# Patient Record
Sex: Male | Born: 1935 | Race: White | Hispanic: No | Marital: Married | Smoking: Former smoker
Health system: Southern US, Community
[De-identification: ages and names within clinical notes are randomized; demographics above are authoritative.]

## PROBLEM LIST (undated history)

## (undated) DIAGNOSIS — G43909 Migraine, unspecified, not intractable, without status migrainosus: Secondary | ICD-10-CM

## (undated) DIAGNOSIS — I350 Nonrheumatic aortic (valve) stenosis: Secondary | ICD-10-CM

## (undated) DIAGNOSIS — I4719 Other supraventricular tachycardia: Secondary | ICD-10-CM

## (undated) DIAGNOSIS — K219 Gastro-esophageal reflux disease without esophagitis: Secondary | ICD-10-CM

## (undated) DIAGNOSIS — I472 Ventricular tachycardia: Secondary | ICD-10-CM

## (undated) DIAGNOSIS — F32A Depression, unspecified: Secondary | ICD-10-CM

## (undated) DIAGNOSIS — I10 Essential (primary) hypertension: Secondary | ICD-10-CM

## (undated) DIAGNOSIS — I701 Atherosclerosis of renal artery: Secondary | ICD-10-CM

## (undated) DIAGNOSIS — N529 Male erectile dysfunction, unspecified: Secondary | ICD-10-CM

## (undated) DIAGNOSIS — M79606 Pain in leg, unspecified: Secondary | ICD-10-CM

## (undated) DIAGNOSIS — E1140 Type 2 diabetes mellitus with diabetic neuropathy, unspecified: Secondary | ICD-10-CM

## (undated) DIAGNOSIS — F329 Major depressive disorder, single episode, unspecified: Secondary | ICD-10-CM

## (undated) DIAGNOSIS — F419 Anxiety disorder, unspecified: Secondary | ICD-10-CM

## (undated) DIAGNOSIS — I4510 Unspecified right bundle-branch block: Secondary | ICD-10-CM

## (undated) DIAGNOSIS — Z8719 Personal history of other diseases of the digestive system: Secondary | ICD-10-CM

## (undated) DIAGNOSIS — I4729 Other ventricular tachycardia: Secondary | ICD-10-CM

## (undated) DIAGNOSIS — J449 Chronic obstructive pulmonary disease, unspecified: Secondary | ICD-10-CM

## (undated) DIAGNOSIS — F319 Bipolar disorder, unspecified: Secondary | ICD-10-CM

## (undated) DIAGNOSIS — I471 Supraventricular tachycardia: Secondary | ICD-10-CM

## (undated) DIAGNOSIS — E039 Hypothyroidism, unspecified: Secondary | ICD-10-CM

## (undated) DIAGNOSIS — I5032 Chronic diastolic (congestive) heart failure: Secondary | ICD-10-CM

## (undated) DIAGNOSIS — E785 Hyperlipidemia, unspecified: Secondary | ICD-10-CM

## (undated) DIAGNOSIS — I739 Peripheral vascular disease, unspecified: Secondary | ICD-10-CM

## (undated) DIAGNOSIS — M1990 Unspecified osteoarthritis, unspecified site: Secondary | ICD-10-CM

## (undated) DIAGNOSIS — N183 Chronic kidney disease, stage 3 unspecified: Secondary | ICD-10-CM

## (undated) DIAGNOSIS — I872 Venous insufficiency (chronic) (peripheral): Secondary | ICD-10-CM

## (undated) HISTORY — PX: BLEPHAROPLASTY: SUR158

## (undated) HISTORY — DX: Hyperlipidemia, unspecified: E78.5

## (undated) HISTORY — DX: Male erectile dysfunction, unspecified: N52.9

## (undated) HISTORY — PX: CATARACT EXTRACTION, BILATERAL: SHX1313

## (undated) HISTORY — PX: RENAL ARTERY STENT: SHX2321

## (undated) HISTORY — DX: Essential (primary) hypertension: I10

## (undated) HISTORY — DX: Peripheral vascular disease, unspecified: I73.9

## (undated) HISTORY — DX: Major depressive disorder, single episode, unspecified: F32.9

## (undated) HISTORY — DX: Chronic obstructive pulmonary disease, unspecified: J44.9

## (undated) HISTORY — DX: Depression, unspecified: F32.A

## (undated) HISTORY — DX: Gastro-esophageal reflux disease without esophagitis: K21.9

## (undated) HISTORY — DX: Pain in leg, unspecified: M79.606

## (undated) HISTORY — PX: TONSILLECTOMY: SUR1361

---

## 1987-12-20 HISTORY — PX: KIDNEY STONE SURGERY: SHX686

## 1998-05-28 ENCOUNTER — Ambulatory Visit: Admission: RE | Admit: 1998-05-28 | Discharge: 1998-05-28 | Payer: Self-pay | Admitting: Internal Medicine

## 2002-03-21 ENCOUNTER — Encounter: Admission: RE | Admit: 2002-03-21 | Discharge: 2002-03-21 | Payer: Self-pay | Admitting: Internal Medicine

## 2002-03-21 ENCOUNTER — Encounter: Payer: Self-pay | Admitting: Internal Medicine

## 2003-04-01 ENCOUNTER — Encounter: Admission: RE | Admit: 2003-04-01 | Discharge: 2003-04-01 | Payer: Self-pay | Admitting: Internal Medicine

## 2003-04-01 ENCOUNTER — Encounter: Payer: Self-pay | Admitting: Internal Medicine

## 2005-05-26 ENCOUNTER — Encounter: Admission: RE | Admit: 2005-05-26 | Discharge: 2005-05-26 | Payer: Self-pay | Admitting: Internal Medicine

## 2005-06-14 ENCOUNTER — Encounter: Admission: RE | Admit: 2005-06-14 | Discharge: 2005-06-14 | Payer: Self-pay | Admitting: Internal Medicine

## 2005-06-23 ENCOUNTER — Ambulatory Visit (HOSPITAL_COMMUNITY): Admission: RE | Admit: 2005-06-23 | Discharge: 2005-06-24 | Payer: Self-pay | Admitting: Internal Medicine

## 2005-10-14 ENCOUNTER — Ambulatory Visit: Payer: Self-pay | Admitting: Internal Medicine

## 2005-11-02 ENCOUNTER — Ambulatory Visit: Payer: Self-pay | Admitting: Internal Medicine

## 2005-11-02 ENCOUNTER — Encounter (INDEPENDENT_AMBULATORY_CARE_PROVIDER_SITE_OTHER): Payer: Self-pay | Admitting: *Deleted

## 2005-11-02 DIAGNOSIS — D126 Benign neoplasm of colon, unspecified: Secondary | ICD-10-CM

## 2005-11-02 DIAGNOSIS — K219 Gastro-esophageal reflux disease without esophagitis: Secondary | ICD-10-CM | POA: Insufficient documentation

## 2005-11-02 DIAGNOSIS — K5730 Diverticulosis of large intestine without perforation or abscess without bleeding: Secondary | ICD-10-CM | POA: Insufficient documentation

## 2005-11-02 DIAGNOSIS — K6389 Other specified diseases of intestine: Secondary | ICD-10-CM | POA: Insufficient documentation

## 2005-11-22 ENCOUNTER — Ambulatory Visit: Payer: Self-pay | Admitting: Internal Medicine

## 2005-11-22 ENCOUNTER — Ambulatory Visit (HOSPITAL_COMMUNITY): Admission: RE | Admit: 2005-11-22 | Discharge: 2005-11-22 | Payer: Self-pay | Admitting: Internal Medicine

## 2006-04-04 ENCOUNTER — Encounter: Admission: RE | Admit: 2006-04-04 | Discharge: 2006-04-04 | Payer: Self-pay | Admitting: Interventional Radiology

## 2006-08-10 ENCOUNTER — Encounter: Admission: RE | Admit: 2006-08-10 | Discharge: 2006-08-10 | Payer: Self-pay | Admitting: Interventional Radiology

## 2007-02-28 ENCOUNTER — Encounter: Admission: RE | Admit: 2007-02-28 | Discharge: 2007-02-28 | Payer: Self-pay | Admitting: Interventional Radiology

## 2007-07-11 DIAGNOSIS — I7090 Unspecified atherosclerosis: Secondary | ICD-10-CM | POA: Insufficient documentation

## 2007-07-11 DIAGNOSIS — N269 Renal sclerosis, unspecified: Secondary | ICD-10-CM

## 2007-07-11 DIAGNOSIS — K4020 Bilateral inguinal hernia, without obstruction or gangrene, not specified as recurrent: Secondary | ICD-10-CM | POA: Insufficient documentation

## 2007-07-11 DIAGNOSIS — K8020 Calculus of gallbladder without cholecystitis without obstruction: Secondary | ICD-10-CM | POA: Insufficient documentation

## 2007-07-11 DIAGNOSIS — K429 Umbilical hernia without obstruction or gangrene: Secondary | ICD-10-CM | POA: Insufficient documentation

## 2007-07-24 ENCOUNTER — Ambulatory Visit: Payer: Self-pay | Admitting: Internal Medicine

## 2007-08-03 ENCOUNTER — Ambulatory Visit (HOSPITAL_COMMUNITY): Admission: RE | Admit: 2007-08-03 | Discharge: 2007-08-03 | Payer: Self-pay | Admitting: Internal Medicine

## 2007-08-15 ENCOUNTER — Ambulatory Visit: Payer: Self-pay | Admitting: Internal Medicine

## 2007-10-03 ENCOUNTER — Ambulatory Visit: Payer: Self-pay | Admitting: Internal Medicine

## 2008-02-21 DIAGNOSIS — K2289 Other specified disease of esophagus: Secondary | ICD-10-CM | POA: Insufficient documentation

## 2008-02-21 DIAGNOSIS — G4730 Sleep apnea, unspecified: Secondary | ICD-10-CM | POA: Insufficient documentation

## 2008-02-21 DIAGNOSIS — J449 Chronic obstructive pulmonary disease, unspecified: Secondary | ICD-10-CM

## 2008-02-21 DIAGNOSIS — I00 Rheumatic fever without heart involvement: Secondary | ICD-10-CM | POA: Insufficient documentation

## 2008-02-21 DIAGNOSIS — K228 Other specified diseases of esophagus: Secondary | ICD-10-CM

## 2008-02-21 DIAGNOSIS — F341 Dysthymic disorder: Secondary | ICD-10-CM

## 2008-02-21 DIAGNOSIS — I110 Hypertensive heart disease with heart failure: Secondary | ICD-10-CM

## 2008-02-21 DIAGNOSIS — K224 Dyskinesia of esophagus: Secondary | ICD-10-CM

## 2008-02-21 DIAGNOSIS — E785 Hyperlipidemia, unspecified: Secondary | ICD-10-CM

## 2008-02-21 DIAGNOSIS — E039 Hypothyroidism, unspecified: Secondary | ICD-10-CM

## 2008-03-11 ENCOUNTER — Encounter: Admission: RE | Admit: 2008-03-11 | Discharge: 2008-03-11 | Payer: Self-pay | Admitting: Interventional Radiology

## 2009-03-31 ENCOUNTER — Ambulatory Visit: Payer: Self-pay | Admitting: *Deleted

## 2010-07-09 ENCOUNTER — Encounter: Admission: RE | Admit: 2010-07-09 | Discharge: 2010-07-09 | Payer: Self-pay | Admitting: Specialist

## 2010-07-09 ENCOUNTER — Ambulatory Visit (HOSPITAL_COMMUNITY): Admission: RE | Admit: 2010-07-09 | Discharge: 2010-07-09 | Payer: Self-pay | Admitting: Specialist

## 2010-09-23 ENCOUNTER — Encounter: Payer: Self-pay | Admitting: Internal Medicine

## 2010-10-04 ENCOUNTER — Ambulatory Visit (HOSPITAL_BASED_OUTPATIENT_CLINIC_OR_DEPARTMENT_OTHER): Admission: RE | Admit: 2010-10-04 | Discharge: 2010-10-04 | Payer: Self-pay | Admitting: Specialist

## 2011-01-08 ENCOUNTER — Encounter: Payer: Self-pay | Admitting: Orthopedic Surgery

## 2011-01-20 NOTE — Letter (Signed)
Summary: Colonoscopy Letter  [facility]  [address] [place], [place] [address]   Phone: [phone]  Fax: [phone]      September 23, 2010 MRN: [id number]   [name] [address] [place], [place]  [address]   Dear [name],   According to your medical record, it is time for you to schedule a Colonoscopy. The American Cancer Society recommends this procedure as a method to detect early colon cancer. Patients with a family history of colon cancer, or a personal history of colon polyps or inflammatory bowel disease are at increased risk.  This letter has been generated based on the recommendations made at the time of your procedure. If you feel that in your particular situation this may no longer apply, please contact our office.  Please call our office at [phone] to schedule this appointment or to update your records at your earliest convenience.  Thank you for cooperating with [place] to provide you with the very best care possible.   Sincerely,  Wilhemina Bonito. Marina Goodell, M.D.  [facility] [phone]

## 2011-02-22 ENCOUNTER — Encounter (INDEPENDENT_AMBULATORY_CARE_PROVIDER_SITE_OTHER): Payer: Medicare Other | Admitting: Vascular Surgery

## 2011-02-22 DIAGNOSIS — I70219 Atherosclerosis of native arteries of extremities with intermittent claudication, unspecified extremity: Secondary | ICD-10-CM

## 2011-02-23 NOTE — Consult Note (Signed)
NEW PATIENT CONSULTATION  [name], [name] DOB:  16-Jul-1929                                       02/22/2011 NGEXB#:[id number]  The patient presents today for evaluation of lower extremity arterial insufficiency.  He is an 76 year old gentleman with a long history of progressive lower extremity arterial insufficiency.  He reports that this is mainly in his calves.  He does have significant respiratory symptoms with shortness of breath with exertion.  He reports that his breathing difficulty becomes manifest about the same time as his lower extremity walking problems.  He, unfortunately, continues to smoke one pack of cigarettes per day.  He does not have any tissue loss in his lower extremities.  He does have history of prior renal artery stenting.  PAST MEDICAL HISTORY:  Significant for hypertension, COPD, hypothyroidism.  FAMILY HISTORY:  Negative for premature atherosclerotic disease.  SOCIAL HISTORY:  He does smoke a pack cigarettes a day but he does not drink alcohol.  He is married with one child.  He is retired.  REVIEW OF SYSTEMS:  CONSTITUTIONAL:  No weight loss.  He weighs 212 pounds.  He is 6 feet tall. VASCULAR:  Positive for pain in his legs with walking and lying flat. CARDIAC: Shortness of breath with exertion. GI:  Reflux, constipation. NEUROLOGIC:  Dizziness. PULMONARY:  Productive cough and wheezing. ENT:  Diminished hearing. PSYCHIATRIC:  Depression. SKIN:  Notable for rashes on his face.  PHYSICAL EXAMINATION:  A well-developed, well-nourished white male appearing stated age, in no acute distress.  Blood pressure is 132/78, pulse 82, respirations 12.  HEENT is normal.  Chest:  Clear bilaterally without wheezes.  He does have 2+ radial pulses.  He does have palpable femoral pulses and absent popliteal and distal pulses.  Abdomen: Moderately obese.  No masses are noted.  Musculoskeletal:  No major deformity or cyanosis.  Neurologic:   No focal weakness or paresthesias. Skin:  Without ulcers or rashes.  I did review his noninvasive vascular laboratory studies with him, which show moderate stenoses at 0.6 bilaterally.  I discussed this at length with the patient and his wife present.  I explained that this is not limb-threatening.  I do not feel that his resting pain and feet burning sensation is related to arterial insufficiency.  He does have a __________ disease and appears to be he is limited by his breathing as he is his claudication; therefore, we would recommend no active treatment.  I plan to see him again on an as-needed basis.  He will notify us should he develop any new difficulties.    Larina Earthly, M.D. Electronically Signed  TFE/MEDQ  D:  02/22/2011  T:  02/23/2011  Job:  [id number]  cc:   Gwen Pounds, MD

## 2011-03-02 LAB — DIFFERENTIAL
Basophils Absolute: 0 10*3/uL (ref 0.0–0.1)
Basophils Relative: 0 % (ref 0–1)
Eosinophils Absolute: 0.4 10*3/uL (ref 0.0–0.7)
Eosinophils Relative: 4 % (ref 0–5)
Lymphocytes Relative: 21 % (ref 12–46)
Lymphs Abs: 1.9 10*3/uL (ref 0.7–4.0)
Monocytes Absolute: 0.6 10*3/uL (ref 0.1–1.0)
Monocytes Relative: 7 % (ref 3–12)
Neutro Abs: 6.4 10*3/uL (ref 1.7–7.7)
Neutrophils Relative %: 68 % (ref 43–77)

## 2011-03-02 LAB — BASIC METABOLIC PANEL
BUN: 16 mg/dL (ref 6–23)
CO2: 26 mEq/L (ref 19–32)
Calcium: 8.9 mg/dL (ref 8.4–10.5)
Chloride: 108 mEq/L (ref 96–112)
Creatinine, Ser: 1.53 mg/dL — ABNORMAL HIGH (ref 0.4–1.5)
GFR calc Af Amer: 53 mL/min — ABNORMAL LOW (ref >60)
GFR calc non Af Amer: 44 mL/min — ABNORMAL LOW (ref >60)
Glucose, Bld: 124 mg/dL — ABNORMAL HIGH (ref 70–99)
Potassium: 3.9 mEq/L (ref 3.5–5.1)
Sodium: 141 mEq/L (ref 135–145)

## 2011-03-02 LAB — CBC
HCT: 42.5 % (ref 39.0–52.0)
MCHC: 34.6 g/dL (ref 30.0–36.0)
WBC: 9.3 10*3/uL (ref 4.0–10.5)

## 2011-03-05 LAB — BASIC METABOLIC PANEL
BUN: 23 mg/dL (ref 6–23)
CO2: 29 mEq/L (ref 19–32)
Calcium: 9.8 mg/dL (ref 8.4–10.5)
Chloride: 103 mEq/L (ref 96–112)
Creatinine, Ser: 1.61 mg/dL — ABNORMAL HIGH (ref 0.4–1.5)
Glucose, Bld: 108 mg/dL — ABNORMAL HIGH (ref 70–99)

## 2011-03-05 LAB — DIFFERENTIAL
Monocytes Absolute: 1.1 10*3/uL — ABNORMAL HIGH (ref 0.1–1.0)
Monocytes Relative: 8 % (ref 3–12)
Neutro Abs: 10.1 10*3/uL — ABNORMAL HIGH (ref 1.7–7.7)

## 2011-03-05 LAB — CBC
Hemoglobin: 16.3 g/dL (ref 13.0–17.0)
MCH: 31.1 pg (ref 26.0–34.0)
RBC: 5.24 MIL/uL (ref 4.22–5.81)

## 2011-05-03 NOTE — Assessment & Plan Note (Signed)
[facility]                         GASTROENTEROLOGY OFFICE NOTE   NAME:[name], [name]                    MRN:          [id number]  DATE:10/03/2007                            DOB:          28-Feb-1929    HISTORY:  [name] presents today for followup.  He is a 77 year old  white male with a history of hypertension, hypothyroidism, dyslipidemia,  sleep apnea, gastroesophageal reflux disease complicated by peptic  stricture, and esophageal diverticula.  He was evaluated in the office  July 24, 2007 regarding dysphagia, weight loss and an abnormal CT scan.  See that dictation for details.  He underwent upper endoscopy and was  found to have two large epiphrenic diverticula at 38 cm from the  incisors.  These were impacted with considerable food debris.  At 42 cm  from the incisors was a 14 to 15 mm peptic stricture.  He also had a  small sliding hiatal hernia.  The remainder of his endoscopy was normal.  His distal stricture was subsequently dilated with an 18-mm Savary  dilator over a guidewire.  For symptoms of heartburn and indigestion, he  is on Aciphex.  He presents today for followup, as recommended.  Since  his dilation two months ago, he reports very little improvement in his  dysphagia.  Aciphex controls his heartburn and indigestion.  He requests  samples of Aciphex as well as a flu vaccine.  The patient has not had an  esophageal motility study or barium swallow.   CURRENT MEDICATIONS:  1. Aciphex.  2. Synthroid.  3. Carbidopa/levadopa.  4. Seroquel.  5. Klonopin.  6. Remeron.  7. B vitamin.  8. Co-enzyme Q10.  9. Multivitamin.  10.Omega-3.  11.Simvastatin.  12.Zoloft.  13.Baby aspirin.   PHYSICAL EXAMINATION:  A well-appearing male in no acute distress.  Blood pressure is 104/66, heart rate is initially 104 and regular,  repeat 90 and regular.  His weight is 191.8 pounds (no change).  The abdomen was not reexamined.   IMPRESSION:  1. Chronic dysphagia, multifactorial, including large epiphrenic      diverticula, distal esophageal stricture, and undefined motility      disorder.  Minimal response to 18-mm dilation.  2. Gastroesophageal reflux disease with heartburn, indigestion, and      peptic stricture.  Symptoms, other than dysphagia, controlled with      Aciphex.  3. Multiple general medical problems.   RECOMMENDATIONS:  At this point I would like to refer [name] to an  esophagologist, Dr. Alvia Grove, to further define the nature of  his problem and recommend treatment.  In particular, I imagine this  patient should have motility study to absolutely exclude achalasia or if  not feasible,an esophagram.  If achalasia found, then he could be  considered for pneumatic dilation of the distal esophagus.  However, if  not, then possible referral for surgical resection of the large  diverticula.  In any event, I look forward to Dr. Bryson Dames assessment  and impressions.  The patient states that he would like to wait until  the first of the year for this appointment.  This  is certainly  reasonable.We might perform the esophagram prior to the evaluation.  As  such, he will continue on Aciphex, additional samples have been  provided.  We will provide the influenza vaccine as requested.  He will  resume his general medical care with Dr. Timothy Lasso.     Wilhemina Bonito. Marina Goodell, MD  Electronically Signed    JNP/MedQ  DD: 10/03/2007  DT: 10/04/2007  Job #: [id number]   cc:   Gwen Pounds, MD  Ronda Fairly. Enrigue Catena, MD

## 2011-05-03 NOTE — Procedures (Signed)
DUPLEX DEEP VENOUS EXAM - LOWER EXTREMITY   INDICATION:  Edema, rule out DVT.   HISTORY:  Edema:  Right lower extremity swelling for over 1 year.  Trauma/Surgery:  No  Pain:  No  PE:  No  Previous DVT:  No  Anticoagulants:  Other:   DUPLEX EXAM:                CFV   SFV   PopV  PTV    GSV                R  L  R  L  R  L  R   L  R  L  Thrombosis    0  0  0     0     0      0  Spontaneous   +  +  +     +     +      +  Phasic        +  +  +     +     +      +  Augmentation  +  +  +     +     +      +  Compressible  +  +  +     +     +      +  Competent     0  0  0     0            +   Legend:  + - yes  o - no  p - partial  D - decreased   IMPRESSION:  1. No evidence of deep venous thrombosis noted in the right lower      extremity.  2. Deep venous reflux is noted, as described above.  3. A preliminary report was faxed to Dr. Ferd Hibbs office on March 31, 2009.    _____________________________  P. Liliane Bade, M.D.   CH/MEDQ  D:  03/31/2009  T:  03/31/2009  Job:  [id number]

## 2011-05-03 NOTE — Assessment & Plan Note (Signed)
[facility]                         GASTROENTEROLOGY OFFICE NOTE   [name]                    MRN:          [id number]  DATE:07/24/2007                            DOB:          October 27, 1929    REFERRING PHYSICIAN:  Gwen Pounds, MD   REASON FOR CONSULTATION:  Dysphagia, weight loss and abnormal CT scan.   HISTORY:  This is a 77 year old white male with a history of sleep  apnea, hypertension, hypothyroidism, dyslipidemia, anxiety with  depression, esophageal stricture, esophageal diverticulum, and kidney  stones.  He is referred through the courtesy of Dr. Timothy Lasso regarding  weight loss and dysphagia.  The patient was evaluated in this office in  October 2006 for dysphagia.  He subsequently underwent upper endoscopy  and was found to have a wide-mouthed traction diverticulum of the distal  esophagus, also a peptic stricture of the esophagus, esophagitis and  duodenitis.  He was negative for H. pylori.  He was placed on AcipHex  and subsequently set up for upper endoscopy with esophageal dilation to  a maximal diameter of 18 mm.  He cannot recall whether the dilation was  helpful.  He has had chronic problems with anorexia and has lost 45  pounds over the past 2 years.  His dysphagia is chronic, somewhat  worsening.  He recently had pneumonia and was treated with Augmentin.  His stools were generally chronically constipated, though since  antibiotic therapy, have been a bit loose; he welcomes the change.  Because of ongoing weight loss, a CT scan of the chest, abdomen and  pelvis was obtained July 11, 2007.  The significant findings were that  of circumferential esophageal wall thickening as well as retained  contrast in the esophagus, also mild adenopathy and gastric wall  thickening with luminal narrowing, also changes in the right lung  consistent with aspiration pneumonia and gallstones, also a small  umbilical hernia.  He is referred.   The patient denies GI bleeding or  abdominal pain.   PAST MEDICAL HISTORY:  As above.   PAST SURGICAL HISTORY:  Left renal stent placement.   ALLERGIES:  No known drug allergies.   CURRENT MEDICATIONS:  1. AcipHex 20 mg daily.  2. Synthroid 112 mcg daily.  3. Carbidopa/levadopa 10/100 mg at night.  4. Seroquel 200 mg at night and 50 mg during the day.  5. Klonopin 1 mg to 3 mg daily.  6. Remeron 45 mg at night.  7. Vitamin B.  8. Multivitamin.  9. Omega-3.  10.Vytorin 10 mg daily.  11.Vitamin C.  12.Glucosamine.  13.Vitamin D  14.Zoloft 50 mg daily.  15.Oxycodone at midnight.   FAMILY HISTORY:  Negative for gastrointestinal malignancy.   SOCIAL HISTORY:  As previously outlined, no change.   PHYSICAL EXAMINATION:  Elderly male in no acute distress.  Blood pressure is 90/72. Heart rate is 100 and regular.  Weight is 191.6  pounds (decreased 45 pounds since last visit).  HEENT:  Sclerae are anicteric.  Conjunctivae are pink.  Oral mucosa is  intact.  There is no obvious adenopathy.  LUNGS:  Clear.  HEART:  Regular.  ABDOMEN:  Soft without tenderness, mass or hernia.  Good bowel sounds  heard.  EXTREMITIES:  Without edema.   IMPRESSION:  1. Problems with dysphagia, likely multifactorial including      dysmotility, esophageal diverticulum, and distal stricture.  2. Gastroesophageal reflux disease.  3. Weight loss, likely the effect of anorexia and difficulty      swallowing.  4. Multiple general medical problems.   RECOMMENDATIONS:  1. Continue proton pump inhibitor therapy.  Multiple samples of      AcipHex have been given at their request.  2. Schedule upper endoscopy with esophageal dilation.  The nature of      the procedure as well as the risks, benefits and alternatives have      been reviewed.  They understood and agreed to proceed.  3. Ongoing general medical care with Dr. Timothy Lasso.     Wilhemina Bonito. Marina Goodell, MD  Electronically Signed    JNP/MedQ  DD:  07/24/2007  DT: 07/25/2007  Job #: [id number]

## 2011-05-23 ENCOUNTER — Encounter (HOSPITAL_BASED_OUTPATIENT_CLINIC_OR_DEPARTMENT_OTHER): Payer: Medicare Other | Attending: Internal Medicine

## 2011-05-23 DIAGNOSIS — K219 Gastro-esophageal reflux disease without esophagitis: Secondary | ICD-10-CM | POA: Insufficient documentation

## 2011-05-23 DIAGNOSIS — Z7982 Long term (current) use of aspirin: Secondary | ICD-10-CM | POA: Insufficient documentation

## 2011-05-23 DIAGNOSIS — J4489 Other specified chronic obstructive pulmonary disease: Secondary | ICD-10-CM | POA: Insufficient documentation

## 2011-05-23 DIAGNOSIS — L8992 Pressure ulcer of unspecified site, stage 2: Secondary | ICD-10-CM | POA: Insufficient documentation

## 2011-05-23 DIAGNOSIS — J449 Chronic obstructive pulmonary disease, unspecified: Secondary | ICD-10-CM | POA: Insufficient documentation

## 2011-05-23 DIAGNOSIS — E669 Obesity, unspecified: Secondary | ICD-10-CM | POA: Insufficient documentation

## 2011-05-23 DIAGNOSIS — Z79899 Other long term (current) drug therapy: Secondary | ICD-10-CM | POA: Insufficient documentation

## 2011-05-23 DIAGNOSIS — E785 Hyperlipidemia, unspecified: Secondary | ICD-10-CM | POA: Insufficient documentation

## 2011-05-23 DIAGNOSIS — G2581 Restless legs syndrome: Secondary | ICD-10-CM | POA: Insufficient documentation

## 2011-05-23 DIAGNOSIS — L89309 Pressure ulcer of unspecified buttock, unspecified stage: Secondary | ICD-10-CM | POA: Insufficient documentation

## 2011-05-23 DIAGNOSIS — F17200 Nicotine dependence, unspecified, uncomplicated: Secondary | ICD-10-CM | POA: Insufficient documentation

## 2011-05-24 NOTE — Assessment & Plan Note (Signed)
[facility]  NAME:  [name], MOTTERN NO.:  [id number]  MEDICAL RECORD NO.:  [id number]      DATE OF BIRTH:  February 03, 1929  PHYSICIAN:  Jonelle Sports. Sevier, M.D.  VISIT DATE:  05/23/2011                                  OFFICE VISIT   HISTORY:  This 77 year old white male previously known to me who comes, referred by Dr. Honor Loh, dermatologist, for assistance with management of stage II pressure ulcers on both buttocks.  The patient has a history of squamous cell carcinoma in the past and some other actinic skin changes, but nothing any deeper consequence.  He is obese and somewhat phlegmatic by nature but is fully ambulatory. Apparently, he is in the habit of sitting for extended periods of time, watching television, and things of that nature and he is also a smoker.  Without background history, he developed several weeks ago painful lesions on each side of his buttock in the parasacral area.  He had seen a dermatologist for these and had been treated with desonide and also nystatin ointments.  He has experienced little relief.  He does have a donut cushion on which he apparently has been sitting at home.  PAST MEDICAL HISTORY:  No hospitalizations, surgery only for his squamous cell cancer which was on the chest wall.  Other hospitalizations none.  ALLERGIES:  None known.  REGULAR MEDICATIONS:  Aspirin 81 mg a day, Zoloft, Klonopin, Seroquel, simvastatin, nystatin, Synthroid, melatonin, carbidopa, levodopa, and mirtazapine.  FAMILY HISTORY:  Not contributory.  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  He is physically relatively inactive and has been chronically obese.  REVIEW OF SYSTEMS:  The patient is a smoker as indicated above, smoking presently approximately 1/2-2/3rd pack of cigarettes per day and has chronic obstructive pulmonary disease.  He has had a  stent placed in 1 kidney because of partial blockage and also has a history of rheumatic fever with lots of edema of late and the degree of evaluation that is unclear to me.  He has restless legs syndrome, gastroesophageal reflux, tendency toward depression, hyperlipidemia, and he has been told that there is a 35% reduction in his peripheral circulation.  PHYSICAL EXAMINATION:  VITAL SIGNS:  Blood pressure is 110/71, pulse 78, respirations 20, temperature 97.9. GENERAL:  This is an obese slow reacting individual who appears perhaps a little younger than his stated age of 88. NECK:  His neck veins are flat.  His carotid pulses are without bruits. CHEST:  Clear. HEART:  Regular and I hear no definite murmur. ABDOMEN:  Obese but without organomegaly, masses, or tenderness. EXTREMITIES:  Showed extreme edema to the level above the knees.  He has no open skin lesions on his legs.  His pulses are not palpable through this edema. On his buttocks both sides adjacent to the sacral area at the beginning of the anal crease, he has a small lesions measuring approximately 1.5 x 0.8 cm on both sides with some surrounding erythema as well.  There is no evidence of gross infection.  IMPRESSION:  Buttock decubiti bilaterally in the gluteal fold, both stage II.  DISPOSITION: 1. It is discussed with the patient that he needs to reduce  or stop     the smoking. 2. It is recommended to go away with the donut cushion and get instead     an egg crate cushion or some equivalent there. 3. He is advised that he must stay off this area spending less time     sitting, should be up and about more or when he is down, he should     be on one side of the other.  The wounds, after cleansing today, are treated with an application of DuoDerm and the area more widely covered with Tegaderm to protect DuoDerm and keep it clean.  Followup visit will be here in 1 week.  He is advised to call us sooner should the  dressings come off or become significantly soiled.          ______________________________ Jonelle Sports Cheryll Cockayne, M.D.     RES/MEDQ  D:  05/23/2011  T:  05/24/2011  Job:  [id number]

## 2011-06-20 ENCOUNTER — Encounter (HOSPITAL_BASED_OUTPATIENT_CLINIC_OR_DEPARTMENT_OTHER): Payer: Medicare Other | Attending: Internal Medicine

## 2011-06-20 DIAGNOSIS — Z79899 Other long term (current) drug therapy: Secondary | ICD-10-CM | POA: Insufficient documentation

## 2011-06-20 DIAGNOSIS — J4489 Other specified chronic obstructive pulmonary disease: Secondary | ICD-10-CM | POA: Insufficient documentation

## 2011-06-20 DIAGNOSIS — K219 Gastro-esophageal reflux disease without esophagitis: Secondary | ICD-10-CM | POA: Insufficient documentation

## 2011-06-20 DIAGNOSIS — E669 Obesity, unspecified: Secondary | ICD-10-CM | POA: Insufficient documentation

## 2011-06-20 DIAGNOSIS — F17200 Nicotine dependence, unspecified, uncomplicated: Secondary | ICD-10-CM | POA: Insufficient documentation

## 2011-06-20 DIAGNOSIS — J449 Chronic obstructive pulmonary disease, unspecified: Secondary | ICD-10-CM | POA: Insufficient documentation

## 2011-06-20 DIAGNOSIS — E785 Hyperlipidemia, unspecified: Secondary | ICD-10-CM | POA: Insufficient documentation

## 2011-06-20 DIAGNOSIS — G2581 Restless legs syndrome: Secondary | ICD-10-CM | POA: Insufficient documentation

## 2011-06-20 DIAGNOSIS — L8992 Pressure ulcer of unspecified site, stage 2: Secondary | ICD-10-CM | POA: Insufficient documentation

## 2011-06-20 DIAGNOSIS — Z7982 Long term (current) use of aspirin: Secondary | ICD-10-CM | POA: Insufficient documentation

## 2011-06-20 DIAGNOSIS — L89309 Pressure ulcer of unspecified buttock, unspecified stage: Secondary | ICD-10-CM | POA: Insufficient documentation

## 2011-11-07 ENCOUNTER — Encounter: Payer: Self-pay | Admitting: Vascular Surgery

## 2011-11-08 ENCOUNTER — Other Ambulatory Visit: Payer: Self-pay

## 2011-11-08 DIAGNOSIS — I739 Peripheral vascular disease, unspecified: Secondary | ICD-10-CM

## 2011-11-21 ENCOUNTER — Encounter: Payer: Self-pay | Admitting: Vascular Surgery

## 2011-11-22 ENCOUNTER — Ambulatory Visit (INDEPENDENT_AMBULATORY_CARE_PROVIDER_SITE_OTHER): Payer: Medicare Other | Admitting: Vascular Surgery

## 2011-11-22 ENCOUNTER — Encounter: Payer: Self-pay | Admitting: Vascular Surgery

## 2011-11-22 ENCOUNTER — Other Ambulatory Visit (INDEPENDENT_AMBULATORY_CARE_PROVIDER_SITE_OTHER): Payer: Medicare Other | Admitting: *Deleted

## 2011-11-22 VITALS — BP 144/84 | HR 89 | Resp 16 | Ht 72.0 in | Wt 223.7 lb

## 2011-11-22 DIAGNOSIS — I739 Peripheral vascular disease, unspecified: Secondary | ICD-10-CM

## 2011-11-22 DIAGNOSIS — L98499 Non-pressure chronic ulcer of skin of other sites with unspecified severity: Secondary | ICD-10-CM

## 2011-11-22 NOTE — Progress Notes (Signed)
The patient is an 77 year old gentleman well known to our practice with a long history of peripheral vascular occlusive disease. He has not required any intervention this was stable claudication. He is slow down the last several years and does not walk to the level of claudication. He does have significant COPD and therefore is more limited to this and his lower extremity discomfort.  He has developed a thickened callus on the plantar surface of his right heel and has had several treatments with shaving of this area. We are sitting in today for further evaluation of his level of arterial insufficiency. He denies any rest pain and currently denies any claudication.  Past Medical History  Diagnosis Date  . Hyperlipidemia   . COPD (chronic obstructive pulmonary disease)   . Leg pain   . GERD (gastroesophageal reflux disease)   . Depression   . Dizziness   . Productive cough   . Thyroid disease   . Peripheral vascular disease   . Chronic kidney disease   . Hypertension   . ED (erectile dysfunction)     History  Substance Use Topics  . Smoking status: Current Everyday Smoker -- 1.0 packs/day for 65 years    Types: Cigarettes  . Smokeless tobacco: Not on file  . Alcohol Use: No    Family History  Problem Relation Age of Onset  . Other Mother     AAA  . Heart attack Father   . Alcohol abuse Father   . Diabetes Brother   . Coronary artery disease Brother   . Dementia Brother     No Known Allergies  Current outpatient prescriptions:aspirin EC 81 MG tablet, Take 81 mg by mouth daily.  , Disp: , Rfl: ;  carbidopa-levodopa (SINEMET) 10-100 MG per tablet, Take 1 tablet by mouth daily.  , Disp: , Rfl: ;  clonazePAM (KLONOPIN) 1 MG tablet, Take 0.5 mg by mouth 2 (two) times daily as needed. 1/2 tablet in AM and 2 tablets in PM., Disp: , Rfl: ;  levothyroxine (SYNTHROID, LEVOTHROID) 112 MCG tablet, Take 112 mcg by mouth daily.  , Disp: , Rfl:  Melatonin 3 MG CAPS, Take 3 mg by mouth daily.   , Disp: , Rfl: ;  mirtazapine (REMERON) 30 MG tablet, Take 30 mg by mouth at bedtime.  , Disp: , Rfl: ;  QUEtiapine (SEROQUEL) 25 MG tablet, Take 25 mg by mouth at bedtime.  , Disp: , Rfl: ;  sertraline (ZOLOFT) 100 MG tablet, Take 100 mg by mouth daily.  , Disp: , Rfl: ;  sildenafil (VIAGRA) 100 MG tablet, Take 100 mg by mouth daily as needed.  , Disp: , Rfl:  simvastatin (ZOCOR) 40 MG tablet, Take 40 mg by mouth at bedtime.  , Disp: , Rfl:   BP 144/84  Pulse 89  Resp 16  Ht 6' (1.829 m)  Wt 223 lb 11.2 oz (101.47 kg)  BMI 30.34 kg/m2  Body mass index is 30.34 kg/(m^2).       Review of systems: Shortness of breath with exertion, leg pain, productive cough and wheezing, history of depression otherwise negative  Physical exam: Well-developed well-nourished white male no acute distress HEENT normal. Carotid arteries a bruits bilaterally. Radial pulses 2+ bilaterally chest clear bilaterally. Palpable femoral pulses bilaterally. Absent popliteal and distal pulses bilaterally. He has no tissue loss of his lower Shoney's. Does have a thickened callus on the plantar aspect of his right heel.  Vascular lab: Biphasic waveforms bilaterally. Ankle arm index of  0.77 on the right and 0.64 on the left.  Impression and plan:Stable moderate lower extremity i arterial insufficiency, asymptomatic. Would recommend continued observation only. I feel that he has adequate flow for any treatment required regarding his right heel.

## 2011-12-07 ENCOUNTER — Other Ambulatory Visit: Payer: Medicare Other

## 2011-12-07 ENCOUNTER — Encounter: Payer: Medicare Other | Admitting: Vascular Surgery

## 2012-04-26 ENCOUNTER — Encounter (HOSPITAL_BASED_OUTPATIENT_CLINIC_OR_DEPARTMENT_OTHER): Payer: Medicare Other | Attending: Internal Medicine

## 2012-04-26 DIAGNOSIS — R21 Rash and other nonspecific skin eruption: Secondary | ICD-10-CM | POA: Insufficient documentation

## 2012-04-26 DIAGNOSIS — J449 Chronic obstructive pulmonary disease, unspecified: Secondary | ICD-10-CM | POA: Insufficient documentation

## 2012-04-26 DIAGNOSIS — Z79899 Other long term (current) drug therapy: Secondary | ICD-10-CM | POA: Insufficient documentation

## 2012-04-26 DIAGNOSIS — I10 Essential (primary) hypertension: Secondary | ICD-10-CM | POA: Insufficient documentation

## 2012-04-26 DIAGNOSIS — E039 Hypothyroidism, unspecified: Secondary | ICD-10-CM | POA: Insufficient documentation

## 2012-04-26 DIAGNOSIS — I739 Peripheral vascular disease, unspecified: Secondary | ICD-10-CM | POA: Insufficient documentation

## 2012-04-26 DIAGNOSIS — L538 Other specified erythematous conditions: Secondary | ICD-10-CM | POA: Insufficient documentation

## 2012-04-26 DIAGNOSIS — J4489 Other specified chronic obstructive pulmonary disease: Secondary | ICD-10-CM | POA: Insufficient documentation

## 2012-04-26 NOTE — Progress Notes (Signed)
[facility]  NAME:  Bruce, [name] NO.:  [id number]  MEDICAL RECORD NO.:  [id number]      DATE OF BIRTH:  November 20, 1929  PHYSICIAN:  Ardath Sax, M.D.           VISIT DATE:                                  OFFICE VISIT   [name], has been here in the past for the same reason.  He returns now.  He is an 78 year old gentleman who has peripheral vascular disease.  He has got COPD.  He has hypothyroidism.  He is also on Seroquel, simvastatin, melatonin, Advair Diskus.  He takes Lasix.  He is also on prednisone 20 mg a day and propranolol and carbidopa and levodopa.  He enters the [facility] again because of his strange rash he has on his right buttock which is about 2 inches from the anal verge. He has been seen by a dermatologist on several occasions.  He has had this on and off for about 3 years.  It has been treated in many different ways.  It is even been cultured with no growth and it has been biopsied which did not show any pathology.  When I look at the rash, now it is about 3 or 4 cm in diameter.  It is not really an ulcer it is desquamated skin and erythema and it is certainly dry and erythematous bolus.  Since, the only thing he has not done is go to Infectious Disease, we may very will send him there.  In the meantime, since it resembles some sort of fungal dermatitis, I put him on Mycolog cream, and we will see him back here in a week and decide whether to send him back to the dermatologist which has been following him right along or get an Infectious Disease consult.  Otherwise, this gentleman is afebrile.  He is relatively strong for an 78 year old man with so many different problems that he has which are COPD, hypertension, hypothyroidism, peripheral vascular disease, and his temperature is 98.6, his blood pressure is 140/84, his pulse is 70.  We put dressing on with Mycolog and I wrote him a prescription for Mycolog and  we will see him in a week.     Ardath Sax, M.D.     PP/MEDQ  D:  04/26/2012  T:  04/26/2012  Job:  [id number]

## 2012-05-31 ENCOUNTER — Encounter (HOSPITAL_BASED_OUTPATIENT_CLINIC_OR_DEPARTMENT_OTHER): Payer: Medicare Other

## 2012-08-17 ENCOUNTER — Emergency Department (HOSPITAL_COMMUNITY): Payer: Medicare Other

## 2012-08-17 ENCOUNTER — Encounter (HOSPITAL_COMMUNITY): Payer: Self-pay | Admitting: Emergency Medicine

## 2012-08-17 ENCOUNTER — Inpatient Hospital Stay (HOSPITAL_COMMUNITY)
Admission: EM | Admit: 2012-08-17 | Discharge: 2012-08-23 | DRG: 177 | Disposition: A | Discharge status: Home Health | Payer: Medicare Other | Attending: Internal Medicine | Admitting: Internal Medicine

## 2012-08-17 DIAGNOSIS — L89109 Pressure ulcer of unspecified part of back, unspecified stage: Secondary | ICD-10-CM | POA: Diagnosis present

## 2012-08-17 DIAGNOSIS — G2581 Restless legs syndrome: Secondary | ICD-10-CM | POA: Diagnosis present

## 2012-08-17 DIAGNOSIS — R5381 Other malaise: Secondary | ICD-10-CM | POA: Diagnosis present

## 2012-08-17 DIAGNOSIS — J441 Chronic obstructive pulmonary disease with (acute) exacerbation: Secondary | ICD-10-CM | POA: Diagnosis present

## 2012-08-17 DIAGNOSIS — N183 Chronic kidney disease, stage 3 unspecified: Secondary | ICD-10-CM | POA: Diagnosis present

## 2012-08-17 DIAGNOSIS — E46 Unspecified protein-calorie malnutrition: Secondary | ICD-10-CM | POA: Diagnosis present

## 2012-08-17 DIAGNOSIS — G8929 Other chronic pain: Secondary | ICD-10-CM | POA: Diagnosis present

## 2012-08-17 DIAGNOSIS — F411 Generalized anxiety disorder: Secondary | ICD-10-CM | POA: Diagnosis present

## 2012-08-17 DIAGNOSIS — G929 Unspecified toxic encephalopathy: Secondary | ICD-10-CM | POA: Diagnosis present

## 2012-08-17 DIAGNOSIS — K219 Gastro-esophageal reflux disease without esophagitis: Secondary | ICD-10-CM | POA: Diagnosis present

## 2012-08-17 DIAGNOSIS — L89159 Pressure ulcer of sacral region, unspecified stage: Secondary | ICD-10-CM | POA: Diagnosis present

## 2012-08-17 DIAGNOSIS — I129 Hypertensive chronic kidney disease with stage 1 through stage 4 chronic kidney disease, or unspecified chronic kidney disease: Secondary | ICD-10-CM | POA: Diagnosis present

## 2012-08-17 DIAGNOSIS — R0902 Hypoxemia: Secondary | ICD-10-CM | POA: Diagnosis present

## 2012-08-17 DIAGNOSIS — E039 Hypothyroidism, unspecified: Secondary | ICD-10-CM | POA: Diagnosis present

## 2012-08-17 DIAGNOSIS — G4700 Insomnia, unspecified: Secondary | ICD-10-CM | POA: Diagnosis present

## 2012-08-17 DIAGNOSIS — F17200 Nicotine dependence, unspecified, uncomplicated: Secondary | ICD-10-CM | POA: Diagnosis present

## 2012-08-17 DIAGNOSIS — J690 Pneumonitis due to inhalation of food and vomit: Principal | ICD-10-CM | POA: Diagnosis present

## 2012-08-17 DIAGNOSIS — G92 Toxic encephalopathy: Secondary | ICD-10-CM | POA: Diagnosis present

## 2012-08-17 DIAGNOSIS — F419 Anxiety disorder, unspecified: Secondary | ICD-10-CM | POA: Diagnosis present

## 2012-08-17 DIAGNOSIS — L8990 Pressure ulcer of unspecified site, unspecified stage: Secondary | ICD-10-CM | POA: Diagnosis present

## 2012-08-17 DIAGNOSIS — I110 Hypertensive heart disease with heart failure: Secondary | ICD-10-CM | POA: Diagnosis present

## 2012-08-17 LAB — RAPID URINE DRUG SCREEN, HOSP PERFORMED
Cocaine: NOT DETECTED
Opiates: NOT DETECTED
Tetrahydrocannabinol: NOT DETECTED

## 2012-08-17 LAB — COMPREHENSIVE METABOLIC PANEL
BUN: 28 mg/dL — ABNORMAL HIGH (ref 6–23)
Calcium: 9.8 mg/dL (ref 8.4–10.5)
Creatinine, Ser: 1.52 mg/dL — ABNORMAL HIGH (ref 0.50–1.35)
GFR calc Af Amer: 48 mL/min — ABNORMAL LOW (ref >90)
GFR calc non Af Amer: 41 mL/min — ABNORMAL LOW (ref >90)
Glucose, Bld: 170 mg/dL — ABNORMAL HIGH (ref 70–99)
Sodium: 138 mEq/L (ref 135–145)
Total Protein: 6.7 g/dL (ref 6.0–8.3)

## 2012-08-17 LAB — ETHANOL: Alcohol, Ethyl (B): <11 mg/dL (ref 0–11)

## 2012-08-17 LAB — CBC WITH DIFFERENTIAL/PLATELET
Basophils Relative: 0 % (ref 0–1)
Eosinophils Absolute: 0 10*3/uL (ref 0.0–0.7)
Hemoglobin: 14.1 g/dL (ref 13.0–17.0)
Lymphs Abs: 1 10*3/uL (ref 0.7–4.0)
MCH: 29.3 pg (ref 26.0–34.0)
MCHC: 33.3 g/dL (ref 30.0–36.0)
MCV: 87.8 fL (ref 78.0–100.0)
Monocytes Absolute: 0.8 10*3/uL (ref 0.1–1.0)
Neutro Abs: 18.6 10*3/uL — ABNORMAL HIGH (ref 1.7–7.7)
Neutrophils Relative %: 91 % — ABNORMAL HIGH (ref 43–77)

## 2012-08-17 LAB — URINALYSIS, ROUTINE W REFLEX MICROSCOPIC
Leukocytes, UA: NEGATIVE
Nitrite: NEGATIVE
Specific Gravity, Urine: 1.029 (ref 1.005–1.030)
Urobilinogen, UA: 0.2 mg/dL (ref 0.0–1.0)
pH: 5.5 (ref 5.0–8.0)

## 2012-08-17 LAB — URINE MICROSCOPIC-ADD ON

## 2012-08-17 MED ORDER — ACETAMINOPHEN 325 MG PO TABS: 650.0000 mg | ORAL_TABLET | Freq: Four times a day (QID) | ORAL | Status: DC | PRN

## 2012-08-17 MED ORDER — ONDANSETRON HCL 4 MG PO TABS: 4.0000 mg | ORAL_TABLET | Freq: Four times a day (QID) | ORAL | Status: DC | PRN

## 2012-08-17 MED ORDER — SODIUM CHLORIDE 0.9 % IV SOLN
INTRAVENOUS | Status: DC
  Administered 2012-08-17: 23:00:00 via INTRAVENOUS

## 2012-08-17 MED ORDER — SODIUM CHLORIDE 0.9 % IJ SOLN
3.0000 mL | Freq: Two times a day (BID) | INTRAMUSCULAR | Status: DC
  Administered 2012-08-17 – 2012-08-23 (×8): 3 mL via INTRAVENOUS

## 2012-08-17 MED ORDER — SODIUM CHLORIDE 0.9 % IV SOLN
3.0000 g | Freq: Once | INTRAVENOUS | Status: AC
  Administered 2012-08-17: 3 g via INTRAVENOUS
  Filled 2012-08-17: qty 3

## 2012-08-17 MED ORDER — MELATONIN 3 MG PO CAPS: 3.0000 mg | ORAL_CAPSULE | Freq: Every day | ORAL | Status: DC

## 2012-08-17 MED ORDER — ENOXAPARIN SODIUM 40 MG/0.4ML ~~LOC~~ SOLN
40.0000 mg | SUBCUTANEOUS | Status: DC
  Administered 2012-08-17 – 2012-08-22 (×6): 40 mg via SUBCUTANEOUS
  Filled 2012-08-17 (×7): qty 0.4

## 2012-08-17 MED ORDER — SERTRALINE HCL 100 MG PO TABS
100.0000 mg | ORAL_TABLET | Freq: Every day | ORAL | Status: DC
  Administered 2012-08-17 – 2012-08-23 (×7): 100 mg via ORAL
  Filled 2012-08-17 (×7): qty 1

## 2012-08-17 MED ORDER — IPRATROPIUM BROMIDE 0.02 % IN SOLN: 0.5000 mg | Freq: Four times a day (QID) | RESPIRATORY_TRACT | Status: DC

## 2012-08-17 MED ORDER — GABAPENTIN 300 MG PO CAPS
300.0000 mg | ORAL_CAPSULE | Freq: Three times a day (TID) | ORAL | Status: DC
  Administered 2012-08-17 – 2012-08-23 (×17): 300 mg via ORAL
  Filled 2012-08-17 (×20): qty 1

## 2012-08-17 MED ORDER — PIPERACILLIN-TAZOBACTAM 3.375 G IVPB 30 MIN
3.3750 g | Freq: Four times a day (QID) | INTRAVENOUS | Status: DC
  Administered 2012-08-18 (×2): 3.375 g via INTRAVENOUS
  Filled 2012-08-17 (×3): qty 50

## 2012-08-17 MED ORDER — MIRTAZAPINE 30 MG PO TABS
30.0000 mg | ORAL_TABLET | Freq: Every day | ORAL | Status: DC
  Administered 2012-08-17 – 2012-08-22 (×6): 30 mg via ORAL
  Filled 2012-08-17 (×7): qty 1

## 2012-08-17 MED ORDER — ALBUTEROL SULFATE (5 MG/ML) 0.5% IN NEBU
2.5000 mg | INHALATION_SOLUTION | Freq: Four times a day (QID) | RESPIRATORY_TRACT | Status: DC | PRN
  Administered 2012-08-17: 2.5 mg via RESPIRATORY_TRACT
  Filled 2012-08-17: qty 0.5

## 2012-08-17 MED ORDER — ONDANSETRON HCL 4 MG/2ML IJ SOLN
4.0000 mg | Freq: Four times a day (QID) | INTRAMUSCULAR | Status: DC | PRN
  Administered 2012-08-18: 4 mg via INTRAVENOUS
  Filled 2012-08-17: qty 2

## 2012-08-17 MED ORDER — LEVOTHYROXINE SODIUM 112 MCG PO TABS
112.0000 ug | ORAL_TABLET | Freq: Every day | ORAL | Status: DC
  Administered 2012-08-17 – 2012-08-23 (×6): 112 ug via ORAL
  Filled 2012-08-17 (×8): qty 1

## 2012-08-17 MED ORDER — ASPIRIN EC 81 MG PO TBEC
81.0000 mg | DELAYED_RELEASE_TABLET | Freq: Every day | ORAL | Status: DC
  Administered 2012-08-18 – 2012-08-23 (×6): 81 mg via ORAL
  Filled 2012-08-17 (×6): qty 1

## 2012-08-17 MED ORDER — ALBUTEROL SULFATE (5 MG/ML) 0.5% IN NEBU: 2.5000 mg | INHALATION_SOLUTION | Freq: Four times a day (QID) | RESPIRATORY_TRACT | Status: DC

## 2012-08-17 MED ORDER — CLONAZEPAM 1 MG PO TABS
1.0000 mg | ORAL_TABLET | Freq: Two times a day (BID) | ORAL | Status: DC | PRN
  Administered 2012-08-17 – 2012-08-20 (×3): 1 mg via ORAL
  Filled 2012-08-17 (×3): qty 1

## 2012-08-17 MED ORDER — MORPHINE SULFATE 2 MG/ML IJ SOLN
2.0000 mg | INTRAMUSCULAR | Status: DC | PRN
  Administered 2012-08-17: 2 mg via INTRAVENOUS
  Filled 2012-08-17: qty 1

## 2012-08-17 MED ORDER — ALBUTEROL SULFATE (5 MG/ML) 0.5% IN NEBU
2.5000 mg | INHALATION_SOLUTION | Freq: Four times a day (QID) | RESPIRATORY_TRACT | Status: DC
  Administered 2012-08-18 – 2012-08-23 (×20): 2.5 mg via RESPIRATORY_TRACT
  Filled 2012-08-17 (×20): qty 0.5

## 2012-08-17 MED ORDER — CARBIDOPA-LEVODOPA 10-100 MG PO TABS
1.0000 | ORAL_TABLET | Freq: Every day | ORAL | Status: DC
  Administered 2012-08-17 – 2012-08-18 (×2): 1 via ORAL
  Filled 2012-08-17 (×3): qty 1

## 2012-08-17 MED ORDER — GUAIFENESIN ER 600 MG PO TB12
600.0000 mg | ORAL_TABLET | Freq: Two times a day (BID) | ORAL | Status: DC
  Administered 2012-08-17 – 2012-08-23 (×12): 600 mg via ORAL
  Filled 2012-08-17 (×13): qty 1

## 2012-08-17 MED ORDER — IPRATROPIUM BROMIDE 0.02 % IN SOLN
0.5000 mg | Freq: Four times a day (QID) | RESPIRATORY_TRACT | Status: DC
  Administered 2012-08-18 – 2012-08-23 (×20): 0.5 mg via RESPIRATORY_TRACT
  Filled 2012-08-17 (×20): qty 2.5

## 2012-08-17 MED ORDER — SIMVASTATIN 40 MG PO TABS
40.0000 mg | ORAL_TABLET | Freq: Every day | ORAL | Status: DC
  Administered 2012-08-17 – 2012-08-22 (×6): 40 mg via ORAL
  Filled 2012-08-17 (×7): qty 1

## 2012-08-17 MED ORDER — ALUM & MAG HYDROXIDE-SIMETH 200-200-20 MG/5ML PO SUSP: 30.0000 mL | Freq: Four times a day (QID) | ORAL | Status: DC | PRN

## 2012-08-17 MED ORDER — METHYLPREDNISOLONE SODIUM SUCC 125 MG IJ SOLR
60.0000 mg | Freq: Three times a day (TID) | INTRAMUSCULAR | Status: DC
  Administered 2012-08-17 – 2012-08-19 (×5): 60 mg via INTRAVENOUS
  Filled 2012-08-17 (×8): qty 0.96

## 2012-08-17 MED ORDER — QUETIAPINE FUMARATE 25 MG PO TABS
25.0000 mg | ORAL_TABLET | Freq: Every day | ORAL | Status: DC
  Administered 2012-08-17 – 2012-08-22 (×6): 25 mg via ORAL
  Filled 2012-08-17 (×7): qty 1

## 2012-08-17 MED ORDER — DOCUSATE SODIUM 100 MG PO CAPS
100.0000 mg | ORAL_CAPSULE | Freq: Two times a day (BID) | ORAL | Status: DC
  Administered 2012-08-17 – 2012-08-23 (×12): 100 mg via ORAL
  Filled 2012-08-17 (×13): qty 1

## 2012-08-17 MED ORDER — ACETAMINOPHEN 650 MG RE SUPP: 650.0000 mg | Freq: Four times a day (QID) | RECTAL | Status: DC | PRN

## 2012-08-17 NOTE — Progress Notes (Signed)
PHARMACIST - PHYSICIAN ORDER COMMUNICATION  CONCERNING: P&T Medication Policy on Herbal Medications  DESCRIPTION:  This patient's order for:  Melatonin  has been noted.  This product(s) is classified as an "herbal" or natural product. Due to a lack of definitive safety studies or FDA approval, nonstandard manufacturing practices, plus the potential risk of unknown drug-drug interactions while on inpatient medications, the Pharmacy and Therapeutics Committee does not permit the use of "herbal" or natural products of this type within [facility].   ACTION TAKEN: The pharmacy department is unable to verify this order at this time and your patient has been informed of this safety policy. Please reevaluate patient's clinical condition at discharge and address if the herbal or natural product(s) should be resumed at that time.  Geoffry Paradise, PharmD, BCPS Pager: [phone] 9:06 PM Pharmacy #: [phone]

## 2012-08-17 NOTE — ED Notes (Signed)
Unable to perform I-Stat, mini lab not available at this time.

## 2012-08-17 NOTE — ED Provider Notes (Signed)
History     CSN: [id number]  Arrival date & time 08/17/12  1312   First MD Initiated Contact with Patient 08/17/12 1507      Chief Complaint  Patient presents with  . Drug Overdose    (Consider location/radiation/quality/duration/timing/severity/associated sxs/prior treatment) Patient is a 78 y.o. male presenting with Overdose. The history is provided by the patient and the spouse.  Drug Overdose   Patient here with decreased mental status x3 days. Patient has been increasingly taking his Seroquel every 2 hours for his chronic pain. No history of suicidal attempt or ideations. Patient is also taking Capoten. According to his wife, he has been compliant with the other medications except for Seroquel which he is taking in excess. No recent intakes of aspirin or Tylenol. No recent falls. Patient is arousable but has been very sleepy Past Medical History  Diagnosis Date  . Hyperlipidemia   . COPD (chronic obstructive pulmonary disease)   . Leg pain   . GERD (gastroesophageal reflux disease)   . Depression   . Dizziness   . Productive cough   . Thyroid disease   . Peripheral vascular disease   . Chronic kidney disease   . Hypertension   . ED (erectile dysfunction)     Past Surgical History  Procedure Date  . Renal artery stent     left    Family History  Problem Relation Age of Onset  . Other Mother     AAA  . Heart attack Father   . Alcohol abuse Father   . Diabetes Brother   . Coronary artery disease Brother   . Dementia Brother     History  Substance Use Topics  . Smoking status: Current Everyday Smoker -- 1.0 packs/day for 65 years    Types: Cigarettes  . Smokeless tobacco: Not on file  . Alcohol Use: No      Review of Systems  All other systems reviewed and are negative.    Allergies  Review of patient's allergies indicates no known allergies.  Home Medications   Current Outpatient Rx  Name Route Sig Dispense Refill  . ASPIRIN EC 81 MG PO  TBEC Oral Take 81 mg by mouth daily.     Marland Kitchen CARBIDOPA-LEVODOPA 10-100 MG PO TABS Oral Take 1 tablet by mouth daily.     Marland Kitchen CLONAZEPAM 1 MG PO TABS Oral Take 1-2 mg by mouth 2 (two) times daily as needed. Take 1 tablet in morning and lunch and 2 tablets at bedtime.    Marland Kitchen GABAPENTIN 300 MG PO CAPS Oral Take 300 mg by mouth 3 (three) times daily.    Marland Kitchen LEVOTHYROXINE SODIUM 112 MCG PO TABS Oral Take 112 mcg by mouth daily.     Marland Kitchen MELATONIN 3 MG PO CAPS Oral Take 3 mg by mouth daily.      Marland Kitchen MIRTAZAPINE 30 MG PO TABS Oral Take 30 mg by mouth at bedtime.      Marland Kitchen QUETIAPINE FUMARATE 25 MG PO TABS Oral Take 25 mg by mouth at bedtime.     . SERTRALINE HCL 100 MG PO TABS Oral Take 100 mg by mouth daily.      Marland Kitchen SIMVASTATIN 40 MG PO TABS Oral Take 40 mg by mouth at bedtime.       BP 139/77  Pulse 87  Temp 100.3 F (37.9 C) (Oral)  Resp 20  Ht 6' (1.829 m)  Wt 212 lb (96.163 kg)  BMI 28.75 kg/m2  SpO2 93%  Physical  Exam  Nursing note and vitals reviewed. Constitutional: He is oriented to person, place, and time. He appears well-developed and well-nourished. He appears lethargic.  Non-toxic appearance. No distress.  HENT:  Head: Normocephalic and atraumatic.  Eyes: Conjunctivae, EOM and lids are normal. Pupils are equal, round, and reactive to light.  Neck: Normal range of motion. Neck supple. No tracheal deviation present. No mass present.  Cardiovascular: Normal rate, regular rhythm and normal heart sounds.  Exam reveals no gallop.   No murmur heard. Pulmonary/Chest: Effort normal. No stridor. No respiratory distress. He has decreased breath sounds. He has no wheezes. He has rhonchi. He has no rales.  Abdominal: Soft. Normal appearance and bowel sounds are normal. He exhibits no distension. There is no tenderness. There is no rebound and no CVA tenderness.  Musculoskeletal: Normal range of motion. He exhibits no edema and no tenderness.       3+ pitting edema bi- laterally  Neurological: He is  oriented to person, place, and time. He has normal strength. He appears lethargic. No cranial nerve deficit or sensory deficit. GCS eye subscore is 4. GCS verbal subscore is 5. GCS motor subscore is 6.  Skin: Skin is warm and dry. No abrasion and no rash noted.  Psychiatric: His affect is blunt. His speech is delayed. He is slowed. He expresses no suicidal plans and no homicidal plans.    ED Course  Procedures (including critical care time)  Labs Reviewed  CBC WITH DIFFERENTIAL - Abnormal; Notable for the following:    WBC 20.4 (*)     Neutrophils Relative 91 (*)     Lymphocytes Relative 5 (*)     Neutro Abs 18.6 (*)     All other components within normal limits  ETHANOL  ACETAMINOPHEN LEVEL  SALICYLATE LEVEL  URINE RAPID DRUG SCREEN (HOSP PERFORMED)  COMPREHENSIVE METABOLIC PANEL  URINALYSIS, ROUTINE W REFLEX MICROSCOPIC   No results found.   No diagnosis found.    MDM   Date: 08/17/2012  Rate: 89  Rhythm: normal sinus rhythm  QRS Axis: normal  Intervals: normal  ST/T Wave abnormalities: normal and nonspecific ST changes  Conduction Disutrbances:right bundle branch block  Narrative Interpretation:   Old EKG Reviewed: unchanged  4:56 PM Patient with likely aspiration pneumonia based on x-ray and has coarse breath sounds. Also has leukocytosis will be started on Unasyn and admitted to medicine        Toy Baker, MD 08/17/12 [id number]

## 2012-08-17 NOTE — H&P (Signed)
PCP:   Gwen Pounds, MD   Chief Complaint:  Confusion, cough  HPI: [name] is an 78 year old white male with a history of COPD, chronic pain secondary to sacral ulcer, and generalized anxiety who presented to the emergency department with increased confusion. Patient's wife states that he has been very agitated over the past week has been taking Seroquel every 2 hours to try to combat this. She states that this source of his agitation is his chronic sacral pain secondary to eczema. He has been taking extra Neurontin as well trying to combat this pain. In addition, she has noticed an increasing cough with sputum production. He was treated about 3 weeks ago with a course of prednisone and Cefdinir for COPD exacerbation. He initially improved until about 3 days ago when his cough returned associated with increased wheezing and shortness of breath. He called Dr. Timothy Lasso and was placed on Cefdinir again without prednisone this time. His cough has not improved since that time. Today, the patient was continuing to take his Seroquel and Neurontin in his wife noticed that he was becoming increasingly somnolent and confused. She brought him to the emergency department where he was found have a probable left lower lobe infiltrate, likely secondary to aspiration. Since arrival emergency department his mental status has cleared significantly but is not back to baseline.  Review of Systems:  Review of Systems - limited by patients confusion. All systems reviewed with patient and are negative except in history of present illness with the following exceptions: Chronic sacral ulcer felt secondary to eczema per dermatology. Treated topically and through the wound clinic over the past 4 years without improvement. Chronic swelling in his legs. Thirsty today.  Past Medical History: Past Medical History  Diagnosis Date  . Hyperlipidemia   . COPD (chronic obstructive pulmonary disease)   . Leg pain   . GERD  (gastroesophageal reflux disease)   . Depression   . Dizziness   . Productive cough   . Thyroid disease   . Peripheral vascular disease   . Chronic kidney disease   . ED (erectile dysfunction)   . Hypertension     08/17/12 - wife denies  Migraine HA,  Positive Syphilis test (congenital) s/p PCN,  Rheumatic fever,  PAD-RAS s/p PCI 7/06,  Peripheral neuropathy,  COPD--Smoker,  GERD complicated by peptic stricture/ esophageal diverticula, Hyperlipidemia, Hypothyroidism, HTN, Depression, HOH, CRI/CKD, RLS, ED, Rosacea, Sq Cell Ca of L Ear--Dr Emily Filbert.  Nephrolithiasis, sleep apnea, PAD,  02/2008 MMSE 29/30.  Sacral Decub 05/2011 Moderate PAD noted. ABI .6    Past Surgical History  Procedure Date  . Renal artery stent     left  Right eye cataract blepharochalasis  left sides> right sides, causing obstruction of his superior visual field areas as well as laterally.  Upper lid blepharoplasties bilateral surgery 10/04/10 Dr Shon Hough    Medications: Prior to Admission medications   Medication Sig Start Date End Date Taking? Authorizing Provider  aspirin EC 81 MG tablet Take 81 mg by mouth daily.    Yes Historical Provider, MD  carbidopa-levodopa (SINEMET) 10-100 MG per tablet Take 1 tablet by mouth daily.    Yes Historical Provider, MD  clonazePAM (KLONOPIN) 1 MG tablet Take 1-2 mg by mouth 2 (two) times daily as needed. Take 1 tablet in morning and lunch and 2 tablets at bedtime.   Yes Historical Provider, MD  gabapentin (NEURONTIN) 300 MG capsule Take 300 mg by mouth 3 (three) times daily.   Yes Historical  Provider, MD  levothyroxine (SYNTHROID, LEVOTHROID) 112 MCG tablet Take 112 mcg by mouth daily.    Yes Historical Provider, MD  Melatonin 3 MG CAPS Take 3 mg by mouth daily.     Yes Historical Provider, MD  mirtazapine (REMERON) 30 MG tablet Take 30 mg by mouth at bedtime.     Yes Historical Provider, MD  QUEtiapine (SEROQUEL) 25 MG tablet Take 25 mg by mouth at bedtime.    Yes Historical  Provider, MD  sertraline (ZOLOFT) 100 MG tablet Take 100 mg by mouth daily.     Yes Historical Provider, MD  simvastatin (ZOCOR) 40 MG tablet Take 40 mg by mouth at bedtime.    Yes Historical Provider, MD    Allergies:  No Known Allergies  Social History:  reports that he has been smoking Cigarettes.  He has a 65 pack-year smoking history. He has never used smokeless tobacco. He reports that he does not drink alcohol or use illicit drugs. Pt is married and has 3 children and one grandchild.  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  Pt smokes one ppd off and on since 15 years.    Family History: Family History  Problem Relation Age of Onset  . Other Mother     AAA  . Heart attack Father   . Alcohol abuse Father   . Diabetes Brother   . Coronary artery disease Brother   . Dementia Brother    Pt's father is deceased and had DM and was an alcoholic.  Mother is deceased and had an AAA.  Brother has DM, CAD, Dementia and PD  Physical Exam: Filed Vitals:   08/17/12 1313 08/17/12 1317 08/17/12 1329 08/17/12 1508  BP:   142/64 139/77  Pulse:  97 93 87  Temp:  98.9 F (37.2 C) 100.3 F (37.9 C)   TempSrc:   Oral   Resp:  24  20  Height:  6' (1.829 m)    Weight:  96.163 kg (212 lb)    SpO2: 78% 92% 93% 93%   General appearance: Drowsy, slightly disoriented but appropriate with answers and follows commands Head: Normocephalic, without obvious abnormality, atraumatic Eyes: conjunctivae/corneas clear. PERRL, EOM's intact.  Nose: Nares normal. Septum midline. Mucosa normal. No drainage or sinus tenderness. Throat: lips, mucosa, and tongue normal; teeth and gums normal Neck: no adenopathy, no carotid bruit, no JVD and thyroid not enlarged, symmetric, no tenderness/mass/nodules Resp: Bilateral rhonchi in the bases one third of the way up with diffuse expiratory wheezes; deep rattling cough Cardio: regular rate and rhythm, S1, S2 normal, no murmur, click, rub or gallop GI: soft,  non-tender; bowel sounds normal; no masses,  no organomegaly Extremities: extremities normal, atraumatic, no cyanosis; 2+ bilateral lower extremity edema pitting Pulses: 2+ and symmetric Lymph nodes: Cervical adenopathy: no cervical lymphadenopathy Neurologic: Lightly disoriented. No focal deficits   Labs on Admission:   [facility] 08/17/12 1526  NA 138  K 4.6  CL 102  CO2 27  GLUCOSE 170*  BUN 28*  CREATININE 1.52*  CALCIUM 9.8  MG --  PHOS --    Basename 08/17/12 1526  AST 16  ALT 22  ALKPHOS 84  BILITOT 0.6  PROT 6.7  ALBUMIN 2.7*   No results found for this basename: LIPASE:2,AMYLASE:2 in the last 72 hours  Basename 08/17/12 1526  WBC 20.4*  NEUTROABS 18.6*  HGB 14.1  HCT 42.3  MCV 87.8  PLT 160   Urine drug screen negative except for benzodiazepines. Urinalysis is  negative except for 30 protein salicylates less than 2.0. Acetaminophen less than 15.0   Radiological Exams on Admission: Dg Chest Port 1 View  08/17/2012  *RADIOLOGY REPORT*  Clinical Data: Drug overdose.  Unresponsive.  COPD.  PORTABLE CHEST - 1 VIEW  Comparison: 07/09/2010  Findings: Airspace opacities present in both lung bases, left greater than right, with mild obscuration of left hemidiaphragm.  Cardiomegaly noted.  Atherosclerotic calcification in the aortic arch present.  Indistinct pulmonary vasculature mild interstitial accentuation noted in both lungs.  Thoracic spondylosis is present.  IMPRESSION:  1.  Interstitial accentuation in the lungs, slightly more confluent in the lung bases.  There is mild left basilar airspace opacity. The appearance could be due to drug reaction, edema, aspiration pneumonitis, or pneumonia. 2.  Mild cardiomegaly. 3.  Thoracic spondylosis.   Original Report Authenticated By: Dellia Cloud, M.D.    Orders placed during the hospital encounter of 08/17/12  . EKG 12-LEAD  . EKG 12-LEAD  . EKG 12-LEAD  . EKG 12-LEAD    Assessment/Plan Principal Problem: 1.  *Aspiration pneumonia- hypoxia, leukocytosis, low-grade fever, and pulmonary exam all consistent with pneumonia. Most likely secondary to aspiration in the setting of his depressed mental state. Given recent treatment with Ceftin and will broaden his antibiotic coverage to Zosyn to cover anaerobic organisms as well. Repeat chest x-ray tomorrow and follow clinically. Speech therapy evaluation to  Active Problems: 2. Toxic encephalopathy- secondary to excessive medications including Seroquel and Neurontin. We'll avoid excessive medicines although I think these can then ED continued his standard doses of these medicines to help with agitation and pain. 3. COPD with acute exacerbation- increased wheezing in the setting of his pneumonia. We'll treat with steroids, bronchodilators, and oxygen. 4. Sacral decubitus ulcer- chronic ulcer this been present for nearly 4 years. He does have peripheral vascular disease which may be contributing to the poor healing. We'll obtain a wound care consult. 5. Chronic pain- chronic pain in the sacral area related to his ulcer. Notcher the Neurontin is helping. We'll provide morphine during this hospitalization and consider other pain treatments with caution. 6. HYPOTHYROIDISM- continue Synthroid. 7. HYPERTENSION- continue medications 8. Anxiety disorder- continue current medicines per Dr. Jennelle Human. Consider inpatient psychiatry evaluation pending his mood fluctuations on a standard doses. I reinforced the Seroquel should not be taken as needed and excessive use may cause mental status changes as is the case currently.  9. Chronic kidney disease, stage III (moderate)- monitor renal function 10. Disposition-anticipate discharge to home in 3-4 days. We'll consult physical therapy, occupational therapy, and social work to assist with discharge planning.  [name],W DOUGLAS 08/17/2012, 5:54 PM

## 2012-08-17 NOTE — ED Notes (Signed)
WUJ:[id number]<JY> Expected date:08/17/12<BR> Expected time:12:47 PM<BR> Means of arrival:Ambulance<BR> Comments:<BR> Pt from home overmedicating.

## 2012-08-18 ENCOUNTER — Inpatient Hospital Stay (HOSPITAL_COMMUNITY): Payer: Medicare Other

## 2012-08-18 LAB — BASIC METABOLIC PANEL
BUN: 36 mg/dL — ABNORMAL HIGH (ref 6–23)
CO2: 28 mEq/L (ref 19–32)
GFR calc non Af Amer: 43 mL/min — ABNORMAL LOW (ref >90)
Glucose, Bld: 158 mg/dL — ABNORMAL HIGH (ref 70–99)
Potassium: 4.4 mEq/L (ref 3.5–5.1)

## 2012-08-18 LAB — CBC
HCT: 40.7 % (ref 39.0–52.0)
Hemoglobin: 13.5 g/dL (ref 13.0–17.0)
MCHC: 33.2 g/dL (ref 30.0–36.0)
RBC: 4.62 MIL/uL (ref 4.22–5.81)

## 2012-08-18 MED ORDER — CARBIDOPA-LEVODOPA 10-100 MG PO TABS
1.0000 | ORAL_TABLET | Freq: Every day | ORAL | Status: DC
  Administered 2012-08-19 – 2012-08-22 (×4): 1 via ORAL
  Filled 2012-08-18 (×5): qty 1

## 2012-08-18 MED ORDER — PIPERACILLIN-TAZOBACTAM 3.375 G IVPB
3.3750 g | Freq: Three times a day (TID) | INTRAVENOUS | Status: DC
  Administered 2012-08-18 – 2012-08-21 (×9): 3.375 g via INTRAVENOUS
  Filled 2012-08-18 (×11): qty 50

## 2012-08-18 NOTE — Evaluation (Signed)
Occupational Therapy Evaluation Patient Details Name: [name] MRN: [id number] DOB: 1929/04/27 Today's Date: 08/18/2012 Time: 1355-1430 OT Time Calculation (min): 35 min  OT Assessment / Plan / Recommendation Clinical Impression  Pt admitted with PNA, metabolic encephalopathy, and excess use of seroquel.  Pt has a hx of COPD, PNA, and eczema on buttocks.  He is confused, intermittently lethargic, and has generalized weakness contributing to functional status described below.  Pt will benefit from OT to address ADL and mobility for self care to facilitate return home with wife.    OT Assessment  Patient needs continued OT Services    Follow Up Recommendations  Home health OT;Supervision/Assistance - 24 hour    Barriers to Discharge      Equipment Recommendations  Rolling walker with 5" wheels;3 in 1 bedside comode    Recommendations for Other Services    Frequency  Min 2X/week    Precautions / Restrictions Precautions Precautions: Fall Precaution Comments: monitor O2 sats, pt does not have 02 at home, smokes Restrictions Weight Bearing Restrictions: No   Pertinent Vitals/Pain     ADL  Eating/Feeding: Performed;Set up Where Assessed - Eating/Feeding: Edge of bed Grooming: Simulated;Wash/dry hands;Wash/dry face;Set up Where Assessed - Grooming: Unsupported sitting Upper Body Bathing: Simulated;Minimal assistance Where Assessed - Upper Body Bathing: Unsupported sitting Lower Body Bathing: Simulated;Moderate assistance Where Assessed - Lower Body Bathing: Supported sit to stand Upper Body Dressing: Performed;Minimal assistance Where Assessed - Upper Body Dressing: Unsupported sitting Lower Body Dressing: Performed;Moderate assistance Where Assessed - Lower Body Dressing: Supported sit to stand Equipment Used: Rolling walker;Gait belt Transfers/Ambulation Related to ADLs: min assist ADL Comments: Pt is generally weak. Coughs with activity.      OT Diagnosis:  Generalized weakness;Acute pain;Cognitive deficits  OT Problem List: Decreased strength;Decreased activity tolerance;Impaired balance (sitting and/or standing);Decreased knowledge of use of DME or AE;Obesity;Pain;Cardiopulmonary status limiting activity;Decreased cognition;Decreased safety awareness OT Treatment Interventions: Self-care/ADL training;DME and/or AE instruction;Cognitive remediation/compensation;Patient/family education;Balance training   OT Goals Acute Rehab OT Goals OT Goal Formulation: With patient Time For Goal Achievement: 08/25/12 Potential to Achieve Goals: Good ADL Goals Pt Will Perform Grooming: with supervision;Standing at sink ADL Goal: Grooming - Progress: Goal set today Pt Will Perform Upper Body Bathing: with supervision;Sitting, edge of bed ADL Goal: Upper Body Bathing - Progress: Goal set today Pt Will Perform Lower Body Bathing: with supervision;Sit to stand from bed ADL Goal: Lower Body Bathing - Progress: Goal set today Pt Will Perform Upper Body Dressing: with supervision;Sitting, bed ADL Goal: Upper Body Dressing - Progress: Goal set today Pt Will Perform Lower Body Dressing: with supervision;Sit to stand from bed ADL Goal: Lower Body Dressing - Progress: Goal set today Pt Will Transfer to Toilet: with supervision;with DME;Ambulation;Comfort height toilet ADL Goal: Toilet Transfer - Progress: Goal set today Pt Will Perform Toileting - Clothing Manipulation: with supervision;Standing ADL Goal: Toileting - Clothing Manipulation - Progress: Goal set today Pt Will Perform Toileting - Hygiene: with supervision;Sitting on 3-in-1 or toilet ADL Goal: Toileting - Hygiene - Progress: Goal set today Pt Will Perform Tub/Shower Transfer: Shower transfer;Ambulation;with DME ADL Goal: Tub/Shower Transfer - Progress: Goal set today Miscellaneous OT Goals Miscellaneous OT Goal #1: Pt will use environmental cues to orient to place and time. OT Goal: Miscellaneous Goal  #1 - Progress: Goal set today  Visit Information  Last OT Received On: 08/18/12 Assistance Needed: +1    Subjective Data  Subjective: "I want some chocolate ice cream." Patient Stated Goal: Go home.  Prior Functioning  Vision/Perception  Home Living Lives With: Spouse;Son Available Help at Discharge: Family;Available 24 hours/day Type of Home: House Home Access: Stairs to enter [employer] of Steps: 4 Entrance Stairs-Rails: Right;Left;Can reach both Home Layout: One level;Other (Comment) (has one step down into a den-no rail) Bathroom Shower/Tub: Walk-in shower;Tub/shower unit;Other (comment) (pt uses tub/shower and stands) Dentist: Grab bars in shower;Straight cane Additional Comments: PLOF and home situation via wife Prior Function Level of Independence: Independent Able to Take Stairs?: Yes Driving: No (his feet swell and he cannot feel the pedals per wife) 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 Safety/Judgement: Decreased awareness of safety precautions;Decreased awareness of need for assistance Awareness of Errors: Assistance required to identify errors made    Extremity/Trunk Assessment Right Upper Extremity Assessment RUE ROM/Strength/Tone: Within functional levels Left Upper Extremity Assessment LUE ROM/Strength/Tone: Within functional levels   Mobility  Shoulder Instructions  Bed Mobility Bed Mobility: Supine to Sit;Sitting - Scoot to  Edge of Bed;Scooting to [facility] Supine to Sit: 6: Modified independent (Device/Increase time);With rails;HOB elevated Sitting - Scoot to Edge of Bed: 6: Modified independent (Device/Increase time);With rail Scooting to [facility]: 6: Modified independent (Device/Increase time) Details for Bed Mobility Assistance: heavy reliance on rail to get to sitting Transfers Transfers: Sit to Stand;Stand to Sit Sit to Stand: 4: Min assist;From elevated surface;With upper extremity assist;From bed Stand to Sit: 4: Min guard;With upper extremity assist;To elevated surface;To bed Details for Transfer Assistance: used momentum for sit to stand       Exercise     Balance     End of Session OT - End of Session Activity Tolerance: Patient limited by fatigue Patient left: in bed;with call bell/phone within reach;with family/visitor present  GO     Evern Bio 08/18/2012, 3:35 PM [phone]

## 2012-08-18 NOTE — Progress Notes (Signed)
Subjective: Drowsy this am- received MSO4 and Klonopin at 22:30.  Will awaken and answer questions and then drifts back to sleep.  No specific complaints.  Objective: Vital signs in last 24 hours: Temp:  [97.6 F (36.4 C)-100.3 F (37.9 C)] 97.6 F (36.4 C) (08/31 0545) Pulse Rate:  [42-97] 73  (08/31 0545) Resp:  [18-24] 22  (08/31 0545) BP: (113-158)/(48-77) 113/59 mmHg (08/31 0545) SpO2:  [78 %-93 %] 92 % (08/31 0545) Weight:  [96.163 kg (212 lb)] 96.163 kg (212 lb) (08/30 1317) Weight change:  Last BM Date: 08/15/12  CBG (last 3)  No results found for this basename: GLUCAP:3 in the last 72 hours  Intake/Output from previous day: 08/30 0701 - 08/31 0700 In: 156.7 [I.V.:106.7; IV Piggyback:50] Out: 725 [Urine:725] Intake/Output this shift: Total I/O In: 156.7 [I.V.:106.7; IV Piggyback:50] Out: 725 [Urine:725]  General appearance: drowsy- awakens briefly to answer questions Eyes: no scleral icterus Throat: oropharynx moist without erythema Resp: minimal bilateral expiratory wheezes improved Cardio: regular rate and rhythm GI: soft, non-tender; bowel sounds normal; no masses,  no organomegaly Extremities: no clubbing, cyanosis; pitting edema slightly improved  Lab Results:  Basename 08/17/12 1526  NA 138  K 4.6  CL 102  CO2 27  GLUCOSE 170*  BUN 28*  CREATININE 1.52*  CALCIUM 9.8  MG --  PHOS --    Basename 08/17/12 1526  AST 16  ALT 22  ALKPHOS 84  BILITOT 0.6  PROT 6.7  ALBUMIN 2.7*    Basename 08/18/12 0601 08/17/12 1526  WBC 15.9* 20.4*  NEUTROABS -- 18.6*  HGB 13.5 14.1  HCT 40.7 42.3  MCV 88.1 87.8  PLT 168 160   No results found for this basename: INR, PROTIME   No results found for this basename: CKTOTAL:3,CKMB:3,CKMBINDEX:3,TROPONINI:3 in the last 72 hours No results found for this basename: TSH,T4TOTAL,FREET3,T3FREE,THYROIDAB in the last 72 hours No results found for this basename:  VITAMINB12:2,FOLATE:2,FERRITIN:2,TIBC:2,IRON:2,RETICCTPCT:2 in the last 72 hours  Studies/Results: Dg Chest Port 1 View  08/17/2012  *RADIOLOGY REPORT*  Clinical Data: Drug overdose.  Unresponsive.  COPD.  PORTABLE CHEST - 1 VIEW  Comparison: 07/09/2010  Findings: Airspace opacities present in both lung bases, left greater than right, with mild obscuration of left hemidiaphragm.  Cardiomegaly noted.  Atherosclerotic calcification in the aortic arch present.  Indistinct pulmonary vasculature mild interstitial accentuation noted in both lungs.  Thoracic spondylosis is present.  IMPRESSION:  1.  Interstitial accentuation in the lungs, slightly more confluent in the lung bases.  There is mild left basilar airspace opacity. The appearance could be due to drug reaction, edema, aspiration pneumonitis, or pneumonia. 2.  Mild cardiomegaly. 3.  Thoracic spondylosis.   Original Report Authenticated By: Dellia Cloud, M.D.      Medications: Scheduled:   . albuterol  2.5 mg Nebulization QID  . ampicillin-sulbactam (UNASYN) IV  3 g Intravenous Once  . aspirin EC  81 mg Oral Daily  . carbidopa-levodopa  1 tablet Oral Daily  . docusate sodium  100 mg Oral BID  . enoxaparin (LOVENOX) injection  40 mg Subcutaneous Q24H  . gabapentin  300 mg Oral TID  . guaiFENesin  600 mg Oral BID  . ipratropium  0.5 mg Nebulization QID  . levothyroxine  112 mcg Oral Q breakfast  . methylPREDNISolone (SOLU-MEDROL) injection  60 mg Intravenous Q8H  . mirtazapine  30 mg Oral QHS  . piperacillin-tazobactam  3.375 g Intravenous Q6H  . QUEtiapine  25 mg Oral QHS  . sertraline  100 mg Oral Daily  . simvastatin  40 mg Oral QHS  . sodium chloride  3 mL Intravenous Q12H  . DISCONTD: albuterol  2.5 mg Nebulization Q6H  . DISCONTD: ipratropium  0.5 mg Nebulization Q6H  . DISCONTD: Melatonin  3 mg Oral Daily   Continuous:   . sodium chloride 20 mL/hr at 08/17/12 2240    Assessment/Plan: Principal Problem: 1.  *Aspiration pneumonia- continue Zosyn, nebs.  Repeat CXR.  Speech therapy evaluation today when awake- may advance diet if OK.  Active Problems: 2. Toxic encephalopathy- more drowsy this am, likely due to MSO4 given for pain.  Will monitor on appropriate doses of serqoquel and Neurontin (he was taking excessive amounts at home). 3. COPD with acute exacerbation- continue steroids (change to po tomorrow if stable), nebs, antibiotics. 4. Sacral decubitus ulcer- wound care consult pending.  Chronic issue. 5. Chronic pain- continue MSO4, neurontin for now.  Will try to transition to oral agents. 6. HYPOTHYROIDISM- continue synthroid. 7. Anxiety disorder- continue sertraline and low dose seroquel. 8. Chronic kidney disease, stage III (moderate)- BMET pending. 9. Disposition- PT/OT, SW consults pending.    LOS: 1 day   SHAW,W DOUGLAS 08/18/2012, 6:59 AM

## 2012-08-18 NOTE — Evaluation (Signed)
Clinical/Bedside Swallow Evaluation Patient Details  Name: [name] MRN: [id number] Date of Birth: 1929/10/14  Today's Date: 08/18/2012 Time: 4098-1191 SLP Time Calculation (min): 30 min  Past Medical History:  Past Medical History  Diagnosis Date  . Hyperlipidemia   . COPD (chronic obstructive pulmonary disease)   . Leg pain   . GERD (gastroesophageal reflux disease)   . Depression   . Dizziness   . Productive cough   . Thyroid disease   . Peripheral vascular disease   . Chronic kidney disease   . ED (erectile dysfunction)   . Hypertension     08/17/12 - wife denies   Past Surgical History:  Past Surgical History  Procedure Date  . Renal artery stent     left   HPI:  78 yr old admitted with confusion and cough,metabolic encephalopathy and pna.  PMH:  esophageal stricture/diverticulum and dilation, COPD, sacral ulcer, COPD exacerbation 3 weeks ago.  CXR with improved pulmonary aeration, persistent effusions, airspace disease and atelectasis.   Assessment / Plan / Recommendation Clinical Impression  Pt. exhibited immediate throat clears following sips of thin water and nectar juice.  Pt. has history of esophageal stricture/diverticulum and wife reports pt. esophagus "was stretched maybe 3-4 yrs ago".  Additional risk factor for pharyngeal dysphagia is his COPD.  He does not have history of neurological impairments.  SLP suspects that throat clears may be the result of esophageal dysphagia which may be irritating esophagus.  If pt. is aspirating, suspicious this is from an esophageal source rather than before or during the swallow.  Recommend a Dys 3 diet (no bottom dentition) and small sips thin liquds.  Reviewed reflux precautions with wife (pt. asleep) and encouraged GI consult once discharged to assess current esophageal anatomy.  In addition, explained to wife if pt. has pna in the near future, would recommend an MBS at that time (due to multiple pna's and history of  COPD).      Aspiration Risk  Mild    Diet Recommendation Dysphagia 3 (Mechanical Soft);Thin liquid   Liquid Administration via: Cup Medication Administration: Whole meds with liquid Supervision: Intermittent supervision to cue for compensatory strategies;Patient able to self feed Compensations: Slow rate;Small sips/bites;Follow solids with liquid Postural Changes and/or Swallow Maneuvers: Seated upright 90 degrees;Upright 30-60 min after meal    Other  Recommendations Recommended Consults: Consider GI evaluation Oral Care Recommendations: Oral care BID   Follow Up Recommendations  None    Frequency and Duration min 2x/week  2 weeks        SLP Swallow Goals Patient will consume recommended diet without observed clinical signs of aspiration with: Minimal cueing Patient will utilize recommended strategies during swallow to increase swallowing safety with: Minimal cueing   Swallow Study Prior Functional Status  Lives With: Spouse;Son Available Help at Discharge: Family;Available 24 hours/day       Oral/Motor/Sensory Function Overall Oral Motor/Sensory Function: Appears within functional limits for tasks assessed   Ice Chips Ice chips: Not tested   Thin Liquid Thin Liquid: Impaired Presentation: Cup Pharyngeal  Phase Impairments: Cough - Immediate    Nectar Thick Nectar Thick Liquid: Impaired Presentation: Cup Pharyngeal Phase Impairments: Cough - Immediate   Honey Thick Honey Thick Liquid: Not tested   Puree Puree: Within functional limits Presentation: Self Fed;Spoon   Solid       Solid: Within functional limits       Royce Macadamia M.Ed [employer] [phone]  08/18/2012

## 2012-08-18 NOTE — Evaluation (Signed)
Physical Therapy Evaluation Patient Details Name: [name] MRN: [id number] DOB: [birth year] Today's Date: 08/18/2012 Time: 4098-1191 PT Time Calculation (min): 19 min  PT Assessment / Plan / Recommendation Clinical Impression  78 y.o. male admitted to [facility] for PNA, metabolic encepholopathy.  He presents today unsteady on his feet with a productive cough while walking.  He is generally weak and unsteady.      PT Assessment  Patient needs continued PT services    Follow Up Recommendations  Home health PT;Supervision/Assistance - 24 hour    Barriers to Discharge        Equipment Recommendations  Rolling walker with 5" wheels    Recommendations for Other Services     Frequency Min 3X/week    Precautions / Restrictions Precautions Precautions: Fall Precaution Comments: monitor O2 sats, pt reports he uses O2 at home Restrictions Weight Bearing Restrictions: No   Pertinent Vitals/Pain No reports of pain, O2 sats 92% on 3 L O2NC     Mobility  Bed Mobility Supine to Sit: 6: Modified independent (Device/Increase time);With rails;HOB elevated Sitting - Scoot to Edge of Bed: 6: Modified independent (Device/Increase time);With rail Details for Bed Mobility Assistance: heavy reliance on rail to get to sitting Transfers Sit to Stand: 4: Min assist;From elevated surface;With upper extremity assist;From bed Stand to Sit: 4: Min guard;With upper extremity assist;To elevated surface;To bed Details for Transfer Assistance: multiple attempts to get to standing.  min assist needed to boost his trunk up to standing.   Ambulation/Gait Ambulation/Gait Assistance: 4: Min assist Ambulation Distance (Feet): 120 Feet Assistive device: Rolling walker Ambulation/Gait Assistance Details: min assist to help steady pt for balance and steer RW.  Verbal cues for upright posture and to stay closer to RW.  Pt reports he doesn't use an assistive device at home Gait Pattern: Step-through  pattern;Shuffle;Trunk flexed    Exercises     PT Diagnosis: Difficulty walking;Abnormality of gait;Generalized weakness;Altered mental status  PT Problem List: Decreased strength;Decreased activity tolerance;Decreased balance;Decreased mobility;Decreased coordination;Decreased knowledge of use of DME;Decreased safety awareness;Cardiopulmonary status limiting activity PT Treatment Interventions:     PT Goals Acute Rehab PT Goals PT Goal Formulation: With patient Time For Goal Achievement: 09/01/12 Potential to Achieve Goals: Good Pt will go Supine/Side to Sit: Independently;with HOB 0 degrees PT Goal: Supine/Side to Sit - Progress: Goal set today Pt will go Sit to Supine/Side: Independently;with HOB 0 degrees PT Goal: Sit to Supine/Side - Progress: Goal set today Pt will go Sit to Stand: with supervision PT Goal: Sit to Stand - Progress: Goal set today Pt will go Stand to Sit: with supervision PT Goal: Stand to Sit - Progress: Goal set today Pt will Ambulate: >150 feet;with supervision;with least restrictive assistive device PT Goal: Ambulate - Progress: Goal set today Pt will Go Up / Down Stairs: 3-5 stairs;with supervision;with least restrictive assistive device PT Goal: Up/Down Stairs - Progress: Goal set today  Visit Information  Last PT Received On: 08/18/12 Assistance Needed: +1    Subjective Data  Subjective: [place] when asked where he is today   Prior Functioning  Home Living Lives With: Spouse;Son Available Help at Discharge: Family;Available 24 hours/day Additional Comments: pt reports he has home O2 "full blast" when asked how many liters he uses at home Communication Communication: No difficulties    Cognition  Overall Cognitive Status: Impaired Cognition - Other Comments: seems confused, vs trying to be funny.  Questionable accuracy of  history    Extremity/Trunk Assessment Right Lower Extremity Assessment RLE  ROM/Strength/Tone: Deficits RLE ROM/Strength/Tone Deficits: grossly at least 3/5 per functional assessment Left Lower Extremity Assessment LLE ROM/Strength/Tone: Deficits LLE ROM/Strength/Tone Deficits: grossly at least 3/5 per functional assessment.  Bil leg edema, especially in feet   Balance    End of Session PT - End of Session Equipment Utilized During Treatment: Gait belt;Oxygen Activity Tolerance: Patient limited by fatigue Patient left: in bed;with call bell/phone within reach;with nursing in room (seated EOB with RN tech who is going to bathe him)  GP     Lurena Joiner B. Shivonne Schwartzman, PT, DPT [phone]   08/18/2012, 10:12 AM

## 2012-08-19 LAB — URINE CULTURE

## 2012-08-19 LAB — CBC
HCT: 39.9 % (ref 39.0–52.0)
Hemoglobin: 13.1 g/dL (ref 13.0–17.0)
MCH: 28.9 pg (ref 26.0–34.0)
MCHC: 32.8 g/dL (ref 30.0–36.0)
MCV: 88.1 fL (ref 78.0–100.0)
RDW: 14.4 % (ref 11.5–15.5)

## 2012-08-19 LAB — BASIC METABOLIC PANEL
BUN: 46 mg/dL — ABNORMAL HIGH (ref 6–23)
Creatinine, Ser: 1.62 mg/dL — ABNORMAL HIGH (ref 0.50–1.35)
GFR calc Af Amer: 44 mL/min — ABNORMAL LOW (ref >90)
GFR calc non Af Amer: 38 mL/min — ABNORMAL LOW (ref >90)
Glucose, Bld: 192 mg/dL — ABNORMAL HIGH (ref 70–99)

## 2012-08-19 MED ORDER — PREDNISONE 50 MG PO TABS
60.0000 mg | ORAL_TABLET | Freq: Every day | ORAL | Status: DC
  Administered 2012-08-19 – 2012-08-20 (×2): 60 mg via ORAL
  Filled 2012-08-19 (×3): qty 1

## 2012-08-19 NOTE — Progress Notes (Signed)
Subjective: Drowsy but awakens to report that he is feeling better.  Denies buttocks pain.  RN reports that he was awake throughout the day yesterday complained of anxiety and some buttocks pain but improved with Klonopin and reassurance.    Objective: Vital signs in last 24 hours: Temp:  [96.8 F (36 C)-98.1 F (36.7 C)] 96.8 F (36 C) (08/31 2200) Pulse Rate:  [75-90] 75  (08/31 2200) Resp:  [18] 18  (08/31 2200) BP: (124-146)/(72-83) 134/83 mmHg (08/31 2200) SpO2:  [89 %-100 %] 100 % (08/31 2200) Weight change:  Last BM Date: 08/15/12  CBG (last 3)  No results found for this basename: GLUCAP:3 in the last 72 hours  Intake/Output from previous day: 08/31 0701 - 09/01 0700 In: -  Out: 600 [Urine:600] Intake/Output this shift:    General appearance: drowsy, breathing comfortably Eyes: no scleral icterus Throat: oropharynx moist without erythema Resp: minimal bilateral expiratory wheezing Cardio: regular rate and rhythm GI: soft, non-tender; bowel sounds normal; no masses,  no organomegaly Extremities: no clubbing, cyanosis; decreased edema 1+ bilateral  Lab Results:  [facility] 08/19/12 0525 08/18/12 0601  NA 142 141  K 3.9 4.4  CL 102 102  CO2 32 28  GLUCOSE 192* 158*  BUN 46* 36*  CREATININE 1.62* 1.46*  CALCIUM 8.8 9.5  MG -- --  PHOS -- --    Basename 08/17/12 1526  AST 16  ALT 22  ALKPHOS 84  BILITOT 0.6  PROT 6.7  ALBUMIN 2.7*    Basename 08/19/12 0525 08/18/12 0601 08/17/12 1526  WBC 19.3* 15.9* --  NEUTROABS -- -- 18.6*  HGB 13.1 13.5 --  HCT 39.9 40.7 --  MCV 88.1 88.1 --  PLT 201 168 --    Studies/Results: Dg Chest 2 View  08/18/2012  *RADIOLOGY REPORT*  Clinical Data: The patient pneumonia.  Short of breath.  CHEST - 2 VIEW  Comparison: 08/17/2012.  Findings: Small bilateral pleural effusions are evident on the lateral view.  Bilateral basilar airspace disease and atelectasis is present.  There may be slight improvement in pulmonary  aeration compared to yesterday's examination.  Tortuous thoracic aorta with ectatic aortic arch. Aortic arch configuration appears similar to prior exams dating back 07/09/2010.  Cardiomegaly is present. Monitoring leads are projected over the chest.  IMPRESSION: Minimal improvement pulmonary aeration with persistent bilateral effusions, basilar airspace disease and atelectasis.   Original Report Authenticated By: Andreas Newport, M.D.    Dg Chest Port 1 View  08/17/2012  *RADIOLOGY REPORT*  Clinical Data: Drug overdose.  Unresponsive.  COPD.  PORTABLE CHEST - 1 VIEW  Comparison: 07/09/2010  Findings: Airspace opacities present in both lung bases, left greater than right, with mild obscuration of left hemidiaphragm.  Cardiomegaly noted.  Atherosclerotic calcification in the aortic arch present.  Indistinct pulmonary vasculature mild interstitial accentuation noted in both lungs.  Thoracic spondylosis is present.  IMPRESSION:  1.  Interstitial accentuation in the lungs, slightly more confluent in the lung bases.  There is mild left basilar airspace opacity. The appearance could be due to drug reaction, edema, aspiration pneumonitis, or pneumonia. 2.  Mild cardiomegaly. 3.  Thoracic spondylosis.   Original Report Authenticated By: Dellia Cloud, M.D.      Medications: Scheduled:   . albuterol  2.5 mg Nebulization QID  . aspirin EC  81 mg Oral Daily  . carbidopa-levodopa  1 tablet Oral QHS  . docusate sodium  100 mg Oral BID  . enoxaparin (LOVENOX) injection  40 mg Subcutaneous  Q24H  . gabapentin  300 mg Oral TID  . guaiFENesin  600 mg Oral BID  . ipratropium  0.5 mg Nebulization QID  . levothyroxine  112 mcg Oral Q breakfast  . methylPREDNISolone (SOLU-MEDROL) injection  60 mg Intravenous Q8H  . mirtazapine  30 mg Oral QHS  . piperacillin-tazobactam (ZOSYN)  IV  3.375 g Intravenous Q8H  . QUEtiapine  25 mg Oral QHS  . sertraline  100 mg Oral Daily  . simvastatin  40 mg Oral QHS  . sodium  chloride  3 mL Intravenous Q12H  . DISCONTD: carbidopa-levodopa  1 tablet Oral Daily   Continuous:   . sodium chloride 20 mL/hr at 08/17/12 2240    Assessment/Plan: Principal Problem:  1. *Aspiration pneumonia- continue Zosyn, nebs. Repeat CXR shows some improvement.  Wean O2 as tolerated. Diet per ST with aspiration precautions  Active Problems:  2. Toxic encephalopathy- drowsy this am, likely due to MSO4 given for pain last evening but better throughout the day per RN. Will monitor on appropriate doses of serqoquel and Neurontin (he was taking excessive amounts at home).  3. COPD with acute exacerbation- improved.  Transition to oral steroids.  Continue nebs, antibiotics, wean O2. 4. Sacral decubitus ulcer- continue Duoderm. Wound care consult pending. Chronic issue.  5. Chronic pain- continue MSO4, neurontin for now. Will need try to transition to oral agents prior to discharge.  6. HYPOTHYROIDISM- continue synthroid.  7. Anxiety disorder- continue sertraline and low dose seroquel.  8. Acute on Chronic kidney disease, stage III (moderate)-  Slight increase in creatinine.  Monitor. 9. Disposition- continue PT/OT- anticipate discharge to home once stable.   LOS: 2 days   Jakaya Jacobowitz,W DOUGLAS 08/19/2012, 7:30 AM

## 2012-08-19 NOTE — Progress Notes (Signed)
INITIAL ADULT NUTRITION ASSESSMENT Date: 08/19/2012   Time: 3:15 PM  Reason for Assessment: MST score of 2  ASSESSMENT: Male 78 y.o.  Dx: Aspiration pneumonia  Hx:  Past Medical History  Diagnosis Date  . Hyperlipidemia   . COPD (chronic obstructive pulmonary disease)   . Leg pain   . GERD (gastroesophageal reflux disease)   . Depression   . Dizziness   . Productive cough   . Thyroid disease   . Peripheral vascular disease   . Chronic kidney disease   . ED (erectile dysfunction)   . Hypertension     08/17/12 - wife denies    Related Meds:     . albuterol  2.5 mg Nebulization QID  . aspirin EC  81 mg Oral Daily  . carbidopa-levodopa  1 tablet Oral QHS  . docusate sodium  100 mg Oral BID  . enoxaparin (LOVENOX) injection  40 mg Subcutaneous Q24H  . gabapentin  300 mg Oral TID  . guaiFENesin  600 mg Oral BID  . ipratropium  0.5 mg Nebulization QID  . levothyroxine  112 mcg Oral Q breakfast  . mirtazapine  30 mg Oral QHS  . piperacillin-tazobactam (ZOSYN)  IV  3.375 g Intravenous Q8H  . predniSONE  60 mg Oral QAC breakfast  . QUEtiapine  25 mg Oral QHS  . sertraline  100 mg Oral Daily  . simvastatin  40 mg Oral QHS  . sodium chloride  3 mL Intravenous Q12H  . DISCONTD: methylPREDNISolone (SOLU-MEDROL) injection  60 mg Intravenous Q8H    Ht: 6' (182.9 cm)  Wt: 212 lb (96.163 kg)  Ideal Wt: 80.9 kg % Ideal Wt: 119%  Usual Wt: 100.9 kg % Usual Wt: 95%  Body mass index is 28.75 kg/(m^2). Patient is overweight  Food/Nutrition Related Hx: Poor appetite for 1 week PTA  Labs:  CMP     Component Value Date/Time   NA 142 08/19/2012 0525   K 3.9 08/19/2012 0525   CL 102 08/19/2012 0525   CO2 32 08/19/2012 0525   GLUCOSE 192* 08/19/2012 0525   BUN 46* 08/19/2012 0525   CREATININE 1.62* 08/19/2012 0525   CALCIUM 8.8 08/19/2012 0525   PROT 6.7 08/17/2012 1526   ALBUMIN 2.7* 08/17/2012 1526   AST 16 08/17/2012 1526   ALT 22 08/17/2012 1526   ALKPHOS 84 08/17/2012 1526   BILITOT  0.6 08/17/2012 1526   GFRNONAA 38* 08/19/2012 0525   GFRAA 44* 08/19/2012 0525    Intake/Output Summary (Last 24 hours) at 08/19/12 1517 Last data filed at 08/19/12 0600  Gross per 24 hour  Intake      0 ml  Output    400 ml  Net   -400 ml    Diet Order: Dysphagia 3, thin  Supplements/Tube Feeding: None  IVF:    sodium chloride Last Rate: 50 mL/hr at 08/19/12 0855    Estimated Nutritional Needs:   Kcal: 2000-2200 kcal Protein: 75-95 g Fluid: 2.4 L  Patient admitted with aspiration pneumonia and toxic encephalopathy. He reports a poor appetite for 1 week PTA due to acute illness. Appetite has improved with meal intake >75%. He did lose 10 pounds in 1 week (5% UBW), which meets the criteria for severe malnutrition in the context of acute illness.   NUTRITION DIAGNOSIS: -Malnutrition (NI-5.2).  Status: Ongoing  RELATED TO: acute illness  AS EVIDENCE BY: 5% weight loss and PO intake <75% of estimated needs  MONITORING/EVALUATION(Goals): Patient will meet 90-100% of estimated nutrition  needs  Monitor: PO intake, weight, labs  EDUCATION NEEDS: -No education needs identified at this time  INTERVENTION: No nutrition interventions at this time. RD will monitor PO intake and add interventions as needed.    DOCUMENTATION CODES Per approved criteria  -Severe malnutrition in the context of acute illness or injury    Fabio Pierce 08/19/2012, 3:15 PM

## 2012-08-20 LAB — CBC
MCH: 28.5 pg (ref 26.0–34.0)
MCHC: 32.7 g/dL (ref 30.0–36.0)
RDW: 14.4 % (ref 11.5–15.5)

## 2012-08-20 LAB — BASIC METABOLIC PANEL
BUN: 39 mg/dL — ABNORMAL HIGH (ref 6–23)
Calcium: 8.5 mg/dL (ref 8.4–10.5)
GFR calc Af Amer: 53 mL/min — ABNORMAL LOW (ref >90)
GFR calc non Af Amer: 46 mL/min — ABNORMAL LOW (ref >90)
Glucose, Bld: 107 mg/dL — ABNORMAL HIGH (ref 70–99)
Potassium: 3.6 mEq/L (ref 3.5–5.1)
Sodium: 142 mEq/L (ref 135–145)

## 2012-08-20 MED ORDER — SODIUM CHLORIDE 0.9 % IV SOLN: INTRAVENOUS | Status: DC

## 2012-08-20 MED ORDER — PREDNISONE 20 MG PO TABS
40.0000 mg | ORAL_TABLET | Freq: Every day | ORAL | Status: DC
  Administered 2012-08-21 – 2012-08-23 (×3): 40 mg via ORAL
  Filled 2012-08-20 (×4): qty 2

## 2012-08-20 MED ORDER — ENSURE PUDDING PO PUDG
1.0000 | Freq: Three times a day (TID) | ORAL | Status: DC
  Administered 2012-08-20 – 2012-08-23 (×9): 1 via ORAL
  Filled 2012-08-20 (×12): qty 1

## 2012-08-20 NOTE — Progress Notes (Signed)
Subjective: Alert today with good cognition, complaining about the food, but ate about 50% of breakfast. Having a somewhat productive cough, no dyspnea at rest.  Objective: Vital signs in last 24 hours: Temp:  [97.6 F (36.4 C)-97.8 F (36.6 C)] 97.8 F (36.6 C) (09/02 0600) Pulse Rate:  [65-76] 65  (09/02 0600) Resp:  [18-20] 20  (09/02 0600) BP: (118-138)/(58-71) 118/66 mmHg (09/02 0600) SpO2:  [90 %-94 %] 90 % (09/02 0600) Weight change:    Intake/Output from previous day:     General appearance: alert, cooperative and no distress Resp: minimal bilateral scattered wheezing, no use of secondary muscles of respiration Cardio: regular rate and rhythm and with a 1/6 SEM GI: soft, non-tender; bowel sounds normal; no masses,  no organomegaly Extremities: bilateral trace ankle edema  Lab Results:  Basename 08/20/12 0444 08/19/12 0525  WBC 17.3* 19.3*  HGB 12.9* 13.1  HCT 39.4 39.9  PLT 213 201   BMET  Basename 08/20/12 0444 08/19/12 0525  NA 142 142  K 3.6 3.9  CL 104 102  CO2 32 32  GLUCOSE 107* 192*  BUN 39* 46*  CREATININE 1.39* 1.62*  CALCIUM 8.5 8.8   CMET CMP     Component Value Date/Time   NA 142 08/20/2012 0444   K 3.6 08/20/2012 0444   CL 104 08/20/2012 0444   CO2 32 08/20/2012 0444   GLUCOSE 107* 08/20/2012 0444   BUN 39* 08/20/2012 0444   CREATININE 1.39* 08/20/2012 0444   CALCIUM 8.5 08/20/2012 0444   PROT 6.7 08/17/2012 1526   ALBUMIN 2.7* 08/17/2012 1526   AST 16 08/17/2012 1526   ALT 22 08/17/2012 1526   ALKPHOS 84 08/17/2012 1526   BILITOT 0.6 08/17/2012 1526   GFRNONAA 46* 08/20/2012 0444   GFRAA 53* 08/20/2012 0444    CBG (last 3)  No results found for this basename: GLUCAP:3 in the last 72 hours  INR RESULTS:   No results found for this basename: INR, PROTIME     Studies/Results: No results found.  Medications: I have reviewed the patient's current medications.  Assessment/Plan: #1 Pneumonia: slowly improving on antibiotics, nebs, and steroids.  Will decrease prednisone to 40 mg daily, may be ready for discharge in 24-48 hours. #2 Encephalopathy: resolved #3 Acute renal insufficiency: improved with IVF #4 Protein calorie malnutrition: will add ensure pudding  LOS: 3 days   Woodruff Skirvin G 08/20/2012, 8:58 AM

## 2012-08-20 NOTE — Progress Notes (Signed)
PT Cancellation Note  ___Treatment cancelled today due to medical issues with patient which prohibited therapy  ___ Treatment cancelled today due to patient receiving procedure or test   ___ Treatment cancelled today due to patient's refusal to participate   _X_ Treatment cancelled today due 2 unsuccessful attempts.  AM pt requested to rest.  PM pt too fatigued after OT session.  Felecia Shelling  PTA [facility]  Acute  Rehab Pager     [phone]

## 2012-08-20 NOTE — Consult Note (Signed)
WOC consult Note Reason for Consult: eval sacral pressure ulcer. Pt reports wound in the gluteal fold x 4 years.  Does not appear to be related to pressure today on my exam.  He reports dx of eczema but he does not have presentation of this in any other areas.  This area appears to have some partial thickness skin loss and is somewhat moist. Pt does report that he spends a lot of time up in the recliner on this area as well.  Wound type: MASD (moisture associated skin damage) but with chronic problems and pain would recommend follow up with dermatology to r/o any other types of skin related issues.  Measurement: scattered areas of partial thickness skin loss and sheer injury.  Wound bed: pink and moist, does have one area that oozing blood Drainage (amount, consistency, odor) no odor and no notable drainage Periwound:intact, but some maceration of the tissue. Dressing procedure/placement/frequency: hydrocolloid for protection and insulation of this area. Turn off this area from side to side at least every two hours. Pressure redistribution pad for the chair when pt up.  Re consult if needed, will not follow at this time. Thanks  Burns Timson [employer], CWOCN [phone])

## 2012-08-20 NOTE — Progress Notes (Addendum)
Occupational Therapy Treatment Patient Details Name: [name] MRN: [id number] DOB: 05/18/1929 Today's Date: 08/20/2012 Time: 4098-1191 OT Time Calculation (min): 25 min  OT Assessment / Plan / Recommendation Comments on Treatment Session Pt transferred into bathroom and back to bed. Needs consistent verbal cues for safety with hand placement and RW safety. He does have a handicapped height toilet at home and vanity beside.     Follow Up Recommendations  Home health OT;Supervision/Assistance - 24 hour    Barriers to Discharge       Equipment Recommendations  Rolling walker with 5" wheels    Recommendations for Other Services    Frequency Min 2X/week   Plan Discharge plan remains appropriate    Precautions / Restrictions Precautions Precautions: Fall Precaution Comments: monitor O2 sats, pt doesnt wear O2 at home.    Pertinent Vitals/Pain Pt's O2 sats at EOB fluctuated from 88 to 93% on RA. Pt O2 sats on RA after transfer into the bathroom and back to bed were 87%. Encouraged PLB throughout. Reapplied O2. O2 on 2L back up to 92%. Informed nursing.     ADL  Grooming: Performed;Wash/dry hands;Min guard Where Assessed - Grooming: Unsupported standing Toilet Transfer: Performed;Minimal assistance Toilet Transfer Method: Other (comment) (ambulating) Acupuncturist: Comfort height toilet;Grab bars Toileting - Clothing Manipulation and Hygiene: Simulated;Minimal assistance ADL Comments: Pt impulsive at times, needs cues to slow down, stay inside of RW and continue to use RW until all the way backed up to surface to sit. Pt needs frequent reminders for hand placement to push up from grab bar or bed and not pull up on RW. Pt also needs cues to remember to do PLB when feeling SOB.     OT Diagnosis:    OT Problem List:   OT Treatment Interventions:     OT Goals ADL Goals ADL Goal: Grooming - Progress: Progressing toward goals ADL Goal: Toilet Transfer - Progress:  Progressing toward goals ADL Goal: Toileting - Clothing Manipulation - Progress: Progressing toward goals  Visit Information  Last OT Received On: 08/20/12 Assistance Needed: +1    Subjective Data  Subjective: I need to go to the bathroom Patient Stated Goal: agreeable up with OT   Prior Functioning       Cognition  Overall Cognitive Status: Impaired Area of Impairment: Awareness of errors;Safety/judgement Arousal/Alertness: Awake/alert Safety/Judgement: Decreased awareness of safety precautions;Decreased safety judgement for tasks assessed Safety/Judgement - Other Comments: impulsive at times    Mobility  Shoulder Instructions Bed Mobility Bed Mobility: Supine to Sit;Sit to Supine Supine to Sit: 6: Modified independent (Device/Increase time);HOB elevated;With rails Sitting - Scoot to Edge of Bed: 6: Modified independent (Device/Increase time);With rail Sit to Supine: 6: Modified independent (Device/Increase time);HOB elevated;With rail Transfers Transfers: Sit to Stand;Stand to Sit Sit to Stand: 4: Min guard;With upper extremity assist;From bed Stand to Sit: 4: Min assist;With upper extremity assist;To bed;To toilet Details for Transfer Assistance: consistent verbal cues throughout session for hand placement, scooting to EOB before trying to stand, overall safety.        Exercises      Balance     End of Session OT - End of Session Activity Tolerance: Other (comment) (O2 sats decrease on RA) Patient left: in bed;with call bell/phone within reach;with family/visitor present  GO     [name] [phone] 08/20/2012, 12:53 PM

## 2012-08-20 NOTE — Progress Notes (Signed)
Chart review.  Noted that CSW received a consult stating "assistance at home."  CSW notified RNCM, as RNCM can assist Pt with any home needs.  Providence Crosby, LCSWA Clinical Social Work [phone]

## 2012-08-20 NOTE — Progress Notes (Signed)
Speech Language Pathology Dysphagia Treatment Patient Details Name: [name] MRN: [id number] DOB: 28-Oct-1929 Today's Date: 08/20/2012 Time: 7829-5621 SLP Time Calculation (min): 31 min  Assessment / Plan / Recommendation Clinical Impression  Follow up to assess tolerance of diet and for education regarding pt's dysphagia.  Observed pt swallowing water with no clinical s/s of aspiration.  Spouse reports pt ate chocolate pudding last night at rapid rate and ended up vomiting it.  Advised pt to strict precautions and compensations due to known h/o esophageal dysmotlity, diverticulae.  Pt and spouse report he did not go to see an esophagologist after GI MD visit in 2009 due to expense, nor has he been back to see his GI MD.   Pt states his swallow function is the same as it was in 2009.     SLP to follow with pt one more time to assure all education understood.  Agree that primary aspiration risk is from known esophageal source and AMS upon admit from toxic encephalopathy.  Noted pt takes Sinemet which his spouse states is for his legs (RLS) and pt states helps with his hand tremor.  Also pt with xerostomia- uses Biotene at home "at times".    Please also note, pt reports not using Aciphex for many years due to expense, and he currently self txs his GERD with Tums.  Rec follow up with GI as an outpt.     Diet Recommendation  Continue with Current Diet: Dysphagia 3 (mechanical soft);Thin liquid    SLP Plan Continue with current plan of care   Pertinent Vitals/Pain Afebrile, chronic decr productive cough   Swallowing Goals  SLP Swallowing Goals Goal #3: progressing  General Temperature Spikes Noted: No Respiratory Status: Supplemental O2 delivered via (comment) Patient Positioning: Upright in bed    Dysphagia Treatment Treatment focused on: Skilled observation of diet tolerance;Patient/family/caregiver Dealer Educated: spouse Treatment Methods/Modalities:  Skilled observation Feeding: Able to feed self Liquids provided via: Straw Type of cueing: Verbal Amount of cueing: Minimal (to take small boluses)   GO    Donavan Burnet, MS [facility] SLP [phone]

## 2012-08-20 NOTE — Progress Notes (Signed)
Utilization review completed.  

## 2012-08-21 ENCOUNTER — Inpatient Hospital Stay (HOSPITAL_COMMUNITY): Payer: Medicare Other

## 2012-08-21 LAB — CBC
Platelets: 229 10*3/uL (ref 150–400)
RDW: 14.4 % (ref 11.5–15.5)
WBC: 13.7 10*3/uL — ABNORMAL HIGH (ref 4.0–10.5)

## 2012-08-21 LAB — BASIC METABOLIC PANEL
Chloride: 102 mEq/L (ref 96–112)
GFR calc Af Amer: 59 mL/min — ABNORMAL LOW (ref >90)
Potassium: 3.9 mEq/L (ref 3.5–5.1)
Sodium: 139 mEq/L (ref 135–145)

## 2012-08-21 MED ORDER — AMOXICILLIN-POT CLAVULANATE 500-125 MG PO TABS
1.0000 | ORAL_TABLET | Freq: Two times a day (BID) | ORAL | Status: DC
  Administered 2012-08-21 – 2012-08-23 (×5): 500 mg via ORAL
  Filled 2012-08-21 (×7): qty 1

## 2012-08-21 NOTE — Progress Notes (Signed)
Subjective: Admitted with Seroquel OD and Asp PNA. ST saw pt yesterday and rec Diet Recommendation. Continue with Current Diet: Dysphagia 3 (mechanical soft);Thin liquid  He did OT yesterday and cancelled PT due to fatigue.  OT rec Home health OT;Supervision/Assistance - 24 hour. Rolling walker with 5" wheels  Eating better. Productive cough. Still wearing O2 and still with SOB. Seen by [facility] who recommended Hydrocolloid protection for gluteal fold. White count improving. Cxs (-) Denies pain.     Objective: Vital signs in last 24 hours: Temp:  [97.4 F (36.3 C)-98.2 F (36.8 C)] 97.9 F (36.6 C) (09/03 0500) Pulse Rate:  [77-82] 82  (09/03 0500) Resp:  [20] 20  (09/03 0500) BP: (105-151)/(67-78) 105/77 mmHg (09/03 0500) SpO2:  [87 %-95 %] 93 % (09/03 0500) Weight change:  Last BM Date: 08/17/12  CBG (last 3)  No results found for this basename: GLUCAP:3 in the last 72 hours  Intake/Output from previous day:  Intake/Output Summary (Last 24 hours) at 08/21/12 2956 Last data filed at 08/21/12 0520  Gross per 24 hour  Intake    749 ml  Output    500 ml  Net    249 ml   09/02 0701 - 09/03 0700 In: 749 [I.V.:299; IV Piggyback:450] Out: 500 [Urine:500]   Physical Exam General appearance: breathing comfortably, coughing Eyes: no scleral icterus  Throat: oropharynx moist without erythema  Resp: minimal bilateral expiratory wheezing  Cardio: regular rate and rhythm  GI: soft, non-tender; bowel sounds normal; no masses, no organomegaly  Extremities: no clubbing, cyanosis; Tr edema  bilateral   Lab Results:  Basename 08/21/12 0445 08/20/12 0444  NA 139 142  K 3.9 3.6  CL 102 104  CO2 30 32  GLUCOSE 105* 107*  BUN 31* 39*  CREATININE 1.27 1.39*  CALCIUM 8.5 8.5  MG -- --  PHOS -- --    No results found for this basename: AST:2,ALT:2,ALKPHOS:2,BILITOT:2,PROT:2,ALBUMIN:2 in the last 72 hours   Basename 08/21/12 0445 08/20/12 0444  WBC 13.7* 17.3*  NEUTROABS --  --  HGB 12.7* 12.9*  HCT 39.1 39.4  MCV 86.3 87.0  PLT 229 213    No results found for this basename: INR, PROTIME    No results found for this basename: CKTOTAL:3,CKMB:3,CKMBINDEX:3,TROPONINI:3 in the last 72 hours  No results found for this basename: TSH,T4TOTAL,FREET3,T3FREE,THYROIDAB in the last 72 hours  No results found for this basename: VITAMINB12:2,FOLATE:2,FERRITIN:2,TIBC:2,IRON:2,RETICCTPCT:2 in the last 72 hours  Micro Results: Recent Results (from the past 240 hour(s))  URINE CULTURE     Status: Normal   Collection Time   08/17/12  4:29 PM      Component Value Range Status Comment   Specimen Description URINE, CATHETERIZED   Final    Special Requests NONE   Final    Culture  Setup Time 08/18/2012 05:36   Final    Colony Count NO GROWTH   Final    Culture NO GROWTH   Final    Report Status 08/19/2012 FINAL   Final   CULTURE, BLOOD (ROUTINE X 2)     Status: Normal (Preliminary result)   Collection Time   08/17/12  8:14 PM      Component Value Range Status Comment   Specimen Description BLOOD LEFT HAND   Final    Special Requests BOTTLES DRAWN AEROBIC AND ANAEROBIC 2CC   Final    Culture  Setup Time 08/18/2012 05:00   Final    Culture     Final  Value:        BLOOD CULTURE RECEIVED NO GROWTH TO DATE CULTURE WILL BE HELD FOR 5 DAYS BEFORE ISSUING A FINAL NEGATIVE REPORT   Report Status PENDING   Incomplete   CULTURE, BLOOD (ROUTINE X 2)     Status: Normal (Preliminary result)   Collection Time   08/17/12  8:36 PM      Component Value Range Status Comment   Specimen Description BLOOD RIGHT HAND   Final    Special Requests BOTTLES DRAWN AEROBIC AND ANAEROBIC 4.5CC   Final    Culture  Setup Time 08/18/2012 05:00   Final    Culture     Final    Value:        BLOOD CULTURE RECEIVED NO GROWTH TO DATE CULTURE WILL BE HELD FOR 5 DAYS BEFORE ISSUING A FINAL NEGATIVE REPORT   Report Status PENDING   Incomplete      Studies/Results: No results found.   Medications:  Scheduled:   . albuterol  2.5 mg Nebulization QID  . aspirin EC  81 mg Oral Daily  . carbidopa-levodopa  1 tablet Oral QHS  . docusate sodium  100 mg Oral BID  . enoxaparin (LOVENOX) injection  40 mg Subcutaneous Q24H  . feeding supplement  1 Container Oral TID BM  . gabapentin  300 mg Oral TID  . guaiFENesin  600 mg Oral BID  . ipratropium  0.5 mg Nebulization QID  . levothyroxine  112 mcg Oral Q breakfast  . mirtazapine  30 mg Oral QHS  . piperacillin-tazobactam (ZOSYN)  IV  3.375 g Intravenous Q8H  . predniSONE  40 mg Oral QAC breakfast  . QUEtiapine  25 mg Oral QHS  . sertraline  100 mg Oral Daily  . simvastatin  40 mg Oral QHS  . sodium chloride  3 mL Intravenous Q12H  . DISCONTD: predniSONE  60 mg Oral QAC breakfast   Continuous:   . sodium chloride 20 mL/hr at 08/20/12 1303  . DISCONTD: sodium chloride 50 mL/hr at 08/19/12 4098     Assessment/Plan: Principal Problem:  *Aspiration pneumonia Active Problems:  HYPOTHYROIDISM  HYPERTENSION  Toxic encephalopathy  COPD with acute exacerbation  Sacral decubitus ulcer  Chronic pain  Anxiety disorder  Chronic kidney disease, stage III (moderate)   1. *Aspiration pneumonia- Change Zosyn to Augmentin, Continue nebs, O2 and Steroids. Repeat CXR today. Wean O2 as tolerated. White Count improving. Diet per ST with aspiration precautions  2. Toxic encephalopathy- Back on home Psych drugs and  on appropriate doses of serqoquel and Neurontin (he was taking excessive amounts at home).  3. COPD with acute exacerbation- improving on oral steroids. Continue nebs, antibiotics, wean O2.  4. Sacral decubitus ulcer- See Wound care consult note. 5. Chronic pain- continue Neurontin for now. D/C IV MSO4 as he will not get this at home.  May need try to transition to oral agents prior to discharge.  6. HYPOTHYROIDISM- continue synthroid.  7. Anxiety disorder- continue sertraline and low dose seroquel.  8. Acute on Chronic kidney disease,  stage III (moderate)- Cr 1.27 and fine. 9. Disposition- continue PT/OT- anticipate discharge to home over next day or so.  I will Hep lock PIV and change Abx to oral and see if we can encourage increased care of himsself.. 10. Protein calorie malnutrition: Ensure pudding.  See Nutrition note    ID -  Anti-infectives     Start     Dose/Rate Route Frequency Ordered Stop   08/18/12  1100  piperacillin-tazobactam (ZOSYN) IVPB 3.375 g       3.375 g 12.5 mL/hr over 240 Minutes Intravenous Every 8 hours 08/18/12 0711     08/18/12 0000   piperacillin-tazobactam (ZOSYN) IVPB 3.375 g  Status:  Discontinued        3.375 g 100 mL/hr over 30 Minutes Intravenous 4 times per day 08/17/12 2058 08/18/12 0711   08/17/12 1700   Ampicillin-Sulbactam (UNASYN) 3 g in sodium chloride 0.9 % 100 mL IVPB        3 g 100 mL/hr over 60 Minutes Intravenous  Once 08/17/12 1656 08/17/12 1912         DVT Prophylaxis    LOS: 4 days   Cyara Devoto M 08/21/2012, 6:52 AM

## 2012-08-21 NOTE — Progress Notes (Signed)
Occupational Therapy Treatment Patient Details Name: [name] MRN: [id number] DOB: 1929-07-02 Today's Date: 08/21/2012 Time: 3244-0102 OT Time Calculation (min): 23 min  OT Assessment / Plan / Recommendation Comments on Treatment Session Pt continues to display decreaesd safety with functional transfers and requires consistent verbal cues for hand placement and safety with RW. He tends to pull up using RW and doesnt stay inside RW during walking and lets go of RW too soon. Recommend 24/7 for safety and 3in1 (discussed 3in1 further with pt as he would benefit from 3in1 for increased safety at commode and use of 3in1 as shower chair for energy conservation as he fatigues with activity.     Follow Up Recommendations  Home health OT;Supervision/Assistance - 24 hour    Barriers to Discharge       Equipment Recommendations  3 in 1 bedside comode;Rolling walker with 5" wheels    Recommendations for Other Services    Frequency     Plan Discharge plan remains appropriate    Precautions / Restrictions Precautions Precautions: Fall Precaution Comments: monitor O2 sats.         ADL  Upper Body Bathing: Performed;Chest;Right arm;Left arm;Abdomen;Supervision/safety;Set up;Other (comment) (supervision for purse lip breathing) Where Assessed - Upper Body Bathing: Unsupported sitting Lower Body Bathing: Performed;Minimal assistance;Other (comment) (for feet and balance standing for periarea) Where Assessed - Lower Body Bathing: Supported sit to stand Toilet Transfer: Performed;Minimal assistance;Other (comment) (continues to displays decreased safety with RW) Toilet Transfer Equipment: Raised toilet seat with arms (or 3-in-1 over toilet) Equipment Used: Rolling walker ADL Comments: Pt with O2 off when OT arrived. Pt stating tubing hurting his ears and nose even with gauze pads on ears. Nsg aware of pt not wanting to wear O2. Encouraged pt to wear O2 for bath to help with SOB and pt put it  back on initially but then took it off again. Pt also not wanting to wear O2 to transfer into the bathroom. 88% on RA after transfer to toilet. Encouraged PLB throughout session and rest breaks as needed. Also discussed 3in1 option as energy conservation for showering and for increased safety over the toilet as he tends to pull up on the RW. Pt states "maybe." Again explained benefits of 3in1 as shower seat and need to not pull up on RW as it could lead to fall. Pt states he will "think about it." Recommend 3in1 if pt agreeable.     OT Diagnosis:    OT Problem List:   OT Treatment Interventions:     OT Goals ADL Goals ADL Goal: Upper Body Bathing - Progress: Met ADL Goal: Lower Body Bathing - Progress: Progressing toward goals ADL Goal: Toilet Transfer - Progress: Progressing toward goals  Visit Information  Last OT Received On: 08/21/12 Assistance Needed: +1    Subjective Data  Subjective: everyone has been in here at all at once Patient Stated Goal: agreeable to work on bath. didnt state a goal independently   Prior Functioning       Cognition  Overall Cognitive Status: Impaired Area of Impairment: Safety/judgement;Awareness of errors Arousal/Alertness: Awake/alert Behavior During Session: [facility] for tasks performed Safety/Judgement: Decreased awareness of safety precautions;Decreased safety judgement for tasks assessed Safety/Judgement - Other Comments: still displays impulsivity and decreased safety with pulling up on the RW despite repeated verbal cues to not do so. Explained he needs to push up from bed or with grab bar at toilet. Also explained need to continue to use RW until fully backed up  to surface to sit but pt continues to let go of RW and try to step around  to toilet or bed without it.     Mobility  Shoulder Instructions Bed Mobility Bed Mobility: Supine to Sit Supine to Sit: 6: Modified independent (Device/Increase time) Sitting - Scoot to Edge of Bed: 6: Modified  independent (Device/Increase time) Sit to Supine: 6: Modified independent (Device/Increase time) Transfers Transfers: Sit to Stand;Stand to Sit Sit to Stand: 4: Min guard;With upper extremity assist;From bed;From toilet Stand to Sit: With upper extremity assist;To toilet;To bed;4: Min assist Details for Transfer Assistance: repeated verbal cues for hand placement, not pulling up on RW, and overall safety. Min assist to safely descend to surface as he doesnt always reach back for bed.        Exercises      Balance     End of Session OT - End of Session Activity Tolerance: Patient limited by fatigue;Other (comment) (decreased O2 sats and fatigue. )  GO     Lennox Laity [phone] 08/21/2012, 11:48 AM

## 2012-08-21 NOTE — Progress Notes (Signed)
Physical Therapy Treatment Patient Details Name: [name] MRN: [id number] DOB: 23-Nov-1929 Today's Date: 08/21/2012 Time: 1151-1208 PT Time Calculation (min): 17 min  PT Assessment / Plan / Recommendation Comments on Treatment Session  pt progressing, wants to go home    Follow Up Recommendations  Home health PT;Supervision/Assistance - 24 hour    Barriers to Discharge        Equipment Recommendations  3 in 1 bedside comode;Rolling walker with 5" wheels    Recommendations for Other Services    Frequency Min 3X/week   Plan Discharge plan remains appropriate;Frequency remains appropriate    Precautions / Restrictions Precautions Precautions: Fall Precaution Comments: monitor O2 sats,, pt has (and does) report to PT that he uses O2 at home   Pertinent Vitals/Pain Resting O2 sats 93% on 2L  Exertional O2 sats 90-91% on 2L Pt removed O2 and sats dropped to 81%, pt replaced on PT advice and Sats returned to >91% HR 110-117 throughout    Mobility  Bed Mobility Bed Mobility: Supine to Sit Supine to Sit: 6: Modified independent (Device/Increase time) Sitting - Scoot to Edge of Bed: 6: Modified independent (Device/Increase time) Sit to Supine: 6: Modified independent (Device/Increase time) Transfers Sit to Stand: From bed;With upper extremity assist;5: Supervision Stand to Sit: 4: Min guard;To chair/3-in-1;Without upper extremity assist Details for Transfer Assistance: cues for hadn placemetn and to step back to chair for safety Ambulation/Gait Ambulation/Gait Assistance: 4: Min assist;Other (comment) (+2 available and utilized for equipment) Ambulation Distance (Feet): 100 Feet Assistive device: Rolling walker Ambulation/Gait Assistance Details: min assist to help steady pt for balance and steer RW. Verbal cues for upright posture and to stay closer to RW. Pt reports he doesn't use an assistive device at home Gait Pattern: Step-through pattern;Shuffle;Trunk flexed      Exercises     PT Diagnosis:    PT Problem List:   PT Treatment Interventions:     PT Goals Acute Rehab PT Goals Time For Goal Achievement: 09/01/12 Potential to Achieve Goals: Good Pt will go Supine/Side to Sit: Independently;with HOB 0 degrees PT Goal: Supine/Side to Sit - Progress: Progressing toward goal Pt will go Sit to Stand: with supervision PT Goal: Sit to Stand - Progress: Progressing toward goal Pt will go Stand to Sit: with supervision PT Goal: Stand to Sit - Progress: Progressing toward goal Pt will Ambulate: >150 feet;with supervision;with least restrictive assistive device PT Goal: Ambulate - Progress: Progressing toward goal  Visit Information  Last PT Received On: 08/21/12 Assistance Needed: +2 (for O2, IV, O2 sat monitor)    Subjective Data  Subjective: I walk with a cigar at home   Cognition  Overall Cognitive Status: Impaired Area of Impairment: Other (comment);Safety/judgement;Awareness of errors Arousal/Alertness: Awake/alert Orientation Level: Appears intact for tasks assessed Behavior During Session: [facility] for tasks performed Safety/Judgement: Decreased awareness of safety precautions;Decreased safety judgement for tasks assessed Safety/Judgement - Other Comments: pt mildly impulsive iwth PT; stood without assist initially;  tends to let go of RW to step to recliner, however pt does not use RW at home Cognition - Other Comments: uses humor at times to compensate for cognitive deficits    Balance     End of Session PT - End of Session Equipment Utilized During Treatment: Gait belt;Oxygen Activity Tolerance: Patient tolerated treatment well Patient left: in chair;with call bell/phone within reach   GP     [facility] 08/21/2012, 12:29 PM

## 2012-08-22 LAB — BASIC METABOLIC PANEL
BUN: 25 mg/dL — ABNORMAL HIGH (ref 6–23)
Chloride: 103 mEq/L (ref 96–112)
GFR calc Af Amer: 61 mL/min — ABNORMAL LOW (ref >90)
Glucose, Bld: 110 mg/dL — ABNORMAL HIGH (ref 70–99)
Potassium: 3.7 mEq/L (ref 3.5–5.1)

## 2012-08-22 LAB — CBC
HCT: 40.5 % (ref 39.0–52.0)
Hemoglobin: 13.4 g/dL (ref 13.0–17.0)
RBC: 4.7 MIL/uL (ref 4.22–5.81)
RDW: 14.3 % (ref 11.5–15.5)
WBC: 15.8 10*3/uL — ABNORMAL HIGH (ref 4.0–10.5)

## 2012-08-22 NOTE — Progress Notes (Signed)
Speech Language Pathology Dysphagia Treatment Patient Details Name: [name] MRN: [id number] DOB: 1929/01/05 Today's Date: 08/22/2012 Time: 2841-3244 SLP Time Calculation (min): 21 min  Assessment / Plan / Recommendation Clinical Impression  Purpose of visit was to review physiological reasoning of compensatory strategies and to assure tolerance of diet.  Unfortunately spouse not present currently.  Educated pt and he verbalized understanding to information provided.  Per documentation, pt consumed 100% of meal yesterday.  Pt remarked that he consumed oatmeal for breakfast but did not follow solids with liquids as he only had orange juice to drink.  Advised pt to consume liquids to faciliate clearance and that he may need to order more liquids.  Observed pt to consume Ensure chocolate drink without clinical s/s of aspiration.  Pt reports he does not like Ensure pudding and would prefer Boost chocolate liquid.      Note CXR 08/21/12 indicates continued pna improving pleural effusions.  RN reports pt with wet congested cough.  SLP to follow up one more time when wife is present to assure she has no questions.   Pt will be ongoing asp risk due to his multifactorial dysphagia, but risk may be mitigated with strategies.  Thanks.     Diet Recommendation  Continue with Current Diet: Dysphagia 3 (mechanical soft);Thin liquid    SLP Plan Continue with current plan of care   Pertinent Vitals/Pain Afebrile, rhonchi, 2 liters oxygen   Swallowing Goals  SLP Swallowing Goals Patient will consume recommended diet without observed clinical signs of aspiration with:  (progressing) Patient will utilize recommended strategies during swallow to increase swallowing safety with:  (progressing)  General Temperature Spikes Noted: No Respiratory Status: Supplemental O2 delivered via (comment) (pt required verbal cueing to replace oxygen) Behavior/Cognition: Alert;Cooperative  Oral Cavity - Oral  Hygiene Does patient have any of the following "at risk" factors?: Oxygen therapy - cannula, mask, simple oxygen devices Patient is HIGH RISK - Oral Care Protocol followed (see row info): Yes   Dysphagia Treatment Treatment focused on: Skilled observation of diet tolerance;Patient/family/caregiver education Family/Caregiver Educated: pt Treatment Methods/Modalities: Skilled observation;Other (comment) (verbal and written education) Feeding: Able to feed self Liquids provided via: Straw Type of cueing: Verbal Amount of cueing:  (to take small boluses, verbalize to follow solids w/liquid)   GO    Donavan Burnet, MS [facility] SLP [phone]

## 2012-08-22 NOTE — Progress Notes (Signed)
Spoke with pt and spouse about d/c planning. Pt stated he wants to go home. His wife seemed supportive of his decision and asked me several questions about the homehealth services and equipment he needs. [facility] both chose [facility] to provide the [facility], HHPT/OT services.

## 2012-08-22 NOTE — Progress Notes (Signed)
Occupational Therapy Note Attempted to see pt but he states he just got up to the bathroom with nursing and requests OT check back later. Judithann Sauger OTR/L [phone] 08/22/2012

## 2012-08-22 NOTE — Progress Notes (Signed)
Subjective: Admitted with Seroquel OD and Asp PNA. Sats with me 87-88% on Rm Air Eating well. He reports breathing well despite Wet cough and congestion. I took off his O2 and  Cxs (-) Denies pain.   Objective: Vital signs in last 24 hours: Temp:  [97.4 F (36.3 C)-98.6 F (37 C)] 98.2 F (36.8 C) (09/04 0645) Pulse Rate:  [53-104] 85  (09/04 0645) Resp:  [20-21] 21  (09/04 0645) BP: (131-159)/(51-78) 159/78 mmHg (09/04 0645) SpO2:  [88 %-94 %] 91 % (09/04 0645) Weight change:  Last BM Date: 08/17/12  CBG (last 3)  No results found for this basename: GLUCAP:3 in the last 72 hours  Intake/Output from previous day:  Intake/Output Summary (Last 24 hours) at 08/22/12 0803 Last data filed at 08/21/12 1610  Gross per 24 hour  Intake      1 ml  Output    200 ml  Net   -199 ml   09/03 0701 - 09/04 0700 In: 1 [P.O.:1] Out: 200 [Urine:200]   Physical Exam General appearance: breathing comfortably with continued tachypnea, coughing Eyes: no scleral icterus  Throat: oropharynx moist without erythema  Resp: minimal bilateral expiratory wheezing and rhonchi Cardio: regular rate and rhythm  GI: soft, non-tender; bowel sounds normal; no masses, no organomegaly  Extremities: no clubbing, cyanosis; Tr edema  bilateral   Lab Results:  Basename 08/22/12 0453 08/21/12 0445  NA 140 139  K 3.7 3.9  CL 103 102  CO2 28 30  GLUCOSE 110* 105*  BUN 25* 31*  CREATININE 1.25 1.27  CALCIUM 8.8 8.5  MG -- --  PHOS -- --    No results found for this basename: AST:2,ALT:2,ALKPHOS:2,BILITOT:2,PROT:2,ALBUMIN:2 in the last 72 hours   Basename 08/22/12 0453 08/21/12 0445  WBC 15.8* 13.7*  NEUTROABS -- --  HGB 13.4 12.7*  HCT 40.5 39.1  MCV 86.2 86.3  PLT 220 229    No results found for this basename: INR,  PROTIME    No results found for this basename: CKTOTAL:3,CKMB:3,CKMBINDEX:3,TROPONINI:3 in the last 72 hours  No results found for this basename:  TSH,T4TOTAL,FREET3,T3FREE,THYROIDAB in the last 72 hours  No results found for this basename: VITAMINB12:2,FOLATE:2,FERRITIN:2,TIBC:2,IRON:2,RETICCTPCT:2 in the last 72 hours  Micro Results: Recent Results (from the past 240 hour(s))  URINE CULTURE     Status: Normal   Collection Time   08/17/12  4:29 PM      Component Value Range Status Comment   Specimen Description URINE, CATHETERIZED   Final    Special Requests NONE   Final    Culture  Setup Time 08/18/2012 05:36   Final    Colony Count NO GROWTH   Final    Culture NO GROWTH   Final    Report Status 08/19/2012 FINAL   Final   CULTURE, BLOOD (ROUTINE X 2)     Status: Normal (Preliminary result)   Collection Time   08/17/12  8:14 PM      Component Value Range Status Comment   Specimen Description BLOOD LEFT HAND   Final    Special Requests BOTTLES DRAWN AEROBIC AND ANAEROBIC 2CC   Final    Culture  Setup Time 08/18/2012 05:00   Final    Culture     Final    Value:        BLOOD CULTURE RECEIVED NO GROWTH TO DATE CULTURE WILL BE HELD FOR 5 DAYS BEFORE ISSUING A FINAL NEGATIVE REPORT   Report Status PENDING   Incomplete   CULTURE,  BLOOD (ROUTINE X 2)     Status: Normal (Preliminary result)   Collection Time   08/17/12  8:36 PM      Component Value Range Status Comment   Specimen Description BLOOD RIGHT HAND   Final    Special Requests BOTTLES DRAWN AEROBIC AND ANAEROBIC 4.5CC   Final    Culture  Setup Time 08/18/2012 05:00   Final    Culture     Final    Value:        BLOOD CULTURE RECEIVED NO GROWTH TO DATE CULTURE WILL BE HELD FOR 5 DAYS BEFORE ISSUING A FINAL NEGATIVE REPORT   Report Status PENDING   Incomplete      Studies/Results: Dg Chest 2 View  08/21/2012  *RADIOLOGY REPORT*  Clinical Data: Cough.  Follow up pneumonia.  CHEST - 2 VIEW  Comparison: 08/18/2012.  Findings: Improving pleural effusions with minimal pleural effusion identified on the lateral view.  Airspace disease at the bases, right greater than left appears  similar to prior exam. Aorta and cardiopericardial silhouette appears similar to prior.  IMPRESSION: Persistent bibasilar airspace disease/pneumonia.  Improving pleural effusions.   Original Report Authenticated By: Andreas Newport, M.D.      Medications: Scheduled:    . albuterol  2.5 mg Nebulization QID  . amoxicillin-clavulanate  1 tablet Oral BID  . aspirin EC  81 mg Oral Daily  . carbidopa-levodopa  1 tablet Oral QHS  . docusate sodium  100 mg Oral BID  . enoxaparin (LOVENOX) injection  40 mg Subcutaneous Q24H  . feeding supplement  1 Container Oral TID BM  . gabapentin  300 mg Oral TID  . guaiFENesin  600 mg Oral BID  . ipratropium  0.5 mg Nebulization QID  . levothyroxine  112 mcg Oral Q breakfast  . mirtazapine  30 mg Oral QHS  . predniSONE  40 mg Oral QAC breakfast  . QUEtiapine  25 mg Oral QHS  . sertraline  100 mg Oral Daily  . simvastatin  40 mg Oral QHS  . sodium chloride  3 mL Intravenous Q12H   Continuous:    Assessment/Plan: Principal Problem:  *Aspiration pneumonia Active Problems:  HYPOTHYROIDISM  HYPERTENSION  Toxic encephalopathy  COPD with acute exacerbation  Sacral decubitus ulcer  Chronic pain  Anxiety disorder  Chronic kidney disease, stage III (moderate)   1. *Aspiration pneumonia- Now on Augmentin, Nebs, O2 and Steroids. Repeat CXR yesterday = Persistent bibasilar airspace disease/pneumonia.  Improving pleural effusions. Weaning O2 has been difficult as his Hypoxia and Tachypnea will not allow - on D/C he will need FIO2.[place]if[place]on Asters Count improving to stable. Diet per ST with aspiration precautions  2. Toxic encephalopathy- Back on home Psych drugs and on appropriate doses of serqoquel and Neurontin (he was taking excessive amounts at home).  3. COPD (05/2009 PFTs FVC 2.47L, FEV1 1.53L, FEV1/FVC 86% 20-40% improvement post Bonchodilators) with acute exacerbation- improving on oral steroids. Continue nebs, antibiotics, wean O2.  4. Sacral decubitus  ulcer- See Wound care consult note. 5. Chronic pain- continue Neurontin for now.  6. HYPOTHYROIDISM- continue synthroid.  7. Anxiety disorder- continue sertraline and low dose seroquel.  8. Acute on Chronic kidney disease, stage III (moderate)- Cr 1.25 - 1.27 and fine.  10. Protein calorie malnutrition: Ensure pudding.  See Nutrition note 11 Migraine HA - Under Control 12 PAD-RAS s/p PCI 7/06,  13 Peripheral neuropathy,  14 Smoker - needs to quit for good - Counceled. 15 GERD complicated by peptic stricture/ esophageal  diverticula,  16. HTN - (131-159)/(51-78) 159/78 mmHg ,  17. RLS - Sinemet. 18. Insomnia - On Mult meds.  19. Deconditioning/Disposition - continue PT/OT- anticipate discharge over next day or so.  He wants to go home today and I came in ready to discuss.  [name] does not think she can handle him the way he is.  He is still weak and frail and needing of Respiratory treatments. Will have CW discuss with family and consider short term SNF Wife [name] Cell [phone] I Consulted care management b/c i need someone to start conversation with Wife pt @ Home health vrs Rehab. If they choose SNF - I need FL2 and a bed for tomorrow (if possible).  I told pt that I think his recovery will be slow and he would benefit more from SNF/Rehab > HH.  He will probably elect home anyway.  Wife is leaning towards Rehab.  If he leaves he will need O2, HHRn, HH PT/OT and nebulizer machine with meds.     ID -  Anti-infectives     Start     Dose/Rate Route Frequency Ordered Stop   08/21/12 1000  amoxicillin-clavulanate (AUGMENTIN) 500-125 MG per tablet 500 mg       1 tablet Oral 2 times daily 08/21/12 0659     08/18/12 1100   piperacillin-tazobactam (ZOSYN) IVPB 3.375 g  Status:  Discontinued        3.375 g 12.5 mL/hr over 240 Minutes Intravenous Every 8 hours 08/18/12 0711 08/21/12 0659   08/18/12 0000   piperacillin-tazobactam (ZOSYN) IVPB 3.375 g  Status:  Discontinued        3.375 g 100  mL/hr over 30 Minutes Intravenous 4 times per day 08/17/12 2058 08/18/12 0711   08/17/12 1700   Ampicillin-Sulbactam (UNASYN) 3 g in sodium chloride 0.9 % 100 mL IVPB        3 g 100 mL/hr over 60 Minutes Intravenous  Once 08/17/12 1656 08/17/12 1912         DVT Prophylaxis  Home Complete Medication List from July:  3) Desowen Oint W/cetaphil Lot 0.05 % Kit (Desonide oint-moisturizing lot) .... 2x a day  4) Hydroxyzine Hcl 25 Mg Tabs (Hydroxyzine hcl) .... 1/2 tab tid prn itch  5) Hydroxyzine Pamoate 25 Mg Caps (Hydroxyzine pamoate) .... 1/2 pill every 8 hrs as needed for itching, caution on sedation with other meds  6) Silver Sulfadiazine 1 % Crea (Silver sulfadiazine) .... Apply to skin as directed daily  7) Gabapentin 300 Mg Caps (Gabapentin) .... Take 1 tablet by mouth 3 times a day  8) Propranolol Hcl 20 Mg Tabs (Propranolol hcl) .... 2-3 po qd prn  9) Carbidopa-levodopa 10-100 Mg Tabs (Carbidopa-levodopa) .... Take one tablet before bedtime  10) Clonazepam 1 Mg Tabs (Clonazepam) .... Take 1/2 tablet in the morning, 1/2 at lunch and 2 tablets in the evening  11) Remeron 30 Mg Tabs (Mirtazapine) .... Take one tablet before bedtime  12) Seroquel 50 Mg Tabs (Quetiapine fumarate) .Marland Kitchen.. 1 po qhs  13) Simvastatin 40 Mg Tabs (Simvastatin) .... Take one tablet at bedtime  14) Synthroid 112 Mcg Tabs (Levothyroxine sodium) .... Take one tablet daily  15) Sertraline Hcl 100 Mg Tabs (Sertraline hcl) .Marland Kitchen.. 1 po qd  16) Aspirin 81 Mg Ec Tab (Aspirin) .... Take one (1) tablet by mouth daily  17) Melatonin 3 Mg Caps (Melatonin) .Marland Kitchen.. 1 capsule by mouth at night as needed  18) Advair Diskus 250-50 Mcg/dose Aepb (Fluticasone-salmeterol) .... One inhalation  bid  19) Viagra 100 Mg Tabs (Sildenafil citrate) .... One po daily prn  20) Furosemide 20 Mg Tabs (Furosemide) .... One po daily prn edema     LOS: 5 days   Brendi Mccarroll M 08/22/2012, 8:03 AM

## 2012-08-22 NOTE — Progress Notes (Signed)
PT Cancellation Note  Treatment cancelled today due to pt too fatigued from up/down to [facility] all day.  Checked on pt x2 and declined both times.  Will check back as schedule permits.  Crystelle Ferrufino,KATHrine E 08/22/2012, 2:12 PM Pager: [phone]

## 2012-08-23 LAB — BASIC METABOLIC PANEL
BUN: 23 mg/dL (ref 6–23)
GFR calc non Af Amer: 46 mL/min — ABNORMAL LOW (ref >90)
Glucose, Bld: 110 mg/dL — ABNORMAL HIGH (ref 70–99)
Potassium: 4 mEq/L (ref 3.5–5.1)

## 2012-08-23 LAB — CBC
HCT: 42.9 % (ref 39.0–52.0)
Hemoglobin: 14.2 g/dL (ref 13.0–17.0)
MCHC: 33.1 g/dL (ref 30.0–36.0)

## 2012-08-23 MED ORDER — AMOXICILLIN-POT CLAVULANATE 500-125 MG PO TABS: 1.0000 | ORAL_TABLET | Freq: Two times a day (BID) | ORAL | Status: AC

## 2012-08-23 MED ORDER — FUROSEMIDE 20 MG PO TABS: ORAL_TABLET | ORAL | Status: DC

## 2012-08-23 MED ORDER — FLUTICASONE-SALMETEROL 250-50 MCG/DOSE IN AEPB: 1.0000 | INHALATION_SPRAY | Freq: Two times a day (BID) | RESPIRATORY_TRACT | Status: DC

## 2012-08-23 MED ORDER — PREDNISONE 20 MG PO TABS: ORAL_TABLET | ORAL | Status: DC

## 2012-08-23 MED ORDER — ALBUTEROL SULFATE (5 MG/ML) 0.5% IN NEBU: INHALATION_SOLUTION | RESPIRATORY_TRACT | Status: DC

## 2012-08-23 MED ORDER — OMEPRAZOLE 20 MG PO CPDR: 20.0000 mg | DELAYED_RELEASE_CAPSULE | Freq: Every day | ORAL | Status: DC

## 2012-08-23 MED ORDER — ACETAMINOPHEN 325 MG PO TABS: 650.0000 mg | ORAL_TABLET | Freq: Four times a day (QID) | ORAL | Status: DC | PRN

## 2012-08-23 MED ORDER — ENSURE PUDDING PO PUDG: 1.0000 | Freq: Three times a day (TID) | ORAL | Status: AC

## 2012-08-23 MED ORDER — CLONAZEPAM 1 MG PO TABS: 1.0000 mg | ORAL_TABLET | Freq: Two times a day (BID) | ORAL | Status: DC | PRN

## 2012-08-23 MED ORDER — IPRATROPIUM BROMIDE 0.02 % IN SOLN: RESPIRATORY_TRACT | Status: DC

## 2012-08-23 MED ORDER — GUAIFENESIN ER 600 MG PO TB12: 600.0000 mg | ORAL_TABLET | Freq: Two times a day (BID) | ORAL | Status: DC

## 2012-08-23 NOTE — Progress Notes (Signed)
Physical Therapy Treatment Patient Details Name: KALUB MORILLO MRN: [id number] DOB: 14-Dec-1929 Today's Date: 08/23/2012 Time: 0940-1005 PT Time Calculation (min): 25 min  PT Assessment / Plan / Recommendation Comments on Treatment Session  Pt plans to D/C to home today.  RW delivered and in room.    Follow Up Recommendations  Supervision/Assistance - 24 hour;Home health PT    Barriers to Discharge        Equipment Recommendations  Rolling walker with 5" wheels    Recommendations for Other Services    Frequency Min 3X/week   Plan Discharge plan remains appropriate    Precautions / Restrictions Precautions Precautions: Fall Precaution Comments: monitor O2 sats Restrictions Weight Bearing Restrictions: No    Pertinent Vitals/Pain No c/o pain     Mobility  Bed Mobility Bed Mobility: Not assessed Details for Bed Mobility Assistance: Pt OOB in recliner  Transfers Transfers: Sit to Stand;Stand to Sit Sit to Stand: 5: Supervision;From toilet;From chair/3-in-1 Stand to Sit: 5: Supervision;To chair/3-in-1;To toilet Details for Transfer Assistance: <25% VC's on safety with turns  Ambulation/Gait Ambulation/Gait Assistance: 4: Min guard Ambulation Distance (Feet): 85 Feet Assistive device: Rolling walker Ambulation/Gait Assistance Details: Monitoring RA sats range 88 - 90% with mod c/o SOB and noted 3/4 DOE plus active cough increased with activity.  Pt requires increased time and freq rest breaks to recover. Gait Pattern: Step-through pattern;Trunk flexed Gait velocity: decreased    PT Goals                              progressing    Visit Information  Last PT Received On: 08/23/12 Assistance Needed: +1    Subjective Data      Cognition  Overall Cognitive Status: Impaired Area of Impairment: Safety/judgement;Awareness of errors Arousal/Alertness: Awake/alert Orientation Level: Appears intact for tasks assessed Behavior During Session: [facility] for tasks  performed Safety/Judgement: Decreased awareness of safety precautions;Decreased safety judgement for tasks assessed    Balance   fair  End of Session PT - End of Session Equipment Utilized During Treatment: Gait belt Activity Tolerance: Patient tolerated treatment well Patient left: in chair;with call bell/phone within reach   Felecia Shelling  PTA [facility]  Acute  Rehab Pager     [phone]

## 2012-08-23 NOTE — Discharge Summary (Signed)
Physician Discharge Summary  DISCHARGE SUMMARY   Patient ID: [name] MR#: [id number] DOB/AGE: 22-Nov-1929 78 y.o.   Attending Physician:Lajean Boese M  Patient's JXB:JYNWG,NFAO M, MD  Consults: None  Admit date: 08/17/2012 Discharge date: 08/23/2012  Discharge Diagnoses:  Principal Problem:  *Aspiration pneumonia Active Problems:  HYPOTHYROIDISM  HYPERTENSION  Toxic encephalopathy  COPD with acute exacerbation  Sacral decubitus ulcer  Chronic pain  Anxiety disorder  Chronic kidney disease, stage III (moderate)   Patient Active Problem List  Diagnosis  . TUBULOVILLOUS ADENOMA, COLON  . HYPOTHYROIDISM  . DYSLIPIDEMIA  . ANXIETY DEPRESSION  . RHEUMATIC FEVER  . HYPERTENSION  . ATHEROSCLEROSIS  . COPD  . ESOPHAGEAL MOTILITY DISORDER  . GERD  . ESOPHAGEAL DIVERTICULUM  . INGUINAL HERNIAS, BILATERAL  . HERNIA, UMBILICAL  . DIVERTICULOSIS OF COLON  . MELANOSIS COLI  . CHOLELITHIASIS  . UNSPECIFIED RENAL SCLEROSIS  . SLEEP APNEA  . Aspiration pneumonia  . Toxic encephalopathy  . COPD with acute exacerbation  . Sacral decubitus ulcer  . Chronic pain  . Anxiety disorder  . Chronic kidney disease, stage III (moderate)   Past Medical History  Diagnosis Date  . Hyperlipidemia   . COPD (chronic obstructive pulmonary disease)   . Leg pain   . GERD (gastroesophageal reflux disease)   . Depression   . Dizziness   . Productive cough   . Thyroid disease   . Peripheral vascular disease   . Chronic kidney disease   . ED (erectile dysfunction)   . Hypertension     08/17/12 - wife denies    Discharged Condition: fair   Discharge Medications: Medication List  As of 08/23/2012  8:17 AM   TAKE these medications         acetaminophen 325 MG tablet   Commonly known as: TYLENOL   Take 2 tablets (650 mg total) by mouth every 6 (six) hours as needed for pain or fever (or Fever >/= 101).      albuterol (5 MG/ML) 0.5% nebulizer solution   Commonly known as:  PROVENTIL   Take 0.5 mLs (2.5 mg total) by nebulization 4 (four) times daily and prn congestion and wheezing.  We will wean this to prn only over time.      amoxicillin-clavulanate 500-125 MG per tablet   Commonly known as: AUGMENTIN   Take 1 tablet (500 mg total) by mouth 2 (two) times daily.      aspirin EC 81 MG tablet   Take 81 mg by mouth daily.      carbidopa-levodopa 10-100 MG per tablet   Commonly known as: SINEMET IR   Take 1 tablet by mouth daily.      clonazePAM 1 MG tablet   Commonly known as: KLONOPIN   Take 1 tablet (1 mg total) by mouth 2 (two) times daily as needed for anxiety (anxiety).      feeding supplement Pudg   Take 1 Container by mouth 3 (three) times daily between meals.      Fluticasone-Salmeterol 250-50 MCG/DOSE Aepb   Commonly known as: ADVAIR   Inhale 1 puff into the lungs 2 (two) times daily.      furosemide 20 MG tablet   Commonly known as: LASIX   One po daily prn edema      gabapentin 300 MG capsule   Commonly known as: NEURONTIN   Take 300 mg by mouth 3 (three) times daily.      guaiFENesin 600 MG 12 hr tablet  Commonly known as: MUCINEX   Take 1 tablet (600 mg total) by mouth 2 (two) times daily.      ipratropium 0.02 % nebulizer solution   Commonly known as: ATROVENT   Take 2.5 mLs (0.5 mg total) by nebulization 4 (four) times daily. We will wean to as needed over the next 1-2 weeks.      levothyroxine 112 MCG tablet   Commonly known as: SYNTHROID, LEVOTHROID   Take 112 mcg by mouth daily.      Melatonin 3 MG Caps   Take 3 mg by mouth daily.      mirtazapine 30 MG tablet   Commonly known as: REMERON   Take 30 mg by mouth at bedtime.      omeprazole 20 MG capsule   Commonly known as: PRILOSEC   Take 1 capsule (20 mg total) by mouth daily.      predniSONE 20 MG tablet   Commonly known as: DELTASONE   10 mg tabs. 3 po daily times 4 days, then 2 PO daily times one week, then 1 po daily times 1 week, then 1/2 po daily for 6  days.      QUEtiapine 25 MG tablet   Commonly known as: SEROQUEL   Take 25 mg by mouth at bedtime.      sertraline 100 MG tablet   Commonly known as: ZOLOFT   Take 100 mg by mouth daily.      simvastatin 40 MG tablet   Commonly known as: ZOCOR   Take 40 mg by mouth at bedtime.            Hospital Procedures: Dg Chest 2 View  08/21/2012  *RADIOLOGY REPORT*  Clinical Data: Cough.  Follow up pneumonia.  CHEST - 2 VIEW  Comparison: 08/18/2012.  Findings: Improving pleural effusions with minimal pleural effusion identified on the lateral view.  Airspace disease at the bases, right greater than left appears similar to prior exam. Aorta and cardiopericardial silhouette appears similar to prior.  IMPRESSION: Persistent bibasilar airspace disease/pneumonia.  Improving pleural effusions.   Original Report Authenticated By: Andreas Newport, M.D.    Dg Chest 2 View  08/18/2012  *RADIOLOGY REPORT*  Clinical Data: The patient pneumonia.  Short of breath.  CHEST - 2 VIEW  Comparison: 08/17/2012.  Findings: Small bilateral pleural effusions are evident on the lateral view.  Bilateral basilar airspace disease and atelectasis is present.  There may be slight improvement in pulmonary aeration compared to yesterday's examination.  Tortuous thoracic aorta with ectatic aortic arch. Aortic arch configuration appears similar to prior exams dating back 07/09/2010.  Cardiomegaly is present. Monitoring leads are projected over the chest.  IMPRESSION: Minimal improvement pulmonary aeration with persistent bilateral effusions, basilar airspace disease and atelectasis.   Original Report Authenticated By: Andreas Newport, M.D.    Dg Chest Port 1 View  08/17/2012  *RADIOLOGY REPORT*  Clinical Data: Drug overdose.  Unresponsive.  COPD.  PORTABLE CHEST - 1 VIEW  Comparison: 07/09/2010  Findings: Airspace opacities present in both lung bases, left greater than right, with mild obscuration of left hemidiaphragm.  Cardiomegaly  noted.  Atherosclerotic calcification in the aortic arch present.  Indistinct pulmonary vasculature mild interstitial accentuation noted in both lungs.  Thoracic spondylosis is present.  IMPRESSION:  1.  Interstitial accentuation in the lungs, slightly more confluent in the lung bases.  There is mild left basilar airspace opacity. The appearance could be due to drug reaction, edema, aspiration pneumonitis, or pneumonia. 2.  Mild  cardiomegaly. 3.  Thoracic spondylosis.   Original Report Authenticated By: Dellia Cloud, M.D.     History of Present Illness: Mr. Krysiak is an 78 year old white male with a history of COPD, chronic pain secondary to sacral ulcer, and generalized anxiety who presented to the emergency department on 8/30 with increased confusion. Patient's wife stated that he had been very agitated over the past week has been taking Seroquel every 2 hours to try to combat this. She stated that this source of his agitation is his chronic sacral pain secondary to eczema. He has been taking extra Neurontin as well trying to combat this pain. In addition, she has noticed an increasing cough with sputum production. He was treated  3 weeks prior to admission with a course of prednisone and Cefdinir for COPD exacerbation. He initially improved until about 3 days prior to admit when his cough returned associated with increased wheezing and shortness of breath. He called Dr. Timothy Lasso and was placed on Cefdinir again without prednisone this time. His cough had not improved and he continued to take his Seroquel and Neurontin.  He become increasingly somnolent and confused. She brought him to the emergency department where he was found have a  left lower lobe infiltrate, likely secondary to aspiration. Since arriving to the emergency department his mental status cleared significantly.   Hospital Course: Admitted with Seroquel OD, Asp PNA, Hypoxia, COPD Exacerbation, deconditioning and FTT.  He was treated  with steroids, O2, Abx, pulmonary toilet, and nebs.  He slowly improved.  His toxic metabolic encephalopathy improved with appropriate dosing of his meds and he was told to take meds only as directed.  His Abx and meds were switched from IV to oral and we started to work on weakness/Adult FTT/Deconditioning and got PT/OT/CW involved.  I thought he would benefit from short SNF stay.  He prefers to go home.  We will transition today.  Despite his improvements he is still ill and his sats with me 87-88% on Rm Air yesterday am and he qualifies for Outpt FIO2 until he improves.  He will also need home Nebs, 3 n1, Rolling walker, PT/OT/and RN.  He is eating well and has been seen per Nutrition and ST.  They added Diet: Dysphagia 3 (mechanical soft);Thin liquid and Boost/Ensure. He is  Supposed to be on PPI per Dr Marina Goodell but stopped due to $ and now treats GERD with Tums.  Will add PPI back - OTC Prilosec.  He reports breathing well despite Wet cough and congestion.  His Leukocytosis improved some and labs and Vts are fine.  He has been using a walker as well. Cxs (-)  Denies pain. He was seen by Wound care as well for his Eczema/Sacral issues.  1. *Aspiration pneumonia- Now on Augmentin, Nebs, O2 and Steroids. Repeat CXR 08/22/12 = Persistent bibasilar airspace disease/pneumonia. Improving pleural effusions. Weaning O2 has been difficult as his Hypoxia and Tachypnea will not allow - on D/C he will need FIO2.[place] Asters Count improving to stable = 13.5. Diet per ST with aspiration precautions  2. Toxic encephalopathy- Back on home Psych drugs and on appropriate doses of serqoquel and Neurontin (he was taking excessive amounts at home).  Follow up with Dr Jennelle Human as outpatient.  3. COPD (05/2009 PFTs FVC 2.47L, FEV1 1.53L, FEV1/FVC 86% 20-40% improvement post Bonchodilators) with acute exacerbation- improving on oral steroids. Continue nebs, antibiotics, wean O2.  4. Sacral decubitus ulcer- See Wound care consult note.    5. Chronic pain-  continue Neurontin for now.  6. HYPOTHYROIDISM- continue synthroid.  7. Anxiety disorder- continue sertraline and low dose seroquel.  8. Acute on Chronic kidney disease, stage III (moderate)- Cr 1.25 - 1.38 and fine.  10. Protein calorie malnutrition: Ensure pudding. See Nutrition note  11 Migraine HA - Under Control  12 PAD-RAS s/p PCI 7/06,  13 Peripheral neuropathy,  14 Smoker - needs to quit for good - Counceled.  15 GERD complicated by peptic stricture/ esophageal diverticula - I added PPI back 16. HTN - Re-eval BP as outpt and decide on med adjustments. 17. RLS - Sinemet.  18. Insomnia - On Mult meds.  19. Deconditioning/Disposition - Discharge today with [facility].  I think his recovery will be slow and he would benefit more from SNF/Rehab > HH. He will elected home anyway.  He will need O2, HHRn, HH PT/OT and nebulizer machine with meds.  Seen on rounds 08/23/12 and he actually looked and sounded better.  He is OK for D/C - He did not have and incentive spirometer in the room and I brought one in. He should use it as an outpatient.  He should see Dr Jennelle Human quickly because overdosing on psychiatric medicines to alleviate pain (not suicidal) was dumb and dangerous.   Day of Discharge Exam BP 166/71  Pulse 82  Temp 98.1 F (36.7 C) (Oral)  Resp 18  Ht 6' (1.829 m)  Wt 96.163 kg (212 lb)  BMI 28.75 kg/m2  SpO2 93%  Physical Exam: General appearance: breathing comfortably with continued tachypnea, coughing  Eyes: no scleral icterus  Throat: oropharynx moist without erythema  Resp: minimal bilateral expiratory wheezing and rhonchi  Cardio: regular rate and rhythm  GI: soft, non-tender; bowel sounds normal; no masses, no organomegaly  Extremities: no clubbing, cyanosis; Tr edema bilateral    Discharge Labs:  [facility] 08/23/12 0450 08/22/12 0453  NA 140 140  K 4.0 3.7  CL 103 103  CO2 30 28  GLUCOSE 110* 110*  BUN 23 25*  CREATININE 1.38* 1.25   CALCIUM 8.8 8.8  MG -- --  PHOS -- --   No results found for this basename: AST:2,ALT:2,ALKPHOS:2,BILITOT:2,PROT:2,ALBUMIN:2 in the last 72 hours  Basename 08/23/12 0450 08/22/12 0453  WBC 13.5* 15.8*  NEUTROABS -- --  HGB 14.2 13.4  HCT 42.9 40.5  MCV 86.1 86.2  PLT 264 220   No results found for this basename: CKTOTAL:3,CKMB:3,CKMBINDEX:3,TROPONINI:3 in the last 72 hours No results found for this basename: TSH,T4TOTAL,FREET3,T3FREE,THYROIDAB in the last 72 hours No results found for this basename: VITAMINB12:2,FOLATE:2,FERRITIN:2,TIBC:2,IRON:2,RETICCTPCT:2 in the last 72 hours No results found for this basename: INR,  PROTIME       Discharge instructions: Discharge Orders    Future Orders Please Complete By Expires   For home use only DME oxygen      Questions: Responses:   Mode or (Route) Nasal cannula   Liters per Minute 2   Frequency Continuous      Follow-up Information    Follow up with Gwen Pounds, MD in 2 weeks.   Contact information:   [address], [place]. [place] [address] [phone]           Disposition: Home  Follow-up Appts: Follow-up with Dr. Timothy Lasso at [facility] in 1-2 weeks.  Call for appointment.  Condition on Discharge: fair  Tests Needing Follow-up: Labs and O2 and CXR  Time spent in discharge (includes decision making & examination of pt): 40 min  Signed: Gwen Pounds 08/23/2012, 8:17 AM

## 2012-08-23 NOTE — Progress Notes (Addendum)
LATE ENTRY FOR 08/22/12  Pt's sats were 86% on room air at rest.

## 2012-08-23 NOTE — Progress Notes (Signed)
Occupational Therapy Treatment Patient Details Name: [name] MRN: [id number] DOB: 1929-02-08 Today's Date: 08/23/2012 Time: 4098-1191 OT Time Calculation (min): 15 min  OT Assessment / Plan / Recommendation Comments on Treatment Session Pt tolerated session well. Is supposed to be discharging today.     Follow Up Recommendations  Home health OT;Supervision/Assistance - 24 hour    Barriers to Discharge       Equipment Recommendations  Rolling walker with 5" wheels    Recommendations for Other Services    Frequency Min 2X/week   Plan Discharge plan remains appropriate    Precautions / Restrictions Precautions Precautions: Fall Precaution Comments: monitor O2 sats Restrictions Weight Bearing Restrictions: No        ADL  Upper Body Dressing: Performed;Set up Where Assessed - Upper Body Dressing: Unsupported sitting Lower Body Dressing: Performed;Min guard Where Assessed - Lower Body Dressing: Supported sit to stand Toilet Transfer: Performed;Min guard;Other (comment) (with contined vcs for safety) Toilet Transfer Equipment: Comfort height toilet;Grab bars Equipment Used: Rolling walker ADL Comments: Wife present and pt discharging home today. Emphasized to pt and wife importance of using RW at all times when up as he still tends to leave it to the side as he enters bathroom and tries to step around to toilet without it. Had pt sit and stand from comfort height commode with grab bar  (he has a vanity beside at home) and pt did better with this today. He used the bar to guide himself to sitting and then pushed up on his thighs to stand with no grab bar use. Dont feel he will need 3in1 after seeing pt today. Wife reports new information that he does have a built in shower seat. The concern would be whether he can stand from the seat depending on its height as he doesnt have grab bars inside and if he can safely get to the back of the shower without a bar. Pt and wife have  agreed to let [facility] assess shower transfer before attempting on their own. Explained that if pt can safely transfer to seat in shower (after HHOT assesses) he would benefit from seat for energy conservation.     OT Diagnosis:    OT Problem List:   OT Treatment Interventions:     OT Goals ADL Goals ADL Goal: Upper Body Dressing - Progress: Met ADL Goal: Lower Body Dressing - Progress: Progressing toward goals ADL Goal: Toilet Transfer - Progress: Progressing toward goals  Visit Information  Last OT Received On: 08/23/12 Assistance Needed: +1    Subjective Data  Subjective: I am ready to go home Patient Stated Goal: home   Prior Functioning       Cognition  Overall Cognitive Status: Impaired Area of Impairment: Safety/judgement;Awareness of errors Arousal/Alertness: Awake/alert Orientation Level: Appears intact for tasks assessed Behavior During Session: [facility] for tasks performed Safety/Judgement: Decreased awareness of safety precautions;Decreased safety judgement for tasks assessed    Mobility  Shoulder Instructions Transfers Transfers: Sit to Stand;Stand to Sit Sit to Stand: 5: Supervision;From bed;From toilet;With upper extremity assist Stand to Sit: 5: Supervision;With upper extremity assist;To bed;To toilet Details for Transfer Assistance: verbal cues for hand placement and step back to surface completely before sitting.        Exercises      Balance     End of Session OT - End of Session Activity Tolerance: Patient tolerated treatment well Patient left: Other (comment);with family/visitor present (at EOB with wife)  GO  Lennox Laity [phone] 08/23/2012, 12:13 PM

## 2012-08-23 NOTE — Progress Notes (Signed)
Pt and his wife voice understanding of d/c instructions.  No changes noted since am assessment.

## 2012-08-24 LAB — CULTURE, BLOOD (ROUTINE X 2): Culture: NO GROWTH

## 2012-09-07 ENCOUNTER — Encounter: Payer: Self-pay | Admitting: Internal Medicine

## 2012-09-10 ENCOUNTER — Encounter: Payer: Self-pay | Admitting: Internal Medicine

## 2012-10-22 ENCOUNTER — Telehealth: Payer: Self-pay | Admitting: Vascular Surgery

## 2012-10-22 NOTE — Telephone Encounter (Signed)
Message copied by Margaretmary Eddy on Mon Oct 22, 2012  4:24 PM ------      Message from: Melene Plan      Created: Mon Oct 22, 2012  4:12 PM      Regarding: RE: Patient has redness/swelling of feet and legs      Contact: [phone]       WHEN CAN TFE SEE HIM WITH LAB      ----- Message -----         From: Margaretmary Eddy         Sent: 10/22/2012   3:05 PM           To: Melene Plan, RN      Subject: Patient has redness/swelling of feet and legs            Patient's wife called. Pt having swelling and redness of feet and legs TFE patient, wants to schedule an appointment, let me know if you want me to do anything - thanks!

## 2012-10-23 ENCOUNTER — Encounter: Payer: Self-pay | Admitting: Vascular Surgery

## 2012-10-23 ENCOUNTER — Ambulatory Visit (INDEPENDENT_AMBULATORY_CARE_PROVIDER_SITE_OTHER): Payer: Medicare Other | Admitting: Vascular Surgery

## 2012-10-23 ENCOUNTER — Encounter (INDEPENDENT_AMBULATORY_CARE_PROVIDER_SITE_OTHER): Payer: Medicare Other | Admitting: Vascular Surgery

## 2012-10-23 VITALS — BP 144/70 | HR 88 | Ht 72.0 in | Wt 233.0 lb

## 2012-10-23 DIAGNOSIS — M7989 Other specified soft tissue disorders: Secondary | ICD-10-CM

## 2012-10-23 DIAGNOSIS — R600 Localized edema: Secondary | ICD-10-CM | POA: Insufficient documentation

## 2012-10-23 DIAGNOSIS — R609 Edema, unspecified: Secondary | ICD-10-CM

## 2012-10-23 DIAGNOSIS — I739 Peripheral vascular disease, unspecified: Secondary | ICD-10-CM

## 2012-10-23 DIAGNOSIS — L98499 Non-pressure chronic ulcer of skin of other sites with unspecified severity: Secondary | ICD-10-CM

## 2012-10-23 DIAGNOSIS — I7025 Atherosclerosis of native arteries of other extremities with ulceration: Secondary | ICD-10-CM | POA: Insufficient documentation

## 2012-10-23 NOTE — Progress Notes (Unsigned)
Bilateral lower extremity venous duplex performed @ [facility] 10/23/2012

## 2012-10-23 NOTE — Progress Notes (Signed)
Patient is a for evaluation of lower for the swelling. He is well known to me from a long history of moderate peripheral vascular occlusive disease. He has never required treatment of this. He does have an ankle arm indices and the 0.6 0.7 range. He reports he has had increasingly severe bilateral lower surety swelling. He is unable to tolerate diuretic treatment. He does report that he was hospitalized with pneumonia and while bedridden had resolution of his lower surety swelling. He does not have any history of DVT  Past Medical History  Diagnosis Date  . Hyperlipidemia   . COPD (chronic obstructive pulmonary disease)   . Leg pain   . GERD (gastroesophageal reflux disease)   . Depression   . Dizziness   . Productive cough   . Thyroid disease   . Peripheral vascular disease   . Chronic kidney disease   . ED (erectile dysfunction)   . Hypertension     08/17/12 - wife denies    History  Substance Use Topics  . Smoking status: Former Smoker -- 1.0 packs/day for 65 years    Types: Cigarettes    Quit date: 08/17/2012  . Smokeless tobacco: Never Used  . Alcohol Use: No    Family History  Problem Relation Age of Onset  . Other Mother     AAA  . AAA (abdominal aortic aneurysm) Mother   . Heart attack Father   . Alcohol abuse Father   . Diabetes Father   . Other Father     amputation  . Diabetes Brother   . Coronary artery disease Brother   . Dementia Brother     No Known Allergies  Current outpatient prescriptions:acetaminophen (TYLENOL) 325 MG tablet, Take 2 tablets (650 mg total) by mouth every 6 (six) hours as needed for pain or fever (or Fever >/= 101)., Disp: , Rfl: ;  albuterol (PROVENTIL) (5 MG/ML) 0.5% nebulizer solution, Take 0.5 mLs (2.5 mg total) by nebulization 4 (four) times daily and prn congestion and wheezing.  We will wean this to prn only over time., Disp: 20 mL, Rfl: 4 aspirin EC 81 MG tablet, Take 81 mg by mouth daily. , Disp: , Rfl: ;  carbidopa-levodopa  (SINEMET) 10-100 MG per tablet, Take 1 tablet by mouth daily. , Disp: , Rfl: ;  feeding supplement (ENSURE) PUDG, Take 1 Container by mouth 3 (three) times daily between meals., Disp: , Rfl: ;  Fluticasone-Salmeterol (ADVAIR DISKUS) 250-50 MCG/DOSE AEPB, Inhale 1 puff into the lungs 2 (two) times daily., Disp: 60 each, Rfl:  gabapentin (NEURONTIN) 300 MG capsule, Take 300 mg by mouth 3 (three) times daily., Disp: , Rfl: ;  levothyroxine (SYNTHROID, LEVOTHROID) 112 MCG tablet, Take 112 mcg by mouth daily. , Disp: , Rfl: ;  Melatonin 3 MG CAPS, Take 3 mg by mouth daily.  , Disp: , Rfl: ;  mirtazapine (REMERON) 30 MG tablet, Take 30 mg by mouth at bedtime.  , Disp: , Rfl: ;  propranolol (INDERAL) 20 MG tablet, Take 20 mg by mouth., Disp: , Rfl:  QUEtiapine (SEROQUEL) 25 MG tablet, Take 25 mg by mouth at bedtime. , Disp: , Rfl: ;  sertraline (ZOLOFT) 100 MG tablet, Take 100 mg by mouth daily.  , Disp: , Rfl: ;  simvastatin (ZOCOR) 40 MG tablet, Take 40 mg by mouth at bedtime. , Disp: , Rfl: ;  traMADol (ULTRAM) 50 MG tablet, Take 50 mg by mouth daily., Disp: , Rfl:  clonazePAM (KLONOPIN) 1 MG tablet, Take  1 tablet (1 mg total) by mouth 2 (two) times daily as needed for anxiety (anxiety)., Disp: 30 tablet, Rfl: ;  furosemide (LASIX) 20 MG tablet, One po daily prn edema, Disp: 30 tablet, Rfl: ;  guaiFENesin (MUCINEX) 600 MG 12 hr tablet, Take 1 tablet (600 mg total) by mouth 2 (two) times daily., Disp: , Rfl:  ipratropium (ATROVENT) 0.02 % nebulizer solution, Take 2.5 mLs (0.5 mg total) by nebulization 4 (four) times daily. We will wean to as needed over the next 1-2 weeks., Disp: 75 mL, Rfl: 4;  omeprazole (PRILOSEC) 20 MG capsule, Take 1 capsule (20 mg total) by mouth daily., Disp: , Rfl:  predniSONE (DELTASONE) 20 MG tablet, 10 mg tabs. 3 po daily times 4 days, then 2 PO daily times one week, then 1 po daily times 1 week, then 1/2 po daily for 6 days., Disp: 40 tablet, Rfl: 0  BP 144/70  Pulse 88  Ht 6' (1.829  m)  Wt 233 lb (105.688 kg)  BMI 31.60 kg/m2  SpO2 100%  Body mass index is 31.60 kg/(m^2).       Physical exam: Well-developed well-nourished white male in no acute distress Grossly intact neurologically Absent pedal pulses bilaterally Marked pitting edema bilaterally left greater than right with some mild erythema in the left leg without any evidence of active infection  Noninvasive-yesterday shows no evidence of DVT and also no evidence of reflux in his left deep or superficial system. On the right he has mild reflux in his saphenous vein and popliteal vein  Impression and plan no evidence of significant venous pathology to account for his of bilateral swelling. This does appear to be related to fluid overload. I did express importance of elevation with possible with his legs higher than his heart. Also explained the importance of diuresis as much as possible. He has worn compression garments in the past and has a difficult time tolerating these. I explained the option of ace wrap from the foot onto the cath as well. He understands and we'll see [place] on an as-needed basis

## 2012-12-07 ENCOUNTER — Encounter (HOSPITAL_BASED_OUTPATIENT_CLINIC_OR_DEPARTMENT_OTHER): Payer: Medicare Other | Attending: General Surgery

## 2012-12-07 DIAGNOSIS — G5790 Unspecified mononeuropathy of unspecified lower limb: Secondary | ICD-10-CM | POA: Insufficient documentation

## 2012-12-07 DIAGNOSIS — L02419 Cutaneous abscess of limb, unspecified: Secondary | ICD-10-CM | POA: Insufficient documentation

## 2012-12-07 DIAGNOSIS — I87309 Chronic venous hypertension (idiopathic) without complications of unspecified lower extremity: Secondary | ICD-10-CM | POA: Insufficient documentation

## 2012-12-07 DIAGNOSIS — L97509 Non-pressure chronic ulcer of other part of unspecified foot with unspecified severity: Secondary | ICD-10-CM | POA: Insufficient documentation

## 2012-12-07 DIAGNOSIS — L03119 Cellulitis of unspecified part of limb: Secondary | ICD-10-CM | POA: Insufficient documentation

## 2012-12-08 NOTE — Progress Notes (Signed)
[facility]  NAME:  [name], [name] NO.:  [id number]  MEDICAL RECORD NO.:  [id number]      DATE OF BIRTH:  Apr 09, 1929  PHYSICIAN:  Ardath Sax, M.D.           VISIT DATE:                                  OFFICE VISIT   Mr. [name] is an 78 year old gentleman, who comes here with probable venous hypertension in both of his legs, but he does have a neuropathic ulcer on his left foot of about a cm in diameter.  He probably has some venous ulcer related to this neuropathic ulcer as he is not a diabetic.  He comes here with a history of ulcer being on the foot for several months and really staying about the same.  It measures 1.52 x 0.9 cm and he is afebrile.  He has a pulse of 82, respirations 18, blood pressure 137/76.  He weighs 225 pounds.  He other than that is reasonably healthy.  He does have a history of chronic renal disease, peripheral vascular disease, COPD.  He had a stent placed in his left kidney when he was being worked up for his peripheral vascular disease. He also has a history of congenital syphilis.  MEDICATIONS:  Include Synthroid, aspirin, Zoloft, simvastatin, melatonin, Viagra, albuterol, gabapentin, and hydrocodone.  We are going to treat him with Coban Lite dressing and because he had a lot of swelling and cellulitis around the ulcer, we started him on doxycycline and we took a culture.  He will return in a week for recheck.  His diagnosis is probable neuropathic ulcer of his left foot.     Ardath Sax, M.D.     PP/MEDQ  D:  12/07/2012  T:  12/08/2012  Job:  [id number]

## 2012-12-17 ENCOUNTER — Other Ambulatory Visit (HOSPITAL_COMMUNITY): Payer: Self-pay | Admitting: General Surgery

## 2012-12-17 DIAGNOSIS — I739 Peripheral vascular disease, unspecified: Secondary | ICD-10-CM

## 2012-12-17 DIAGNOSIS — I998 Other disorder of circulatory system: Secondary | ICD-10-CM

## 2012-12-28 ENCOUNTER — Encounter (HOSPITAL_BASED_OUTPATIENT_CLINIC_OR_DEPARTMENT_OTHER): Payer: Medicare Other

## 2013-01-02 ENCOUNTER — Encounter (HOSPITAL_COMMUNITY): Payer: Medicare Other

## 2013-01-03 ENCOUNTER — Inpatient Hospital Stay (HOSPITAL_COMMUNITY)
Admission: AD | Admit: 2013-01-03 | Discharge: 2013-01-08 | DRG: 603 | Disposition: A | Discharge status: Home Health | Payer: Medicare Other | Source: Ambulatory Visit | Attending: Internal Medicine | Admitting: Internal Medicine

## 2013-01-03 ENCOUNTER — Inpatient Hospital Stay (HOSPITAL_COMMUNITY): Payer: Medicare Other

## 2013-01-03 DIAGNOSIS — E785 Hyperlipidemia, unspecified: Secondary | ICD-10-CM | POA: Diagnosis present

## 2013-01-03 DIAGNOSIS — G2581 Restless legs syndrome: Secondary | ICD-10-CM | POA: Diagnosis present

## 2013-01-03 DIAGNOSIS — E876 Hypokalemia: Secondary | ICD-10-CM | POA: Diagnosis present

## 2013-01-03 DIAGNOSIS — F341 Dysthymic disorder: Secondary | ICD-10-CM | POA: Diagnosis present

## 2013-01-03 DIAGNOSIS — R627 Adult failure to thrive: Secondary | ICD-10-CM | POA: Diagnosis present

## 2013-01-03 DIAGNOSIS — Z87891 Personal history of nicotine dependence: Secondary | ICD-10-CM

## 2013-01-03 DIAGNOSIS — I872 Venous insufficiency (chronic) (peripheral): Secondary | ICD-10-CM | POA: Diagnosis present

## 2013-01-03 DIAGNOSIS — G609 Hereditary and idiopathic neuropathy, unspecified: Secondary | ICD-10-CM | POA: Diagnosis present

## 2013-01-03 DIAGNOSIS — G8929 Other chronic pain: Secondary | ICD-10-CM

## 2013-01-03 DIAGNOSIS — L0390 Cellulitis, unspecified: Secondary | ICD-10-CM

## 2013-01-03 DIAGNOSIS — L97509 Non-pressure chronic ulcer of other part of unspecified foot with unspecified severity: Secondary | ICD-10-CM | POA: Diagnosis present

## 2013-01-03 DIAGNOSIS — I110 Hypertensive heart disease with heart failure: Secondary | ICD-10-CM | POA: Diagnosis present

## 2013-01-03 DIAGNOSIS — I70209 Unspecified atherosclerosis of native arteries of extremities, unspecified extremity: Secondary | ICD-10-CM | POA: Diagnosis present

## 2013-01-03 DIAGNOSIS — L02619 Cutaneous abscess of unspecified foot: Principal | ICD-10-CM | POA: Diagnosis present

## 2013-01-03 DIAGNOSIS — J4489 Other specified chronic obstructive pulmonary disease: Secondary | ICD-10-CM | POA: Diagnosis present

## 2013-01-03 DIAGNOSIS — E039 Hypothyroidism, unspecified: Secondary | ICD-10-CM | POA: Diagnosis present

## 2013-01-03 DIAGNOSIS — F411 Generalized anxiety disorder: Secondary | ICD-10-CM | POA: Diagnosis present

## 2013-01-03 DIAGNOSIS — Z7982 Long term (current) use of aspirin: Secondary | ICD-10-CM

## 2013-01-03 DIAGNOSIS — I739 Peripheral vascular disease, unspecified: Secondary | ICD-10-CM | POA: Diagnosis present

## 2013-01-03 DIAGNOSIS — F3289 Other specified depressive episodes: Secondary | ICD-10-CM | POA: Diagnosis present

## 2013-01-03 DIAGNOSIS — G4733 Obstructive sleep apnea (adult) (pediatric): Secondary | ICD-10-CM | POA: Diagnosis present

## 2013-01-03 DIAGNOSIS — I129 Hypertensive chronic kidney disease with stage 1 through stage 4 chronic kidney disease, or unspecified chronic kidney disease: Secondary | ICD-10-CM | POA: Diagnosis present

## 2013-01-03 DIAGNOSIS — K219 Gastro-esophageal reflux disease without esophagitis: Secondary | ICD-10-CM | POA: Diagnosis present

## 2013-01-03 DIAGNOSIS — J449 Chronic obstructive pulmonary disease, unspecified: Secondary | ICD-10-CM | POA: Diagnosis present

## 2013-01-03 DIAGNOSIS — N183 Chronic kidney disease, stage 3 unspecified: Secondary | ICD-10-CM | POA: Diagnosis present

## 2013-01-03 DIAGNOSIS — F329 Major depressive disorder, single episode, unspecified: Secondary | ICD-10-CM | POA: Diagnosis present

## 2013-01-03 DIAGNOSIS — Z79899 Other long term (current) drug therapy: Secondary | ICD-10-CM

## 2013-01-03 DIAGNOSIS — I7090 Unspecified atherosclerosis: Secondary | ICD-10-CM

## 2013-01-03 LAB — COMPREHENSIVE METABOLIC PANEL
ALT: 18 U/L (ref 0–53)
Alkaline Phosphatase: 68 U/L (ref 39–117)
CO2: 24 mEq/L (ref 19–32)
GFR calc Af Amer: 37 mL/min — ABNORMAL LOW (ref >90)
GFR calc non Af Amer: 32 mL/min — ABNORMAL LOW (ref >90)
Glucose, Bld: 144 mg/dL — ABNORMAL HIGH (ref 70–99)
Potassium: 3.2 mEq/L — ABNORMAL LOW (ref 3.5–5.1)
Sodium: 135 mEq/L (ref 135–145)
Total Bilirubin: 0.9 mg/dL (ref 0.3–1.2)

## 2013-01-03 LAB — CBC
HCT: 35.8 % — ABNORMAL LOW (ref 39.0–52.0)
MCHC: 33.5 g/dL (ref 30.0–36.0)
Platelets: 146 10*3/uL — ABNORMAL LOW (ref 150–400)
RDW: 15.3 % (ref 11.5–15.5)

## 2013-01-03 LAB — PROTIME-INR: Prothrombin Time: 13.8 seconds (ref 11.6–15.2)

## 2013-01-03 LAB — APTT: aPTT: 39 seconds — ABNORMAL HIGH (ref 24–37)

## 2013-01-03 MED ORDER — PIPERACILLIN-TAZOBACTAM 3.375 G IVPB
3.3750 g | Freq: Three times a day (TID) | INTRAVENOUS | Status: DC
  Administered 2013-01-04 – 2013-01-08 (×13): 3.375 g via INTRAVENOUS
  Filled 2013-01-03 (×15): qty 50

## 2013-01-03 MED ORDER — VANCOMYCIN HCL 10 G IV SOLR
1500.0000 mg | INTRAVENOUS | Status: DC
  Administered 2013-01-04 – 2013-01-06 (×3): 1500 mg via INTRAVENOUS
  Filled 2013-01-03 (×4): qty 1500

## 2013-01-03 MED ORDER — QUETIAPINE FUMARATE 25 MG PO TABS
25.0000 mg | ORAL_TABLET | Freq: Every day | ORAL | Status: DC
  Administered 2013-01-04 – 2013-01-07 (×5): 25 mg via ORAL
  Filled 2013-01-03 (×6): qty 1

## 2013-01-03 MED ORDER — ONDANSETRON HCL 4 MG/2ML IJ SOLN: 4.0000 mg | Freq: Four times a day (QID) | INTRAMUSCULAR | Status: DC | PRN

## 2013-01-03 MED ORDER — ACETAMINOPHEN 650 MG RE SUPP: 650.0000 mg | Freq: Four times a day (QID) | RECTAL | Status: DC | PRN

## 2013-01-03 MED ORDER — IPRATROPIUM BROMIDE 0.02 % IN SOLN: 500.0000 ug | Freq: Four times a day (QID) | RESPIRATORY_TRACT | Status: DC | PRN

## 2013-01-03 MED ORDER — TRAMADOL HCL 50 MG PO TABS
50.0000 mg | ORAL_TABLET | Freq: Two times a day (BID) | ORAL | Status: DC | PRN
  Administered 2013-01-03 – 2013-01-05 (×2): 50 mg via ORAL
  Filled 2013-01-03 (×2): qty 1

## 2013-01-03 MED ORDER — VANCOMYCIN HCL 10 G IV SOLR
2000.0000 mg | Freq: Once | INTRAVENOUS | Status: AC
  Administered 2013-01-03: 2000 mg via INTRAVENOUS
  Filled 2013-01-03: qty 2000

## 2013-01-03 MED ORDER — ASPIRIN EC 81 MG PO TBEC
81.0000 mg | DELAYED_RELEASE_TABLET | Freq: Every morning | ORAL | Status: DC
  Administered 2013-01-04 – 2013-01-08 (×5): 81 mg via ORAL
  Filled 2013-01-03 (×5): qty 1

## 2013-01-03 MED ORDER — ENSURE PUDDING PO PUDG
1.0000 | Freq: Three times a day (TID) | ORAL | Status: DC
  Administered 2013-01-04 – 2013-01-08 (×10): 1 via ORAL

## 2013-01-03 MED ORDER — ONDANSETRON HCL 4 MG PO TABS: 4.0000 mg | ORAL_TABLET | Freq: Four times a day (QID) | ORAL | Status: DC | PRN

## 2013-01-03 MED ORDER — MIRTAZAPINE 30 MG PO TABS
30.0000 mg | ORAL_TABLET | Freq: Every day | ORAL | Status: DC
  Administered 2013-01-04 – 2013-01-07 (×5): 30 mg via ORAL
  Filled 2013-01-03 (×7): qty 1

## 2013-01-03 MED ORDER — SIMVASTATIN 40 MG PO TABS
40.0000 mg | ORAL_TABLET | Freq: Every day | ORAL | Status: DC
  Administered 2013-01-04 – 2013-01-07 (×5): 40 mg via ORAL
  Filled 2013-01-03 (×6): qty 1

## 2013-01-03 MED ORDER — CARBIDOPA-LEVODOPA 10-100 MG PO TABS
1.0000 | ORAL_TABLET | Freq: Every evening | ORAL | Status: DC
  Administered 2013-01-04 – 2013-01-07 (×5): 1 via ORAL
  Filled 2013-01-03 (×7): qty 1

## 2013-01-03 MED ORDER — ALBUTEROL SULFATE (5 MG/ML) 0.5% IN NEBU: 2.5000 mg | INHALATION_SOLUTION | Freq: Four times a day (QID) | RESPIRATORY_TRACT | Status: DC | PRN

## 2013-01-03 MED ORDER — PIPERACILLIN-TAZOBACTAM 3.375 G IVPB 30 MIN
3.3750 g | Freq: Four times a day (QID) | INTRAVENOUS | Status: DC
  Filled 2013-01-03 (×3): qty 50

## 2013-01-03 MED ORDER — CLONAZEPAM 0.5 MG PO TABS
0.5000 mg | ORAL_TABLET | Freq: Two times a day (BID) | ORAL | Status: DC
  Administered 2013-01-04 (×3): 1 mg via ORAL
  Administered 2013-01-05 – 2013-01-06 (×3): 0.5 mg via ORAL
  Administered 2013-01-06: 1 mg via ORAL
  Administered 2013-01-07 – 2013-01-08 (×3): 0.5 mg via ORAL
  Filled 2013-01-03 (×2): qty 1
  Filled 2013-01-03: qty 2
  Filled 2013-01-03 (×5): qty 1

## 2013-01-03 MED ORDER — HYDROCODONE-ACETAMINOPHEN 5-325 MG PO TABS
1.0000 | ORAL_TABLET | Freq: Four times a day (QID) | ORAL | Status: DC | PRN
  Administered 2013-01-04 – 2013-01-08 (×11): 1 via ORAL
  Filled 2013-01-03 (×12): qty 1

## 2013-01-03 MED ORDER — SERTRALINE HCL 100 MG PO TABS
100.0000 mg | ORAL_TABLET | Freq: Every evening | ORAL | Status: DC
  Administered 2013-01-04 – 2013-01-07 (×4): 100 mg via ORAL
  Filled 2013-01-03 (×6): qty 1

## 2013-01-03 MED ORDER — CLONAZEPAM 1 MG PO TABS
1.0000 mg | ORAL_TABLET | Freq: Two times a day (BID) | ORAL | Status: DC | PRN
  Filled 2013-01-03 (×3): qty 1

## 2013-01-03 MED ORDER — GABAPENTIN 300 MG PO CAPS
300.0000 mg | ORAL_CAPSULE | Freq: Three times a day (TID) | ORAL | Status: DC
  Administered 2013-01-04 – 2013-01-08 (×14): 300 mg via ORAL
  Filled 2013-01-03 (×17): qty 1

## 2013-01-03 MED ORDER — ENOXAPARIN SODIUM 30 MG/0.3ML ~~LOC~~ SOLN
30.0000 mg | SUBCUTANEOUS | Status: DC
  Administered 2013-01-03 – 2013-01-06 (×4): 30 mg via SUBCUTANEOUS
  Filled 2013-01-03 (×5): qty 0.3

## 2013-01-03 MED ORDER — PIPERACILLIN-TAZOBACTAM 3.375 G IVPB 30 MIN
3.3750 g | Freq: Once | INTRAVENOUS | Status: AC
  Administered 2013-01-03: 3.375 g via INTRAVENOUS
  Filled 2013-01-03: qty 50

## 2013-01-03 MED ORDER — LEVOTHYROXINE SODIUM 112 MCG PO TABS
112.0000 ug | ORAL_TABLET | Freq: Every day | ORAL | Status: DC
  Administered 2013-01-04 – 2013-01-08 (×5): 112 ug via ORAL
  Filled 2013-01-03 (×6): qty 1

## 2013-01-03 MED ORDER — SODIUM CHLORIDE 0.9 % IV SOLN
INTRAVENOUS | Status: DC
  Administered 2013-01-05: 13:00:00 via INTRAVENOUS

## 2013-01-03 MED ORDER — ACETAMINOPHEN 325 MG PO TABS: 650.0000 mg | ORAL_TABLET | Freq: Four times a day (QID) | ORAL | Status: DC | PRN

## 2013-01-03 NOTE — H&P (Signed)
Vital Signs  Entered weight:  235  lbs., Calculated Weight: 235 lbs., ( 106.60 kg) Height: 70 in., ( 177.80 cm) Temperature: 97.4 deg F, Temperature site: oral Pulse rate: 85 Pulse rhythm: regular  Blood Pressure #1: 118 / 68 mm Hg    BMI: 33.72 BSA: 2.24 Wt Chg: 0 lbs since 12/06/2012  Vitals entered by: Alvie Heidelberg CMA on January 03, 2013 4:46 PM        Risk Factors:  Tobacco use:  former smoker    Year quit:  08/19/12 Passive smoke exposure:  yes Drug use:  no HIV high-risk behavior:  no Caffeine use:  2 two times a week drinks per day Alcohol use:  no Exercise:  no Seatbelt use:  100 % Sun Exposure:  occasionally  Colonoscopy History:    Date of Last Colonoscopy:  11/02/2005  History of Present Illness  Reason for visit: See chief complaint Chief Complaint: fever in my foot and L leg off and on x3M its swelling c/o wounds and drainage using different thing OTC on wound. no injury to knowledge does interfere w/ walking and sleep  History of Present Illness: 30 Male here for follow up LLE Venous stasis Ulcer and seeing Wound center twice.  They have I and Ded it and have him Dressing it.  The wound is under L foot on plantar surface.  They have been discussing Hyperbaric Chamber.  They have him set up with Dr Allyson Sabal to asses Arterial Blood Supply 2/7 and follow up With Dr Allyson Sabal after.  He is here today with Redness/fever and cellulitis up to L Knee.  He also has anxiety and panic attack on Monday.  Will admit for eval and Rx and IV abx.  Will get ortho and Wound care.  He is treating pain with the Hydrocodone up to 3 imes per day.   Review of Systems  General:       Complains of fevers, anorexia.        Denies chills, headache, sweats, fatigue, malaise, sleep disturbance, weight loss.   Eyes:       Denies blurring, diplopia, irritation, discharge, vision loss, eye pain, photophobia.   Ears/Nose/Throat:       Denies earache, ear discharge, tinnitus, decreased hearing,  nasal congestion, nosebleeds, sore throat, hoarseness, dysphagia.   Cardiovascular:       Complains of dyspnea on exertion, peripheral edema.        Denies chest pains, claudication, palpitations, syncope, orthopnea, PND.   Respiratory:       Complains of dyspnea.        Denies cough, excessive sputum, hemoptysis, wheezing.   Gastrointestinal:       Denies nausea, vomiting, diarrhea, constipation, heartburn, change in bowel habits, abdominal pain, melena, hematochezia, jaundice.   Genitourinary:       Denies dysuria, hematuria, discharge, urinary frequency, urinary hesitancy, nocturia, incontinence, genital sores, erectile dysfunction, decreased libido.   Musculoskeletal:       Complains of stiffness, arthritis.   Skin:       cellulitis Neurologic:       Denies radicular symptoms, weakness, paresthesias, seizures, syncope, tremors, vertigo, dizziness, gait instability.   Psychiatric:       Complains of anxiety.        recent Panic attack. Endocrine:       Denies cold intolerance, heat intolerance, polydipsia, polyphagia, polyuria, weight change.    Past History Past Medical History (reviewed - no changes required): Migraine HA,  Positive Syphilis test (congenital)  s/p PCN,  Rheumatic fever,  PAD-RAS s/p PCI 7/06,  Peripheral neuropathy,  COPD--Smoker,  GERD complicated by peptic stricture/ esophageal diverticula, Hyperlipidemia, Hypothyroidism, HTN, Depression, HOH, CRI/CKD, RLS, ED, Rosacea, Sq Cell Ca of L Ear--Dr Emily Filbert.  Nephrolithiasis, sleep apnea, PAD,  02/2008 MMSE 29/30.  Sacral Decub 05/2011 Moderate PAD noted. ABI .6  05/2009 PFTs FVC 2.47L, FEV1 1.53L, FEV1/FVC 86%  20-40% improvement post Bonchodilators. LE Doppler 03/31/09 (-) DVT but Deep venous reflux is noted July 24, 2007 Dr Marina Goodell eval regarding dysphagia, weight loss and an abnormal CT scan (circumferential esophageal wall thickening as well as retained contrast in the esophagus, mild adenopathy and gastric wall    thickening with luminal narrowing, changes in the right lung consistent with aspiration pneumonia, gallstones, and a small umbilical hernia) -- Upper Endo = Strictures/ Diverticulum/ HH/ Esoph Dysmotiliy and S/P Dilitation. 06/2010 CXR  Emphysematous changes and bibasilar atelectasis without acute findings 7/6/6 Inferior left renal artery critical 80-90 percent ostial stenosis with significant systolic and mean pressure gradients.  Post-stenting, the gradient was reduced to zero with a 5 mm x 15 mm Palmaz Blue stent. Widely patent left superior renal artery and right single main renal artery. 05/2005 MRA showed:  1.   Two left renal arteries.  The more anterior left renal artery is patent, however, the more posterior left renal artery has a focal osteal flow gap consistent with a short segment occlusion versus critical stenosis.    2.   Patent right renal artery.  3.   Significant calcific aortoiliac disease without stenosis, dissection or occlusion.  4.   Proximal celiac mild to moderate stenosis.  However, the superior mesenteric and inferior arteries are patent. Asp PNA event 08/2012 Surgical History (reviewed - no changes required): Right eye cataract  blepharochalasis  left sides> right sides, causing obstruction of his superior visual field areas as well as laterally.  Upper lid blepharoplasties bilateral surgery 10/04/10 Dr Shon Hough Family History (reviewed - no changes required): Pt's father is deceased and had DM and was an alcoholic.  Mother is deceased and had an AAA.  Brother has DM, CAD, Dementia and PD Social History (reviewed - no changes required): Pt is married and has 3 children and one grandchild.  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  Pt smokes one ppd off and on since 15 years.     Physical Exam  General appearance: well nourished, well hydrated, no acute distress.   Eyes  External: conjunctivae and lids normal Pupils: equal, round, reactive to light and  accommodation  Ears, Nose and Throat  External ears: normal, no lesions or deformities External nose: normal, no lesions or deformities Otoscopic: canals clear, tympanic membranes intact, no fluid Hearing: diminished Nasal: mucosa, septum, and turbinates normal Dental: poor dentition Pharynx: tongue normal, protrudes mid line,  posterior pharynx without erythema or exudate  Neck  Neck: supple, no masses, trachea midline Thyroid: no nodules, masses, tenderness, or enlargement  Respiratory  Respiratory effort: no intercostal retractions or use of accessory muscles Auscultation: no rales, rhonchi, or wheezes  Cardiovascular  Palpation: no thrill or palpable murmurs, no displacement of PMI Auscultation: S1, S2, no murmur, rub, or gallop Pedal pulses: non-palpable pulses bilaterally Periph. circulation: diminished, 1+ bilateral edema, base of 5th metatarsal quarter sized ulceration-unstageable-some necrotic tissue-some beefy tissue-no active drainage, erythema/redness and Cellulitis to Knee  Gastrointestinal  Abdomen: soft, non-tender, no masses, bowel sounds normal Liver and spleen: no enlargement or nodularity  Lymphatic  Neck: no cervical adenopathy  Musculoskeletal  Gait and station: normal Digits and nails: no clubbing, cyanosis, petechiae, or nodes Spine, ribs, pelvis: normal alignment and mobility, no deformity RUE: normal ROM and strength, no joint enlargement or tenderness LUE: normal ROM and strength, no joint enlargement or tenderness RLE: normal ROM and strength, no joint enlargement or tenderness LLE: normal ROM and strength, no joint enlargement or tenderness  Skin  Inspection: see peripheral circulation-unstageable quarter sized likely arterial ulceration of left lateral foot - Palpation: no subcutaneous nodules or induration  Neurologic  Cranial nerves: II - XII grossly intact Reflexes: 2+, symmetric   Impression & Recommendations:  Problem # 1:  FOOT  ULCER, LEFT (ICD-707.10) (ICD10-L97.529)  Arterial Vasc Ulcer. failing Outpt conservative therapy and has recurrent LE Cellulitis Seeing Dr Jimmey Ralph at Wound center. X ray (-) 11/21/12 for Osteo. Wound less drainage today  Known PAD - Dr Arbie Cookey. Will admit, get Ortho eval, re-xray of foot, get wound nursing, check Aortic US as he was to get this per Dr Allyson Sabal 2/7 anyway Pain Meds  IV Abx   Problem # 2:  CELLULITIS, LEG, LEFT (ICD-682.6) (ICD10-L03.116)  IV Abx will be ordered. Elevate foot. The following medications were removed from the medication list:    Augmentin 875-125 Mg Tabs (Amoxicillin-pot clavulanate) .Marland Kitchen... Take 1 tablet by mouth twice daily   Problem # 3:  ADULT FAILURE TO THRIVE (ICD-783.7) (ICD10-R62.7)  Mentally doing fine. last hospitalization 8/30th No Longer smoking. Hospital today   Problem # 4:  CHRONIC KIDNEY DISEASE STAGE III (MODERATE) (ICD-585.3) (ICD10-N18.3)  Labs Reviewed: BUN: 27 (09/10/2012)   Cr: 1.6 (09/10/2012)    Hgb: 15.2 (09/07/2012)   Hct: 44.3 (09/07/2012)   Ca++: 8.8 (09/10/2012)    TP: 5.9 (09/10/2012)   Alb: 3.2 (09/10/2012)   Problem # 5:  COPD (ICD-496) (ICD10-J44.9)  His updated medication list for this problem includes:    Advair Diskus 250-50 Mcg/dose Aepb (Fluticasone-salmeterol) ..... One inhalation bid  S/P smoking cessation Continue Advair. 05/2009 PFTs FVC 2.47L, FEV1 1.53L, FEV1/FVC 86%  20-40% improvement post Bonchodilators. Breathing well.    Problem # 6:  HYPERTENSION (ICD-401.9) (ICD10-I10)  His updated medication list for this problem includes:    Propranolol Hcl 20 Mg Tabs (Propranolol hcl) .Marland Kitchen... 2-3 po qd prn    Furosemide 20 Mg Tabs (Furosemide) ..... One po daily prn edema  BP today: 118/68 Prior BP: 130/68 (12/06/2012)  Labs Reviewed: Creat: 1.6 (09/10/2012) Chol: 121 (07/19/2012)   HDL: 34 (03/31/2009)   LDL: 35 (07/19/2012)      Complete Medication List: 1)  Cyclobenzaprine Hcl 5 Mg Tabs  (Cyclobenzaprine hcl) .... One twice a day for muscle relaxation, caution on sedation with other meds 2)  Hydrocodone-acetaminophen 5-325 Mg Tabs (Hydrocodone-acetaminophen) .Marland Kitchen.. 1 po tid prn severe pain 3)  Desowen Oint W/cetaphil Lot 0.05 % Kit (Desonide oint-moisturizing lot) .... 2x a day 4)  Hydroxyzine Hcl 25 Mg Tabs (Hydroxyzine hcl) .... 1/2 tab tid prn itch 5)  Hydroxyzine Pamoate 25 Mg Caps (Hydroxyzine pamoate) .... 1/2 pill every 8 hrs as needed for itching, caution on sedation with other meds 6)  Silver Sulfadiazine 1 % Crea (Silver sulfadiazine) .... Apply to skin as directed daily 7)  Gabapentin 300 Mg Caps (Gabapentin) .... Take 1 tablet by mouth 3 times a day 8)  Propranolol Hcl 20 Mg Tabs (Propranolol hcl) .... 2-3 po qd prn 9)  Carbidopa-levodopa 10-100 Mg Tabs (Carbidopa-levodopa) .... Take one tablet before bedtime 10)  Clonazepam 1 Mg Tabs (Clonazepam) .... Take  1/2 tablet in the morning, 1/2 at lunch and 2  tablets in the evening 11)  Remeron 30 Mg Tabs (Mirtazapine) .... Take one tablet before bedtime 12)  Seroquel 50 Mg Tabs (Quetiapine fumarate) .Marland Kitchen.. 1 po qhs 13)  Simvastatin 40 Mg Tabs (Simvastatin) .... Take one tablet at bedtime 14)  Synthroid 112 Mcg Tabs (Levothyroxine sodium) .... Take one tablet daily 15)  Sertraline Hcl 100 Mg Tabs (Sertraline hcl) .Marland Kitchen.. 1 po qd 16)  Aspirin 81 Mg Ec Tab (Aspirin) .... Take one (1) tablet by mouth daily 17)  Melatonin 3 Mg Caps (Melatonin) .Marland Kitchen.. 1 capsule by mouth at night as needed 18)  Advair Diskus 250-50 Mcg/dose Aepb (Fluticasone-salmeterol) .... One inhalation bid 19)  Viagra 100 Mg Tabs (Sildenafil citrate) .... One po daily prn 20)  Furosemide 20 Mg Tabs (Furosemide) .... One po daily prn edema   Comments: ADMIT.    Electronically signed by Gwen Pounds MD on 01/03/2013 at 5:29 PM

## 2013-01-03 NOTE — Progress Notes (Addendum)
ANTIBIOTIC CONSULT NOTE - INITIAL  Pharmacy Consult for vancomycin  Indication: cellulitis/wound infection  No Known Allergies  Patient Measurements:  wt: 106.6 kg  Vital Signs: Temp: 98.3 F (36.8 C) (01/16 1835) BP: 136/70 mmHg (01/16 1835) Pulse Rate: 92  (01/16 1835) Intake/Output from previous day:   Intake/Output from this shift:    Labs: No results found for this basename: WBC:3,HGB:3,PLT:3,LABCREA:3,CREATININE:3 in the last 72 hours The CrCl is unknown because both a height and weight (above a minimum accepted value) are required for this calculation. No results found for this basename: VANCOTROUGH:2,VANCOPEAK:2,VANCORANDOM:2,GENTTROUGH:2,GENTPEAK:2,GENTRANDOM:2,TOBRATROUGH:2,TOBRAPEAK:2,TOBRARND:2,AMIKACINPEAK:2,AMIKACINTROU:2,AMIKACIN:2, in the last 72 hours   Microbiology: No results found for this or any previous visit (from the past 720 hour(s)).  Medical History: Past Medical History  Diagnosis Date  . Hyperlipidemia   . COPD (chronic obstructive pulmonary disease)   . Leg pain   . GERD (gastroesophageal reflux disease)   . Depression   . Dizziness   . Productive cough   . Thyroid disease   . Peripheral vascular disease   . Chronic kidney disease   . ED (erectile dysfunction)   . Hypertension     08/17/12 - wife denies    Medications:  Prescriptions prior to admission  Medication Sig Dispense Refill  . acetaminophen (TYLENOL) 325 MG tablet Take 650 mg by mouth every 6 (six) hours as needed. For pain or fever      . albuterol (PROVENTIL) (5 MG/ML) 0.5% nebulizer solution Take 2.5 mg by nebulization every 6 (six) hours as needed. For shortness of breath      . aspirin EC 81 MG tablet Take 81 mg by mouth every morning.       . carbidopa-levodopa (SINEMET) 10-100 MG per tablet Take 1 tablet by mouth every evening.       . clonazePAM (KLONOPIN) 1 MG tablet Take 0.5-1 mg by mouth 2 (two) times daily. scheduled      . feeding supplement (ENSURE) PUDG Take 1  Container by mouth 3 (three) times daily between meals.      . furosemide (LASIX) 20 MG tablet Take 20 mg by mouth daily as needed. For edema      . gabapentin (NEURONTIN) 300 MG capsule Take 300 mg by mouth 3 (three) times daily.      Marland Kitchen HYDROcodone-acetaminophen (NORCO/VICODIN) 5-325 MG per tablet Take 1 tablet by mouth every 6 (six) hours as needed. For pain      . ipratropium (ATROVENT) 0.02 % nebulizer solution Take 500 mcg by nebulization every 6 (six) hours as needed. For shortness of breath      . levothyroxine (SYNTHROID, LEVOTHROID) 112 MCG tablet Take 112 mcg by mouth every morning.       . Melatonin 3 MG CAPS Take 3 mg by mouth every evening.       . mirtazapine (REMERON) 30 MG tablet Take 30 mg by mouth at bedtime.       Marland Kitchen QUEtiapine (SEROQUEL) 25 MG tablet Take 25 mg by mouth at bedtime.       . sertraline (ZOLOFT) 100 MG tablet Take 100 mg by mouth every evening.       . simvastatin (ZOCOR) 40 MG tablet Take 40 mg by mouth at bedtime.       . [DISCONTINUED] clonazePAM (KLONOPIN) 1 MG tablet Take 1 tablet (1 mg total) by mouth 2 (two) times daily as needed for anxiety (anxiety).  30 tablet     Assessment: 79 yo M  w LLE venous statis ulcer/wound  under L foot on plantar surface, s/p I&D, now with redness, cellulitis up to L knee.    Admitted for IV abx and wound care.  Goal of Therapy:  Vancomycin trough level ~15 mcg/ml  Plan:  - Vancomycin 2000 mg IV x1, then 1500 mg IV q24h (assuming CrCl 30-60 ml/min) - Follow up SCr, UOP, cultures, clinical course and adjust as clinically indicated. - Will adjust zosyn to 3.375G IV x1 over 30 min then, q8h over 4h, per policy  Gwenevere Goga L. Illene Bolus, PharmD, BCPS Clinical Pharmacist Pager: [phone] Pharmacy: [phone] 01/03/2013 7:09 PM

## 2013-01-04 ENCOUNTER — Inpatient Hospital Stay (HOSPITAL_COMMUNITY): Payer: Medicare Other

## 2013-01-04 ENCOUNTER — Encounter (HOSPITAL_BASED_OUTPATIENT_CLINIC_OR_DEPARTMENT_OTHER): Payer: Medicare Other

## 2013-01-04 DIAGNOSIS — L97509 Non-pressure chronic ulcer of other part of unspecified foot with unspecified severity: Secondary | ICD-10-CM

## 2013-01-04 DIAGNOSIS — L0390 Cellulitis, unspecified: Secondary | ICD-10-CM

## 2013-01-04 DIAGNOSIS — I739 Peripheral vascular disease, unspecified: Secondary | ICD-10-CM

## 2013-01-04 MED ORDER — MORPHINE SULFATE 2 MG/ML IJ SOLN
1.0000 mg | INTRAMUSCULAR | Status: DC | PRN
  Administered 2013-01-04 – 2013-01-08 (×13): 1 mg via INTRAVENOUS
  Filled 2013-01-04 (×12): qty 1

## 2013-01-04 MED ORDER — POTASSIUM CHLORIDE CRYS ER 20 MEQ PO TBCR
20.0000 meq | EXTENDED_RELEASE_TABLET | Freq: Once | ORAL | Status: AC
  Administered 2013-01-04: 20 meq via ORAL
  Filled 2013-01-04: qty 1

## 2013-01-04 MED ORDER — PANTOPRAZOLE SODIUM 40 MG PO TBEC
40.0000 mg | DELAYED_RELEASE_TABLET | Freq: Every day | ORAL | Status: DC
  Administered 2013-01-04 – 2013-01-08 (×5): 40 mg via ORAL
  Filled 2013-01-04 (×5): qty 1

## 2013-01-04 NOTE — Consult Note (Signed)
Reason for Consult:left foot ulcer Referring Physician: Dr. Lorenda Cahill [name] is an 79 y.o. male.  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 He states that the ulcer has been there for a long time and has been draining for several months. He is currently admitted to the medical service for treatment of cellulitis of the foot and lower leg on the left. Patient does have a known history of peripheral artery disease  Past Medical History  Diagnosis Date  . Hyperlipidemia   . COPD (chronic obstructive pulmonary disease)   . Leg pain   . GERD (gastroesophageal reflux disease)   . Depression   . Dizziness   . Productive cough   . Thyroid disease   . Peripheral vascular disease   . Chronic kidney disease   . ED (erectile dysfunction)   . Hypertension     08/17/12 - wife denies    Past Surgical History  Procedure Date  . Renal artery stent     left  . Kidney stone surgery 1989    Family History  Problem Relation Age of Onset  . Other Mother     AAA  . AAA (abdominal aortic aneurysm) Mother   . Heart attack Father   . Alcohol abuse Father   . Diabetes Father   . Other Father     amputation  . Diabetes Brother   . Coronary artery disease Brother   . Dementia Brother     Social History:  reports that he quit smoking about 4 months ago. His smoking use included Cigarettes. He has a 65 pack-year smoking history. He has never used smokeless tobacco. He reports that he does not drink alcohol or use illicit drugs.  Allergies: No Known Allergies  Medications: I have reviewed the patient's current medications.  Results for orders placed during the hospital encounter of 01/03/13 (from the past 48 hour(s))  COMPREHENSIVE METABOLIC PANEL     Status: Abnormal   Collection Time   01/03/13  7:28 PM      Component Value Range Comment   Sodium 135  135 - 145 mEq/L    Potassium 3.2 (*) 3.5 - 5.1 mEq/L    Chloride 98   96 - 112 mEq/L    CO2 24  19 - 32 mEq/L    Glucose, Bld 144 (*) 70 - 99 mg/dL    BUN 33 (*) 6 - 23 mg/dL    Creatinine, Ser 1.61 (*) 0.50 - 1.35 mg/dL    Calcium 9.1  8.4 - 09.6 mg/dL    Total Protein 6.9  6.0 - 8.3 g/dL    Albumin 3.0 (*) 3.5 - 5.2 g/dL    AST 29  0 - 37 U/L    ALT 18  0 - 53 U/L    Alkaline Phosphatase 68  39 - 117 U/L    Total Bilirubin 0.9  0.3 - 1.2 mg/dL    GFR calc non Af Amer 32 (*) >90 mL/min    GFR calc Af Amer 37 (*) >90 mL/min   CBC     Status: Abnormal   Collection Time   01/03/13  7:28 PM      Component Value Range Comment   WBC 12.8 (*) 4.0 - 10.5 K/uL    RBC 4.24  4.22 - 5.81 MIL/uL    Hemoglobin 12.0 (*) 13.0 - 17.0 g/dL    HCT 04.5 (*) 40.9 - 52.0 %  MCV 84.4  78.0 - 100.0 fL    MCH 28.3  26.0 - 34.0 pg    MCHC 33.5  30.0 - 36.0 g/dL    RDW 96.0  45.4 - 09.8 %    Platelets 146 (*) 150 - 400 K/uL   APTT     Status: Abnormal   Collection Time   01/03/13  7:28 PM      Component Value Range Comment   aPTT 39 (*) 24 - 37 seconds   PROTIME-INR     Status: Normal   Collection Time   01/03/13  7:28 PM      Component Value Range Comment   Prothrombin Time 13.8  11.6 - 15.2 seconds    INR 1.07  0.00 - 1.49   SEDIMENTATION RATE     Status: Abnormal   Collection Time   01/03/13  7:28 PM      Component Value Range Comment   Sed Rate 97 (*) 0 - 16 mm/hr   C-REACTIVE PROTEIN     Status: Abnormal   Collection Time   01/03/13  7:28 PM      Component Value Range Comment   CRP 25.9 (*) <0.60 mg/dL     Dg Foot Complete Left  01/04/2013  *RADIOLOGY REPORT*  Clinical Data: Ulcer  LEFT FOOT - COMPLETE 3+ VIEW  Comparison: None.  Findings: Soft tissue defect lateral to the fifth MTP joint.  No definite cortical destruction to suggest osteomyelitis.  Mild diffuse osteopenia.  Normal alignment.  Negative for fracture or other acute bony abnormality.  No significant osseous degenerative change.  Small calcaneal spur.  Patchy vascular calcifications.   IMPRESSION:  No radiographic evidence of osteomyelitis.  MRI is a more sensitive study.   Original Report Authenticated By: D. Andria Rhein, MD     Review of Systems  Constitutional: Negative.   HENT: Negative.   Eyes: Negative.   Respiratory: Negative.   Cardiovascular: Negative.   Gastrointestinal: Negative.   Musculoskeletal: Negative.   Skin: Negative.   Neurological: Negative.   Psychiatric/Behavioral: Negative.    Blood pressure 138/75, pulse 66, temperature 98.8 F (37.1 C), resp. rate 20, SpO2 99.00%. Physical Exam  Constitutional: He appears well-developed.  HENT:  Head: Normocephalic.  Eyes: Pupils are equal, round, and reactive to light.  Neck: Normal range of motion.  Cardiovascular: Normal rate.   Respiratory: Effort normal.  Neurological: He is alert.  Skin: Skin is warm.   left lower [name] he demonstrates edema and erythema from just below the tibial tubercle distally. There is no tissue crepitus. Pedal pulses diminished but palpable. Medical-sized ulcer on the lateral aspect of the fifth metatarsal head. The fluctuance or drainage from this area currently although the patient describes having drainage from this for several months. He has good dorsiflexion plantarflexion strength mildly diminished sensation dorsum plantar aspect of the foot potentially due to swelling right lower [name] he also has swelling there is no knee effusion on the left  Assessment/Plan: Impression is left lower [name] he cellulitis currently being treated with antibiotics with ulcer on the lateral aspect of the fifth metatarsal head. Radiographs negative for osteomyelitis. Because of a history of drainage she could have a call osteomyelitis which male and the visible with MRI scanning. For that reason a like procedure MRI scanning today or tomorrow and progress based on those results in the meantime we'll use dry dressing to the left foot.  DEAN,GREGORY SCOTT 01/04/2013, 11:58 AM

## 2013-01-04 NOTE — Evaluation (Signed)
Physical Therapy Evaluation Patient Details Name: [name] MRN: [id number] DOB: [birth year] Today's Date: 01/04/2013 Time: 7829-5621 PT Time Calculation (min): 28 min  PT Assessment / Plan / Recommendation Clinical Impression  pt presents with L LE Cellulitis and L foot ulcer.  pt attempts to ambulate to/from bathroom without post-op shoe.  pt indicates pain in L foot with mobility, RN made aware.  MD: Please advise any WBing restrictions for L LE and activity level desired.      PT Assessment  Patient needs continued PT services    Follow Up Recommendations  Home health PT;Supervision/Assistance - 24 hour    Does the patient have the potential to tolerate intense rehabilitation      Barriers to Discharge None      Equipment Recommendations  None recommended by PT    Recommendations for Other Services     Frequency Min 3X/week    Precautions / Restrictions Precautions Precautions: Fall Required Braces or Orthoses: Other Brace/Splint Other Brace/Splint: Post-op boot on when OOB.   Restrictions Weight Bearing Restrictions: No   Pertinent Vitals/Pain Does not rate, but indicates pain in L foot after activity.  RN made aware.        Mobility  Bed Mobility Bed Mobility: Supine to Sit;Sitting - Scoot to Edge of Bed;Sit to Supine Supine to Sit: 5: Supervision;With rails;HOB elevated Sitting - Scoot to Edge of Bed: 5: Supervision Sit to Supine: 5: Supervision;HOB elevated Details for Bed Mobility Assistance: pt able to complete without A when using rails and having HOB elevated.   Transfers Transfers: Sit to Stand;Stand to Sit Sit to Stand: 5: Supervision;With upper extremity assist;From bed Stand to Sit: 5: Supervision;With upper extremity assist;To bed Ambulation/Gait Ambulation/Gait Assistance: 4: Min guard Ambulation Distance (Feet): 20 Feet (x2) Assistive device: Straight cane (and IV pole in other hand.  ) Ambulation/Gait Assistance Details: pt generally  unsteady and attempts to set cane down and walk away from it.  pt indicates pain in L foot with mobility.   Gait Pattern: Step-through pattern;Decreased stride length;Antalgic Stairs: No Wheelchair Mobility Wheelchair Mobility: No    Shoulder Instructions     Exercises     PT Diagnosis: Difficulty walking;Acute pain  PT Problem List: Decreased strength;Decreased activity tolerance;Decreased balance;Decreased mobility;Decreased knowledge of use of DME;Pain PT Treatment Interventions: DME instruction;Gait training;Stair training;Functional mobility training;Therapeutic activities;Therapeutic exercise;Balance training;Patient/family education   PT Goals Acute Rehab PT Goals PT Goal Formulation: With patient Time For Goal Achievement: 01/11/13 Potential to Achieve Goals: Good Pt will go Supine/Side to Sit: with modified independence PT Goal: Supine/Side to Sit - Progress: Goal set today Pt will go Sit to Supine/Side: with modified independence PT Goal: Sit to Supine/Side - Progress: Goal set today Pt will go Sit to Stand: with modified independence PT Goal: Sit to Stand - Progress: Goal set today Pt will go Stand to Sit: with modified independence PT Goal: Stand to Sit - Progress: Goal set today Pt will Ambulate: >150 feet;with modified independence;with rolling walker PT Goal: Ambulate - Progress: Goal set today Pt will Go Up / Down Stairs: 3-5 stairs;with min assist;with rail(s) PT Goal: Up/Down Stairs - Progress: Goal set today  Visit Information  Last PT Received On: 01/04/13 Assistance Needed: +1    Subjective Data  Subjective: I needed to go to the bathroom.- pt found attempting to ambulate on his own to bathroom with IV pole still plugged into outlet.   Patient Stated Goal: Foot to heal.     Prior Functioning  Home Living Lives With: Spouse Available Help at Discharge: Family;Available 24 hours/day Type of Home: House Home Access: Stairs to enter [employer] of Steps: 4 Entrance Stairs-Rails: Right;Left;Can reach both Home Layout: One level Home Adaptive Equipment: Straight cane;Walker - rolling Prior Function Level of Independence: Needs assistance Needs Assistance: Meal Prep;Light Housekeeping Meal Prep: Moderate Light Housekeeping: Total Able to Take Stairs?: Yes Driving: No Vocation: Retired Musician: Clinical cytogeneticist  Overall Cognitive Status: Difficult to assess Difficult to assess due to: Hard of hearing/deaf Arousal/Alertness: Awake/alert Orientation Level: Appears intact for tasks assessed Behavior During Session: [facility] for tasks performed Cognition - Other Comments: Difficult to assess cognition as pt is HOH and at times unsure if pt is hearing everything or if he has memory difficulties.      Extremity/Trunk Assessment Right Lower Extremity Assessment RLE ROM/Strength/Tone: WFL for tasks assessed RLE Sensation: WFL - Light Touch Left Lower Extremity Assessment LLE ROM/Strength/Tone: Deficits LLE ROM/Strength/Tone Deficits: Limited by pain in foot and lower leg.   LLE Sensation: Deficits LLE Sensation Deficits: Diminished in toes and plantar aspect of foot.   Trunk Assessment Trunk Assessment: Normal   Balance Balance Balance Assessed: Yes Static Standing Balance Static Standing - Balance Support: During functional activity;No upper extremity supported Static Standing - Level of Assistance: 5: Stand by assistance Static Standing - Comment/# of Minutes: pt able to don/doff underwear while standing at toilet.    End of Session PT - End of Session Equipment Utilized During Treatment: Gait belt Activity Tolerance: Patient limited by pain Patient left: in bed;with call bell/phone within reach;with family/visitor present Nurse Communication: Mobility status  GP     Sunny Schlein, [place] [phone] 01/04/2013, 2:23 PM

## 2013-01-04 NOTE — Progress Notes (Signed)
Utilization review completed. Latosha Gaylord, RN, BSN. 

## 2013-01-04 NOTE — Progress Notes (Signed)
Subjective: Admitted for L Foot Wound and LLE Cellulitis place on Zosyn/Vanco. Cr 1.88 k low CRP and Sed rate high X ray foot:  No radiographic evidence of osteomyelitis.  MRI is a more sensitive study.  Some pain. More Dyspepsia.   Objective: Vital signs in last 24 hours: Temp:  [98.1 F (36.7 C)-98.8 F (37.1 C)] 98.8 F (37.1 C) (01/17 0649) Pulse Rate:  [65-92] 66  (01/17 0649) Resp:  [18-20] 20  (01/17 0649) BP: (136-155)/(51-75) 138/75 mmHg (01/17 0649) SpO2:  [96 %-99 %] 99 % (01/17 0649) Weight change:  Last BM Date: 01/03/13  CBG (last 3)  No results found for this basename: GLUCAP:3 in the last 72 hours  Intake/Output from previous day:  Intake/Output Summary (Last 24 hours) at 01/04/13 0943 Last data filed at 01/04/13 0606  Gross per 24 hour  Intake    630 ml  Output      0 ml  Net    630 ml   01/16 0701 - 01/17 0700 In: 630 [IV Piggyback:630] Out: -    Physical Exam  General appearance: Alert and Oriented. Eyes: no scleral icterus Throat: oropharynx moist without erythema Resp: CTA Cardio: Reg GI: soft, non-tender; bowel sounds normal; no masses,  no organomegaly.  Overweight Extremities:LLE Redness, swelling, cellulitis, diminished pulses.  Quarter sized ulcer.  No Foul odor or much drainage.   Lab Results:  [facility] 01/03/13 1928  NA 135  K 3.2*  CL 98  CO2 24  GLUCOSE 144*  BUN 33*  CREATININE 1.88*  CALCIUM 9.1  MG --  PHOS --     Basename 01/03/13 1928  AST 29  ALT 18  ALKPHOS 68  BILITOT 0.9  PROT 6.9  ALBUMIN 3.0*     Basename 01/03/13 1928  WBC 12.8*  NEUTROABS --  HGB 12.0*  HCT 35.8*  MCV 84.4  PLT 146*    Lab Results  Component Value Date   INR 1.07 01/03/2013    No results found for this basename: CKTOTAL:3,CKMB:3,CKMBINDEX:3,TROPONINI:3 in the last 72 hours  No results found for this basename: TSH,T4TOTAL,FREET3,T3FREE,THYROIDAB in the last 72 hours  No results found for this basename:  VITAMINB12:2,FOLATE:2,FERRITIN:2,TIBC:2,IRON:2,RETICCTPCT:2 in the last 72 hours  Micro Results: No results found for this or any previous visit (from the past 240 hour(s)).   Studies/Results: Dg Foot Complete Left  01/04/2013  *RADIOLOGY REPORT*  Clinical Data: Ulcer  LEFT FOOT - COMPLETE 3+ VIEW  Comparison: None.  Findings: Soft tissue defect lateral to the fifth MTP joint.  No definite cortical destruction to suggest osteomyelitis.  Mild diffuse osteopenia.  Normal alignment.  Negative for fracture or other acute bony abnormality.  No significant osseous degenerative change.  Small calcaneal spur.  Patchy vascular calcifications.  IMPRESSION:  No radiographic evidence of osteomyelitis.  MRI is a more sensitive study.   Original Report Authenticated By: D. Andria Rhein, MD      Medications: Scheduled:    . aspirin EC  81 mg Oral q morning - 10a  . carbidopa-levodopa  1 tablet Oral QPM  . clonazePAM  0.5-1 mg Oral BID  . enoxaparin (LOVENOX) injection  30 mg Subcutaneous Q24H  . feeding supplement  1 Container Oral TID BM  . gabapentin  300 mg Oral TID  . levothyroxine  112 mcg Oral QAC breakfast  . mirtazapine  30 mg Oral QHS  . piperacillin-tazobactam (ZOSYN)  IV  3.375 g Intravenous Q8H  . potassium chloride  20 mEq Oral Once  .  QUEtiapine  25 mg Oral QHS  . sertraline  100 mg Oral QPM  . simvastatin  40 mg Oral QHS  . vancomycin  1,500 mg Intravenous Q24H   Continuous:    . sodium chloride       Assessment/Plan: Active Problems:  ANXIETY DEPRESSION  HYPERTENSION  COPD  Chronic kidney disease, stage III (moderate)  Foot ulcer  Cellulitis  PAD (peripheral artery disease)   Non Healing L Foot Wound/Ulcer and LLE Cellulitis in pt with known PAD- Zosyn/Vanco.  Elevate leg, Wound care consult. Consult Ortho.  X ray (-) for Osteo, however SED Rate and CRP are high.  He failed outpt conservative management and Wound care.  He was being evaluated for hyperbaric Chamber per  Dr Jimmey Ralph. If continues to be non healing may need Dr Early consult for revasc procedure. Repeat LE Art Dopplers. One benefit of admission is that he will keep pressure off the ulcer.  He has been walking on it and has not picked up the boot that I ordered for him.  Moderate LE peripheral vascular occlusive disease. He has never required surgical treatment. ABIs in the  0.6 - 0.7 range followed by Dr Arbie Cookey.  PAD-RAS s/p PCI 7/06 He is scheduled to see Dr Allyson Sabal and have   Peripheral neuropathy - Gabapentin 300 Mg Caps (Gabapentin) .... Take 1 tablet by mouth 3 times a day  Hypokalemia - Replace K  COPD--Recently Quit Smoker. Continue inhalers.  Nebs if needed  GERD/ H/O peptic stricture/ esophageal diverticula - PPI  HTN - BP OK.    CRI/CKD 3 with Cr baseline 1.6-1.7.  1.8 and needs following  sleep apnea and RLS- On Sinemet  Anxiety/Depression/Panic D/O - on mult meds.  Asp PNA event 08/2012  ID -  Anti-infectives     Start     Dose/Rate Route Frequency Ordered Stop   01/04/13 2000   vancomycin (VANCOCIN) 1,500 mg in sodium chloride 0.9 % 500 mL IVPB        1,500 mg 250 mL/hr over 120 Minutes Intravenous Every 24 hours 01/03/13 1914     01/04/13 0600  piperacillin-tazobactam (ZOSYN) IVPB 3.375 g       3.375 g 12.5 mL/hr over 240 Minutes Intravenous 3 times per day 01/03/13 1917     01/03/13 2000   piperacillin-tazobactam (ZOSYN) IVPB 3.375 g  Status:  Discontinued        3.375 g 100 mL/hr over 30 Minutes Intravenous 4 times per day 01/03/13 1837 01/03/13 1916   01/03/13 2000   vancomycin (VANCOCIN) 2,000 mg in sodium chloride 0.9 % 500 mL IVPB        2,000 mg 250 mL/hr over 120 Minutes Intravenous  Once 01/03/13 1912 01/04/13 0117   01/03/13 2000  piperacillin-tazobactam (ZOSYN) IVPB 3.375 g       3.375 g 100 mL/hr over 30 Minutes Intravenous  Once 01/03/13 1917 01/03/13 2227         DVT Prophylaxis - Lovenox    LOS: 1 day   [name] M 01/04/2013, 9:43  AM

## 2013-01-04 NOTE — Consult Note (Signed)
WOC consult Note Reason for Consult: evaluate wound left lateral foot.  Tx per wound care center per Dr. Jimmey Ralph. Reports using "salve on it" at home. Redness of the entire LE from the knee down including the foot, 2+ edema.  He reports that the redness has been present > 3wks. He is unclear on the tx regimen at the wound care center.  Known to have PAD, workup per vascular pending. Xray neg. For osteomyelitis. Doppler pulse. ABI 0.6-0.7 range. Review of Dr. Bosie Helper note, pt can not tolerate compression wraps for swelling.  Wound type: arterial ulceration left lateral foot Measurement: 1.0cm x1.5cm x 0.5cm  Wound bed: dry, with darkening of the tissue from 1-5 o'clock, does not probe any deeper. Drainage (amount, consistency, odor) none, no odor Periwound:intact, LLE associated cellulitis Dressing procedure/placement/frequency:hydrogel daily, apply cover with dry dressing wrap with kerlix.  Discussed with bedside nurse, she will apply when it arrives to the floor.   Re consult if needed, will not follow at this time. Thanks  Najir Roop [employer], CWOCN [phone])

## 2013-01-04 NOTE — Progress Notes (Signed)
Orthopedic Tech Progress Note Patient Details:  [name] 1929/02/15 [id number]  Ortho Devices Type of Ortho Device: Postop shoe/boot Ortho Device/Splint Location: LEFT POST OP SHOE Ortho Device/Splint Interventions: Application   Cammer, Mickie Bail 01/04/2013, 12:49 PM

## 2013-01-05 DIAGNOSIS — I7090 Unspecified atherosclerosis: Secondary | ICD-10-CM

## 2013-01-05 DIAGNOSIS — L97509 Non-pressure chronic ulcer of other part of unspecified foot with unspecified severity: Secondary | ICD-10-CM

## 2013-01-05 LAB — BASIC METABOLIC PANEL
Calcium: 8.7 mg/dL (ref 8.4–10.5)
GFR calc Af Amer: 41 mL/min — ABNORMAL LOW (ref >90)
GFR calc non Af Amer: 35 mL/min — ABNORMAL LOW (ref >90)
Potassium: 3.6 mEq/L (ref 3.5–5.1)
Sodium: 139 mEq/L (ref 135–145)

## 2013-01-05 LAB — CBC
MCH: 28 pg (ref 26.0–34.0)
Platelets: 137 10*3/uL — ABNORMAL LOW (ref 150–400)
RBC: 3.96 MIL/uL — ABNORMAL LOW (ref 4.22–5.81)
WBC: 8.3 10*3/uL (ref 4.0–10.5)

## 2013-01-05 MED ORDER — FUROSEMIDE 20 MG PO TABS
20.0000 mg | ORAL_TABLET | Freq: Every day | ORAL | Status: DC
  Administered 2013-01-05 – 2013-01-06 (×2): 20 mg via ORAL
  Filled 2013-01-05 (×3): qty 1

## 2013-01-05 NOTE — Progress Notes (Signed)
Subjective: No complaints this AM. No change in wound. Good UOP. Good appetite. Pain is controlled   Objective: Vital signs in last 24 hours: Temp:  [97.9 F (36.6 C)-98.4 F (36.9 C)] 97.9 F (36.6 C) (01/18 0512) Pulse Rate:  [77-87] 87  (01/18 0512) Resp:  [18] 18  (01/18 0512) BP: (118-138)/(62-73) 135/73 mmHg (01/18 0512) SpO2:  [94 %-98 %] 95 % (01/18 0512) Weight:  [235 lb 0.2 oz (106.6 kg)] 235 lb 0.2 oz (106.6 kg) (01/17 1500) Weight change:  Last BM Date: 01/03/13  CBG (last 3)  No results found for this basename: GLUCAP:3 in the last 72 hours  Intake/Output from previous day:  Intake/Output Summary (Last 24 hours) at 01/05/13 0849 Last data filed at 01/05/13 0000  Gross per 24 hour  Intake    840 ml  Output      0 ml  Net    840 ml   01/17 0701 - 01/18 0700 In: 840 [P.O.:240; IV Piggyback:600] Out: -    Physical Exam  General appearance: Alert and Oriented. Eyes: no scleral icterus Throat: oropharynx moist without erythema Resp: CTA Cardio:2/6 SEM, No MRG  GI: soft, non-tender; bowel sounds normal; no masses,  no organomegaly.  Overweight Extremities:LLE Redness midway up shin, 2+ pitting edema,  diminished pulses.  Quarter sized ulcer.  No Foul odor or much drainage.   Lab Results:  [facility] 01/03/13 1928  NA 135  K 3.2*  CL 98  CO2 24  GLUCOSE 144*  BUN 33*  CREATININE 1.88*  CALCIUM 9.1  MG --  PHOS --     Basename 01/03/13 1928  AST 29  ALT 18  ALKPHOS 68  BILITOT 0.9  PROT 6.9  ALBUMIN 3.0*     Basename 01/03/13 1928  WBC 12.8*  NEUTROABS --  HGB 12.0*  HCT 35.8*  MCV 84.4  PLT 146*    Lab Results  Component Value Date   INR 1.07 01/03/2013    No results found for this basename: CKTOTAL:3,CKMB:3,CKMBINDEX:3,TROPONINI:3 in the last 72 hours  No results found for this basename: TSH,T4TOTAL,FREET3,T3FREE,THYROIDAB in the last 72 hours  No results found for this basename:  VITAMINB12:2,FOLATE:2,FERRITIN:2,TIBC:2,IRON:2,RETICCTPCT:2 in the last 72 hours  Micro Results: No results found for this or any previous visit (from the past 240 hour(s)).   Studies/Results: Mr Foot Left Wo Contrast  01/05/2013  *RADIOLOGY REPORT*  Clinical Data: Chronic and draining wound at the base of the fifth toe. Cellulitis of the lower leg.  MRI OF THE LEFT FOREFOOT WITHOUT CONTRAST  Technique:  Multiplanar, multisequence MR imaging was performed. No intravenous contrast was administered.  Comparison: Radiographs dated 01/03/2013  Findings: There is edema in the head of the fifth metatarsal and in the base of the proximal phalangeal bone of the fifth toe.  There is a small effusion in the fifth metatarsal phalangeal joint.  Soft tissue ulceration is immediately lateral to the joint.  However, there is a bone destruction, periosteal reaction, or abscess.  The other bones of the forefoot appear normal.  There is a prominent subcutaneous edema on the dorsum of the foot.  IMPRESSION: Soft tissue ulceration lateral to the fifth metatarsal phalangeal joint.  The edema in the adjacent bones is not definitive for osteomyelitis and could be due to hyperemia.   Original Report Authenticated By: Francene Boyers, M.D.    Dg Foot Complete Left  01/04/2013  *RADIOLOGY REPORT*  Clinical Data: Ulcer  LEFT FOOT - COMPLETE 3+ VIEW  Comparison: None.  Findings: Soft tissue defect lateral to the fifth MTP joint.  No definite cortical destruction to suggest osteomyelitis.  Mild diffuse osteopenia.  Normal alignment.  Negative for fracture or other acute bony abnormality.  No significant osseous degenerative change.  Small calcaneal spur.  Patchy vascular calcifications.  IMPRESSION:  No radiographic evidence of osteomyelitis.  MRI is a more sensitive study.   Original Report Authenticated By: D. Andria Rhein, MD      Medications: Scheduled:    . aspirin EC  81 mg Oral q morning - 10a  . carbidopa-levodopa  1  tablet Oral QPM  . clonazePAM  0.5-1 mg Oral BID  . enoxaparin (LOVENOX) injection  30 mg Subcutaneous Q24H  . feeding supplement  1 Container Oral TID BM  . gabapentin  300 mg Oral TID  . levothyroxine  112 mcg Oral QAC breakfast  . mirtazapine  30 mg Oral QHS  . pantoprazole  40 mg Oral Q0600  . piperacillin-tazobactam (ZOSYN)  IV  3.375 g Intravenous Q8H  . QUEtiapine  25 mg Oral QHS  . sertraline  100 mg Oral QPM  . simvastatin  40 mg Oral QHS  . vancomycin  1,500 mg Intravenous Q24H   Continuous:    . sodium chloride       Assessment/Plan: Active Problems:  ANXIETY DEPRESSION  HYPERTENSION  COPD  Chronic kidney disease, stage III (moderate)  Foot ulcer  Cellulitis  PAD (peripheral artery disease)   Non Healing L Foot Wound/Ulcer and LLE Cellulitis in pt with known PAD-  - Zosyn/Vanco per pharmacy consult.  Leukocytosis has resolved.   - Elevate leg  - appreciate Wound care recs.  hydrogel daily, apply cover with dry dressing wrap with kerlix.   - appreciate Ortho following.  X ray and MRI  (-) for Osteo. Pt currently declining debridement and possible amputation.  Will cont to follow - He failed outpt conservative management and Wound care.  He was being evaluated for hyperbaric Chamber per Dr Jimmey Ralph.  - Pt has wound boot for ambulation. PT is working w/ patient  Moderate LE peripheral vascular occlusive disease. - He has never required surgical treatment. ABIs in the  0.6 - 0.7 range followed by Dr Arbie Cookey.  If continues to be non healing may need Dr Early consult for revasc procedure.  PAD-RAS s/p PCI 7/06  Peripheral neuropathy - Gabapentin 300 Mg Caps (Gabapentin) .... Take 1 tablet by mouth 3 times a day  Hypokalemia - Replaced K on admission. BMP pending this AM. Will treat accordingly   COPD--Recently Quit Smoker. Continue inhalers.  Nebs if needed  GERD/ H/O peptic stricture/ esophageal diverticula - PPI  HTN - BP OK.    CRI/CKD 3 with Cr baseline  1.6-1.7.  1.8 and needs following. BMP pending this AM. Placed strict I/Os as well, given poor recording of I/O's so far this admission. Daily weights ordered as well.   sleep apnea and RLS- On Sinemet  Anxiety/Depression/Panic D/O - on mult meds.  Asp PNA event 08/2012  ID -  Anti-infectives     Start     Dose/Rate Route Frequency Ordered Stop   01/04/13 2000   vancomycin (VANCOCIN) 1,500 mg in sodium chloride 0.9 % 500 mL IVPB        1,500 mg 250 mL/hr over 120 Minutes Intravenous Every 24 hours 01/03/13 1914     01/04/13 0600  piperacillin-tazobactam (ZOSYN) IVPB 3.375 g       3.375 g 12.5 mL/hr over 240 Minutes  Intravenous 3 times per day 01/03/13 1917     01/03/13 2000   piperacillin-tazobactam (ZOSYN) IVPB 3.375 g  Status:  Discontinued        3.375 g 100 mL/hr over 30 Minutes Intravenous 4 times per day 01/03/13 1837 01/03/13 1916   01/03/13 2000   vancomycin (VANCOCIN) 2,000 mg in sodium chloride 0.9 % 500 mL IVPB        2,000 mg 250 mL/hr over 120 Minutes Intravenous  Once 01/03/13 1912 01/04/13 0117   01/03/13 2000  piperacillin-tazobactam (ZOSYN) IVPB 3.375 g       3.375 g 100 mL/hr over 30 Minutes Intravenous  Once 01/03/13 1917 01/03/13 2227         DVT Prophylaxis - Lovenox    LOS: 2 days   Zaineb Nowaczyk 01/05/2013, 8:49 AM

## 2013-01-05 NOTE — Progress Notes (Signed)
Pt stable Leg erythema left decreased Mri - edema in bone - no def osteo This ulcer has not healed in 3 mos - edema in bone and joint effusion makes it likely it will not heal esp in light of decreased blood flow to lle However pt is not keen on surgery at this time for debridement/ metatarsal partial amputation, which also could struggle to heal Continue dressing changes for now, cellulitis improving Will follow

## 2013-01-05 NOTE — Progress Notes (Signed)
VASCULAR LAB PRELIMINARY  ARTERIAL  ABI completed:    RIGHT    LEFT    PRESSURE WAVEFORM  PRESSURE WAVEFORM  BRACHIAL 138 T BRACHIAL 140 T  DP   DP    AT 79 DM AT 74 DM  PT 53 DM PT 56 DM  PER   PER    GREAT TOE  Adequate perfusion GREAT TOE  Adequate perfusion    RIGHT LEFT  ABI 0.56 0.53    The study performed at [facility] on 11-22-11 reveals right ABI is 0.77 and left ABI is 0.64.  Today's ABI shows a decrease bilaterally.  [name], RVT 01/05/2013, 2:00 PM

## 2013-01-06 LAB — CBC
Hemoglobin: 12 g/dL — ABNORMAL LOW (ref 13.0–17.0)
MCV: 84.9 fL (ref 78.0–100.0)
Platelets: 168 10*3/uL (ref 150–400)
RBC: 4.11 MIL/uL — ABNORMAL LOW (ref 4.22–5.81)
WBC: 9.5 10*3/uL (ref 4.0–10.5)

## 2013-01-06 LAB — BASIC METABOLIC PANEL
CO2: 23 mEq/L (ref 19–32)
Calcium: 9 mg/dL (ref 8.4–10.5)
Chloride: 100 mEq/L (ref 96–112)
Potassium: 4.1 mEq/L (ref 3.5–5.1)
Sodium: 136 mEq/L (ref 135–145)

## 2013-01-06 LAB — VANCOMYCIN, TROUGH: Vancomycin Tr: 17.8 ug/mL (ref 10.0–20.0)

## 2013-01-06 NOTE — Progress Notes (Signed)
Patient reminded again that he needs to urinate in the urinal for accurate urine measurement. Patient continues to urine in toilet. Patient states he will not. Explained to patient that physician ordered  Strict I/O and we need  To be able to measure his intake and output accurately as the physician has requested. Patient on strict I/O per MD order. Will continued to encourage patient to urinate in urinal.

## 2013-01-06 NOTE — Progress Notes (Signed)
Subjective: No complaints this AM. Improving ulcer  Tol PO Voiding well Good appetite Ambulating to bathroom No fevers/chills Normal BMs   Objective: Vital signs in last 24 hours: Temp:  [98 F (36.7 C)-98.1 F (36.7 C)] 98 F (36.7 C) (01/19 0627) Pulse Rate:  [72-80] 72  (01/19 0627) Resp:  [18] 18  (01/19 0627) BP: (127-158)/(63-77) 130/63 mmHg (01/19 0627) SpO2:  [90 %-97 %] 97 % (01/19 0627) Weight:  [213 lb 6.4 oz (96.798 kg)] 213 lb 6.4 oz (96.798 kg) (01/18 1100) Weight change: -21 lb 9.8 oz (-9.803 kg) Last BM Date: 01/03/13  CBG (last 3)  No results found for this basename: GLUCAP:3 in the last 72 hours  Intake/Output from previous day:  Intake/Output Summary (Last 24 hours) at 01/06/13 1044 Last data filed at 01/06/13 0806  Gross per 24 hour  Intake 1657.67 ml  Output    650 ml  Net 1007.67 ml   01/18 0701 - 01/19 0700 In: 1657.7 [P.O.:840; I.V.:167.7; IV Piggyback:650] Out: 650 [Urine:650]   Physical Exam  General appearance: Alert and Oriented. Eyes: no scleral icterus Throat: oropharynx moist without erythema Resp: CTA Cardio:2/6 SEM, No MRG  GI: soft, non-tender; bowel sounds normal; no masses,  no organomegaly.  Overweight Extremities:LLE Redness 1/4 up shin, 1+ pitting edema,  diminished pulses.  Quarter sized ulcer.  No Foul odor or much drainage.   Lab Results:  [facility] 01/06/13 0550 01/05/13 0740  NA 136 139  K 4.1 3.6  CL 100 103  CO2 23 23  GLUCOSE 112* 102*  BUN 25* 26*  CREATININE 1.84* 1.72*  CALCIUM 9.0 8.7  MG -- --  PHOS -- --     Basename 01/03/13 1928  AST 29  ALT 18  ALKPHOS 68  BILITOT 0.9  PROT 6.9  ALBUMIN 3.0*     Basename 01/06/13 0550 01/05/13 0740  WBC 9.5 8.3  NEUTROABS -- --  HGB 12.0* 11.1*  HCT 34.9* 33.6*  MCV 84.9 84.8  PLT 168 137*    Lab Results  Component Value Date   INR 1.07 01/03/2013    No results found for this basename: CKTOTAL:3,CKMB:3,CKMBINDEX:3,TROPONINI:3 in the last 72  hours  No results found for this basename: TSH,T4TOTAL,FREET3,T3FREE,THYROIDAB in the last 72 hours  No results found for this basename: VITAMINB12:2,FOLATE:2,FERRITIN:2,TIBC:2,IRON:2,RETICCTPCT:2 in the last 72 hours  Micro Results: Recent Results (from the past 240 hour(s))  CULTURE, BLOOD (SINGLE)     Status: Normal (Preliminary result)   Collection Time   01/03/13  7:30 PM      Component Value Range Status Comment   Specimen Description BLOOD RIGHT ARM   Final    Special Requests BOTTLES DRAWN AEROBIC AND ANAEROBIC 10CC EACH   Final    Culture  Setup Time 01/04/2013 02:42   Final    Culture     Final    Value:        BLOOD CULTURE RECEIVED NO GROWTH TO DATE CULTURE WILL BE HELD FOR 5 DAYS BEFORE ISSUING A FINAL NEGATIVE REPORT   Report Status PENDING   Incomplete      Studies/Results: Mr Foot Left Wo Contrast  01/05/2013  *RADIOLOGY REPORT*  Clinical Data: Chronic and draining wound at the base of the fifth toe. Cellulitis of the lower leg.  MRI OF THE LEFT FOREFOOT WITHOUT CONTRAST  Technique:  Multiplanar, multisequence MR imaging was performed. No intravenous contrast was administered.  Comparison: Radiographs dated 01/03/2013  Findings: There is edema in the head of the  fifth metatarsal and in the base of the proximal phalangeal bone of the fifth toe.  There is a small effusion in the fifth metatarsal phalangeal joint.  Soft tissue ulceration is immediately lateral to the joint.  However, there is a bone destruction, periosteal reaction, or abscess.  The other bones of the forefoot appear normal.  There is a prominent subcutaneous edema on the dorsum of the foot.  IMPRESSION: Soft tissue ulceration lateral to the fifth metatarsal phalangeal joint.  The edema in the adjacent bones is not definitive for osteomyelitis and could be due to hyperemia.   Original Report Authenticated By: Francene Boyers, M.D.      Medications: Scheduled:    . aspirin EC  81 mg Oral q morning - 10a  .  carbidopa-levodopa  1 tablet Oral QPM  . clonazePAM  0.5-1 mg Oral BID  . enoxaparin (LOVENOX) injection  30 mg Subcutaneous Q24H  . feeding supplement  1 Container Oral TID BM  . furosemide  20 mg Oral Daily  . gabapentin  300 mg Oral TID  . levothyroxine  112 mcg Oral QAC breakfast  . mirtazapine  30 mg Oral QHS  . pantoprazole  40 mg Oral Q0600  . piperacillin-tazobactam (ZOSYN)  IV  3.375 g Intravenous Q8H  . QUEtiapine  25 mg Oral QHS  . sertraline  100 mg Oral QPM  . simvastatin  40 mg Oral QHS  . vancomycin  1,500 mg Intravenous Q24H   Continuous:    . sodium chloride 20 mL/hr at 01/05/13 1251     Assessment/Plan: Active Problems:  ANXIETY DEPRESSION  HYPERTENSION  COPD  Chronic kidney disease, stage III (moderate)  Foot ulcer  Cellulitis  PAD (peripheral artery disease)   Non Healing L Foot Wound/Ulcer and LLE Cellulitis in pt with known PAD-  - Zosyn/Vanco per pharmacy consult.  Leukocytosis has resolved.   - Elevate leg  - appreciate Wound care recs.  hydrogel daily, apply cover with dry dressing wrap with kerlix.   - appreciate Ortho following.  X ray and MRI  (-) for Osteo. Pt currently declining debridement and possible amputation.  Will cont to follow - He failed outpt conservative management and Wound care.  He was being evaluated for hyperbaric Chamber per Dr Jimmey Ralph.  - Pt has wound boot for ambulation. PT is working w/ patient   Moderate LE peripheral vascular occlusive disease. - He has never required surgical treatment.  - ABIs do show some decrease in blood flow w/ 0.5 B/L from 0.77 R and 0.64 L in December.   - followed by Dr Arbie Cookey.   - given his wound is showing improvement will hold off on vascular consult today. Continue to monitor   PAD-RAS s/p PCI 7/06  Peripheral neuropathy - Gabapentin 300 Mg Caps (Gabapentin) .... Take 1 tablet by mouth 3 times a day  Hypokalemia - Replaced K on admission. Currently resolved   COPD--Recently Quit  Smoker. Continue inhalers.  Nebs if needed  GERD/ H/O peptic stricture/ esophageal diverticula - PPI  HTN - BP OK.    CRI/CKD 3 with Cr baseline 1.6-1.7.  Cr remaining stable at 1.8. Difficult to calculate his output but appears to be stable. He is not compliant w/ strict I/Os. Weights show he has lost 22lbs, but appears to be inacurrate. Will base his kidney status on Cr stability.   sleep apnea and RLS- On Sinemet  Anxiety/Depression/Panic D/O - on mult meds.  Asp PNA event 08/2012  ID -  Anti-infectives     Start     Dose/Rate Route Frequency Ordered Stop   01/04/13 2000   vancomycin (VANCOCIN) 1,500 mg in sodium chloride 0.9 % 500 mL IVPB        1,500 mg 250 mL/hr over 120 Minutes Intravenous Every 24 hours 01/03/13 1914     01/04/13 0600   piperacillin-tazobactam (ZOSYN) IVPB 3.375 g        3.375 g 12.5 mL/hr over 240 Minutes Intravenous 3 times per day 01/03/13 1917     01/03/13 2000   piperacillin-tazobactam (ZOSYN) IVPB 3.375 g  Status:  Discontinued        3.375 g 100 mL/hr over 30 Minutes Intravenous 4 times per day 01/03/13 1837 01/03/13 1916   01/03/13 2000   vancomycin (VANCOCIN) 2,000 mg in sodium chloride 0.9 % 500 mL IVPB        2,000 mg 250 mL/hr over 120 Minutes Intravenous  Once 01/03/13 1912 01/04/13 0117   01/03/13 2000   piperacillin-tazobactam (ZOSYN) IVPB 3.375 g        3.375 g 100 mL/hr over 30 Minutes Intravenous  Once 01/03/13 1917 01/03/13 2227         DVT Prophylaxis - Lovenox  Dispo - home early this week    LOS: 3 days   Derelle Cockrell 01/06/2013, 10:44 AM

## 2013-01-06 NOTE — Progress Notes (Signed)
ANTIBIOTIC CONSULT NOTE - Follow up  Pharmacy Consult for vancomycin  Indication: cellulitis/wound infection  No Known Allergies  Patient Measurements: Height: 6' 0.05" (183 cm) (from documentation on 10/23/12) Weight: 213 lb 6.4 oz (96.798 kg) IBW/kg (Calculated) : 77.71   Vital Signs: Temp: 97.5 F (36.4 C) (01/19 1500) Temp src: Oral (01/19 1500) BP: 153/68 mmHg (01/19 1500) Pulse Rate: 81  (01/19 1500) Intake/Output from previous day: 01/18 0701 - 01/19 0700 In: 1657.7 [P.O.:840; I.V.:167.7; IV Piggyback:650] Out: 650 [Urine:650] Intake/Output from this shift: Total I/O In: 480 [P.O.:480] Out: -   Labs:  Basename 01/06/13 0550 01/05/13 0740 01/03/13 1928  WBC 9.5 8.3 12.8*  HGB 12.0* 11.1* 12.0*  PLT 168 137* 146*  LABCREA -- -- --  CREATININE 1.84* 1.72* 1.88*   Estimated Creatinine Clearance: 37.3 ml/min (by C-G formula based on Cr of 1.84). No results found for this basename: VANCOTROUGH:2,VANCOPEAK:2,VANCORANDOM:2,GENTTROUGH:2,GENTPEAK:2,GENTRANDOM:2,TOBRATROUGH:2,TOBRAPEAK:2,TOBRARND:2,AMIKACINPEAK:2,AMIKACINTROU:2,AMIKACIN:2, in the last 72 hours   Microbiology: Recent Results (from the past 720 hour(s))  CULTURE, BLOOD (SINGLE)     Status: Normal (Preliminary result)   Collection Time   01/03/13  7:30 PM      Component Value Range Status Comment   Specimen Description BLOOD RIGHT ARM   Final    Special Requests BOTTLES DRAWN AEROBIC AND ANAEROBIC 10CC EACH   Final    Culture  Setup Time 01/04/2013 02:42   Final    Culture     Final    Value:        BLOOD CULTURE RECEIVED NO GROWTH TO DATE CULTURE WILL BE HELD FOR 5 DAYS BEFORE ISSUING A FINAL NEGATIVE REPORT   Report Status PENDING   Incomplete     Medical History: Past Medical History  Diagnosis Date  . Hyperlipidemia   . COPD (chronic obstructive pulmonary disease)   . Leg pain   . GERD (gastroesophageal reflux disease)   . Depression   . Dizziness   . Productive cough   . Thyroid disease   .  Peripheral vascular disease   . Chronic kidney disease   . ED (erectile dysfunction)   . Hypertension     08/17/12 - wife denies    Medications:  Prescriptions prior to admission  Medication Sig Dispense Refill  . acetaminophen (TYLENOL) 325 MG tablet Take 650 mg by mouth every 6 (six) hours as needed. For pain or fever      . albuterol (PROVENTIL) (5 MG/ML) 0.5% nebulizer solution Take 2.5 mg by nebulization every 6 (six) hours as needed. For shortness of breath      . aspirin EC 81 MG tablet Take 81 mg by mouth every morning.       . carbidopa-levodopa (SINEMET) 10-100 MG per tablet Take 1 tablet by mouth every evening.       . clonazePAM (KLONOPIN) 1 MG tablet Take 0.5-1 mg by mouth 2 (two) times daily. scheduled      . feeding supplement (ENSURE) PUDG Take 1 Container by mouth 3 (three) times daily between meals.      . furosemide (LASIX) 20 MG tablet Take 20 mg by mouth daily as needed. For edema      . gabapentin (NEURONTIN) 300 MG capsule Take 300 mg by mouth 3 (three) times daily.      Marland Kitchen HYDROcodone-acetaminophen (NORCO/VICODIN) 5-325 MG per tablet Take 1 tablet by mouth every 6 (six) hours as needed. For pain      . ipratropium (ATROVENT) 0.02 % nebulizer solution Take 500 mcg by nebulization  every 6 (six) hours as needed. For shortness of breath      . levothyroxine (SYNTHROID, LEVOTHROID) 112 MCG tablet Take 112 mcg by mouth every morning.       . Melatonin 3 MG CAPS Take 3 mg by mouth every evening.       . mirtazapine (REMERON) 30 MG tablet Take 30 mg by mouth at bedtime.       Marland Kitchen QUEtiapine (SEROQUEL) 25 MG tablet Take 25 mg by mouth at bedtime.       . sertraline (ZOLOFT) 100 MG tablet Take 100 mg by mouth every evening.       . simvastatin (ZOCOR) 40 MG tablet Take 40 mg by mouth at bedtime.       . [DISCONTINUED] clonazePAM (KLONOPIN) 1 MG tablet Take 1 tablet (1 mg total) by mouth 2 (two) times daily as needed for anxiety (anxiety).  30 tablet     Assessment: 79 yo M on  day # 3 Vancomycin for non healing L foot wound/ulcer and LLE cellulitis. Patient  with known PAD,  s/p I&D.  Blood cx NGTD. Afebrile,  WBC decreased/ within normal.  SCr 1.84 , CrCl ~ 37 ml/min.  Weight     Goal of Therapy:  Vancomycin trough level ~15 mcg/ml  Plan:  Continue Vancomycin 1500 mg IV q24h and zosyn  3.375 g IV q8h (EI).  Follow up SCr, UOP, cultures, clinical course and adjust as clinically indicated.  Will check vancomycin trough tonight and adjust dose if needed.  Noah Delaine, RPh Clinical Pharmacist Pager: [phone]  01/06/2013 4:57 PM

## 2013-01-06 NOTE — Progress Notes (Signed)
Pt stable On ivabx Erythema LLE improved Surgery not required at this time but more likely than not in his future Follow up with me two weeks

## 2013-01-06 NOTE — Progress Notes (Signed)
ANTIBIOTIC CONSULT NOTE - Follow up  Pharmacy Consult for vancomycin  Indication: cellulitis/wound infection  No Known Allergies  Patient Measurements: Height: 6' 0.05" (183 cm) (from documentation on 10/23/12) Weight: 213 lb 6.4 oz (96.798 kg) IBW/kg (Calculated) : 77.71   Vital Signs: Temp: 97.5 F (36.4 C) (01/19 1500) Temp src: Oral (01/19 1500) BP: 153/68 mmHg (01/19 1500) Pulse Rate: 81  (01/19 1500) Intake/Output from previous day: 01/18 0701 - 01/19 0700 In: 1657.7 [P.O.:840; I.V.:167.7; IV Piggyback:650] Out: 650 [Urine:650] Intake/Output from this shift:    Labs:  Basename 01/06/13 0550 01/05/13 0740  WBC 9.5 8.3  HGB 12.0* 11.1*  PLT 168 137*  LABCREA -- --  CREATININE 1.84* 1.72*   Estimated Creatinine Clearance: 37.3 ml/min (by C-G formula based on Cr of 1.84).  Basename 01/06/13 1959  VANCOTROUGH 17.8  VANCOPEAK --  VANCORANDOM --  GENTTROUGH --  GENTPEAK --  GENTRANDOM --  TOBRATROUGH --  Nolen Mu --  TOBRARND --  AMIKACINPEAK --  AMIKACINTROU --  AMIKACIN --     Microbiology: Recent Results (from the past 720 hour(s))  CULTURE, BLOOD (SINGLE)     Status: Normal (Preliminary result)   Collection Time   01/03/13  7:30 PM      Component Value Range Status Comment   Specimen Description BLOOD RIGHT ARM   Final    Special Requests BOTTLES DRAWN AEROBIC AND ANAEROBIC 10CC EACH   Final    Culture  Setup Time 01/04/2013 02:42   Final    Culture     Final    Value:        BLOOD CULTURE RECEIVED NO GROWTH TO DATE CULTURE WILL BE HELD FOR 5 DAYS BEFORE ISSUING A FINAL NEGATIVE REPORT   Report Status PENDING   Incomplete     Medical History: Past Medical History  Diagnosis Date  . Hyperlipidemia   . COPD (chronic obstructive pulmonary disease)   . Leg pain   . GERD (gastroesophageal reflux disease)   . Depression   . Dizziness   . Productive cough   . Thyroid disease   . Peripheral vascular disease   . Chronic kidney disease   . ED  (erectile dysfunction)   . Hypertension     08/17/12 - wife denies    Medications:  Prescriptions prior to admission  Medication Sig Dispense Refill  . acetaminophen (TYLENOL) 325 MG tablet Take 650 mg by mouth every 6 (six) hours as needed. For pain or fever      . albuterol (PROVENTIL) (5 MG/ML) 0.5% nebulizer solution Take 2.5 mg by nebulization every 6 (six) hours as needed. For shortness of breath      . aspirin EC 81 MG tablet Take 81 mg by mouth every morning.       . carbidopa-levodopa (SINEMET) 10-100 MG per tablet Take 1 tablet by mouth every evening.       . clonazePAM (KLONOPIN) 1 MG tablet Take 0.5-1 mg by mouth 2 (two) times daily. scheduled      . feeding supplement (ENSURE) PUDG Take 1 Container by mouth 3 (three) times daily between meals.      . furosemide (LASIX) 20 MG tablet Take 20 mg by mouth daily as needed. For edema      . gabapentin (NEURONTIN) 300 MG capsule Take 300 mg by mouth 3 (three) times daily.      Marland Kitchen HYDROcodone-acetaminophen (NORCO/VICODIN) 5-325 MG per tablet Take 1 tablet by mouth every 6 (six) hours as needed. For pain      .  ipratropium (ATROVENT) 0.02 % nebulizer solution Take 500 mcg by nebulization every 6 (six) hours as needed. For shortness of breath      . levothyroxine (SYNTHROID, LEVOTHROID) 112 MCG tablet Take 112 mcg by mouth every morning.       . Melatonin 3 MG CAPS Take 3 mg by mouth every evening.       . mirtazapine (REMERON) 30 MG tablet Take 30 mg by mouth at bedtime.       Marland Kitchen QUEtiapine (SEROQUEL) 25 MG tablet Take 25 mg by mouth at bedtime.       . sertraline (ZOLOFT) 100 MG tablet Take 100 mg by mouth every evening.       . simvastatin (ZOCOR) 40 MG tablet Take 40 mg by mouth at bedtime.       . [DISCONTINUED] clonazePAM (KLONOPIN) 1 MG tablet Take 1 tablet (1 mg total) by mouth 2 (two) times daily as needed for anxiety (anxiety).  30 tablet     Assessment: Vancomycin trough = 17.8 mcg/ml on vancomycin 1500 mg IV q24h in this  79 yo  male for  non healing L foot wound/ulcer and LLE cellulitis.  SCr 1.84 , CrCl ~ 37 ml/min.  Weight =98.6kg. Goal trough 10-15 mcg/ml is appropriate for cellulitis. Likely to accumulate higher in this 79 y.o as he has only received 3 doses so far.  Since nurse had already started to infuse the 1500mg  dose and level is < 20 , I will continue with 1500 mg vancomycin tonight but decrease dose to 1000 mg IV q24h starting tomorrow. Targeting trough 10-15 mcg/ml     Goal of Therapy:  Vancomycin trough level = 10-15 mcg/ml  Plan:  Okay to give 1500mg  Vancomycin tonight then decrease dose to 1000 mg IV q24h.  Noah Delaine, RPh Clinical Pharmacist Pager: [phone]  01/06/2013 9:04 PM

## 2013-01-07 LAB — BASIC METABOLIC PANEL
CO2: 24 mEq/L (ref 19–32)
Chloride: 102 mEq/L (ref 96–112)
Creatinine, Ser: 1.76 mg/dL — ABNORMAL HIGH (ref 0.50–1.35)

## 2013-01-07 LAB — CBC
HCT: 34 % — ABNORMAL LOW (ref 39.0–52.0)
MCV: 84.2 fL (ref 78.0–100.0)
RBC: 4.04 MIL/uL — ABNORMAL LOW (ref 4.22–5.81)
RDW: 14.7 % (ref 11.5–15.5)
WBC: 8.9 10*3/uL (ref 4.0–10.5)

## 2013-01-07 MED ORDER — FUROSEMIDE 20 MG PO TABS
10.0000 mg | ORAL_TABLET | Freq: Two times a day (BID) | ORAL | Status: DC
  Administered 2013-01-07 – 2013-01-08 (×3): 10 mg via ORAL
  Filled 2013-01-07 (×5): qty 0.5

## 2013-01-07 MED ORDER — ENOXAPARIN SODIUM 40 MG/0.4ML ~~LOC~~ SOLN
40.0000 mg | SUBCUTANEOUS | Status: DC
  Administered 2013-01-07: 40 mg via SUBCUTANEOUS
  Filled 2013-01-07 (×2): qty 0.4

## 2013-01-07 MED ORDER — VANCOMYCIN HCL IN DEXTROSE 1-5 GM/200ML-% IV SOLN
1000.0000 mg | INTRAVENOUS | Status: DC
  Administered 2013-01-07: 1000 mg via INTRAVENOUS
  Filled 2013-01-07 (×2): qty 200

## 2013-01-07 NOTE — Progress Notes (Signed)
Physical Therapy Treatment Patient Details Name: [name] MRN: [id number] DOB: [birth year] Today's Date: 01/07/2013 Time: 7829-5621 PT Time Calculation (min): 18 min  PT Assessment / Plan / Recommendation Comments on Treatment Session  Pt was sitting EOB. Stated he was going to restroom. The post-op boot was on the other side of the room and the IV pole was still plugged in wall. Pt stated he was too tired and just wanted to get back in bed. Pt was educated on doctors orders to wear boot and  ability to ambulate prior to D/C home. With motivation and encouragement, pt agreed to particiapte and  became motivated to amb even further. Discussed PT goals. Will attempt steps next session.    Follow Up Recommendations  Home health PT;Supervision/Assistance - 24 hour     Does the patient have the potential to tolerate intense rehabilitation     Barriers to Discharge        Equipment Recommendations       Recommendations for Other Services    Frequency Min 3X/week   Plan      Precautions / Restrictions Precautions Precautions: Fall Required Braces or Orthoses: Other Brace/Splint Other Brace/Splint: Post-op boot on  when OOB. Restrictions Weight Bearing Restrictions: No       Mobility  Bed Mobility Supine to Sit: 5: Supervision;HOB elevated Sit to Supine: 5: Supervision;HOB elevated Transfers Sit to Stand: 5: Supervision;With upper extremity assist;From bed Stand to Sit: 5: Supervision;To bed;With upper extremity assist Ambulation/Gait Ambulation/Gait Assistance: 4: Min guard Ambulation Distance (Feet): 200 Feet Ambulation/Gait Assistance Details: Pt refused to use cane. Used the IV pole instead. Required lots of motivation to ambulate but willing  and motivated to ambulate further once out in hallway. Gait Pattern: Step-through pattern;Decreased stride length    Exercises     PT Diagnosis:    PT Problem List:   PT Treatment Interventions:     PT Goals Acute Rehab  PT Goals PT Goal: Sit to Stand - Progress: Progressing toward goal PT Goal: Stand to Sit - Progress: Progressing toward goal PT Goal: Ambulate - Progress: Progressing toward goal  Visit Information  Last PT Received On: 01/07/13 Assistance Needed: +1    Subjective Data      Cognition  Overall Cognitive Status: Appears within functional limits for tasks assessed/performed Arousal/Alertness: Awake/alert Orientation Level: Appears intact for tasks assessed Behavior During Session: [facility] for tasks performed    Balance     End of Session PT - End of Session Equipment Utilized During Treatment: Gait belt (post-op boot) Activity Tolerance: Patient tolerated treatment well Patient left: in bed;with call bell/phone within reach   GP     Lazaro Arms 01/07/2013, 10:50 AM

## 2013-01-07 NOTE — Progress Notes (Signed)
Seen and agreed 01/07/2013 Eldredge Veldhuizen Elizabeth PTA [phone] pager [phone] office    

## 2013-01-07 NOTE — Progress Notes (Signed)
Subjective: No complaints this AM. Improving ulcer/Edema and LLE Cellulitis. Appreciate help from PT, Dr August Saucer etc. Now has the boot I wanted. Ulcer Dressed. No C/O  Objective: Vital signs in last 24 hours: Temp:  [97.5 F (36.4 C)-98.6 F (37 C)] 98 F (36.7 C) (01/20 0544) Pulse Rate:  [68-81] 68  (01/20 0544) Resp:  [18] 18  (01/20 0544) BP: (128-153)/(60-68) 133/68 mmHg (01/20 0544) SpO2:  [95 %] 95 % (01/20 0544) Weight change:  Last BM Date: 01/06/13  CBG (last 3)  No results found for this basename: GLUCAP:3 in the last 72 hours  Intake/Output from previous day:  Intake/Output Summary (Last 24 hours) at 01/07/13 0720 Last data filed at 01/07/13 0500  Gross per 24 hour  Intake   1436 ml  Output      0 ml  Net   1436 ml   01/19 0701 - 01/20 0700 In: 1436 [P.O.:1200; I.V.:236] Out: -    Physical Exam  General appearance: Alert and Oriented. Eyes: no scleral icterus Throat: oropharynx moist without erythema Resp: CTA Cardio:2/6 SEM, No MRG  GI: soft, non-tender; bowel sounds normal; no masses,  no organomegaly.  Overweight Extremities:LLE Redness 1/4 up shin, 1+ pitting edema,  diminished pulses.  Quarter sized ulcer.  No Foul odor or much drainage.   Lab Results:  Basename 01/06/13 0550 01/05/13 0740  NA 136 139  K 4.1 3.6  CL 100 103  CO2 23 23  GLUCOSE 112* 102*  BUN 25* 26*  CREATININE 1.84* 1.72*  CALCIUM 9.0 8.7  MG -- --  PHOS -- --    No results found for this basename: AST:2,ALT:2,ALKPHOS:2,BILITOT:2,PROT:2,ALBUMIN:2 in the last 72 hours   Basename 01/06/13 0550 01/05/13 0740  WBC 9.5 8.3  NEUTROABS -- --  HGB 12.0* 11.1*  HCT 34.9* 33.6*  MCV 84.9 84.8  PLT 168 137*    Lab Results  Component Value Date   INR 1.07 01/03/2013    No results found for this basename: CKTOTAL:3,CKMB:3,CKMBINDEX:3,TROPONINI:3 in the last 72 hours  No results found for this basename: TSH,T4TOTAL,FREET3,T3FREE,THYROIDAB in the last 72 hours  No  results found for this basename: VITAMINB12:2,FOLATE:2,FERRITIN:2,TIBC:2,IRON:2,RETICCTPCT:2 in the last 72 hours  Micro Results: Recent Results (from the past 240 hour(s))  CULTURE, BLOOD (SINGLE)     Status: Normal (Preliminary result)   Collection Time   01/03/13  7:30 PM      Component Value Range Status Comment   Specimen Description BLOOD RIGHT ARM   Final    Special Requests BOTTLES DRAWN AEROBIC AND ANAEROBIC 10CC EACH   Final    Culture  Setup Time 01/04/2013 02:42   Final    Culture     Final    Value:        BLOOD CULTURE RECEIVED NO GROWTH TO DATE CULTURE WILL BE HELD FOR 5 DAYS BEFORE ISSUING A FINAL NEGATIVE REPORT   Report Status PENDING   Incomplete      Studies/Results: No results found.   Medications: Scheduled:    . aspirin EC  81 mg Oral q morning - 10a  . carbidopa-levodopa  1 tablet Oral QPM  . clonazePAM  0.5-1 mg Oral BID  . enoxaparin (LOVENOX) injection  30 mg Subcutaneous Q24H  . feeding supplement  1 Container Oral TID BM  . furosemide  20 mg Oral Daily  . gabapentin  300 mg Oral TID  . levothyroxine  112 mcg Oral QAC breakfast  . mirtazapine  30 mg Oral QHS  .  pantoprazole  40 mg Oral Q0600  . piperacillin-tazobactam (ZOSYN)  IV  3.375 g Intravenous Q8H  . QUEtiapine  25 mg Oral QHS  . sertraline  100 mg Oral QPM  . simvastatin  40 mg Oral QHS  . vancomycin  1,500 mg Intravenous Q24H   Continuous:    . sodium chloride 20 mL/hr at 01/05/13 1251     Assessment/Plan: Active Problems:  ANXIETY DEPRESSION  HYPERTENSION  COPD  Chronic kidney disease, stage III (moderate)  Foot ulcer  Cellulitis  PAD (peripheral artery disease)   Non Healing L Foot Wound/Ulcer and LLE Cellulitis in pt with known PAD-   - Zosyn/Vanco per pharmacy consult.  Leukocytosis has resolved. Consider PO Abx tomorrow am.  He still has warmth and redness 1/4 up his ankle,  The Cellulitis is much better but not there yet.  Of course some of his LE edema and venous  stasis is complicating the eval.  I offered to diurese him more but he is resistant stating Diurese "hurts his kidneys"  - Elevate leg  - appreciate Wound care recs.  hydrogel daily, apply cover with dry dressing wrap with kerlix.   - appreciate Ortho following.  X ray and MRI  (-) for Osteo. Pt currently declining debridement and possible amputation.  Dr August Saucer stated yesterday: "Surgery not required at this time but more likely than not in his future. Follow up with me two weeks"  - He failed outpt conservative management and Wound care.  He was being evaluated for hyperbaric Chamber per Dr Jimmey Ralph. I talked with Dr Rhys Martini nurse on Friday.  - Pt has wound boot for ambulation. PT is working w/ patient   Moderate LE peripheral vascular occlusive disease. - He has not yet required surgical treatment.  - ABIs do show some decrease in blood flow w/ 0.5 B/L from 0.77 R and 0.64 L in December.   - followed by Dr Arbie Cookey.   - given his wound is showing improvement will hold off on vascular consult until outpatient. Continue to monitor   PAD-RAS s/p PCI 7/06  Peripheral neuropathy - Gabapentin 300 Mg Caps (Gabapentin) .... Take 1 tablet by mouth 3 times a day  Hypokalemia - Replaced K on admission. Currently resolved   COPD--Recently Quit Smoker. Continue inhalers.  Nebs if needed  GERD/ H/O peptic stricture/ esophageal diverticula - PPI  HTN - BP OK.    CRI/CKD 3 with Cr baseline 1.6-1.7.  Cr remaining stable at 1.8.   sleep apnea and RLS- On Sinemet  Anxiety/Depression/Panic D/O - on mult meds.  Asp PNA event 08/2012  ID -  Anti-infectives     Start     Dose/Rate Route Frequency Ordered Stop   01/04/13 2000   vancomycin (VANCOCIN) 1,500 mg in sodium chloride 0.9 % 500 mL IVPB        1,500 mg 250 mL/hr over 120 Minutes Intravenous Every 24 hours 01/03/13 1914     01/04/13 0600  piperacillin-tazobactam (ZOSYN) IVPB 3.375 g       3.375 g 12.5 mL/hr over 240 Minutes Intravenous 3 times  per day 01/03/13 1917     01/03/13 2000   piperacillin-tazobactam (ZOSYN) IVPB 3.375 g  Status:  Discontinued        3.375 g 100 mL/hr over 30 Minutes Intravenous 4 times per day 01/03/13 1837 01/03/13 1916   01/03/13 2000   vancomycin (VANCOCIN) 2,000 mg in sodium chloride 0.9 % 500 mL IVPB  2,000 mg 250 mL/hr over 120 Minutes Intravenous  Once 01/03/13 1912 01/04/13 0117   01/03/13 2000  piperacillin-tazobactam (ZOSYN) IVPB 3.375 g       3.375 g 100 mL/hr over 30 Minutes Intravenous  Once 01/03/13 1917 01/03/13 2227         DVT Prophylaxis - Lovenox  Dispo - home 1-2 days on IV Abx    LOS: 4 days   Blandon Offerdahl M 01/07/2013, 7:20 AM

## 2013-01-08 LAB — CBC
HCT: 34.9 % — ABNORMAL LOW (ref 39.0–52.0)
MCV: 84.5 fL (ref 78.0–100.0)
RBC: 4.13 MIL/uL — ABNORMAL LOW (ref 4.22–5.81)
WBC: 10.5 10*3/uL (ref 4.0–10.5)

## 2013-01-08 LAB — BASIC METABOLIC PANEL
CO2: 25 mEq/L (ref 19–32)
Chloride: 101 mEq/L (ref 96–112)
Creatinine, Ser: 1.78 mg/dL — ABNORMAL HIGH (ref 0.50–1.35)
Sodium: 139 mEq/L (ref 135–145)

## 2013-01-08 MED ORDER — FUROSEMIDE 20 MG PO TABS: 20.0000 mg | ORAL_TABLET | Freq: Every day | ORAL | Status: DC

## 2013-01-08 MED ORDER — DOXYCYCLINE HYCLATE 50 MG PO CAPS: 100.0000 mg | ORAL_CAPSULE | Freq: Two times a day (BID) | ORAL | Status: DC

## 2013-01-08 NOTE — Progress Notes (Signed)
Physical Therapy Treatment Patient Details Name: [name] MRN: [id number] DOB: 18-Jan-1929 Today's Date: 01/08/2013 Time: 9528-4132 PT Time Calculation (min): 15 min  PT Assessment / Plan / Recommendation Comments on Treatment Session  Pt in better mood and more cooperative with treatment today. Attempted steps, good return demonstration but will continue to need reinforcement for safety and gait sequence. Educated pt on use of post op boot when standing and asking for assistance to don/doff.  .    Follow Up Recommendations  Home health PT;Supervision/Assistance - 24 hour     Does the patient have the potential to tolerate intense rehabilitation     Barriers to Discharge        Equipment Recommendations  None recommended by PT    Recommendations for Other Services    Frequency Min 3X/week   Plan Discharge plan remains appropriate    Precautions / Restrictions Precautions Precautions: Fall Required Braces or Orthoses: Other Brace/Splint Other Brace/Splint: post -oop boot on when OOB Restrictions Weight Bearing Restrictions: No   Pertinent Vitals/Pain no apparent distress     Mobility  Transfers Sit to Stand: 5: Supervision;With upper extremity assist;From bed Stand to Sit: To bed;With upper extremity assist;5: Supervision Ambulation/Gait Ambulation/Gait Assistance: 4: Min guard Ambulation Distance (Feet): 150 Feet Ambulation/Gait Assistance Details: Pt ambulates using IV pole in hand Gait Pattern: Step-through pattern;Decreased stride length Stairs: Yes Stairs Assistance: 4: Min assist Stairs Assistance Details (indicate cue type and reason): Cues for gait sequence and safety Stair Management Technique: Two rails;Forwards Number of Stairs: 2     Exercises     PT Diagnosis:    PT Problem List:   PT Treatment Interventions:     PT Goals Acute Rehab PT Goals PT Goal: Sit to Stand - Progress: Progressing toward goal PT Goal: Stand to Sit - Progress:  Progressing toward goal PT Goal: Ambulate - Progress: Progressing toward goal PT Goal: Up/Down Stairs - Progress: Progressing toward goal  Visit Information  Last PT Received On: 01/08/13 Assistance Needed: +1    Subjective Data      Cognition  Overall Cognitive Status: Appears within functional limits for tasks assessed/performed Arousal/Alertness: Awake/alert Orientation Level: Appears intact for tasks assessed Behavior During Session: [facility] for tasks performed    Balance     End of Session PT - End of Session Equipment Utilized During Treatment: Gait belt;Other (comment) (Post-op boot) Activity Tolerance: Patient tolerated treatment well Patient left: in bed;with call bell/phone within reach   GP     Lazaro Arms 01/08/2013, 12:32 PM

## 2013-01-08 NOTE — Progress Notes (Signed)
CARE MANAGEMENT NOTE 01/08/2013  Patient:  [name], [name]   Account Number:  [id number]  Date Initiated:  01/08/2013  Documentation initiated by:  Vance Peper  Subjective/Objective Assessment:   79 yr old male s/p left leg/foot cellulitis     Action/Plan:   CM spoke with patient concerning home health needs at discharge. Choice offered. Patient lives home with wife. Will f/u with wound center for wound care.   Anticipated DC Date:  01/09/2013   Anticipated DC Plan:  HOME W HOME HEALTH SERVICES      DC Planning Services  CM consult      [facility] Choice  HOME HEALTH   Choice offered to / List presented to:  C-1 Patient        HH arranged  HH-2 PT      [facility] agency  [facility]   Status of service:  Completed, signed off Medicare Important Message given?   (If response is "NO", the following Medicare IM given date fields will be blank) Date Medicare IM given:   Date Additional Medicare IM given:    Discharge Disposition:  HOME W HOME HEALTH SERVICES  Per UR Regulation:    If discussed at Long Length of Stay Meetings, dates discussed:    Comments:

## 2013-01-08 NOTE — Discharge Summary (Signed)
Physician Discharge Summary  DISCHARGE SUMMARY   Patient ID: [name] MR#: [id number] DOB/AGE: 1929-07-14 79 y.o.   Attending Physician:Jniya Madara M  Patient's WUJ:WJXBJ,YNWG M, MD  Consults: Dr August Saucer - Ortho  Admit date: 01/03/2013 Discharge date: 01/08/2013  Discharge Diagnoses:  Active Problems:  ANXIETY DEPRESSION  HYPERTENSION  COPD  Chronic kidney disease, stage III (moderate)  Foot ulcer  Cellulitis  PAD (peripheral artery disease)   Patient Active Problem List  Diagnosis  . TUBULOVILLOUS ADENOMA, COLON  . HYPOTHYROIDISM  . DYSLIPIDEMIA  . ANXIETY DEPRESSION  . RHEUMATIC FEVER  . HYPERTENSION  . ATHEROSCLEROSIS  . COPD  . ESOPHAGEAL MOTILITY DISORDER  . GERD  . ESOPHAGEAL DIVERTICULUM  . INGUINAL HERNIAS, BILATERAL  . HERNIA, UMBILICAL  . DIVERTICULOSIS OF COLON  . MELANOSIS COLI  . CHOLELITHIASIS  . UNSPECIFIED RENAL SCLEROSIS  . SLEEP APNEA  . Aspiration pneumonia  . Toxic encephalopathy  . COPD with acute exacerbation  . Sacral decubitus ulcer  . Chronic pain  . Anxiety disorder  . Chronic kidney disease, stage III (moderate)  . Leg swelling  . Atherosclerosis of native arteries of the extremities with ulceration(440.23)  . Foot ulcer  . Cellulitis  . PAD (peripheral artery disease)   Past Medical History  Diagnosis Date  . Hyperlipidemia   . COPD (chronic obstructive pulmonary disease)   . Leg pain   . GERD (gastroesophageal reflux disease)   . Depression   . Dizziness   . Productive cough   . Thyroid disease   . Peripheral vascular disease   . Chronic kidney disease   . ED (erectile dysfunction)   . Hypertension     08/17/12 - wife denies    Discharged Condition: good   Discharge Medications:   Medication List     As of 01/08/2013  7:35 AM    TAKE these medications         acetaminophen 325 MG tablet   Commonly known as: TYLENOL   Take 650 mg by mouth every 6 (six) hours as needed. For pain or fever     albuterol (5 MG/ML) 0.5% nebulizer solution   Commonly known as: PROVENTIL   Take 2.5 mg by nebulization every 6 (six) hours as needed. For shortness of breath      aspirin EC 81 MG tablet   Take 81 mg by mouth every morning.      carbidopa-levodopa 10-100 MG per tablet   Commonly known as: SINEMET IR   Take 1 tablet by mouth every evening.      clonazePAM 1 MG tablet   Commonly known as: KLONOPIN   Take 0.5-1 mg by mouth 2 (two) times daily. scheduled      doxycycline 50 MG capsule   Commonly known as: VIBRAMYCIN   Take 2 capsules (100 mg total) by mouth 2 (two) times daily.      feeding supplement Pudg   Take 1 Container by mouth 3 (three) times daily between meals.      furosemide 20 MG tablet   Commonly known as: LASIX   Take 1 tablet (20 mg total) by mouth daily. For edema      gabapentin 300 MG capsule   Commonly known as: NEURONTIN   Take 300 mg by mouth 3 (three) times daily.      HYDROcodone-acetaminophen 5-325 MG per tablet   Commonly known as: NORCO/VICODIN   Take 1 tablet by mouth every 6 (six) hours as needed. For pain  ipratropium 0.02 % nebulizer solution   Commonly known as: ATROVENT   Take 500 mcg by nebulization every 6 (six) hours as needed. For shortness of breath      levothyroxine 112 MCG tablet   Commonly known as: SYNTHROID, LEVOTHROID   Take 112 mcg by mouth every morning.      Melatonin 3 MG Caps   Take 3 mg by mouth every evening.      mirtazapine 30 MG tablet   Commonly known as: REMERON   Take 30 mg by mouth at bedtime.      QUEtiapine 25 MG tablet   Commonly known as: SEROQUEL   Take 25 mg by mouth at bedtime.      sertraline 100 MG tablet   Commonly known as: ZOLOFT   Take 100 mg by mouth every evening.      simvastatin 40 MG tablet   Commonly known as: ZOCOR   Take 40 mg by mouth at bedtime.         Hospital Procedures: Mr Foot Left Wo Contrast  01/05/2013  *RADIOLOGY REPORT*  Clinical Data: Chronic and draining  wound at the base of the fifth toe. Cellulitis of the lower leg.  MRI OF THE LEFT FOREFOOT WITHOUT CONTRAST  Technique:  Multiplanar, multisequence MR imaging was performed. No intravenous contrast was administered.  Comparison: Radiographs dated 01/03/2013  Findings: There is edema in the head of the fifth metatarsal and in the base of the proximal phalangeal bone of the fifth toe.  There is a small effusion in the fifth metatarsal phalangeal joint.  Soft tissue ulceration is immediately lateral to the joint.  However, there is a bone destruction, periosteal reaction, or abscess.  The other bones of the forefoot appear normal.  There is a prominent subcutaneous edema on the dorsum of the foot.  IMPRESSION: Soft tissue ulceration lateral to the fifth metatarsal phalangeal joint.  The edema in the adjacent bones is not definitive for osteomyelitis and could be due to hyperemia.   Original Report Authenticated By: Francene Boyers, M.D.    Dg Foot Complete Left  01/04/2013  *RADIOLOGY REPORT*  Clinical Data: Ulcer  LEFT FOOT - COMPLETE 3+ VIEW  Comparison: None.  Findings: Soft tissue defect lateral to the fifth MTP joint.  No definite cortical destruction to suggest osteomyelitis.  Mild diffuse osteopenia.  Normal alignment.  Negative for fracture or other acute bony abnormality.  No significant osseous degenerative change.  Small calcaneal spur.  Patchy vascular calcifications.  IMPRESSION:  No radiographic evidence of osteomyelitis.  MRI is a more sensitive study.   Original Report Authenticated By: D. Andria Rhein, MD     History of Present Illness: Admitted for LLE Cellulitis and L Foot Wound that has been present  2-3 months failing outpatient conservative management.  The only improvement the wound has had is that the drainage is markedly down.  X rays had ruled out Osteo.  He is hooked up with Wound center and has seen Dr Jimmey Ralph twice.  Hospital Course: Admitted for L Foot Wound and LLE Cellulitis placed  on Zosyn/Vanco, Foot kept in elevated position, he was diuresed, he was seen by PT and given a Boot to keep pressure off the wound while walking, Wound care saw him and recommended wound care, and he was seen by Dr Dorene Grebe.  MRI did not reveal Osteo: Soft tissue ulceration lateral to the fifth metatarsal phalangeal joint. The edema in the adjacent bones is not definitive for osteomyelitis and  could be due to hyperemia. LE Vasc dopplers showed progressive Vasc disease:   RIGHT    LEFT     PRESSURE  WAVEFORM   PRESSURE  WAVEFORM   BRACHIAL  138  T  BRACHIAL  140  T   DP    DP     AT  79  DM  AT  74  DM   PT  53  DM  PT  56  DM   PER    PER     GREAT TOE   Adequate perfusion  GREAT TOE   Adequate  perfusion     RIGHT  LEFT   ABI  0.56  0.53   The study performed at [facility] on 11-22-11 reveals right ABI is 0.77 and left ABI is 0.64. ABI's show a decrease bilaterally.  Over the course of the 5+ days in the hospital his edema improved, his pain improved, his ulcer was stable to slightly improved, and the painful hot erythema was down 75-80%+.  Today 01/08/13 he will be switched to oral Abx and d/ced home to complete care.  He will need to follow up with Ortho, Wound care, me and vasc surgery.  His Leukocytosis has also resolved.  He remained Afebrile.   Non Healing L Foot Wound/Ulcer and LLE Cellulitis in pt with known PAD-  - Zosyn/Vanco per pharmacy consult was given for 5 days and he will be D/ced on 8 more days of Doxy with close f/up. Leukocytosis has resolved.  He still has warmth and redness 1/4 up his ankle, but markedly improved from admission.  Of course some of his LE edema and venous stasis is complicating the eval. I wanted to push his diureis - but he is resistant stating Diurese "hurts his kidneys"  - On D/C he is instructed to Elevate leg  - Wound care recs: hydrogel daily, apply cover with dry dressing wrap with kerlix.  - appreciate Dr Johnell Comings following. X ray and MRI (-) for Osteo.  Pt currently declining debridement and possible amputation. Dr August Saucer stated 1/19: "Surgery not required at this time but more likely than not in his future. Follow up with me two weeks"  - He failed outpt conservative management and Wound care. He was being evaluated for hyperbaric Chamber per Dr Jimmey Ralph. I talked with Dr Rhys Martini nurse on Friday and encourage Mr [name] to call Dr Rhys Martini office and get seen by Friday.  - Pt has wound boot for ambulation. PT is working w/ patient - PT saw pt 1/21 and recommended: Home health PT;Supervision/Assistance - 24 hour.  Will set up.   Moderate LE peripheral vascular occlusive disease.  - He has not yet required surgical treatment yet.  - ABIs do show some decrease in blood flow w/ 0.5 B/L from 0.77 R and 0.64 L in December 2012.  - followed by Dr Arbie Cookey.  - given his wound is showing improvement will hold off on vascular consult until outpatient. Continue to monitor  PAD-RAS s/p PCI 7/06  Peripheral neuropathy - Gabapentin 300 Mg Caps (Gabapentin) .... Take 1 tablet by mouth 3 times a day  Hypokalemia - Replaced K on admission. Currently resolved  COPD--Recently Quit Smoker. Continue inhalers. Nebs if needed  GERD/ H/O peptic stricture/ esophageal diverticula - PPI  HTN - BP OK.  CRI/CKD 3 with Cr baseline 1.6-1.7. Cr remaining stable at 1.8.  sleep apnea and RLS- On Sinemet  Anxiety/Depression/Panic D/O - on mult meds.  Asp PNA event 08/2012 DVT  Proph provided in hospital.  Ok to D/C in stable condition today.  Day of Discharge Exam BP 148/57  Pulse 82  Temp 97.5 F (36.4 C) (Oral)  Resp 18  Ht 6' 0.05" (1.83 m)  Wt 96.798 kg (213 lb 6.4 oz)  BMI 28.90 kg/m2  SpO2 94%  Physical Exam: General appearance: Alert and Oriented.  Eyes: no scleral icterus  Throat: oropharynx moist without erythema  Resp: CTA  Cardio:2/6 SEM, No MRG  GI: soft, non-tender; bowel sounds normal; no masses, no organomegaly. Overweight  Extremities:LLE Redness  1/4 up shin, tr-1+ pitting edema, diminished pulses. Quarter sized ulcer. No Foul odor or much drainage.      Discharge Labs:  [facility] 01/08/13 0530 01/07/13 0650  NA 139 140  K 4.4 3.5  CL 101 102  CO2 25 24  GLUCOSE 90 102*  BUN 24* 25*  CREATININE 1.78* 1.76*  CALCIUM 8.8 8.8  MG -- --  PHOS -- --   No results found for this basename: AST:2,ALT:2,ALKPHOS:2,BILITOT:2,PROT:2,ALBUMIN:2 in the last 72 hours  Basename 01/08/13 0530 01/07/13 0650  WBC 10.5 8.9  NEUTROABS -- --  HGB 11.9* 11.6*  HCT 34.9* 34.0*  MCV 84.5 84.2  PLT 190 161   No results found for this basename: CKTOTAL:3,CKMB:3,CKMBINDEX:3,TROPONINI:3 in the last 72 hours No results found for this basename: TSH,T4TOTAL,FREET3,T3FREE,THYROIDAB in the last 72 hours No results found for this basename: VITAMINB12:2,FOLATE:2,FERRITIN:2,TIBC:2,IRON:2,RETICCTPCT:2 in the last 72 hours Lab Results  Component Value Date   INR 1.07 01/03/2013       Discharge instructions: Discharge Orders    Future Appointments: Provider: Department: Dept Phone: Center:   01/25/2013 8:00 AM [facility] [place] [facility] [address] [phone] None     [facility] Follow-up Information    Follow up with Cammy Copa, MD. Schedule an appointment as soon as possible for a visit in 2 weeks.   Contact information:   [address] [place] [address] [phone]       Follow up with Gwen Pounds, MD. Schedule an appointment as soon as possible for a visit in 10 days. (sooner, If symptoms worsen)    Contact information:   [address] [facility] [place] [address] [phone]       Follow up with EARLY, TODD, MD. Schedule an appointment as soon as possible for a visit in 3 weeks.   Contact information:   [address] [place] [address] [phone]       Follow up with [facility]             . In 2  days.   Contact information:   [address] [place] [phone]          Disposition: Home  Follow-up Appts: Follow-up with Dr. Timothy Lasso at [facility] in 1-2 wks.  Call for appointment.  Condition on Discharge:stable.  Tests Needing Follow-up: Wound care  Time spent in discharge (includes decision making & examination of pt): 35 min  Signed: Jermany Rimel M 01/08/2013, 7:35 AM

## 2013-01-08 NOTE — Progress Notes (Signed)
[facility]  Patient Status: New  [facility] is providing the following services: PT  If patient discharges after hours, please call [phone].   [name] 01/08/2013, 2:38 PM

## 2013-01-08 NOTE — Progress Notes (Signed)
Seen and agreed 11/18/2013 Devann Cribb Elizabeth PTA [phone] pager [phone] office    

## 2013-01-10 LAB — CULTURE, BLOOD (SINGLE): Culture: NO GROWTH

## 2013-01-25 ENCOUNTER — Encounter (HOSPITAL_COMMUNITY): Payer: Medicare Other

## 2013-02-05 ENCOUNTER — Encounter: Payer: Self-pay | Admitting: Vascular Surgery

## 2013-02-05 ENCOUNTER — Other Ambulatory Visit: Payer: Self-pay

## 2013-02-05 ENCOUNTER — Ambulatory Visit (INDEPENDENT_AMBULATORY_CARE_PROVIDER_SITE_OTHER): Payer: Medicare Other | Admitting: Vascular Surgery

## 2013-02-05 VITALS — BP 120/58 | HR 99 | Ht 72.0 in | Wt 234.0 lb

## 2013-02-05 DIAGNOSIS — L98499 Non-pressure chronic ulcer of skin of other sites with unspecified severity: Secondary | ICD-10-CM

## 2013-02-05 DIAGNOSIS — I739 Peripheral vascular disease, unspecified: Secondary | ICD-10-CM

## 2013-02-05 NOTE — Progress Notes (Signed)
The patient has today for concern regarding nonhealing ulceration over the fifth metatarsal head on his left foot. Abdomen the patient for many years. He has had chronic lower the claudication for many years which we have treated conservatively. He does have venous insufficiency as well with some swelling but no active venous ulcers. He reports that this wound on his left foot is been present for approximately 3 months and is progressing in the reports severe pain associated with this. He does have some history of cellulitis which has been treated with antibiotics. Despite local care and antibiotics he has had nonhealing of He reports that he has pain constantly in this is with lying down or standing or walking. He is requesting Dilaudid.wanted for pain and I extremity we will continue to his primary care, Dr. Timothy Lasso regulate this.  Past Medical History  Diagnosis Date  . Hyperlipidemia   . COPD (chronic obstructive pulmonary disease)   . Leg pain   . GERD (gastroesophageal reflux disease)   . Depression   . Dizziness   . Productive cough   . Thyroid disease   . Peripheral vascular disease   . Chronic kidney disease   . ED (erectile dysfunction)   . Hypertension     08/17/12 - wife denies    History  Substance Use Topics  . Smoking status: Former Smoker -- 1.00 packs/day for 65 years    Types: Cigarettes    Quit date: 08/17/2012  . Smokeless tobacco: Never Used  . Alcohol Use: No    Family History  Problem Relation Age of Onset  . Other Mother     AAA  . AAA (abdominal aortic aneurysm) Mother   . Heart attack Father   . Alcohol abuse Father   . Diabetes Father   . Other Father     amputation  . Diabetes Brother   . Coronary artery disease Brother   . Dementia Brother     No Known Allergies  Current outpatient prescriptions:acetaminophen (TYLENOL) 325 MG tablet, Take 650 mg by mouth every 6 (six) hours as needed. For pain or fever, Disp: , Rfl: ;  albuterol (PROVENTIL) (5  MG/ML) 0.5% nebulizer solution, Take 2.5 mg by nebulization every 6 (six) hours as needed. For shortness of breath, Disp: , Rfl: ;  aspirin EC 81 MG tablet, Take 81 mg by mouth every morning. , Disp: , Rfl:  carbidopa-levodopa (SINEMET) 10-100 MG per tablet, Take 1 tablet by mouth every evening. , Disp: , Rfl: ;  clonazePAM (KLONOPIN) 1 MG tablet, Take 0.5-1 mg by mouth 2 (two) times daily. scheduled, Disp: , Rfl: ;  doxycycline (VIBRAMYCIN) 50 MG capsule, Take 2 capsules (100 mg total) by mouth 2 (two) times daily., Disp: 16 capsule, Rfl: 0;  feeding supplement (ENSURE) PUDG, Take 1 Container by mouth 3 (three) times daily between meals., Disp: , Rfl:  gabapentin (NEURONTIN) 300 MG capsule, Take 300 mg by mouth 3 (three) times daily., Disp: , Rfl: ;  HYDROcodone-acetaminophen (NORCO/VICODIN) 5-325 MG per tablet, Take 1 tablet by mouth every 6 (six) hours as needed. For pain, Disp: , Rfl: ;  ipratropium (ATROVENT) 0.02 % nebulizer solution, Take 500 mcg by nebulization every 6 (six) hours as needed. For shortness of breath, Disp: , Rfl:  levothyroxine (SYNTHROID, LEVOTHROID) 112 MCG tablet, Take 112 mcg by mouth every morning. , Disp: , Rfl: ;  Melatonin 3 MG CAPS, Take 3 mg by mouth every evening. , Disp: , Rfl: ;  mirtazapine (REMERON) 30 MG  tablet, Take 30 mg by mouth at bedtime. , Disp: , Rfl: ;  QUEtiapine (SEROQUEL) 25 MG tablet, Take 25 mg by mouth at bedtime. , Disp: , Rfl: ;  sertraline (ZOLOFT) 100 MG tablet, Take 100 mg by mouth every evening. , Disp: , Rfl:  simvastatin (ZOCOR) 40 MG tablet, Take 40 mg by mouth at bedtime. , Disp: , Rfl: ;  furosemide (LASIX) 20 MG tablet, Take 1 tablet (20 mg total) by mouth daily. For edema, Disp: 30 tablet, Rfl:   BP 120/58  Pulse 99  Ht 6' (1.829 m)  Wt 234 lb (106.142 kg)  BMI 31.73 kg/m2  SpO2 98%  Body mass index is 31.73 kg/(m^2).       Physical exam well-developed a gentleman appearing stated age in no acute distress Radial and femoral  pulses are 2+ bilaterally. He has absent popliteal and distal pulses bilaterally Moderate pitting edema bilaterally He does have a 2 cm open ulcer with relatively clean based over the plantar aspect of his fifth metatarsal head on the left there is some superficial abrasion on the dorsum of his right foot with no evidence of nonhealing Neurologically he is grossly Respirations are nonlabored  Bases laboratory studies in the past and shows ankle arm index of 0.77 on the right and 0.64 on the left proximal 6 months ago. He does have known venous reflux bilaterally as well  Impression and plan nonhealing wound on his left foot with known SFA occlusive disease. I have recommended arteriography for further evaluation. I explained outside chance that he could have endovascular treatment with stenting. I explained that typically with suprasternal artery occlusive disease he would require bypass for improvement of flow. I do feel this is limb threatening. The patient is not willing to proceed with arteriography this time. He reports that he Bruce Jiles C. will be able to proceed with this would be the first week of March. We have scheduled this for March 5 at his convenience. We are certainly able to do this sooner if he is willing. I did explain that this is not emergent but certainly is urgent to get better blood flow to his foot we'll make further plans following his March 5 arteriogram. He does have known renal mild renal insufficiency with a creatinine baseline at 1.8 therefore we will limit contrast

## 2013-02-08 ENCOUNTER — Encounter (HOSPITAL_COMMUNITY): Payer: Self-pay | Admitting: Pharmacy Technician

## 2013-02-19 ENCOUNTER — Ambulatory Visit: Payer: Medicare Other | Admitting: Vascular Surgery

## 2013-02-20 ENCOUNTER — Encounter (HOSPITAL_COMMUNITY): Admission: RE | Payer: Self-pay | Source: Ambulatory Visit

## 2013-02-20 ENCOUNTER — Ambulatory Visit (HOSPITAL_COMMUNITY): Admission: RE | Admit: 2013-02-20 | Payer: Medicare Other | Source: Ambulatory Visit | Admitting: Vascular Surgery

## 2013-02-20 SURGERY — ABDOMINAL AORTAGRAM
Anesthesia: LOCAL

## 2013-03-05 ENCOUNTER — Other Ambulatory Visit: Payer: Self-pay

## 2013-03-07 ENCOUNTER — Encounter (HOSPITAL_BASED_OUTPATIENT_CLINIC_OR_DEPARTMENT_OTHER): Payer: Medicare Other | Attending: Internal Medicine

## 2013-03-07 DIAGNOSIS — J4489 Other specified chronic obstructive pulmonary disease: Secondary | ICD-10-CM | POA: Insufficient documentation

## 2013-03-07 DIAGNOSIS — K219 Gastro-esophageal reflux disease without esophagitis: Secondary | ICD-10-CM | POA: Insufficient documentation

## 2013-03-07 DIAGNOSIS — I739 Peripheral vascular disease, unspecified: Secondary | ICD-10-CM | POA: Insufficient documentation

## 2013-03-07 DIAGNOSIS — Z79899 Other long term (current) drug therapy: Secondary | ICD-10-CM | POA: Insufficient documentation

## 2013-03-07 DIAGNOSIS — L97509 Non-pressure chronic ulcer of other part of unspecified foot with unspecified severity: Secondary | ICD-10-CM | POA: Insufficient documentation

## 2013-03-07 DIAGNOSIS — J449 Chronic obstructive pulmonary disease, unspecified: Secondary | ICD-10-CM | POA: Insufficient documentation

## 2013-03-07 DIAGNOSIS — G5790 Unspecified mononeuropathy of unspecified lower limb: Secondary | ICD-10-CM | POA: Insufficient documentation

## 2013-03-07 DIAGNOSIS — I129 Hypertensive chronic kidney disease with stage 1 through stage 4 chronic kidney disease, or unspecified chronic kidney disease: Secondary | ICD-10-CM | POA: Insufficient documentation

## 2013-03-07 DIAGNOSIS — N189 Chronic kidney disease, unspecified: Secondary | ICD-10-CM | POA: Insufficient documentation

## 2013-03-07 DIAGNOSIS — Z7982 Long term (current) use of aspirin: Secondary | ICD-10-CM | POA: Insufficient documentation

## 2013-03-07 DIAGNOSIS — E785 Hyperlipidemia, unspecified: Secondary | ICD-10-CM | POA: Insufficient documentation

## 2013-03-07 DIAGNOSIS — E039 Hypothyroidism, unspecified: Secondary | ICD-10-CM | POA: Insufficient documentation

## 2013-03-08 NOTE — H&P (Signed)
NAME:  [name], BEGAY NO.:  [id number]  MEDICAL RECORD NO.:  [id number]  LOCATION:  FOOT                         FACILITY:  [facility]  PHYSICIAN:  Joanne Gavel, M.D.        DATE OF BIRTH:  12/07/1929  DATE OF ADMISSION:  03/07/2013 DATE OF DISCHARGE:                             HISTORY & PHYSICAL   CHIEF COMPLAINT:  Wound, left foot.  HISTORY OF PRESENT ILLNESS:  This is an [name] who has had wound of his left foot since August 2013.  He is not diabetic, but he does have peripheral arterial disease and profound neuropathy.  He has been treated by his family practitioner and by Dr. Arbie Cookey.  Dr. Arbie Cookey has obtained x-ray and MRI which does not reveal osteomyelitis.  He does have profound vascular insufficiency with an ABI of 0.56.  SOCIAL HISTORY:  Cigarettes, he just quit several months ago.  He smoked for more than 60 years.  Alcohol none.  MEDICATIONS:  Carvedilol, Synthroid, baby aspirin, clonazepam, mirtazapine, Zoloft, Seroquel, simvastatin, melatonin, propranolol, Viagra, gabapentin, fentanyl patch.  ALLERGIES:  None.  REVIEW OF SYSTEMS:  The patient has suffered from hyperlipidemia, COPD, GERD, depression, dizziness, hypothyroidism, peripheral vascular disease.  Chronic kidney disease, hypertension, peripheral neuropathy, nephrolithiasis, and congenital syphilis.  OPERATIONS:  He states that he had a left kidney stent, squamous cell carcinoma left ear, and tonsillectomy.  PHYSICAL EXAMINATION:  VITAL SIGNS:  Temperature 98.4 pulse 70 respiration 18, blood pressure 149/56. GENERAL APPEARANCE:  A well-developed, somewhat lethargic, in no distress. CHEST:  Clear. HEART:  Regular rhythm. ABDOMEN:  Not examined. EXTREMITIES:  Examination of the feet reveals peripheral pulses are not palpable.  There is a 1.8 x 2.0 x 0.4 wound on the plantar surface near the fifth metatarsal head.  This easily probes directly into the joint space.  IMPRESSION:   Ulcer of the left foot secondary to peripheral vascular disease and neuropathy.  After debriding necrotic subcutaneous tissue. The base of the wound is fairly clean.  I believe that this patient will need to be revascularized and then probably come to amputation of the fifth toe and ray.  In the meantime, we will keep the wound clean and culture has been taken of the tissue.     Joanne Gavel, M.D.     RA/MEDQ  D:  03/07/2013  T:  03/08/2013  Job:  [id number]

## 2013-03-11 ENCOUNTER — Encounter (HOSPITAL_COMMUNITY): Payer: Self-pay | Admitting: Respiratory Therapy

## 2013-03-20 ENCOUNTER — Ambulatory Visit (HOSPITAL_COMMUNITY)
Admission: RE | Admit: 2013-03-20 | Discharge: 2013-03-20 | Disposition: A | Discharge status: Home / Self Care | Payer: Medicare Other | Source: Ambulatory Visit | Attending: Vascular Surgery | Admitting: Vascular Surgery

## 2013-03-20 ENCOUNTER — Encounter (HOSPITAL_COMMUNITY)
Admission: RE | Disposition: A | Discharge status: Home / Self Care | Payer: Self-pay | Source: Ambulatory Visit | Attending: Vascular Surgery

## 2013-03-20 DIAGNOSIS — L98499 Non-pressure chronic ulcer of skin of other sites with unspecified severity: Secondary | ICD-10-CM | POA: Insufficient documentation

## 2013-03-20 DIAGNOSIS — N189 Chronic kidney disease, unspecified: Secondary | ICD-10-CM | POA: Insufficient documentation

## 2013-03-20 DIAGNOSIS — J4489 Other specified chronic obstructive pulmonary disease: Secondary | ICD-10-CM | POA: Insufficient documentation

## 2013-03-20 DIAGNOSIS — L97509 Non-pressure chronic ulcer of other part of unspecified foot with unspecified severity: Secondary | ICD-10-CM | POA: Insufficient documentation

## 2013-03-20 DIAGNOSIS — I739 Peripheral vascular disease, unspecified: Secondary | ICD-10-CM

## 2013-03-20 DIAGNOSIS — J449 Chronic obstructive pulmonary disease, unspecified: Secondary | ICD-10-CM | POA: Insufficient documentation

## 2013-03-20 DIAGNOSIS — I129 Hypertensive chronic kidney disease with stage 1 through stage 4 chronic kidney disease, or unspecified chronic kidney disease: Secondary | ICD-10-CM | POA: Insufficient documentation

## 2013-03-20 DIAGNOSIS — I7092 Chronic total occlusion of artery of the extremities: Secondary | ICD-10-CM | POA: Insufficient documentation

## 2013-03-20 HISTORY — PX: LOWER EXTREMITY ANGIOGRAM: SHX5508

## 2013-03-20 HISTORY — PX: ABDOMINAL AORTAGRAM: SHX5454

## 2013-03-20 LAB — POCT I-STAT, CHEM 8
BUN: 20 mg/dL (ref 6–23)
Calcium, Ion: 1.21 mmol/L (ref 1.13–1.30)
Chloride: 107 mEq/L (ref 96–112)
Glucose, Bld: 119 mg/dL — ABNORMAL HIGH (ref 70–99)

## 2013-03-20 LAB — GLUCOSE, CAPILLARY: Glucose-Capillary: 97 mg/dL (ref 70–99)

## 2013-03-20 SURGERY — ABDOMINAL AORTAGRAM
Anesthesia: LOCAL

## 2013-03-20 MED ORDER — MAGNESIUM SULFATE 40 MG/ML IJ SOLN: 2.0000 g | Freq: Once | INTRAMUSCULAR | Status: DC | PRN

## 2013-03-20 MED ORDER — DOPAMINE-DEXTROSE 3.2-5 MG/ML-% IV SOLN: 3.0000 ug/kg/min | INTRAVENOUS | Status: DC

## 2013-03-20 MED ORDER — FENTANYL CITRATE 0.05 MG/ML IJ SOLN
INTRAMUSCULAR | Status: AC
  Filled 2013-03-20: qty 2

## 2013-03-20 MED ORDER — SODIUM CHLORIDE 0.45 % IV SOLN: INTRAVENOUS | Status: DC

## 2013-03-20 MED ORDER — PHENOL 1.4 % MT LIQD: 1.0000 | OROMUCOSAL | Status: DC | PRN

## 2013-03-20 MED ORDER — SODIUM CHLORIDE 0.45 % IV SOLN
INTRAVENOUS | Status: AC
  Administered 2013-03-20: 13:00:00 via INTRAVENOUS

## 2013-03-20 MED ORDER — HYDRALAZINE HCL 20 MG/ML IJ SOLN: 10.0000 mg | INTRAMUSCULAR | Status: DC | PRN

## 2013-03-20 MED ORDER — METOPROLOL TARTRATE 1 MG/ML IV SOLN: 2.0000 mg | INTRAVENOUS | Status: DC | PRN

## 2013-03-20 MED ORDER — GUAIFENESIN-DM 100-10 MG/5ML PO SYRP: 15.0000 mL | ORAL_SOLUTION | ORAL | Status: DC | PRN

## 2013-03-20 MED ORDER — HEPARIN (PORCINE) IN NACL 2-0.9 UNIT/ML-% IJ SOLN
INTRAMUSCULAR | Status: AC
  Filled 2013-03-20: qty 1000

## 2013-03-20 MED ORDER — OXYCODONE HCL 5 MG PO TABS
5.0000 mg | ORAL_TABLET | ORAL | Status: DC | PRN
  Administered 2013-03-20: 10 mg via ORAL

## 2013-03-20 MED ORDER — ACETAMINOPHEN 325 MG PO TABS: 325.0000 mg | ORAL_TABLET | ORAL | Status: DC | PRN

## 2013-03-20 MED ORDER — OXYCODONE HCL 5 MG PO TABS
ORAL_TABLET | ORAL | Status: AC
  Filled 2013-03-20: qty 2

## 2013-03-20 MED ORDER — POTASSIUM CHLORIDE CRYS ER 20 MEQ PO TBCR: 20.0000 meq | EXTENDED_RELEASE_TABLET | Freq: Once | ORAL | Status: DC | PRN

## 2013-03-20 MED ORDER — LIDOCAINE HCL (PF) 1 % IJ SOLN
INTRAMUSCULAR | Status: AC
  Filled 2013-03-20: qty 30

## 2013-03-20 MED ORDER — ONDANSETRON HCL 4 MG/2ML IJ SOLN: 4.0000 mg | Freq: Four times a day (QID) | INTRAMUSCULAR | Status: DC | PRN

## 2013-03-20 MED ORDER — SODIUM CHLORIDE 0.9 % IV SOLN
INTRAVENOUS | Status: DC
  Administered 2013-03-20: 11:00:00 via INTRAVENOUS

## 2013-03-20 MED ORDER — SODIUM CHLORIDE 0.9 % IV SOLN: 500.0000 mL | Freq: Once | INTRAVENOUS | Status: DC | PRN

## 2013-03-20 MED ORDER — MIDAZOLAM HCL 2 MG/2ML IJ SOLN
INTRAMUSCULAR | Status: AC
  Filled 2013-03-20: qty 2

## 2013-03-20 MED ORDER — LABETALOL HCL 5 MG/ML IV SOLN: 10.0000 mg | INTRAVENOUS | Status: DC | PRN

## 2013-03-20 MED ORDER — ACETAMINOPHEN 325 MG RE SUPP: 325.0000 mg | RECTAL | Status: DC | PRN

## 2013-03-20 MED ORDER — ALUM & MAG HYDROXIDE-SIMETH 200-200-20 MG/5ML PO SUSP: 15.0000 mL | ORAL | Status: DC | PRN

## 2013-03-20 NOTE — H&P (Signed)
[name] Description: 79 year old male  02/05/2013 2:15 PM Office Visit Provider: Larina Earthly, MD  MRN: [id number] Department: [facility]            Diagnoses Reason for Visit   Atherosclerosis of native arteries of the extremities with ulceration(440.23) - Primary  Re-evaluation   440.23  non healing ulcer/ Dr. Timothy Lasso           Current Vitals - Last Recorded    BP Pulse Ht Wt BMI SpO2   120/58 99 6' (1.829 m) 234 lb (106.142 kg) 31.73 kg/m2 98%            Progress Notes    Larina Earthly, MD at 02/05/2013 3:48 PM    Status: Signed             The patient has today for concern regarding nonhealing ulceration over the fifth metatarsal head on his left foot. Abdomen the patient for many years. He has had chronic lower the claudication for many years which we have treated conservatively. He does have venous insufficiency as well with some swelling but no active venous ulcers. He reports that this wound on his left foot is been present for approximately 3 months and is progressing in the reports severe pain associated with this. He does have some history of cellulitis which has been treated with antibiotics. Despite local care and antibiotics he has had nonhealing of He reports that he has pain constantly in this is with lying down or standing or walking. He is requesting Dilaudid.wanted for pain and I extremity we will continue to his primary care, Dr. Timothy Lasso regulate this.      Past Medical History      Diagnosis  Date      .  Hyperlipidemia       .  COPD (chronic obstructive pulmonary disease)       .  Leg pain       .  GERD (gastroesophageal reflux disease)       .  Depression       .  Dizziness       .  Productive cough       .  Thyroid disease       .  Peripheral vascular disease       .  Chronic kidney disease       .  ED (erectile dysfunction)       .  Hypertension         08/17/12 - wife denies      History      Substance Use Topics     .  Smoking status:  Former Smoker -- 1.00 packs/day for 65 years        Types:  Cigarettes        Quit date:  08/17/2012      .  Smokeless tobacco:  Never Used      .  Alcohol Use:  No      Family History      Problem  Relation  Age of Onset      .  Other  Mother         AAA      .  AAA (abdominal aortic aneurysm)  Mother       .  Heart attack  Father       .  Alcohol abuse  Father       .  Diabetes  Father       .  Other  Father         amputation      .  Diabetes  Brother       .  Coronary artery disease  Brother       .  Dementia  Brother       No Known Allergies  Current outpatient prescriptions:acetaminophen (TYLENOL) 325 MG tablet, Take 650 mg by mouth every 6 (six) hours as needed. For pain or fever, Disp: , Rfl: ; albuterol (PROVENTIL) (5 MG/ML) 0.5% nebulizer solution, Take 2.5 mg by nebulization every 6 (six) hours as needed. For shortness of breath, Disp: , Rfl: ; aspirin EC 81 MG tablet, Take 81 mg by mouth every morning. , Disp: , Rfl:  carbidopa-levodopa (SINEMET) 10-100 MG per tablet, Take 1 tablet by mouth every evening. , Disp: , Rfl: ; clonazePAM (KLONOPIN) 1 MG tablet, Take 0.5-1 mg by mouth 2 (two) times daily. scheduled, Disp: , Rfl: ; doxycycline (VIBRAMYCIN) 50 MG capsule, Take 2 capsules (100 mg total) by mouth 2 (two) times daily., Disp: 16 capsule, Rfl: 0; feeding supplement (ENSURE) PUDG, Take 1 Container by mouth 3 (three) times daily between meals., Disp: , Rfl:  gabapentin (NEURONTIN) 300 MG capsule, Take 300 mg by mouth 3 (three) times daily., Disp: , Rfl: ; HYDROcodone-acetaminophen (NORCO/VICODIN) 5-325 MG per tablet, Take 1 tablet by mouth every 6 (six) hours as needed. For pain, Disp: , Rfl: ; ipratropium (ATROVENT) 0.02 % nebulizer solution, Take 500 mcg by nebulization every 6 (six) hours as needed. For shortness of breath, Disp: , Rfl:  levothyroxine (SYNTHROID, LEVOTHROID) 112 MCG tablet, Take 112 mcg by mouth every morning. , Disp: , Rfl: ; Melatonin 3 MG  CAPS, Take 3 mg by mouth every evening. , Disp: , Rfl: ; mirtazapine (REMERON) 30 MG tablet, Take 30 mg by mouth at bedtime. , Disp: , Rfl: ; QUEtiapine (SEROQUEL) 25 MG tablet, Take 25 mg by mouth at bedtime. , Disp: , Rfl: ; sertraline (ZOLOFT) 100 MG tablet, Take 100 mg by mouth every evening. , Disp: , Rfl:  simvastatin (ZOCOR) 40 MG tablet, Take 40 mg by mouth at bedtime. , Disp: , Rfl: ; furosemide (LASIX) 20 MG tablet, Take 1 tablet (20 mg total) by mouth daily. For edema, Disp: 30 tablet, Rfl:  BP 120/58  Pulse 99  Ht 6' (1.829 m)  Wt 234 lb (106.142 kg)  BMI 31.73 kg/m2  SpO2 98%  Body mass index is 31.73 kg/(m^2).  Physical exam well-developed a gentleman appearing stated age in no acute distress  Radial and femoral pulses are 2+ bilaterally. He has absent popliteal and distal pulses bilaterally  Moderate pitting edema bilaterally  He does have a 2 cm open ulcer with relatively clean based over the plantar aspect of his fifth metatarsal head on the left there is some superficial abrasion on the dorsum of his right foot with no evidence of nonhealing  Neurologically he is grossly  Respirations are nonlabored  Bases laboratory studies in the past and shows ankle arm index of 0.77 on the right and 0.64 on the left proximal 6 months ago. He does have known venous reflux bilaterally as well  Impression and plan nonhealing wound on his left foot with known SFA occlusive disease. I have recommended arteriography for further evaluation. I explained outside chance that he could have endovascular treatment with stenting. I explained that typically with suprasternal artery occlusive disease he would require  bypass for improvement of flow. I do feel this is limb threatening. The patient is not willing to proceed with arteriography this time. He reports that he Anacristina Steffek C. will be able to proceed with this would be the first week of March. We have scheduled this for March 5 at his convenience. We are  certainly able to do this sooner if he is willing. I did explain that this is not emergent but certainly is urgent to get better blood flow to his foot we'll make further plans following his March 5 arteriogram. He does have known renal mild renal insufficiency with a creatinine baseline at 1.8 therefore we will limit contrast            Encounter-Level Documents:       Scan on 03/07/2013 9:23 AM by Provider Default, MDScan on 03/07/2013 9:23 AM by Provider Default, MD                Addendum:  The patient has been re-examined and re-evaluated.  The patient's history and physical has been reviewed and is unchanged.    [name] is a 79 y.o. male is being admitted with pvd with left foot ulcer. All the risks, benefits and other treatment options have been discussed with the patient. The patient has consented to proceed with Procedure(s): ABDOMINAL AORTAGRAM as a surgical intervention.  [name] 03/20/2013 11:22 AM [facility]

## 2013-03-20 NOTE — Op Note (Signed)
OPERATIVE REPORT  Date of Surgery: 03/20/2013  Surgeon: Josephina Gip, MD  Assistant: Nurse  Pre-op Diagnosis: pvd with left foot ulcer--with left leg lower extremity occlusive disease  Post-op Diagnosis: Same  Procedure: Procedure(s): ABDOMINAL AORTAGRAM LOWER EXTREMITY ANGIOGRAM-left leg with second-order catheterization of left external iliac artery  Anesthesia: General  EBL: Minimal  Patient was taken to the peripheral endovascular lab placed in the supine position at which time both inguinal areas were prepped with Betadine scrub and solution draped in routine sterile manner. After an appropriate timeout was called the right common femoral artery was entered percutaneously guidewire passed into the suprarenal aorta under fluoroscopic guidance. 5 sheath was passed over the guidewire dilator removed a standard pigtail catheter positioned in the suprarenal aorta. Flush abdominal aortogram was performed injected 20 cc contrast 20 cc per second. There was one renal artery on the right which is widely patent. 2 renal arteries on the left smallest was the superiormost in the lower had a renal stent in place with mild stenosis within the stent. The infrarenal aorta was widely patent as were both common internal and external iliac arteries. Pigtail catheter was then removed over a guidewire left common iliac artery was cannulated using an IMA catheter. Glide wire was advanced down into the left external iliac pigtail catheter removed and an end hole catheter positioned in the distal left external iliac artery. Following this-4 static pictures of the left lower extremity were obtained injecting 10 cc of contrast on 3 occasions and 15 cc of contrast on the final occasion. This revealed the left common and profunda femoris arteries to be widely patent. The left superficial femoral artery was occluded at its origin reconstituting about 8 cm above the knee joint. The distal SFA and proximal popliteal artery  was widely patent across the knee joint with one vessel runoff to the anterior tibial artery which was a large vessel filling down to the ankle. There was some late filling of the peroneal artery which is a small diseased vessel area posterior tibial artery did not visualize. Having tolerated procedure well the catheter and sheath were removed adequate compression applied no complications insued.  Findings #1 widely patent infrarenal aorta bilateral common iliac internal iliac and external iliac systems #2 single right renal artery widely patent #32 left renal arteries the lowest has renal stent in place with mild in-stent stenosis and the smallest left renal is the more superior #4 widely patent left common femoral profunda femoris arteries #5 total occlusion left superficial femoral artery with reconstitution above the knee #6 widely patent above-knee popliteal artery across the knee joint with one vessel runoff the anterior tibial artery to the ankle is late filling of small peroneal artery  Patient taken to the cath lab holding area in stable condition  Complications: None  Procedure Details:   Josephina Gip, MD 03/20/2013 12:41 PM

## 2013-03-21 ENCOUNTER — Other Ambulatory Visit: Payer: Self-pay

## 2013-03-25 ENCOUNTER — Telehealth: Payer: Self-pay | Admitting: *Deleted

## 2013-03-25 NOTE — Telephone Encounter (Signed)
Mrs. [name] called to say Mr [name] wanted to postpone his surgery because he is sore and having "kidney problems". She said he has appt with the kidney MD this week. I offered a few new dates but she said he wanted to just call us back when he is ready.

## 2013-03-26 ENCOUNTER — Encounter (HOSPITAL_COMMUNITY): Admission: RE | Admit: 2013-03-26 | Payer: Medicare Other | Source: Ambulatory Visit

## 2013-04-01 ENCOUNTER — Inpatient Hospital Stay (HOSPITAL_COMMUNITY): Admission: RE | Admit: 2013-04-01 | Payer: Medicare Other | Source: Ambulatory Visit | Admitting: Vascular Surgery

## 2013-04-01 ENCOUNTER — Encounter (HOSPITAL_COMMUNITY): Admission: RE | Payer: Self-pay | Source: Ambulatory Visit

## 2013-04-01 SURGERY — BYPASS GRAFT FEMORAL-POPLITEAL ARTERY
Anesthesia: General | Site: Leg Upper | Laterality: Left

## 2013-04-08 ENCOUNTER — Other Ambulatory Visit (HOSPITAL_COMMUNITY): Payer: Self-pay | Admitting: Internal Medicine

## 2013-04-08 DIAGNOSIS — R110 Nausea: Secondary | ICD-10-CM

## 2013-04-08 DIAGNOSIS — R109 Unspecified abdominal pain: Secondary | ICD-10-CM

## 2013-04-16 ENCOUNTER — Encounter (HOSPITAL_COMMUNITY)
Admission: RE | Admit: 2013-04-16 | Discharge: 2013-04-16 | Disposition: A | Discharge status: Home / Self Care | Payer: Medicare Other | Source: Ambulatory Visit | Attending: Internal Medicine | Admitting: Internal Medicine

## 2013-04-16 DIAGNOSIS — R109 Unspecified abdominal pain: Secondary | ICD-10-CM | POA: Insufficient documentation

## 2013-04-16 DIAGNOSIS — R110 Nausea: Secondary | ICD-10-CM | POA: Insufficient documentation

## 2013-04-16 MED ORDER — TECHNETIUM TC 99M MEBROFENIN IV KIT
5.3000 | PACK | Freq: Once | INTRAVENOUS | Status: AC | PRN
  Administered 2013-04-16: 5 via INTRAVENOUS

## 2013-08-01 ENCOUNTER — Telehealth: Payer: Self-pay | Admitting: *Deleted

## 2013-08-01 NOTE — Telephone Encounter (Signed)
Mr [name] said he was getting along good and would call if he needed our services.

## 2014-03-17 ENCOUNTER — Encounter (HOSPITAL_COMMUNITY): Payer: Medicare Other

## 2014-03-17 ENCOUNTER — Other Ambulatory Visit (HOSPITAL_COMMUNITY): Payer: Medicare Other

## 2014-03-20 HISTORY — PX: LOWER EXTREMITY ANGIOGRAM: SHX5955

## 2014-03-26 ENCOUNTER — Other Ambulatory Visit (HOSPITAL_COMMUNITY): Payer: Self-pay | Admitting: Internal Medicine

## 2014-03-26 ENCOUNTER — Ambulatory Visit (HOSPITAL_COMMUNITY): Admission: RE | Admit: 2014-03-26 | Payer: Medicare Other | Source: Ambulatory Visit

## 2014-03-26 ENCOUNTER — Ambulatory Visit (HOSPITAL_COMMUNITY)
Admission: RE | Admit: 2014-03-26 | Discharge: 2014-03-26 | Disposition: A | Discharge status: Home / Self Care | Payer: Medicare Other | Source: Ambulatory Visit | Attending: Vascular Surgery | Admitting: Vascular Surgery

## 2014-03-26 DIAGNOSIS — I739 Peripheral vascular disease, unspecified: Secondary | ICD-10-CM

## 2014-04-21 ENCOUNTER — Encounter: Payer: Self-pay | Admitting: Vascular Surgery

## 2014-04-22 ENCOUNTER — Encounter: Payer: Self-pay | Admitting: Vascular Surgery

## 2014-04-22 ENCOUNTER — Ambulatory Visit (INDEPENDENT_AMBULATORY_CARE_PROVIDER_SITE_OTHER): Payer: Medicare Other | Admitting: Vascular Surgery

## 2014-04-22 VITALS — BP 168/67 | HR 75 | Resp 18 | Ht 71.0 in | Wt 241.3 lb

## 2014-04-22 DIAGNOSIS — M7989 Other specified soft tissue disorders: Secondary | ICD-10-CM

## 2014-04-22 DIAGNOSIS — I739 Peripheral vascular disease, unspecified: Secondary | ICD-10-CM

## 2014-04-22 DIAGNOSIS — L98499 Non-pressure chronic ulcer of skin of other sites with unspecified severity: Principal | ICD-10-CM

## 2014-04-22 NOTE — Progress Notes (Signed)
Continued followup of his lower extremity arterial insufficiency he is well known to me for many years with stable SFA occlusive disease. We've seen him 1 year ago with some nonhealing issues on his left foot. At that time it looked as though he may have to have her vascularization. Arteriogram at that time did confirm left SFA occlusion. He did heal this with conservative treatment without intervention. He presents today for further evaluation. He does have long history of chronic pain from multiple causes. He does have a marked lower extremity edema today more so in his right pounds left. He does have some healing superficial ulceration over his lateral fifth metatarsal head  Past Medical History  Diagnosis Date  . Hyperlipidemia   . COPD (chronic obstructive pulmonary disease)   . Leg pain   . GERD (gastroesophageal reflux disease)   . Depression   . Dizziness   . Productive cough   . Thyroid disease   . Peripheral vascular disease   . Chronic kidney disease   . ED (erectile dysfunction)   . Hypertension     08/17/12 - wife denies    History  Substance Use Topics  . Smoking status: Former Smoker -- 1.00 packs/day for 65 years    Types: Cigarettes    Quit date: 08/17/2012  . Smokeless tobacco: Never Used  . Alcohol Use: No    Family History  Problem Relation Age of Onset  . Other Mother     AAA  . AAA (abdominal aortic aneurysm) Mother   . Heart attack Father   . Alcohol abuse Father   . Diabetes Father   . Other Father     amputation  . Diabetes Brother   . Coronary artery disease Brother   . Dementia Brother     No Known Allergies  Current outpatient prescriptions:acetaminophen (TYLENOL) 325 MG tablet, Take 650 mg by mouth every 6 (six) hours as needed. For pain or fever, Disp: , Rfl: ;  albuterol (PROVENTIL) (5 MG/ML) 0.5% nebulizer solution, Take 2.5 mg by nebulization every 6 (six) hours as needed. For shortness of breath, Disp: , Rfl: ;  aspirin EC 81 MG tablet,  Take 81 mg by mouth every morning. , Disp: , Rfl:  carbidopa-levodopa (SINEMET) 10-100 MG per tablet, Take 1 tablet by mouth every evening. , Disp: , Rfl: ;  clonazePAM (KLONOPIN) 0.5 MG tablet, Take 0.5 mg by mouth 2 (two) times daily as needed for anxiety. Patient uses 0.5 mg in the morning, and 1 mg in the evening., Disp: , Rfl: ;  feeding supplement (ENSURE) PUDG, Take 1 Container by mouth 3 (three) times daily between meals., Disp: , Rfl:  fentaNYL (DURAGESIC - DOSED MCG/HR) 25 MCG/HR patch, Place 25 mcg onto the skin every 3 (three) days., Disp: , Rfl: ;  gabapentin (NEURONTIN) 300 MG capsule, Take 300 mg by mouth 2 (two) times daily. , Disp: , Rfl: ;  HYDROcodone-acetaminophen (NORCO/VICODIN) 5-325 MG per tablet, Take 1 tablet by mouth every 6 (six) hours as needed. For pain, Disp: , Rfl:  ipratropium (ATROVENT) 0.02 % nebulizer solution, Take 500 mcg by nebulization every 6 (six) hours as needed. For shortness of breath, Disp: , Rfl: ;  levothyroxine (SYNTHROID, LEVOTHROID) 112 MCG tablet, Take 112 mcg by mouth every morning. , Disp: , Rfl: ;  Melatonin 3 MG CAPS, Take 3 mg by mouth every evening. , Disp: , Rfl: ;  mirtazapine (REMERON) 30 MG tablet, Take 30 mg by mouth at bedtime. , Disp: ,  Rfl:  QUEtiapine (SEROQUEL) 25 MG tablet, Take 25 mg by mouth at bedtime. , Disp: , Rfl: ;  sertraline (ZOLOFT) 100 MG tablet, Take 100 mg by mouth every evening. , Disp: , Rfl: ;  simvastatin (ZOCOR) 40 MG tablet, Take 40 mg by mouth at bedtime. , Disp: , Rfl:   BP 168/67  Pulse 75  Resp 18  Ht 5\' 11"  (1.803 m)  Wt 241 lb 4.8 oz (109.453 kg)  BMI 33.67 kg/m2  Body mass index is 33.67 kg/(m^2).       Well-developed gentleman in no acute distress Absent popliteal and distal pulses bilaterally Marked pitting edema bilaterally Superficial ulceration which is healing of his left lateral fifth metatarsal head Area of what appears to be separation of an old callus on his right plantar aspect of his heel  without any open wounds  Noninvasive studies today were reviewed with the patient and his wife from or 8 2015. This was stable with maximum index of 0.64 on the right and 0.56 on the left  Impression and plan stable lower surety arterial insufficiency with no evidence of limb threatening ischemia. I did explain the potential use for Ace wrap for control of his marked edema. I doubt that he would be able to get a compression garments on with his other disabilities. They were reassured this discussion will see [place] on an as-needed basis

## 2014-07-22 ENCOUNTER — Telehealth: Payer: Self-pay

## 2014-07-22 NOTE — Telephone Encounter (Signed)
Phone call from pt's wife to report pt. having ongoing swelling in both legs and feet with right > left.  Reported pt. had increased pain in ankles and feet last weekend.  Reported he saw Dr. Virgina Jock, PCP, last week, and was prescribed new fluid medication for the swelling.  Stated "he doesn't elevate his legs a lot."  Also stated he only wears the compression stockings occasionally.  Reported he has trouble getting them on.  Advised that per Dr. Luther Parody last office note, it was recommended that pt. Use ace bandages for compression.  Discussed importance of the pt. applying the Ace bandages early in morning, before he gets up, prior to the swelling worsening.  Encouraged that pt. Elevate his legs above level of heart at regular intervals during day.  Advised wife that appt. will be scheduled with Dr. Donnetta Hutching, but reiterated importance of compression and elevation to manage the edema in pt's legs/ feet.  Reported pt. Has intermittent sores on lower legs/ ankles that come and go, where the pt. Scratches.  Advised if he develops open sore that does not heal, to contact office for earlier appt.  Verb. Understanding.  Discussed w/ Dr. Donnetta Hutching.  Stated pt. would not need to repeat ABI's at upcoming office visit.

## 2014-07-28 ENCOUNTER — Encounter: Payer: Self-pay | Admitting: Vascular Surgery

## 2014-07-29 ENCOUNTER — Encounter: Payer: Self-pay | Admitting: Vascular Surgery

## 2014-07-29 ENCOUNTER — Ambulatory Visit (INDEPENDENT_AMBULATORY_CARE_PROVIDER_SITE_OTHER): Payer: Medicare Other | Admitting: Vascular Surgery

## 2014-07-29 VITALS — BP 147/68 | HR 78 | Ht 71.0 in | Wt 244.0 lb

## 2014-07-29 DIAGNOSIS — I739 Peripheral vascular disease, unspecified: Secondary | ICD-10-CM

## 2014-07-29 DIAGNOSIS — M7989 Other specified soft tissue disorders: Secondary | ICD-10-CM

## 2014-07-29 DIAGNOSIS — L98499 Non-pressure chronic ulcer of skin of other sites with unspecified severity: Principal | ICD-10-CM

## 2014-07-29 NOTE — Progress Notes (Signed)
HISTORY AND PHYSICAL     CC:  Pain in feet  [name], MD  HPI: This is a 80 y.o. male who has known lower extremity arterial insufficiency with SFA occlusive disease.  He had an arteriogram by Dr. Kellie Simmering in 2014 for non a healing ulcer on his foot and that is when he was found to have SFA occlusive disease.  His ulceration on his left foot did eventually heal with conservative treatment.  He presents today with complaints of pain mainly in his right heel, but states both feet hurt.  He does have an ulcer on the heel of the right foot.  He is taking Vicodin for the pain, which is being managed by Dr. Virgina Jock.  He and his wife states that the swelling in his legs have improved since wearing compression stockings.  He is on a statin for his cholesterol.    Past Medical History  Diagnosis Date  . Hyperlipidemia   . COPD (chronic obstructive pulmonary disease)   . Leg pain   . GERD (gastroesophageal reflux disease)   . Depression   . Dizziness   . Productive cough   . Thyroid disease   . Peripheral vascular disease   . Chronic kidney disease   . ED (erectile dysfunction)   . Hypertension     08/17/12 - wife denies   Past Surgical History  Procedure Laterality Date  . Renal artery stent      left  . Kidney stone surgery  1989  . Lower extremity angiogram Left 03-20-2014    Dr. Tinnie Gens    No Known Allergies  Current Outpatient Prescriptions  Medication Sig Dispense Refill  . acetaminophen (TYLENOL) 325 MG tablet Take 650 mg by mouth every 6 (six) hours as needed. For pain or fever      . albuterol (PROVENTIL) (5 MG/ML) 0.5% nebulizer solution Take 2.5 mg by nebulization every 6 (six) hours as needed. For shortness of breath      . aspirin EC 81 MG tablet Take 81 mg by mouth every morning.       . carbidopa-levodopa (SINEMET) 10-100 MG per tablet Take 1 tablet by mouth every evening.       . clonazePAM (KLONOPIN) 0.5 MG tablet Take 0.5 mg by mouth 2 (two) times daily as  needed for anxiety. Patient uses 0.5 mg in the morning, and 1 mg in the evening.      . feeding supplement (ENSURE) PUDG Take 1 Container by mouth 3 (three) times daily between meals.      . fentaNYL (DURAGESIC - DOSED MCG/HR) 25 MCG/HR patch Place 25 mcg onto the skin every 3 (three) days.      Marland Kitchen gabapentin (NEURONTIN) 300 MG capsule Take 300 mg by mouth 2 (two) times daily.       Marland Kitchen HYDROcodone-acetaminophen (NORCO/VICODIN) 5-325 MG per tablet Take 1 tablet by mouth every 6 (six) hours as needed. For pain      . ipratropium (ATROVENT) 0.02 % nebulizer solution Take 500 mcg by nebulization every 6 (six) hours as needed. For shortness of breath      . levothyroxine (SYNTHROID, LEVOTHROID) 112 MCG tablet Take 112 mcg by mouth every morning.       . Melatonin 3 MG CAPS Take 3 mg by mouth every evening.       . mirtazapine (REMERON) 30 MG tablet Take 30 mg by mouth at bedtime.       Marland Kitchen QUEtiapine (SEROQUEL) 25 MG tablet  Take 25 mg by mouth at bedtime.       . sertraline (ZOLOFT) 100 MG tablet Take 100 mg by mouth every evening.       . simvastatin (ZOCOR) 40 MG tablet Take 40 mg by mouth at bedtime.        No current facility-administered medications for this visit.    Family History  Problem Relation Age of Onset  . Other Mother     AAA  . AAA (abdominal aortic aneurysm) Mother   . Heart attack Father   . Alcohol abuse Father   . Diabetes Father   . Other Father     amputation  . Diabetes Brother   . Coronary artery disease Brother   . Dementia Brother     History   Social History  . Marital Status: Married    Spouse Name: N/A    Number of Children: N/A  . Years of Education: N/A   Occupational History  . Not on file.   Social History Main Topics  . Smoking status: Former Smoker -- 1.00 packs/day for 65 years    Types: Cigarettes    Quit date: 08/17/2012  . Smokeless tobacco: Never Used  . Alcohol Use: No  . Drug Use: No  . Sexual Activity: Not on file   Other Topics  Concern  . Not on file   Social History Narrative  . No narrative on file     ROS: [x]  Positive   [ ]  Negative   [ ]  All sytems reviewed and are negative  Cardiovascular: []  chest pain/pressure []  palpitations []  SOB lying flat []  DOE []  pain in legs while walking []  pain in feet when lying flat []  hx of DVT []  hx of phlebitis []  swelling in legs []  varicose veins  Pulmonary: []  productive cough []  asthma []  wheezing  Neurologic: []  weakness in []  arms []  legs []  numbness in []  arms []  legs [] difficulty speaking or slurred speech []  temporary loss of vision in one eye []  dizziness  Hematologic: []  bleeding problems []  problems with blood clotting easily  GI []  vomiting blood []  blood in stool  GU: []  burning with urination []  blood in urine  Psychiatric: []  hx of major depression  Integumentary: []  rashes []  ulcers  Constitutional: []  fever []  chills   PHYSICAL EXAMINATION:  Filed Vitals:   07/29/14 1443  BP: 147/68  Pulse: 78   Body mass index is 34.05 kg/(m^2).  General:  WDWN in NAD Gait: Not observed HENT: WNL, normocephalic Pulmonary: normal non-labored breathing , Abdomen: obese Skin: without rashes, with an ulcer to the right heel.  The ulcer on the left lateral foot is essentially healed. Vascular Exam/Pulses:  Extremities: without ischemic changes, without Gangrene , without cellulitis; with open wounds to right heel;  + BLE swelling with right >left Musculoskeletal: no muscle wasting or atrophy  Neurologic: A&O X 3; Appropriate Affect ; SENSATION: normal; MOTOR FUNCTION:  moving all extremities equally. Speech is fluent/normal   Non-Invasive Vascular Imaging:  None today Previous ABI's 03/26/14: Right:  0.64 Left:  0.56  Pt meds includes: Statin:  Yes.   Beta Blocker:  No. Aspirin:  Yes.   ACEI:  No. ARB:  No. Other Antiplatelet/Anticoagulant:  No.   ASSESSMENT/PLAN:: 80 y.o. male who has known lower extremity  arterial insufficiency with SFA occlusive disease with mixed venous insufficiency who also has a hx of renal insufficiency.   -pt does have a wound on his right heel, which  is most likely a product of a thick callus that has been removed.   Given that the pt does have some renal insufficiency, would like to hold off on an arteriogram at this time.  Dr. Donnetta Hutching feels that this will heal on its own and given that he wound on his left foot healed, this one should heal as well since the ABI on the right is better than the left.  -pt knows that if this worsens or if he develops redness around the heel or the foot, he needs to present sooner for an arteriogram. -we will see him back in one month for a wound check to make sure he is healing the wound.   Leontine Locket, PA-C [facility] [phone]  Clinic MD:  Pt seen and examined in conjunction with Dr. Donnetta Hutching  I have examined the patient, reviewed and agree with above. Chronic lower surety artery insufficiency. He did undergo arteriogram 6-9 months ago for evaluation of his left foot. I felt that he would have adequate flow to heal this and fortunately this has been the case. He now presents with ulceration over the heel on the right foot. This does appear superficial and does not appear to involve the calcaneus. Again a long discussion with the patient and his wife present. I would recommend hydrogel treatment to this and local wound care. He isn't doing better with compression garments and I think this will help as well. We'll see him again in one month for followup of his wound. Would require arteriography for further evaluation. Hopefully we can continue conservative treatment and healing.  Avanelle Pixley, MD 07/29/2014 5:23 PM

## 2014-08-28 ENCOUNTER — Inpatient Hospital Stay (HOSPITAL_COMMUNITY): Payer: Medicare Other

## 2014-08-28 ENCOUNTER — Inpatient Hospital Stay (HOSPITAL_COMMUNITY)
Admission: AD | Admit: 2014-08-28 | Discharge: 2014-09-02 | DRG: 300 | Disposition: A | Discharge status: Home / Self Care | Payer: Medicare Other | Source: Ambulatory Visit | Attending: Internal Medicine | Admitting: Internal Medicine

## 2014-08-28 ENCOUNTER — Encounter (HOSPITAL_COMMUNITY): Payer: Self-pay | Admitting: General Practice

## 2014-08-28 DIAGNOSIS — D649 Anemia, unspecified: Secondary | ICD-10-CM | POA: Diagnosis present

## 2014-08-28 DIAGNOSIS — I872 Venous insufficiency (chronic) (peripheral): Secondary | ICD-10-CM | POA: Diagnosis present

## 2014-08-28 DIAGNOSIS — I359 Nonrheumatic aortic valve disorder, unspecified: Secondary | ICD-10-CM | POA: Diagnosis present

## 2014-08-28 DIAGNOSIS — J449 Chronic obstructive pulmonary disease, unspecified: Secondary | ICD-10-CM | POA: Diagnosis present

## 2014-08-28 DIAGNOSIS — Z23 Encounter for immunization: Secondary | ICD-10-CM | POA: Diagnosis not present

## 2014-08-28 DIAGNOSIS — E119 Type 2 diabetes mellitus without complications: Secondary | ICD-10-CM | POA: Diagnosis present

## 2014-08-28 DIAGNOSIS — N183 Chronic kidney disease, stage 3 unspecified: Secondary | ICD-10-CM | POA: Diagnosis present

## 2014-08-28 DIAGNOSIS — Z791 Long term (current) use of non-steroidal anti-inflammatories (NSAID): Secondary | ICD-10-CM | POA: Diagnosis not present

## 2014-08-28 DIAGNOSIS — K219 Gastro-esophageal reflux disease without esophagitis: Secondary | ICD-10-CM | POA: Diagnosis present

## 2014-08-28 DIAGNOSIS — G4733 Obstructive sleep apnea (adult) (pediatric): Secondary | ICD-10-CM | POA: Diagnosis present

## 2014-08-28 DIAGNOSIS — I7025 Atherosclerosis of native arteries of other extremities with ulceration: Secondary | ICD-10-CM | POA: Diagnosis present

## 2014-08-28 DIAGNOSIS — S81801A Unspecified open wound, right lower leg, initial encounter: Secondary | ICD-10-CM | POA: Diagnosis present

## 2014-08-28 DIAGNOSIS — L98499 Non-pressure chronic ulcer of skin of other sites with unspecified severity: Principal | ICD-10-CM | POA: Diagnosis present

## 2014-08-28 DIAGNOSIS — L02419 Cutaneous abscess of limb, unspecified: Secondary | ICD-10-CM | POA: Diagnosis not present

## 2014-08-28 DIAGNOSIS — G8929 Other chronic pain: Secondary | ICD-10-CM | POA: Diagnosis present

## 2014-08-28 DIAGNOSIS — E039 Hypothyroidism, unspecified: Secondary | ICD-10-CM | POA: Diagnosis present

## 2014-08-28 DIAGNOSIS — Z7982 Long term (current) use of aspirin: Secondary | ICD-10-CM

## 2014-08-28 DIAGNOSIS — D696 Thrombocytopenia, unspecified: Secondary | ICD-10-CM | POA: Diagnosis present

## 2014-08-28 DIAGNOSIS — E46 Unspecified protein-calorie malnutrition: Secondary | ICD-10-CM | POA: Diagnosis present

## 2014-08-28 DIAGNOSIS — Z8249 Family history of ischemic heart disease and other diseases of the circulatory system: Secondary | ICD-10-CM | POA: Diagnosis not present

## 2014-08-28 DIAGNOSIS — G2581 Restless legs syndrome: Secondary | ICD-10-CM | POA: Diagnosis present

## 2014-08-28 DIAGNOSIS — M79609 Pain in unspecified limb: Secondary | ICD-10-CM | POA: Diagnosis present

## 2014-08-28 DIAGNOSIS — M7989 Other specified soft tissue disorders: Secondary | ICD-10-CM

## 2014-08-28 DIAGNOSIS — I129 Hypertensive chronic kidney disease with stage 1 through stage 4 chronic kidney disease, or unspecified chronic kidney disease: Secondary | ICD-10-CM | POA: Diagnosis present

## 2014-08-28 DIAGNOSIS — J4489 Other specified chronic obstructive pulmonary disease: Secondary | ICD-10-CM | POA: Diagnosis present

## 2014-08-28 DIAGNOSIS — G609 Hereditary and idiopathic neuropathy, unspecified: Secondary | ICD-10-CM | POA: Diagnosis present

## 2014-08-28 DIAGNOSIS — Z87891 Personal history of nicotine dependence: Secondary | ICD-10-CM | POA: Diagnosis not present

## 2014-08-28 DIAGNOSIS — E785 Hyperlipidemia, unspecified: Secondary | ICD-10-CM | POA: Diagnosis present

## 2014-08-28 DIAGNOSIS — L97509 Non-pressure chronic ulcer of other part of unspecified foot with unspecified severity: Secondary | ICD-10-CM | POA: Diagnosis present

## 2014-08-28 DIAGNOSIS — I743 Embolism and thrombosis of arteries of the lower extremities: Secondary | ICD-10-CM | POA: Diagnosis present

## 2014-08-28 DIAGNOSIS — M19049 Primary osteoarthritis, unspecified hand: Secondary | ICD-10-CM | POA: Diagnosis present

## 2014-08-28 DIAGNOSIS — I771 Stricture of artery: Secondary | ICD-10-CM | POA: Diagnosis present

## 2014-08-28 DIAGNOSIS — Z833 Family history of diabetes mellitus: Secondary | ICD-10-CM | POA: Diagnosis not present

## 2014-08-28 DIAGNOSIS — R600 Localized edema: Secondary | ICD-10-CM | POA: Diagnosis present

## 2014-08-28 DIAGNOSIS — L97409 Non-pressure chronic ulcer of unspecified heel and midfoot with unspecified severity: Secondary | ICD-10-CM | POA: Diagnosis present

## 2014-08-28 DIAGNOSIS — L0390 Cellulitis, unspecified: Secondary | ICD-10-CM | POA: Diagnosis present

## 2014-08-28 DIAGNOSIS — I2789 Other specified pulmonary heart diseases: Secondary | ICD-10-CM | POA: Diagnosis present

## 2014-08-28 DIAGNOSIS — I739 Peripheral vascular disease, unspecified: Secondary | ICD-10-CM | POA: Diagnosis not present

## 2014-08-28 DIAGNOSIS — I4891 Unspecified atrial fibrillation: Secondary | ICD-10-CM | POA: Diagnosis present

## 2014-08-28 DIAGNOSIS — I00 Rheumatic fever without heart involvement: Secondary | ICD-10-CM

## 2014-08-28 DIAGNOSIS — L03119 Cellulitis of unspecified part of limb: Secondary | ICD-10-CM

## 2014-08-28 HISTORY — DX: Migraine, unspecified, not intractable, without status migrainosus: G43.909

## 2014-08-28 HISTORY — DX: Chronic kidney disease, stage 3 unspecified: N18.30

## 2014-08-28 HISTORY — DX: Anxiety disorder, unspecified: F41.9

## 2014-08-28 HISTORY — DX: Chronic kidney disease, stage 3 (moderate): N18.3

## 2014-08-28 HISTORY — DX: Personal history of other diseases of the digestive system: Z87.19

## 2014-08-28 HISTORY — DX: Hypothyroidism, unspecified: E03.9

## 2014-08-28 HISTORY — DX: Unspecified osteoarthritis, unspecified site: M19.90

## 2014-08-28 LAB — CBC
HEMATOCRIT: 34.7 % — AB (ref 39.0–52.0)
HEMOGLOBIN: 11.4 g/dL — AB (ref 13.0–17.0)
MCH: 27.9 pg (ref 26.0–34.0)
MCHC: 32.9 g/dL (ref 30.0–36.0)
MCV: 85 fL (ref 78.0–100.0)
Platelets: 135 10*3/uL — ABNORMAL LOW (ref 150–400)
RBC: 4.08 MIL/uL — ABNORMAL LOW (ref 4.22–5.81)
RDW: 14.4 % (ref 11.5–15.5)
WBC: 9 10*3/uL (ref 4.0–10.5)

## 2014-08-28 LAB — PHOSPHORUS: Phosphorus: 2.5 mg/dL (ref 2.3–4.6)

## 2014-08-28 LAB — COMPREHENSIVE METABOLIC PANEL
ALK PHOS: 80 U/L (ref 39–117)
ALT: 14 U/L (ref 0–53)
AST: 15 U/L (ref 0–37)
Albumin: 2.9 g/dL — ABNORMAL LOW (ref 3.5–5.2)
Anion gap: 14 (ref 5–15)
BILIRUBIN TOTAL: 0.7 mg/dL (ref 0.3–1.2)
BUN: 16 mg/dL (ref 6–23)
CHLORIDE: 101 meq/L (ref 96–112)
CO2: 23 meq/L (ref 19–32)
Calcium: 8.2 mg/dL — ABNORMAL LOW (ref 8.4–10.5)
Creatinine, Ser: 1.46 mg/dL — ABNORMAL HIGH (ref 0.50–1.35)
GFR calc Af Amer: 49 mL/min — ABNORMAL LOW (ref >90)
GFR, EST NON AFRICAN AMERICAN: 43 mL/min — AB (ref >90)
Glucose, Bld: 180 mg/dL — ABNORMAL HIGH (ref 70–99)
POTASSIUM: 3.8 meq/L (ref 3.7–5.3)
Sodium: 138 mEq/L (ref 137–147)
Total Protein: 6.4 g/dL (ref 6.0–8.3)

## 2014-08-28 LAB — TSH: TSH: 1.41 u[IU]/mL (ref 0.350–4.500)

## 2014-08-28 LAB — MAGNESIUM: MAGNESIUM: 1 mg/dL — AB (ref 1.5–2.5)

## 2014-08-28 LAB — PROTIME-INR
INR: 1.18 (ref 0.00–1.49)
Prothrombin Time: 15 seconds (ref 11.6–15.2)

## 2014-08-28 LAB — SEDIMENTATION RATE: Sed Rate: 68 mm/hr — ABNORMAL HIGH (ref 0–16)

## 2014-08-28 MED ORDER — ONDANSETRON HCL 4 MG/2ML IJ SOLN: 4.0000 mg | Freq: Four times a day (QID) | INTRAMUSCULAR | Status: DC | PRN

## 2014-08-28 MED ORDER — FUROSEMIDE 10 MG/ML IJ SOLN
INTRAMUSCULAR | Status: AC
  Filled 2014-08-28: qty 4

## 2014-08-28 MED ORDER — CLONAZEPAM 1 MG PO TABS: 1.0000 mg | ORAL_TABLET | Freq: Every evening | ORAL | Status: DC | PRN

## 2014-08-28 MED ORDER — ENSURE PUDDING PO PUDG
1.0000 | Freq: Three times a day (TID) | ORAL | Status: DC
Start: 2014-08-28 — End: 2014-08-29
  Administered 2014-08-28: 1 via ORAL

## 2014-08-28 MED ORDER — CARBIDOPA-LEVODOPA 10-100 MG PO TABS
1.0000 | ORAL_TABLET | Freq: Every evening | ORAL | Status: DC
  Administered 2014-08-28 – 2014-09-01 (×5): 1 via ORAL
  Filled 2014-08-28 (×6): qty 1

## 2014-08-28 MED ORDER — ONDANSETRON HCL 4 MG PO TABS: 4.0000 mg | ORAL_TABLET | Freq: Four times a day (QID) | ORAL | Status: DC | PRN

## 2014-08-28 MED ORDER — CLONAZEPAM 0.5 MG PO TABS: 0.5000 mg | ORAL_TABLET | Freq: Every day | ORAL | Status: DC | PRN

## 2014-08-28 MED ORDER — HYDROCODONE-ACETAMINOPHEN 5-325 MG PO TABS
1.0000 | ORAL_TABLET | Freq: Four times a day (QID) | ORAL | Status: DC | PRN
  Administered 2014-08-28 – 2014-09-02 (×6): 1 via ORAL
  Filled 2014-08-28 (×7): qty 1

## 2014-08-28 MED ORDER — QUETIAPINE FUMARATE 25 MG PO TABS
25.0000 mg | ORAL_TABLET | Freq: Every day | ORAL | Status: DC
  Administered 2014-08-28 – 2014-09-01 (×5): 25 mg via ORAL
  Filled 2014-08-28 (×6): qty 1

## 2014-08-28 MED ORDER — ALBUTEROL SULFATE (5 MG/ML) 0.5% IN NEBU: 2.5000 mg | INHALATION_SOLUTION | Freq: Four times a day (QID) | RESPIRATORY_TRACT | Status: DC | PRN

## 2014-08-28 MED ORDER — ALBUTEROL SULFATE (2.5 MG/3ML) 0.083% IN NEBU: 2.5000 mg | INHALATION_SOLUTION | Freq: Four times a day (QID) | RESPIRATORY_TRACT | Status: DC | PRN

## 2014-08-28 MED ORDER — VANCOMYCIN HCL IN DEXTROSE 1-5 GM/200ML-% IV SOLN: 1000.0000 mg | INTRAVENOUS | Status: DC

## 2014-08-28 MED ORDER — CLONAZEPAM 0.5 MG PO TABS: 0.5000 mg | ORAL_TABLET | Freq: Two times a day (BID) | ORAL | Status: DC | PRN

## 2014-08-28 MED ORDER — VANCOMYCIN HCL 10 G IV SOLR
1500.0000 mg | INTRAVENOUS | Status: DC
  Administered 2014-08-28 – 2014-08-30 (×2): 1500 mg via INTRAVENOUS
  Filled 2014-08-28 (×3): qty 1500

## 2014-08-28 MED ORDER — ACETAMINOPHEN 325 MG PO TABS: 650.0000 mg | ORAL_TABLET | Freq: Four times a day (QID) | ORAL | Status: DC | PRN

## 2014-08-28 MED ORDER — FUROSEMIDE 10 MG/ML IJ SOLN
20.0000 mg | Freq: Every day | INTRAMUSCULAR | Status: DC
  Administered 2014-08-28 – 2014-08-31 (×4): 20 mg via INTRAVENOUS
  Filled 2014-08-28 (×5): qty 2

## 2014-08-28 MED ORDER — SIMVASTATIN 40 MG PO TABS
40.0000 mg | ORAL_TABLET | Freq: Every day | ORAL | Status: DC
  Administered 2014-08-28 – 2014-08-30 (×3): 40 mg via ORAL
  Filled 2014-08-28 (×4): qty 1

## 2014-08-28 MED ORDER — PIPERACILLIN-TAZOBACTAM 3.375 G IVPB 30 MIN
3.3750 g | Freq: Four times a day (QID) | INTRAVENOUS | Status: DC
Start: 2014-08-28 — End: 2014-08-28

## 2014-08-28 MED ORDER — LEVOTHYROXINE SODIUM 112 MCG PO TABS
112.0000 ug | ORAL_TABLET | Freq: Every day | ORAL | Status: DC
  Administered 2014-08-29 – 2014-09-02 (×5): 112 ug via ORAL
  Filled 2014-08-28 (×6): qty 1

## 2014-08-28 MED ORDER — PIPERACILLIN-TAZOBACTAM 3.375 G IVPB
3.3750 g | Freq: Three times a day (TID) | INTRAVENOUS | Status: DC
  Administered 2014-08-28 – 2014-09-02 (×13): 3.375 g via INTRAVENOUS
  Filled 2014-08-28 (×16): qty 50

## 2014-08-28 MED ORDER — INFLUENZA VAC SPLIT QUAD 0.5 ML IM SUSY
0.5000 mL | PREFILLED_SYRINGE | INTRAMUSCULAR | Status: AC
  Administered 2014-08-30: 0.5 mL via INTRAMUSCULAR
  Filled 2014-08-28: qty 0.5

## 2014-08-28 MED ORDER — ASPIRIN EC 81 MG PO TBEC
81.0000 mg | DELAYED_RELEASE_TABLET | Freq: Every morning | ORAL | Status: DC
  Administered 2014-08-29 – 2014-09-02 (×4): 81 mg via ORAL
  Filled 2014-08-28 (×5): qty 1

## 2014-08-28 MED ORDER — SODIUM CHLORIDE 0.9 % IV SOLN
INTRAVENOUS | Status: DC
  Administered 2014-08-28 – 2014-08-31 (×2): via INTRAVENOUS

## 2014-08-28 MED ORDER — SERTRALINE HCL 100 MG PO TABS
100.0000 mg | ORAL_TABLET | Freq: Every evening | ORAL | Status: DC
  Administered 2014-08-28 – 2014-09-01 (×5): 100 mg via ORAL
  Filled 2014-08-28 (×6): qty 1

## 2014-08-28 MED ORDER — FENTANYL 12 MCG/HR TD PT72
25.0000 ug | MEDICATED_PATCH | TRANSDERMAL | Status: DC
  Administered 2014-08-28 – 2014-08-31 (×2): 25 ug via TRANSDERMAL
  Filled 2014-08-28 (×2): qty 1

## 2014-08-28 MED ORDER — ENOXAPARIN SODIUM 30 MG/0.3ML ~~LOC~~ SOLN
30.0000 mg | SUBCUTANEOUS | Status: DC
  Administered 2014-08-28: 30 mg via SUBCUTANEOUS
  Filled 2014-08-28 (×2): qty 0.3

## 2014-08-28 MED ORDER — SENNA 8.6 MG PO TABS
1.0000 | ORAL_TABLET | Freq: Two times a day (BID) | ORAL | Status: DC
  Administered 2014-08-28 – 2014-09-02 (×8): 8.6 mg via ORAL
  Filled 2014-08-28 (×11): qty 1

## 2014-08-28 MED ORDER — MIRTAZAPINE 30 MG PO TABS
30.0000 mg | ORAL_TABLET | Freq: Every day | ORAL | Status: DC
Start: 2014-08-28 — End: 2014-09-02
  Administered 2014-08-28 – 2014-09-01 (×5): 30 mg via ORAL
  Filled 2014-08-28 (×6): qty 1

## 2014-08-28 MED ORDER — IPRATROPIUM BROMIDE 0.02 % IN SOLN: 500.0000 ug | Freq: Four times a day (QID) | RESPIRATORY_TRACT | Status: DC | PRN

## 2014-08-28 MED ORDER — GABAPENTIN 300 MG PO CAPS
300.0000 mg | ORAL_CAPSULE | Freq: Two times a day (BID) | ORAL | Status: DC
Start: 2014-08-28 — End: 2014-09-02
  Administered 2014-08-28 – 2014-09-02 (×9): 300 mg via ORAL
  Filled 2014-08-28 (×11): qty 1

## 2014-08-28 NOTE — Consult Note (Signed)
PHARMACY CONSULT NOTE - INITIAL  Pharmacy Consult for :   Vancomycin, Zosyn Indication:  R-foot ulcer, cellulitis, Rule out Osteomyelitis  Hospital Problems: Active Problems:   HYPOTHYROIDISM   COPD   Chronic pain   Chronic kidney disease, stage III (moderate)   Leg swelling   Atherosclerosis of native arteries of the extremities with ulceration(440.23)   Foot ulcer   Cellulitis   PAD (peripheral artery disease)   Infected ulcer of skin   Allergies: No Known Allergies  Patient Measurements: Height: 5' 10.87" (180 cm) Weight: 244 lb 0.8 oz (110.7 kg) IBW/kg (Calculated) : 74.99  Vital Signs: BP 182/81  Pulse 102  Temp(Src) 98.2 F (36.8 C) (Oral)  Resp 16  Ht 5' 10.87" (1.8 m)  Wt 244 lb 0.8 oz (110.7 kg)  BMI 34.17 kg/m2  SpO2 96%  Labs: No results found for this basename: WBC, HGB, PLT, LABCREA, CREATININE,  in the last 72 hours Estimated Creatinine Clearance: 47.1 ml/min (by C-G formula based on Cr of 1.5).   Microbiology: No results found for this or any previous visit (from the past 720 hour(s)).  Medical/Surgical History: Past Medical History  Diagnosis Date  . Hyperlipidemia   . COPD (chronic obstructive pulmonary disease)   . Leg pain   . GERD (gastroesophageal reflux disease)   . Depression   . Dizziness   . Productive cough   . Thyroid disease   . Peripheral vascular disease   . Chronic kidney disease   . ED (erectile dysfunction)   . Hypertension     08/17/12 - wife denies   Past Surgical History  Procedure Laterality Date  . Renal artery stent      left  . Kidney stone surgery  1989  . Lower extremity angiogram Left 03-20-2014    Dr. Tinnie Gens    Current Medication[s] Include: Medication PTA: Prescriptions prior to admission  Medication Sig Dispense Refill  . acetaminophen (TYLENOL) 325 MG tablet Take 650 mg by mouth every 6 (six) hours as needed. For pain or fever      . albuterol (PROVENTIL) (5 MG/ML) 0.5% nebulizer solution  Take 2.5 mg by nebulization every 6 (six) hours as needed. For shortness of breath      . aspirin EC 81 MG tablet Take 81 mg by mouth every morning.       . carbidopa-levodopa (SINEMET) 10-100 MG per tablet Take 1 tablet by mouth every evening.       . clonazePAM (KLONOPIN) 0.5 MG tablet Take 0.5 mg by mouth 2 (two) times daily as needed for anxiety. Patient uses 0.5 mg in the morning, and 1 mg in the evening.      . feeding supplement (ENSURE) PUDG Take 1 Container by mouth 3 (three) times daily between meals.      . fentaNYL (DURAGESIC - DOSED MCG/HR) 25 MCG/HR patch Place 25 mcg onto the skin every 3 (three) days.      Marland Kitchen gabapentin (NEURONTIN) 300 MG capsule Take 300 mg by mouth 2 (two) times daily.       Marland Kitchen HYDROcodone-acetaminophen (NORCO/VICODIN) 5-325 MG per tablet Take 1 tablet by mouth every 6 (six) hours as needed. For pain      . ipratropium (ATROVENT) 0.02 % nebulizer solution Take 500 mcg by nebulization every 6 (six) hours as needed. For shortness of breath      . levothyroxine (SYNTHROID, LEVOTHROID) 112 MCG tablet Take 112 mcg by mouth every morning.       Marland Kitchen  Melatonin 3 MG CAPS Take 3 mg by mouth every evening.       . mirtazapine (REMERON) 30 MG tablet Take 30 mg by mouth at bedtime.       Marland Kitchen QUEtiapine (SEROQUEL) 25 MG tablet Take 25 mg by mouth at bedtime.       . sertraline (ZOLOFT) 100 MG tablet Take 100 mg by mouth every evening.       . simvastatin (ZOCOR) 40 MG tablet Take 40 mg by mouth at bedtime.          Scheduled:  Scheduled:  . [START ON 08/29/2014] aspirin EC  81 mg Oral q morning - 10a  . carbidopa-levodopa  1 tablet Oral QPM  . enoxaparin (LOVENOX) injection  30 mg Subcutaneous Q24H  . feeding supplement (ENSURE)  1 Container Oral TID BM  . fentaNYL  25 mcg Transdermal Q72H  . furosemide  20 mg Intravenous Daily  . gabapentin  300 mg Oral BID  . [START ON 08/29/2014] levothyroxine  112 mcg Oral q morning - 10a  . mirtazapine  30 mg Oral QHS  . QUEtiapine  25  mg Oral QHS  . senna  1 tablet Oral BID  . sertraline  100 mg Oral QPM  . simvastatin  40 mg Oral QHS  . vancomycin  1,000 mg Intravenous Q24H    Infusion[s]: Infusions:  . sodium chloride      Antibiotic[s]: Anti-infectives   Start     Dose/Rate Route Frequency Ordered Stop   08/28/14 1830  piperacillin-tazobactam (ZOSYN) IVPB 3.375 g  Status:  Discontinued     3.375 g 100 mL/hr over 30 Minutes Intravenous 4 times per day 08/28/14 1821 08/28/14 1841   08/28/14 1830  vancomycin (VANCOCIN) IVPB 1000 mg/200 mL premix     1,000 mg 200 mL/hr over 60 Minutes Intravenous Every 24 hours 08/28/14 1821        Assessment:  57 Male c/PMH including chronic L Foot Arterial Ulcer, chronic PAD, and CRI who is admittted for R-foot ulcer, cellulitis, and rule out osteomyelitis.  Patient to be started on Vancomycin and Zosyn.  Estimated CrCl ~ 45 ml/min, Scr 1.5.    Goal of Therapy:   Will begin with higher Vancomycin trough goal of 15 - 20 mcg/ml pending rule-out of osteomyelitis. Zosyn selected for broadening antibiotic coverage and adjusted, if needed, for renal function.   Plan:  1. Change Vancomycin to 1500 mg IV q 48 hours. 2. Change Zosyn to 3.375 gm IV q 8 hours, each dose to infuse over 4 hours as per [facility] ID policy. 3. Follow up SCr, UOP, cultures, Vancomycin Levels, clinical course and adjust as clinically indicated.  Eliora Nienhuis, Craig Guess,  Pharm.D,    9/10/20156:40 PM

## 2014-08-28 NOTE — H&P (Signed)
Vital Signs  Entered weight:  249  lbs., Calculated Weight: 249 lbs., ( 112.95 kg) Height: 70 in., ( 177.80 cm) Temperature: 99.3 deg F, Temperature site: oral Pulse rate: 100 Pulse rhythm: regular  Blood Pressure #1: 160 / 84 mm Hg    BMI: 35.73 BSA: 2.29 Wt Chg: 5.50 lbs since 07/14/2014  Vitals entered by: Prudencio Pair, CMA on August 28, 2014 4:35 PM           Risk Factors:   Smoked Tobacco Use:  Former smoker    Cigarettes:  Yes -- 1/2  pack(s) per day,      Year quit:  08/19/12 Smokeless Tobacco Use:  Never    Counseled :  No Passive smoke exposure:  yes Drug use:  no HIV high-risk behavior:  no Caffeine use:  2 two times a week drinks per day Alcohol use:  no Exercise:  no Seatbelt use:  100 % Sun Exposure:  occasionally  Previous Tobacco Use: Signed On - 07/14/2014 Smoked Tobacco Use:  Former smoker    Cigarettes:  Yes -- 1/2  pack(s) per day,      Year quit:  08/19/12 Smokeless Tobacco Use:  Never    Counseled :  No Passive smoke exposure:  yes Drug use:  no HIV high-risk behavior:  no Caffeine use:  2 two times a week drinks per day  Previous Alcohol Use: Signed On - 07/14/2014 Alcohol use:  no Exercise:  no Seatbelt use:  100 % Sun Exposure:  occasionally  Colonoscopy History:    Date of Last Colonoscopy:  11/12/2013  History of Present Illness  History from: patient Reason for visit: See chief complaint Chief Complaint: Patient's right lower leg has cellulitis, sores on it and it's infected. His right foot and lower leg is in a lot of pain.  History of Present Illness: 80 Male c MMP including chronic L Foot Arterial Ulcer in pt c chronic PAD,  Peripheral neuropathy,  COPD--Smoker,  GERD complicated by peptic stricture/ esophageal diverticula, Hyperlipidemia, Hypothyroidism, HTN, Depression, HOH, CRI/CKD, RLS, ED, Sleep apnea,  Sacral Decub being admitted for R foot Ulcer and cellulitis..  Followed routinely by Dr Donnetta Hutching, saw him 3 wks ago and  has f/u later this month, but past 2 wks has had progressive, pain, redness, drainage, and bad smell.  Wife is dressing because drainage is messing up everything.  Pain is intolerable, even w/Fentanyl and Vicodin He doesn't feel he can wait to see Dr Donnetta Hutching in 2 more weeks  Allergy:EES   Review of Systems  General:       Complains of fevers, fatigue.        Denies chills, sweats, anorexia, malaise, sleep disturbance.   Eyes:       Denies blurring, irritation, discharge.   Ears/Nose/Throat:       Denies earache, decreased hearing, nasal congestion, nosebleeds, sore throat.   Cardiovascular:       Denies chest pains.   Respiratory:       Denies cough.   Gastrointestinal:       Denies nausea, vomiting, diarrhea, constipation, abdominal pain, melena, hematochezia.   Genitourinary:       Denies urinary frequency, urinary hesitancy, nocturia, incontinence.   Musculoskeletal:       Complains of joint pain, stiffness.   Skin:       Denies rash, itching, dryness.   Neurologic:       Complains of gait instability.        Denies weakness.  Endocrine:       Denies polydipsia, polyphagia, polyuria.   Heme/Lymphatic:       Denies abnormal bruising, bleeding, enlarged lymph nodes.    Past History Past Medical History (reviewed - no changes required): L Foot Arterial Ulcer.  Migraine HA,  Positive Syphilis test (congenital) s/p PCN,  Rheumatic fever,  PAD-RAS s/p PCI 7/06,  Peripheral neuropathy,  COPD--Smoker,  GERD complicated by peptic stricture/ esophageal diverticula, Hyperlipidemia, Hypothyroidism, HTN, Depression, HOH, CRI/CKD, RLS, ED, Rosacea, Sq Cell Ca of L Ear--Dr Delman Cheadle.  Nephrolithiasis, sleep apnea, PAD,  02/2008 MMSE 29/30.  Sacral Decub 05/2011 Moderate PAD noted. ABI .6  05/2009 PFTs FVC 2.47L, FEV1 1.53L, FEV1/FVC 86%  20-40% improvement post Bonchodilators. LE Doppler 03/31/09 (-) DVT but Deep venous reflux is noted July 24, 2007 Dr Henrene Pastor eval regarding dysphagia, weight loss  and an abnormal CT scan (circumferential esophageal wall thickening as well as retained contrast in the esophagus, mild adenopathy and gastric wall   thickening with luminal narrowing, changes in the right lung consistent with aspiration pneumonia, gallstones, and a small umbilical hernia) -- Upper Endo = Strictures/ Diverticulum/ HH/ Esoph Dysmotiliy and S/P Dilitation. 06/2010 CXR  Emphysematous changes and bibasilar atelectasis without acute findings 7/6/6 Inferior left renal artery critical 80-90 percent ostial stenosis with significant systolic and mean pressure gradients.  Post-stenting, the gradient was reduced to zero with a 5 mm x 15 mm Palmaz Blue stent. Widely patent left superior renal artery and right single main renal artery. 05/2005 MRA showed:  1.   Two left renal arteries.  The more anterior left renal artery is patent, however, the more posterior left renal artery has a focal osteal flow gap consistent with a short segment occlusion versus critical stenosis.    2.   Patent right renal artery.  3.   Significant calcific aortoiliac disease without stenosis, dissection or occlusion.  4.   Proximal celiac mild to moderate stenosis.  However, the superior mesenteric and inferior arteries are patent. Asp PNA event 08/2012 Surgical History (reviewed - no changes required): Right eye cataract  blepharochalasis  left sides> right sides, causing obstruction of his superior visual field areas as well as laterally.  Upper lid blepharoplasties bilateral surgery 10/04/10 Dr Towanda Malkin Family History (reviewed - no changes required): Pt's father is deceased and had DM and was an alcoholic.  Mother is deceased and had an AAA.  Brother has DM, CAD, Dementia and PD Social History (reviewed - no changes required): Pt is married and has 3 children and one grandchild 1 GGC.  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  Pt smokes one ppd off and on since 15 years.    Family History Summary:     Reviewed history  Last on 07/14/2014 and no changes required:08/28/2014 Father Ailene Ravel.) - Has Family History of Diabetes - Entered On: 06/17/2013  General Comments - FH: Pt's father is deceased and had DM and was an alcoholic.  Mother is deceased and had an AAA.  Brother has DM, CAD, Dementia and PD  Social History:    Reviewed history from 05/06/2013 and no changes required:       Pt is married and has 3 children and one grandchild 1 GGC.  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  Pt smokes one ppd off and on since 15 years.     Physical Exam  General appearance: well nourished, well hydrated, no acute distress.   Eyes  External: conjunctivae and lids normal  Ears, Nose  and Throat  Nasal: mucosa, septum, and turbinates normal Pharynx: tongue normal, protrudes mid line,  posterior pharynx without erythema or exudate  Neck  Neck: supple, no masses, trachea midline  Respiratory  Respiratory effort: no intercostal retractions or use of accessory muscles Auscultation: no rales, rhonchi, or wheezes  Cardiovascular  Auscultation: S1, S2, no murmur, rub, or gallop Periph. circulation: diminished, bilateral edema with sig erythema, pitting with multiple skin superficial lesions, post R heel open and foul smelling drainage  Gastrointestinal  Abdomen: soft, non-tender, no masses, bowel sounds normal. Obesity  Musculoskeletal  Gait and station: diff standing/walking RLE: R foot with multiple lesions, LE edema and drainage post heel.  Erythema and warmth  Skin  Inspection: as with R LE stated Speech: decreased  Mental Status Exam  Orientation: oriented to time, place, and person   Impression & Recommendations:  Problem # 1:  Cellulitis, leg, right (ICD-682.6) (ICD10-L03.115) Assessment: Deteriorated Aggrivated by LE edema, vascular insuff He has been bandaging at home, with drainage , fever and foul smelling needs IV abx, vascular consult  eval to r/o Osteomyeolitis Admit. We discussed  possibility of Ortho surgery/Amputation/BKA etc/R LE Arteriogram. IV Zosyn/Vanco Pain control  Problem # 2:  FOOT ULCER, LEFT (ICD-707.10) (ICD10-L97.529) Has R foot Ulcer and cellulitis this time as well Wound care nursing consult  Problem # 3:  CHRONIC KIDNEY DISEASE STAGE III (MODERATE) (ICD-585.3) (ICD10-N18.3) Assessment: Unchanged creat 1.7 1.6 -1.8 ober time c Cr Cl @ 42  Problem # 4:  PVD with claudication (ICD-443.89) (ICD10-I73.9) Assessment: Deteriorated Followed by [facility]/Dr Early. Last seen by him 07/29/14 ABI's 03/26/14: Right:  0.64 Left:  0.56  From 8/11 Note "80 y.o. male who has known lower extremity arterial insufficiency with SFA occlusive disease with mixed venous insufficiency who also has a hx of renal insufficiency. -pt does have a wound on his right heel, which is most likely a product of a thick callus that has been removed.   Given that the pt does have some renal insufficiency, would like to hold off on an arteriogram at this time.  Dr. Donnetta Hutching feels that this will heal on its own and given that he wound on his left foot healed, this one should heal as well since the ABI on the right is better than the left.   -pt knows that if this worsens or if he develops redness around the heel or the foot, he needs to present sooner for an arteriogram. -we will see him back in one month for a wound check to make sure he is healing the wound."      Problem # 5:  HYPERTENSION (ICD-401.9) (ICD10-I10) Assessment: Unchanged  His updated medication list for this problem includes:    Torsemide 10 Mg Oral Tabs (Torsemide) ..... One po daily    Propranolol Hcl 20 Mg Tabs (Propranolol hcl) .Marland Kitchen... 2-3 po qd prn  BP today: 160/84 - from pain? Prior BP: 126/80 (07/14/2014)  Labs Reviewed: Creat: 1.7 (03/14/2014) Chol: 121 (03/14/2014)   HDL: 34 (03/31/2009)   LDL: -1 (03/14/2014)      Problem # 6:  COPD (ICD-496) (VEL38-B01.9) Assessment: Unchanged Known COPD - Quit Tob  08/2012 O2 and Nebs.  Problem # 7:  NEUROPATHY (ICD-355.9) (ICD10-G62.9) Assessment: Unchanged B numb feet - Gabapentin  Problem # 8:  HYPOTHYROIDISM (ICD-244.9) (ICD10-E03.9) Assessment: Unchanged  His updated medication list for this problem includes:    Synthroid 112 Mcg Tabs (Levothyroxine sodium) .Marland Kitchen... Take one tablet daily Take Medications on empty stomach  as directed. TSH: 1.34 (03/14/2014 12:09:00 PM) Free T4:  90 (03/14/2014 12:09:00 PM) T3:     Problem # 9:  Chronic pain (ICD-338.29) (ICD10-G89.4) Assessment: Deteriorated worsened by infection  Problem # 10:  SLEEP APNEA, OBSTRUCTIVE (ICD-327.23) (ICD10-G47.33)  sleep apnea Had sleep study 16 yrs ago. For the RLS = Sinemet. Not on CPAP. Snores Sleep w head of bed elevated   Complete Medication List: 1)  Cvs Jock Itch 1 % Ext Crea (Terbinafine hcl) .... Apply bid generously to affected area 2)  Torsemide 10 Mg Oral Tabs (Torsemide) .... One po daily 3)  Fentanyl 25 Mcg/hr Pt72 (Fentanyl) .... Apply 1 patch and change q 72 hours 4)  Santyl 250 Unit/gm Oint (Collagenase) .... Use santyl to wound qod 5)  Cyclobenzaprine Hcl 5 Mg Tabs (Cyclobenzaprine hcl) .... One twice a day for muscle relaxation, caution on sedation with other meds 6)  Hydrocodone-acetaminophen 5-325 Mg Tabs (Hydrocodone-acetaminophen) .Marland Kitchen.. 1 po q 6 hours prn pain 7)  Desowen Oint W/cetaphil Lot 0.05 % Kit (Desonide oint-moisturizing lot) .... 2x a day 8)  Hydroxyzine Hcl 25 Mg Tabs (Hydroxyzine hcl) .... 1/2 tab tid prn itch 9)  Gabapentin 300 Mg Caps (Gabapentin) .... Take 1 tablet by mouth 3 times a day 10)  Propranolol Hcl 20 Mg Tabs (Propranolol hcl) .... 2-3 po qd prn 11)  Carbidopa-levodopa 10-100 Mg Tabs (Carbidopa-levodopa) .... Take one tablet before bedtime 12)  Clonazepam 1 Mg Tabs (Clonazepam) .... Take 1/2 tablet in the morning, 1/2 at lunch and 2  tablets in the evening 13)  Remeron 30 Mg Tabs (Mirtazapine) .... Take one tablet  before bedtime 14)  Seroquel 50 Mg Tabs (Quetiapine fumarate) .Marland Kitchen.. 1 po qhs 15)  Simvastatin 40 Mg Tabs (Simvastatin) .... Take one tablet at bedtime 16)  Synthroid 112 Mcg Tabs (Levothyroxine sodium) .... Take one tablet daily 17)  Sertraline Hcl 100 Mg Tabs (Sertraline hcl) .Marland Kitchen.. 1 po qd 18)  Aspirin 81 Mg Ec Tab (Aspirin) .... Take one (1) tablet by mouth daily 19)  Melatonin 3 Mg Caps (Melatonin) .Marland Kitchen.. 1 capsule by mouth at night as needed 20)  Advair Diskus 250-50 Mcg/dose Aepb (Fluticasone-salmeterol) .... One inhalation bid 21)  Viagra 100 Mg Tabs (Sildenafil citrate) .... One po daily prn    Patient Goals/Instructions: ADMIT. consult [facility]. May need Angiogram. Labs c Sed rate and CRP. X ray R Heel. May need Debridement. Abx. Pain control Diurese. Wound Care  Electronically signed by [name] MD on 08/28/2014 at 5:59 PM

## 2014-08-28 NOTE — Progress Notes (Signed)
Dressing changed on right heel wet to dry per MD order.  Bilateral lower extremities edema and discoloration.  Right heel with 1.9 x  2 cm circular ulcer.

## 2014-08-29 DIAGNOSIS — I70269 Atherosclerosis of native arteries of extremities with gangrene, unspecified extremity: Secondary | ICD-10-CM

## 2014-08-29 LAB — CBC
HCT: 33.8 % — ABNORMAL LOW (ref 39.0–52.0)
HEMOGLOBIN: 11.1 g/dL — AB (ref 13.0–17.0)
MCH: 27.9 pg (ref 26.0–34.0)
MCHC: 32.8 g/dL (ref 30.0–36.0)
MCV: 84.9 fL (ref 78.0–100.0)
PLATELETS: 146 10*3/uL — AB (ref 150–400)
RBC: 3.98 MIL/uL — ABNORMAL LOW (ref 4.22–5.81)
RDW: 14.3 % (ref 11.5–15.5)
WBC: 7.9 10*3/uL (ref 4.0–10.5)

## 2014-08-29 LAB — BASIC METABOLIC PANEL
Anion gap: 12 (ref 5–15)
BUN: 17 mg/dL (ref 6–23)
CHLORIDE: 100 meq/L (ref 96–112)
CO2: 28 meq/L (ref 19–32)
Calcium: 8.2 mg/dL — ABNORMAL LOW (ref 8.4–10.5)
Creatinine, Ser: 1.63 mg/dL — ABNORMAL HIGH (ref 0.50–1.35)
GFR calc Af Amer: 43 mL/min — ABNORMAL LOW (ref >90)
GFR calc non Af Amer: 37 mL/min — ABNORMAL LOW (ref >90)
GLUCOSE: 109 mg/dL — AB (ref 70–99)
Potassium: 3.7 mEq/L (ref 3.7–5.3)
Sodium: 140 mEq/L (ref 137–147)

## 2014-08-29 LAB — GLUCOSE, CAPILLARY
GLUCOSE-CAPILLARY: 120 mg/dL — AB (ref 70–99)
Glucose-Capillary: 117 mg/dL — ABNORMAL HIGH (ref 70–99)

## 2014-08-29 LAB — HEMOGLOBIN A1C
Hgb A1c MFr Bld: 6.2 % — ABNORMAL HIGH (ref <5.7)
Mean Plasma Glucose: 131 mg/dL — ABNORMAL HIGH (ref <117)

## 2014-08-29 LAB — C-REACTIVE PROTEIN: CRP: 7 mg/dL — ABNORMAL HIGH (ref <0.60)

## 2014-08-29 MED ORDER — MUPIROCIN CALCIUM 2 % EX CREA
TOPICAL_CREAM | Freq: Every day | CUTANEOUS | Status: DC
  Administered 2014-08-29 – 2014-09-01 (×4): via TOPICAL
  Filled 2014-08-29 (×2): qty 15

## 2014-08-29 MED ORDER — MORPHINE SULFATE 2 MG/ML IJ SOLN
1.0000 mg | INTRAMUSCULAR | Status: DC | PRN
  Administered 2014-08-29 – 2014-09-02 (×15): 1 mg via INTRAVENOUS
  Filled 2014-08-29 (×15): qty 1

## 2014-08-29 MED ORDER — MOMETASONE FURO-FORMOTEROL FUM 100-5 MCG/ACT IN AERO
2.0000 | INHALATION_SPRAY | Freq: Two times a day (BID) | RESPIRATORY_TRACT | Status: DC
  Administered 2014-08-29 – 2014-09-02 (×8): 2 via RESPIRATORY_TRACT
  Filled 2014-08-29: qty 8.8

## 2014-08-29 MED ORDER — ENOXAPARIN SODIUM 60 MG/0.6ML ~~LOC~~ SOLN
55.0000 mg | SUBCUTANEOUS | Status: DC
  Administered 2014-08-29 – 2014-09-01 (×4): 55 mg via SUBCUTANEOUS
  Filled 2014-08-29 (×5): qty 0.6

## 2014-08-29 MED ORDER — ENSURE COMPLETE PO LIQD
237.0000 mL | Freq: Two times a day (BID) | ORAL | Status: DC
  Administered 2014-08-29 – 2014-09-02 (×7): 237 mL via ORAL
  Filled 2014-08-29 (×5): qty 237

## 2014-08-29 MED ORDER — INSULIN ASPART 100 UNIT/ML ~~LOC~~ SOLN
0.0000 [IU] | Freq: Two times a day (BID) | SUBCUTANEOUS | Status: DC
  Administered 2014-08-30: 1 [IU] via SUBCUTANEOUS
  Administered 2014-08-30 – 2014-08-31 (×2): 2 [IU] via SUBCUTANEOUS
  Administered 2014-08-31: 1 [IU] via SUBCUTANEOUS

## 2014-08-29 NOTE — Consult Note (Signed)
[facility] [place]  Reason for Consult:  Non healing wound of right heel Referring Physician:  Virgina Jock MRN #:  [id number]  History of Present Illness: This is a 80 y.o. male with known lower extremity arterial insufficiency, left SFA occlusion and CKD stage III. He is well known to Dr. Donnetta Hutching and was last seen in the [facility] office on 07/29/14 for follow up of his right foot ulcer. He was told to continue local wound management and compression and to follow up in one month. His ulcer has not improved and he reports foul smelling drainage. If the ulcer did not improve with conservative management, an arteriogram would be warranted for further evaluation. He was admitted and started on IV antibiotics.   Of note, his last arteriogram was on 03/20/13 to evaluate his left leg due to an left foot wound. His left foot wound has since healed with conservative management.  On ROS, he admits fever and chills. He also notes intermittent numbness of his feet. He denies any intermittent claudication but is mostly sedentary. He has had chronic redness and lesions of his lower legs for the past two years. He denies any chest pain, shortness of breath and weakness of the arms and legs.   He has hypertension managed not on antihypertensive meds. He has hyperlipidemia on a statin. He is on a daily aspirin. He is not diabetic.   Past Medical History  Diagnosis Date  . Hyperlipidemia   . COPD (chronic obstructive pulmonary disease)   . Leg pain   . GERD (gastroesophageal reflux disease)   . Depression   . Dizziness   . Productive cough   . Thyroid disease   . Peripheral vascular disease   . ED (erectile dysfunction)   . Hypertension     08/17/12 - wife denies  . Walking pneumonia 2000's  . H/O hiatal hernia   . Migraine     "when I was a young boy"  . Arthritis     "hands" (08/28/2014)  . Anxiety   . Hypothyroidism   . Chronic kidney disease (CKD), stage III (moderate)     [name]  08/28/2014   Past Surgical History  Procedure Laterality Date  . Renal artery stent Left   . Kidney stone surgery  1989  . Lower extremity angiogram Left 03-20-2014    Dr. Tinnie Gens  . Tonsillectomy    . Cataract extraction, bilateral Bilateral   . Blepharoplasty Right     No Known Allergies  Prior to Admission medications   Medication Sig Start Date End Date Taking? Authorizing Provider  acetaminophen (TYLENOL) 325 MG tablet Take 650 mg by mouth every 6 (six) hours as needed. For pain or fever   Yes Historical Provider, MD  albuterol (PROVENTIL) (5 MG/ML) 0.5% nebulizer solution Take 2.5 mg by nebulization every 6 (six) hours as needed. For shortness of breath   Yes Historical Provider, MD  aspirin EC 81 MG tablet Take 81 mg by mouth every morning.    Yes Historical Provider, MD  carbidopa-levodopa (SINEMET) 10-100 MG per tablet Take 1 tablet by mouth every evening.    Yes Historical Provider, MD  clonazePAM (KLONOPIN) 0.5 MG tablet Take 0.5-1 mg by mouth 2 (two) times daily as needed for anxiety (0.5mg  in the morning and 1mg  at night). Patient uses 0.5 mg in the morning, and 1 mg in the evening.   Yes Historical Provider, MD  feeding supplement (ENSURE) PUDG Take 1 Container by mouth 3 (three)  times daily between meals. 08/23/12  Yes [name], MD  fentaNYL (DURAGESIC - DOSED MCG/HR) 25 MCG/HR patch Place 25 mcg onto the skin every 3 (three) days.   Yes Historical Provider, MD  gabapentin (NEURONTIN) 300 MG capsule Take 300 mg by mouth 2 (two) times daily.    Yes Historical Provider, MD  HYDROcodone-acetaminophen (NORCO/VICODIN) 5-325 MG per tablet Take 1 tablet by mouth every 6 (six) hours as needed. For pain   Yes Historical Provider, MD  ipratropium (ATROVENT) 0.02 % nebulizer solution Take 500 mcg by nebulization every 6 (six) hours as needed. For shortness of breath   Yes Historical Provider, MD  levothyroxine (SYNTHROID, LEVOTHROID) 112 MCG tablet Take 112 mcg by mouth every  morning.    Yes Historical Provider, MD  Melatonin 3 MG CAPS Take 3 mg by mouth every evening.    Yes Historical Provider, MD  mirtazapine (REMERON) 30 MG tablet Take 30 mg by mouth at bedtime.    Yes Historical Provider, MD  QUEtiapine (SEROQUEL) 25 MG tablet Take 50 mg by mouth at bedtime.    Yes Historical Provider, MD  sertraline (ZOLOFT) 100 MG tablet Take 100 mg by mouth every evening.    Yes Historical Provider, MD  simvastatin (ZOCOR) 40 MG tablet Take 40 mg by mouth at bedtime.    Yes Historical Provider, MD    History   Social History  . Marital Status: Married    Spouse Name: N/A    Number of Children: N/A  . Years of Education: N/A   Occupational History  . Not on file.   Social History Main Topics  . Smoking status: Former Smoker -- 1.00 packs/day for 25 years    Types: Cigarettes    Quit date: 08/17/2012  . Smokeless tobacco: Never Used  . Alcohol Use: Yes     Comment: 08/28/2014 "last drink was in ~ 2012"  . Drug Use: No  . Sexual Activity: Not on file   Other Topics Concern  . Not on file   Social History Narrative  . No narrative on file   Family History  Problem Relation Age of Onset  . Other Mother     AAA  . AAA (abdominal aortic aneurysm) Mother   . Heart attack Father   . Alcohol abuse Father   . Diabetes Father   . Other Father     amputation  . Diabetes Brother   . Coronary artery disease Brother   . Dementia Brother     ROS: [x]  Positive   [ ]  Negative   [ ]  All sytems reviewed and are negative  Cardiovascular: []  chest pain/pressure []  palpitations []  SOB lying flat []  DOE []  pain in legs while walking []  pain in legs at rest []  pain in legs at night [x]  non-healing ulcers []  hx of DVT [x]  swelling in legs  Pulmonary: []  productive cough []  asthma/wheezing []  home O2  Neurologic: []  weakness in []  arms []  legs [x]  numbness in []  arms []  feet []  hx of CVA []  mini stroke [] difficulty speaking or slurred speech []   temporary loss of vision in one eye []  dizziness  Hematologic: []  hx of cancer []  bleeding problems []  problems with blood clotting easily  Endocrine:   []  diabetes []  thyroid disease  GI []  vomiting blood []  blood in stool  GU: []  CKD/renal failure []  HD--[]  M/W/F or []  T/T/S []  burning with urination []  blood in urine  Psychiatric: []  anxiety []  depression  Musculoskeletal: []  arthritis []  joint pain  Integumentary: []  rashes [x]  ulcers  Constitutional: [x]  fever [x]  chills   Physical Examination  Filed Vitals:   08/29/14 0415  BP: 181/80  Pulse: 88  Temp: 97.9 F (36.6 C)  Resp: 18   Body mass index is 34.17 kg/(m^2).  General:  WD obese male in NAD Gait: Not observed HENT: WNL, normocephalic Pulmonary: normal non-labored breathing, fine crackles at bases Cardiac: regular, without  Murmurs, rubs or gallops; without carotid bruits Abdomen: soft, NT/ND, no masses Skin: with erythema and excoriations of lower shins bilaterally. Right plantar heel ulcer. No drainage or odor noted.  Vascular Exam/Pulses:  Right Left  Radial 2+ (normal) 2+ (normal)  Ulnar 2+ (normal) 2+ (normal)  Femoral 2+ (normal) 2+ (normal)  Popliteal Non palpable Non palpable  DP Non palpable, + doppler signal Non palpable, + doppler signal  PT Non palpable, + doppler signal Non palpable, + doppler signal   Extremities: bilateral lower leg edema, right > left. No ischemic changes.  Musculoskeletal: no muscle wasting or atrophy  Neurologic: A&O X 3; Appropriate Affect ; SENSATION: normal; MOTOR FUNCTION:  moving all extremities equally. Speech is fluent/normal   CBC    Component Value Date/Time   WBC 7.9 08/29/2014 0512   RBC 3.98* 08/29/2014 0512   HGB 11.1* 08/29/2014 0512   HCT 33.8* 08/29/2014 0512   PLT 146* 08/29/2014 0512   MCV 84.9 08/29/2014 0512   MCH 27.9 08/29/2014 0512   MCHC 32.8 08/29/2014 0512   RDW 14.3 08/29/2014 0512   LYMPHSABS 1.0 08/17/2012 1526   MONOABS  0.8 08/17/2012 1526   EOSABS 0.0 08/17/2012 1526   BASOSABS 0.0 08/17/2012 1526    BMET    Component Value Date/Time   NA 140 08/29/2014 0512   K 3.7 08/29/2014 0512   CL 100 08/29/2014 0512   CO2 28 08/29/2014 0512   GLUCOSE 109* 08/29/2014 0512   BUN 17 08/29/2014 0512   CREATININE 1.63* 08/29/2014 0512   CALCIUM 8.2* 08/29/2014 0512   GFRNONAA 37* 08/29/2014 0512   GFRAA 43* 08/29/2014 0512    COAGS: Lab Results  Component Value Date   INR 1.18 08/28/2014   INR 1.07 01/03/2013    ASSESSMENT: This is a 80 y.o. male with right foot ulcer and known lower extremity arterial insufficiency, left SFA occlusion and CKD stage III.  PLAN: -He has doppler signals bilaterally and palpable femoral pulses. Motor and sensory intact.  -Will plan on aortogram with bilateral runoff on Monday, 09/01/14 with Dr. Scot Dock.  -His creatinine is 1.6. Will rehydrate him on Sunday evening for procedure Monday.  -Continue antibiotics.     Virgina Jock, PA-C [facility] Office: [phone] Pager: [phone]  I have examined the patient, reviewed and agree with above. Difficult management problem. Patient has extensive lower extremity edema related to fluid overload. He does have a chronic wound on his right heel. This is not typical of a pressure sore. This is on the plantar aspect of his heel. Is approximately one and 1/2 cm in diameter. Does not appear to be extending to the bone. Had hoped for healing without revascularization. Will obtain arteriogram with limited contrast on Monday for further evaluation. We'll make recommendations about continued wound care versus revascularization depending on the arteriogram  Milliana Reddoch, MD 08/29/2014 3:39 PM

## 2014-08-29 NOTE — Progress Notes (Signed)
Chart and note reviewed. Agree with note.  Kimberly Harris, RD, LDN, CNSC Pager [phone] After Hours Pager [phone]   

## 2014-08-29 NOTE — Progress Notes (Signed)
Subjective: Admitted yesterday with very swollen RLE c R heel Ulcer/foul smelling drainage/pain and RLE cellulitis.  Subjective fevers at Home. Known PAD. On IV Abx Still in pain. "L kidney hurts as well from urinating"  Objective: Vital signs in last 24 hours: Temp:  [97.9 F (36.6 C)-98.7 F (37.1 C)] 97.9 F (36.6 C) (09/11 0415) Pulse Rate:  [88-102] 88 (09/11 0415) Resp:  [16-18] 18 (09/11 0415) BP: (138-182)/(62-81) 181/80 mmHg (09/11 0415) SpO2:  [96 %-98 %] 96 % (09/11 0415) Weight:  [110.7 kg (244 lb 0.8 oz)] 110.7 kg (244 lb 0.8 oz) (09/10 1825) Weight change:  Last BM Date: 08/28/14  CBG (last 3)  No results found for this basename: GLUCAP,  in the last 72 hours  Intake/Output from previous day:  Intake/Output Summary (Last 24 hours) at 08/29/14 0729 Last data filed at 08/29/14 0416  Gross per 24 hour  Intake    120 ml  Output      0 ml  Net    120 ml   09/10 0701 - 09/11 0700 In: 120 [P.O.:120] Out: -    Physical Exam General appearance: Elderly Eyes: no scleral icterus Throat: oropharynx moist without erythema Resp: distant at bases but clear. Cardio: Reg, m GI: Obese, soft, non-tender; bowel sounds normal; no masses,  no organomegaly Extremities: + B LE Edema. R heel 2 cm Superficial ulcer c surrounding callous. Lots of the malodor has improved and less edema than my office. Less drainage. Venous stasis noted. Less cellulitis. Wrapped   Lab Results:  Recent Labs  08/28/14 2020 08/29/14 0512  NA 138 140  K 3.8 3.7  CL 101 100  CO2 23 28  GLUCOSE 180* 109*  BUN 16 17  CREATININE 1.46* 1.63*  CALCIUM 8.2* 8.2*  MG 1.0*  --   PHOS 2.5  --      Recent Labs  08/28/14 2020  AST 15  ALT 14  ALKPHOS 80  BILITOT 0.7  PROT 6.4  ALBUMIN 2.9*     Recent Labs  08/28/14 2020 08/29/14 0512  WBC 9.0 7.9  HGB 11.4* 11.1*  HCT 34.7* 33.8*  MCV 85.0 84.9  PLT 135* 146*    Lab Results  Component Value Date   INR 1.18 08/28/2014    INR 1.07 01/03/2013    No results found for this basename: CKTOTAL, CKMB, CKMBINDEX, TROPONINI,  in the last 72 hours   Recent Labs  08/28/14 2020  TSH 1.410    No results found for this basename: VITAMINB12, FOLATE, FERRITIN, TIBC, IRON, RETICCTPCT,  in the last 72 hours  Micro Results: No results found for this or any previous visit (from the past 240 hour(s)).   Studies/Results: Dg Ankle 2 Views Right  08/28/2014   CLINICAL DATA:  Right ankle pain, erythema, and swelling.  EXAM: RIGHT ANKLE - 2 VIEW  COMPARISON:  None.  FINDINGS: Abnormal soft tissue swelling circumferentially around the ankle and lower calf.  2 portable views of the right ankle demonstrate considerable hindfoot and midfoot spurring along with suspected pes planus.  No talar dome or plafond defect is identified.  Vascular calcifications noted. An 8 mm lucency along the underside of the calcaneus is probably incidental and unlikely to represent a focus of osteomyelitis.  IMPRESSION: 1. There is some lucency in the inferior calcaneus which is probably incidental, and unlikely to be from osteomyelitis. I suspect that this is more related to some adjacent posterior spurring. MRI is certainly a better weighted differentiate osteomyelitis  in the foot. 2. Soft tissue swelling in the ankle, lower calf, and dorsal foot diffusely. 3. Probable pes planus.   Electronically Signed   By: Sherryl Barters M.D.   On: 08/28/2014 18:55   Portable Chest 1 View  08/28/2014   CLINICAL DATA:  Shortness of breath, COPD  EXAM: PORTABLE CHEST - 1 VIEW  COMPARISON:  Portable exam 1829 hr compared to 08/21/2012  FINDINGS: Enlargement of cardiac silhouette.  Calcified tortuous aorta.  Pulmonary vascularity normal.  Bibasilar interstitial infiltrates, improved versus previous exam, could represent pulmonary fibrosis or persistent versus recurrent infection.  Upper lungs clear.  No gross pleural effusion or pneumothorax.  IMPRESSION: Enlargement of  cardiac silhouette.  Bibasilar interstitial infiltrates question persistent or recurrent infection versus fibrosis.   Electronically Signed   By: Lavonia Dana M.D.   On: 08/28/2014 18:51     Medications: Scheduled: . aspirin EC  81 mg Oral q morning - 10a  . carbidopa-levodopa  1 tablet Oral QPM  . enoxaparin (LOVENOX) injection  30 mg Subcutaneous Q24H  . feeding supplement (ENSURE)  1 Container Oral TID BM  . fentaNYL  25 mcg Transdermal Q72H  . furosemide      . furosemide  20 mg Intravenous Daily  . gabapentin  300 mg Oral BID  . Influenza vac split quadrivalent PF  0.5 mL Intramuscular Tomorrow-1000  . levothyroxine  112 mcg Oral QAC breakfast  . mirtazapine  30 mg Oral QHS  . piperacillin-tazobactam (ZOSYN)  IV  3.375 g Intravenous Q8H  . QUEtiapine  25 mg Oral QHS  . senna  1 tablet Oral BID  . sertraline  100 mg Oral QPM  . simvastatin  40 mg Oral QHS  . vancomycin (VANCOCIN) IVPB  1,500 mg Intravenous Q48H   Continuous: . sodium chloride 10 mL/hr at 08/28/14 2103     Assessment/Plan: Active Problems:   HYPOTHYROIDISM   COPD   Chronic pain   Chronic kidney disease, stage III (moderate)   Leg swelling   Atherosclerosis of native arteries of the extremities with ulceration(440.23)   Foot ulcer   Cellulitis   PAD (peripheral artery disease)   Infected ulcer of skin  R heel Ulcer/foul smelling drainage/pain and RLE cellulitis.  Subjective fevers at Home. Known PAD. On IV Abx On Narcs for pain control. Keeping weight off. Mild diuresis ordered. He is NPO in case vasc surgery wants to do LE Arterial Angiogram to define how bad his Vasc system is. We have discussed possible Osteomyelitis.  X ray (-) and of course MRI is a better test.  WBC is Negative and Sed rate is moderately elevated.  CRP is high.  I will defer to Vasc - they might want me to do an MRI to R/O Osteo prior to angiogram or they may want Ortho to see if superficial Debridement needs to be done first.   Will await their consult.  Continue Dressings and get Wound care consulted.  He is already some better than yesterday in my office.  The Abx may be helping and of course being in bed c legs elevated and wrapped may be limiting some of the venous stasis changes that may be accompanying this?  ABI's 03/26/14:  Right: 0.64  Left: 0.56   Low Albumin - Protein Malnutrition.  Mild Anemia - Hbg 11.1-11.4  Mild Thrombocytopenia - 135-146 is fine  DVT - Proph ordered  CBGs 109-180.  Check A1C and do CBGs BID  CKD3 - Stable  COPD -  Stable  HTN - Fine  RLS - Sinemet  ID -  Anti-infectives   Start     Dose/Rate Route Frequency Ordered Stop   08/28/14 2100  vancomycin (VANCOCIN) 1,500 mg in sodium chloride 0.9 % 500 mL IVPB     1,500 mg 250 mL/hr over 120 Minutes Intravenous Every 48 hours 08/28/14 1852     08/28/14 1845  piperacillin-tazobactam (ZOSYN) IVPB 3.375 g     3.375 g 12.5 mL/hr over 240 Minutes Intravenous Every 8 hours 08/28/14 1844     08/28/14 1830  piperacillin-tazobactam (ZOSYN) IVPB 3.375 g  Status:  Discontinued     3.375 g 100 mL/hr over 30 Minutes Intravenous 4 times per day 08/28/14 1821 08/28/14 1841   08/28/14 1830  vancomycin (VANCOCIN) IVPB 1000 mg/200 mL premix  Status:  Discontinued     1,000 mg 200 mL/hr over 60 Minutes Intravenous Every 24 hours 08/28/14 1821 08/28/14 1851        LOS: 1 day   Dayveon Halley M 08/29/2014, 7:29 AM

## 2014-08-29 NOTE — Progress Notes (Signed)
MEDICATION RELATED CONSULT NOTE - INITIAL   Pharmacy Consult for Lovenox Indication: VTE prophylaxis  No Known Allergies  Patient Measurements: Height: 5' 10.87" (180 cm) Weight: 244 lb 0.8 oz (110.7 kg) IBW/kg (Calculated) : 74.99  Vital Signs: Temp: 97.9 F (36.6 C) (09/11 1105) Temp src: Oral (09/11 1105) BP: 139/74 mmHg (09/11 1105) Pulse Rate: 62 (09/11 1105) Intake/Output from previous day: 09/10 0701 - 09/11 0700 In: 120 [P.O.:120] Out: -  Intake/Output from this shift:    Labs:  Recent Labs  08/28/14 2020 08/29/14 0512  WBC 9.0 7.9  HGB 11.4* 11.1*  HCT 34.7* 33.8*  PLT 135* 146*  CREATININE 1.46* 1.63*  MG 1.0*  --   PHOS 2.5  --   ALBUMIN 2.9*  --   PROT 6.4  --   AST 15  --   ALT 14  --   ALKPHOS 80  --   BILITOT 0.7  --    Estimated Creatinine Clearance: 43.4 ml/min (by C-G formula based on Cr of 1.63).   Microbiology: No results found for this or any previous visit (from the past 720 hour(s)).  Medical History: Past Medical History  Diagnosis Date  . Hyperlipidemia   . COPD (chronic obstructive pulmonary disease)   . Leg pain   . GERD (gastroesophageal reflux disease)   . Depression   . Dizziness   . Productive cough   . Thyroid disease   . Peripheral vascular disease   . ED (erectile dysfunction)   . Hypertension     08/17/12 - wife denies  . Walking pneumonia 2000's  . H/O hiatal hernia   . Migraine     "when I was a young boy"  . Arthritis     "hands" (08/28/2014)  . Anxiety   . Hypothyroidism   . Chronic kidney disease (CKD), stage III (moderate)     [name] 08/28/2014    Medications:  Scheduled:  . aspirin EC  81 mg Oral q morning - 10a  . carbidopa-levodopa  1 tablet Oral QPM  . enoxaparin (LOVENOX) injection  30 mg Subcutaneous Q24H  . feeding supplement (ENSURE COMPLETE)  237 mL Oral BID BM  . fentaNYL  25 mcg Transdermal Q72H  . furosemide  20 mg Intravenous Daily  . gabapentin  300 mg Oral BID  . Influenza vac  split quadrivalent PF  0.5 mL Intramuscular Tomorrow-1000  . insulin aspart  0-9 Units Subcutaneous BID WC  . levothyroxine  112 mcg Oral QAC breakfast  . mirtazapine  30 mg Oral QHS  . mometasone-formoterol  2 puff Inhalation BID  . mupirocin cream   Topical Daily  . piperacillin-tazobactam (ZOSYN)  IV  3.375 g Intravenous Q8H  . QUEtiapine  25 mg Oral QHS  . senna  1 tablet Oral BID  . sertraline  100 mg Oral QPM  . simvastatin  40 mg Oral QHS  . vancomycin (VANCOCIN) IVPB  1,500 mg Intravenous Q48H    Assessment: 80 yo M admitted with R foot ulcer and RLE cellulitis.  Pt was started on Lovenox for VTE prophylaxis.  Based on weight and renal function will adjust Lovenox dose accordingly.  Goal of Therapy:  Anti-Xa level 0.3-0.6 4 hours after dose given  Plan:  Change Lovenox to 55mg  SQ q24h. Continue to monitor CBC and s/sx bleeding.  [employer], Pharm.D., BCPS Clinical Pharmacist Pager [phone] 08/29/2014 2:51 PM

## 2014-08-29 NOTE — Progress Notes (Signed)
INITIAL NUTRITION ASSESSMENT  DOCUMENTATION CODES Per approved criteria  -Obesity Unspecified   INTERVENTION: Discontinue Ensure pudding.  Provide Ensure Complete po BID, each supplement provides 350 kcal and 13 grams of protein.  NUTRITION DIAGNOSIS: Increased nutrient needs related to heel ulcer as evidenced by estimated nutrition needs.   Goal: Pt to meet >/= 90% of their estimated nutrition needs   Monitor:  PO intake, weight trends, labs, I/O's  Reason for Assessment: MD consult for assessment of nutrition requirements/status  80 y.o. male  Admitting Dx: Infected ulcer of skin  ASSESSMENT: Pt with MMP including chronic L Foot Arterial Ulcer in pt c chronic PAD, Peripheral neuropathy, COPD--Smoker, GERD complicated by peptic stricture/ esophageal diverticula, Hyperlipidemia, Hypothyroidism, HTN, Depression, HOH, CRI/CKD, RLS, ED, Sleep apnea, Sacral Decub being admitted for R foot Ulcer and cellulitis.  Pt reports having a good appetite currently while hospitalized and also prior to admission at home. Pt reports eating 3 full meals a day at home in addition to drinking Boost 2-3 times a day. Pt reports no recent weight loss and that he actually has gained weight. Pt with an infected ulcer on heel. Pt reports he has been getting Ensure pudding, but would rather have the Ensure drink instead. Will modify orders. Pt was educated to consume more protein to help heal his ulcer. Pt expressed understanding.   Pt with observed no significant fat or muscle mass loss.  Labs: Low GFR. High creatinine.  Height: Ht Readings from Last 1 Encounters:  08/28/14 5' 10.87" (1.8 m)    Weight: Wt Readings from Last 1 Encounters:  08/28/14 244 lb 0.8 oz (110.7 kg)    Ideal Body Weight: 171 lbs  % Ideal Body Weight: 143%  Wt Readings from Last 10 Encounters:  08/28/14 244 lb 0.8 oz (110.7 kg)  07/29/14 244 lb (110.678 kg)  04/22/14 241 lb 4.8 oz (109.453 kg)  03/20/13 230 lb  (104.327 kg)  03/20/13 230 lb (104.327 kg)  02/05/13 234 lb (106.142 kg)  01/05/13 213 lb 6.4 oz (96.798 kg)  10/23/12 233 lb (105.688 kg)  08/17/12 212 lb (96.163 kg)  11/22/11 223 lb 11.2 oz (101.47 kg)    Usual Body Weight: 230 lbs  % Usual Body Weight: 106%  BMI:  Body mass index is 34.17 kg/(m^2). Class II obesity  Estimated Nutritional Needs: Kcal: 2100-2300 Protein: 110-120 grams Fluid: 2.1 - 2.3 L/day  Skin: R heel ulcer, RLE edema  Diet Order: General  EDUCATION NEEDS: -Education needs addressed   Intake/Output Summary (Last 24 hours) at 08/29/14 1227 Last data filed at 08/29/14 0416  Gross per 24 hour  Intake    120 ml  Output      0 ml  Net    120 ml    Last BM: 9/10  Labs:   Recent Labs Lab 08/28/14 2020 08/29/14 0512  NA 138 140  K 3.8 3.7  CL 101 100  CO2 23 28  BUN 16 17  CREATININE 1.46* 1.63*  CALCIUM 8.2* 8.2*  MG 1.0*  --   PHOS 2.5  --   GLUCOSE 180* 109*    CBG (last 3)   Recent Labs  08/29/14 0807  GLUCAP 120*    Scheduled Meds: . aspirin EC  81 mg Oral q morning - 10a  . carbidopa-levodopa  1 tablet Oral QPM  . enoxaparin (LOVENOX) injection  30 mg Subcutaneous Q24H  . feeding supplement (ENSURE)  1 Container Oral TID BM  . fentaNYL  25 mcg  Transdermal Q72H  . furosemide  20 mg Intravenous Daily  . gabapentin  300 mg Oral BID  . Influenza vac split quadrivalent PF  0.5 mL Intramuscular Tomorrow-1000  . insulin aspart  0-9 Units Subcutaneous BID WC  . levothyroxine  112 mcg Oral QAC breakfast  . mirtazapine  30 mg Oral QHS  . mometasone-formoterol  2 puff Inhalation BID  . mupirocin cream   Topical Daily  . piperacillin-tazobactam (ZOSYN)  IV  3.375 g Intravenous Q8H  . QUEtiapine  25 mg Oral QHS  . senna  1 tablet Oral BID  . sertraline  100 mg Oral QPM  . simvastatin  40 mg Oral QHS  . vancomycin (VANCOCIN) IVPB  1,500 mg Intravenous Q48H    Continuous Infusions: . sodium chloride 10 mL/hr at 08/28/14 2103     Past Medical History  Diagnosis Date  . Hyperlipidemia   . COPD (chronic obstructive pulmonary disease)   . Leg pain   . GERD (gastroesophageal reflux disease)   . Depression   . Dizziness   . Productive cough   . Thyroid disease   . Peripheral vascular disease   . ED (erectile dysfunction)   . Hypertension     08/17/12 - wife denies  . Walking pneumonia 2000's  . H/O hiatal hernia   . Migraine     "when I was a young boy"  . Arthritis     "hands" (08/28/2014)  . Anxiety   . Hypothyroidism   . Chronic kidney disease (CKD), stage III (moderate)     [name] 08/28/2014    Past Surgical History  Procedure Laterality Date  . Renal artery stent Left   . Kidney stone surgery  1989  . Lower extremity angiogram Left 03-20-2014    Dr. Tinnie Gens  . Tonsillectomy    . Cataract extraction, bilateral Bilateral   . Blepharoplasty Right     Kallie Locks, MS, Provisional LDN Pager # [phone] After hours/ weekend pager # [phone]

## 2014-08-29 NOTE — Consult Note (Addendum)
WOC wound consult note Reason for Consult: Consult requested for sacrum and right foot.  Pt has been followed by Dr Donnetta Hutching of the [facility] team prior to admission for right foot and has a history of PVD. Sacrum area with patchy areas of partial thickness skin loss, red and dry.  Dark reddish area surrounding.  This is NOT a pressure ulcer, appearance consistent with frequent friction/shear. Wound type: Right plantar heel area of foot with full thickness wound Pressure Ulcer POA: Yes; this is NOT a pressure ulcer Measurement: 2X1.9X.2cm Wound bed: 10% red, 90% yellow, painful to touch Drainage (amount, consistency, odor) Previously, this site was reported to have large amt drainage and strong odor.  Currently, no odor and minimal amt yellow drainage.  Pt has been on IV antibiotics. Periwound:Bilat legs with multiple red scabbed raised lesions.  No open wounds or drainage.  Generalized edema and erythremia. Dressing procedure/placement/frequency: Barrier cream to buttocks to protect and repel moisture. Bactroban to promote moist healing to right foot wound. Float heel to reduce pressure. Pt can resume follow-up with [facility]VS team after discharge. Please re-consult if further assistance is needed.  Thank-you,  Julien Girt MSN, [place], [place], [place], [place]

## 2014-08-30 DIAGNOSIS — I739 Peripheral vascular disease, unspecified: Secondary | ICD-10-CM | POA: Diagnosis not present

## 2014-08-30 LAB — CBC
HCT: 35.8 % — ABNORMAL LOW (ref 39.0–52.0)
HEMOGLOBIN: 11.7 g/dL — AB (ref 13.0–17.0)
MCH: 27.5 pg (ref 26.0–34.0)
MCHC: 32.7 g/dL (ref 30.0–36.0)
MCV: 84.2 fL (ref 78.0–100.0)
Platelets: 157 10*3/uL (ref 150–400)
RBC: 4.25 MIL/uL (ref 4.22–5.81)
RDW: 14.2 % (ref 11.5–15.5)
WBC: 8 10*3/uL (ref 4.0–10.5)

## 2014-08-30 LAB — BASIC METABOLIC PANEL
Anion gap: 15 (ref 5–15)
BUN: 17 mg/dL (ref 6–23)
CALCIUM: 8.7 mg/dL (ref 8.4–10.5)
CO2: 27 meq/L (ref 19–32)
Chloride: 99 mEq/L (ref 96–112)
Creatinine, Ser: 1.68 mg/dL — ABNORMAL HIGH (ref 0.50–1.35)
GFR calc Af Amer: 42 mL/min — ABNORMAL LOW (ref >90)
GFR calc non Af Amer: 36 mL/min — ABNORMAL LOW (ref >90)
GLUCOSE: 119 mg/dL — AB (ref 70–99)
Potassium: 3.9 mEq/L (ref 3.7–5.3)
Sodium: 141 mEq/L (ref 137–147)

## 2014-08-30 LAB — GLUCOSE, CAPILLARY
Glucose-Capillary: 136 mg/dL — ABNORMAL HIGH (ref 70–99)
Glucose-Capillary: 164 mg/dL — ABNORMAL HIGH (ref 70–99)

## 2014-08-30 MED ORDER — POLYETHYLENE GLYCOL 3350 17 G PO PACK
17.0000 g | PACK | Freq: Every day | ORAL | Status: DC
  Administered 2014-08-30: 17 g via ORAL
  Filled 2014-08-30 (×4): qty 1

## 2014-08-30 MED ORDER — DILTIAZEM HCL 30 MG PO TABS
30.0000 mg | ORAL_TABLET | Freq: Four times a day (QID) | ORAL | Status: DC
  Administered 2014-08-30 – 2014-09-02 (×12): 30 mg via ORAL
  Filled 2014-08-30 (×16): qty 1

## 2014-08-30 MED ORDER — SENNOSIDES-DOCUSATE SODIUM 8.6-50 MG PO TABS
1.0000 | ORAL_TABLET | Freq: Every day | ORAL | Status: DC
  Administered 2014-08-30: 1 via ORAL
  Filled 2014-08-30 (×4): qty 1

## 2014-08-30 MED ORDER — OXYCODONE HCL 5 MG PO TABS
5.0000 mg | ORAL_TABLET | Freq: Four times a day (QID) | ORAL | Status: DC | PRN
  Administered 2014-09-01: 21:00:00 5 mg via ORAL
  Filled 2014-08-30: qty 1

## 2014-08-30 NOTE — Progress Notes (Signed)
Patient refusing bed alarm.  Patient educated on fall risk.

## 2014-08-30 NOTE — Progress Notes (Signed)
Subjective: Continues to have moderate pain in the right foot at the location of his distal heel ulcer, mild DOE, no chest pain.  Objective: Vital signs in last 24 hours: Temp:  [97.9 F (36.6 C)-98.4 F (36.9 C)] 98.4 F (36.9 C) (09/12 0940) Pulse Rate:  [59-84] 84 (09/12 0940) Resp:  [17-18] 17 (09/12 0940) BP: (139-177)/(64-91) 163/83 mmHg (09/12 0940) SpO2:  [84 %-99 %] 84 % (09/12 0940) Weight:  [111.7 kg (246 lb 4.1 oz)] 111.7 kg (246 lb 4.1 oz) (09/11 2140) Weight change: 1 kg (2 lb 3.3 oz)   Intake/Output from previous day: 09/11 0701 - 09/12 0700 In: 120 [P.O.:120] Out: -    General appearance: alert, cooperative and no distress Resp: bibasilar crackles Cardio: irregularly irregular rhythm and with a grade 3/6 SEM GI: soft, non-tender; bowel sounds normal; no masses,  no organomegaly Extremities: bilateral 2+ leg edema, bilateral trace palpable pedal pulses, with shallow ulcer on the plantar aspect of the right heel without purulence or surrounding edema  Lab Results:  Recent Labs  08/29/14 0512 08/30/14 0645  WBC 7.9 8.0  HGB 11.1* 11.7*  HCT 33.8* 35.8*  PLT 146* 157   BMET  Recent Labs  08/29/14 0512 08/30/14 0645  NA 140 141  K 3.7 3.9  CL 100 99  CO2 28 27  GLUCOSE 109* 119*  BUN 17 17  CREATININE 1.63* 1.68*  CALCIUM 8.2* 8.7   CMET CMP     Component Value Date/Time   NA 141 08/30/2014 0645   K 3.9 08/30/2014 0645   CL 99 08/30/2014 0645   CO2 27 08/30/2014 0645   GLUCOSE 119* 08/30/2014 0645   BUN 17 08/30/2014 0645   CREATININE 1.68* 08/30/2014 0645   CALCIUM 8.7 08/30/2014 0645   PROT 6.4 08/28/2014 2020   ALBUMIN 2.9* 08/28/2014 2020   AST 15 08/28/2014 2020   ALT 14 08/28/2014 2020   ALKPHOS 80 08/28/2014 2020   BILITOT 0.7 08/28/2014 2020   GFRNONAA 36* 08/30/2014 0645   GFRAA 42* 08/30/2014 0645    CBG (last 3)   Recent Labs  08/29/14 0807 08/29/14 1734  GLUCAP 120* 117*    INR RESULTS:   Lab Results  Component Value Date    INR 1.18 08/28/2014   INR 1.07 01/03/2013     Studies/Results: Dg Ankle 2 Views Right  08/28/2014   CLINICAL DATA:  Right ankle pain, erythema, and swelling.  EXAM: RIGHT ANKLE - 2 VIEW  COMPARISON:  None.  FINDINGS: Abnormal soft tissue swelling circumferentially around the ankle and lower calf.  2 portable views of the right ankle demonstrate considerable hindfoot and midfoot spurring along with suspected pes planus.  No talar dome or plafond defect is identified.  Vascular calcifications noted. An 8 mm lucency along the underside of the calcaneus is probably incidental and unlikely to represent a focus of osteomyelitis.  IMPRESSION: 1. There is some lucency in the inferior calcaneus which is probably incidental, and unlikely to be from osteomyelitis. I suspect that this is more related to some adjacent posterior spurring. MRI is certainly a better weighted differentiate osteomyelitis in the foot. 2. Soft tissue swelling in the ankle, lower calf, and dorsal foot diffusely. 3. Probable pes planus.   Electronically Signed   By: Sherryl Barters M.D.   On: 08/28/2014 18:55   Portable Chest 1 View  08/28/2014   CLINICAL DATA:  Shortness of breath, COPD  EXAM: PORTABLE CHEST - 1 VIEW  COMPARISON:  Portable exam  1829 hr compared to 08/21/2012  FINDINGS: Enlargement of cardiac silhouette.  Calcified tortuous aorta.  Pulmonary vascularity normal.  Bibasilar interstitial infiltrates, improved versus previous exam, could represent pulmonary fibrosis or persistent versus recurrent infection.  Upper lungs clear.  No gross pleural effusion or pneumothorax.  IMPRESSION: Enlargement of cardiac silhouette.  Bibasilar interstitial infiltrates question persistent or recurrent infection versus fibrosis.   Electronically Signed   By: Lavonia Dana M.D.   On: 08/28/2014 18:51    Medications: I have reviewed the patient's current medications.  Assessment/Plan: #1 Right Foot Ulcer: stable on antibiotics with A-gram pending  in 2 days, and will increase pain meds for this #2 Dyspnea: mild and is likely due to diastolic dysfunction from atrial fibrillation with moderate to severe AS. Will check echocardiogram and adjust meds to slow heart rate #3 Chronic Kidney disease: stable on IVF   LOS: 2 days   Nephi Savage G 08/30/2014, 10:24 AM

## 2014-08-31 DIAGNOSIS — I359 Nonrheumatic aortic valve disorder, unspecified: Secondary | ICD-10-CM

## 2014-08-31 DIAGNOSIS — R011 Cardiac murmur, unspecified: Secondary | ICD-10-CM

## 2014-08-31 LAB — GLUCOSE, CAPILLARY
GLUCOSE-CAPILLARY: 122 mg/dL — AB (ref 70–99)
GLUCOSE-CAPILLARY: 174 mg/dL — AB (ref 70–99)

## 2014-08-31 MED ORDER — WHITE PETROLATUM GEL
Status: AC
  Administered 2014-08-31: 0.2
  Filled 2014-08-31: qty 5

## 2014-08-31 MED ORDER — MAGNESIUM CHLORIDE 64 MG PO TBEC
1.0000 | DELAYED_RELEASE_TABLET | Freq: Two times a day (BID) | ORAL | Status: DC
  Administered 2014-08-31 – 2014-09-02 (×4): 64 mg via ORAL
  Filled 2014-08-31 (×6): qty 1

## 2014-08-31 MED ORDER — ATORVASTATIN CALCIUM 20 MG PO TABS
20.0000 mg | ORAL_TABLET | Freq: Every day | ORAL | Status: DC
  Administered 2014-08-31 – 2014-09-01 (×2): 20 mg via ORAL
  Filled 2014-08-31 (×3): qty 1

## 2014-08-31 MED ORDER — SODIUM CHLORIDE 0.9 % IV SOLN
INTRAVENOUS | Status: DC
  Administered 2014-08-31 (×2): via INTRAVENOUS

## 2014-08-31 NOTE — Progress Notes (Signed)
Subjective: Still having some right foot pain, no dyspnea  Objective: Vital signs in last 24 hours: Temp:  [97.6 F (36.4 C)-98 F (36.7 C)] 98 F (36.7 C) (09/13 0937) Pulse Rate:  [76-160] 82 (09/13 0937) Resp:  [16-20] 20 (09/13 0937) BP: (152-178)/(55-78) 178/55 mmHg (09/13 0937) SpO2:  [93 %-96 %] 95 % (09/13 0937) Weight:  [111.7 kg (246 lb 4.1 oz)] 111.7 kg (246 lb 4.1 oz) (09/13 0432) Weight change: 0 kg (0 lb)   Intake/Output from previous day: 09/12 0701 - 09/13 0700 In: 2636.2 [P.O.:660; I.V.:576.2; IV Piggyback:1400] Out: 7 [Urine:4; Stool:3]   General appearance: alert, cooperative and no distress Resp: clear to auscultation bilaterally Cardio: irregularly irregular rhythm and with a 3/6 SEM Extremities: bilateral 2+ pitting edema of the legs, no change in appearance of shallow ulcer on plantar surface of right heel  Lab Results:  Recent Labs  08/29/14 0512 08/30/14 0645  WBC 7.9 8.0  HGB 11.1* 11.7*  HCT 33.8* 35.8*  PLT 146* 157   BMET  Recent Labs  08/29/14 0512 08/30/14 0645  NA 140 141  K 3.7 3.9  CL 100 99  CO2 28 27  GLUCOSE 109* 119*  BUN 17 17  CREATININE 1.63* 1.68*  CALCIUM 8.2* 8.7   CMET CMP     Component Value Date/Time   NA 141 08/30/2014 0645   K 3.9 08/30/2014 0645   CL 99 08/30/2014 0645   CO2 27 08/30/2014 0645   GLUCOSE 119* 08/30/2014 0645   BUN 17 08/30/2014 0645   CREATININE 1.68* 08/30/2014 0645   CALCIUM 8.7 08/30/2014 0645   PROT 6.4 08/28/2014 2020   ALBUMIN 2.9* 08/28/2014 2020   AST 15 08/28/2014 2020   ALT 14 08/28/2014 2020   ALKPHOS 80 08/28/2014 2020   BILITOT 0.7 08/28/2014 2020   GFRNONAA 36* 08/30/2014 0645   GFRAA 42* 08/30/2014 0645    CBG (last 3)   Recent Labs  08/30/14 0747 08/30/14 1717 08/31/14 0803  GLUCAP 136* 164* 122*    INR RESULTS:   Lab Results  Component Value Date   INR 1.18 08/28/2014   INR 1.07 01/03/2013     Studies/Results: No results found.  Medications: I have reviewed  the patient's current medications.  Assessment/Plan: #1 Right Heel Ulcer: stable on antibiotics with arteriogram planned for tomorrow. Will hold on adjustment of meds for HTN to optimize renal function prior to arteriogram. #2 Dyspnea: improved and likely due to diastolic dysfunction from afib and aortic stenosis. Echo is pending #3 DM2: stable on currrent meds.   LOS: 3 days   [name] G 08/31/2014, 9:59 AM

## 2014-08-31 NOTE — Progress Notes (Signed)
D/t continued pain and appropriate care will get MRI to R/Out Osteo. This is low likliehood d/t Superficial ulcer but pain is significant

## 2014-08-31 NOTE — Progress Notes (Signed)
  Echocardiogram 2D Echocardiogram has been performed.  [name], [name] 08/31/2014, 12:32 PM

## 2014-08-31 NOTE — Progress Notes (Signed)
    Pre-op orders placed for angiogram

## 2014-09-01 ENCOUNTER — Ambulatory Visit (HOSPITAL_COMMUNITY): Admit: 2014-09-01 | Payer: Self-pay | Admitting: Vascular Surgery

## 2014-09-01 ENCOUNTER — Inpatient Hospital Stay (HOSPITAL_COMMUNITY): Payer: Medicare Other

## 2014-09-01 ENCOUNTER — Encounter (HOSPITAL_COMMUNITY)
Admission: AD | Disposition: A | Discharge status: Home / Self Care | Payer: Self-pay | Source: Ambulatory Visit | Attending: Internal Medicine

## 2014-09-01 LAB — CBC
HCT: 34.3 % — ABNORMAL LOW (ref 39.0–52.0)
Hemoglobin: 11.1 g/dL — ABNORMAL LOW (ref 13.0–17.0)
MCH: 27.5 pg (ref 26.0–34.0)
MCHC: 32.4 g/dL (ref 30.0–36.0)
MCV: 84.9 fL (ref 78.0–100.0)
PLATELETS: 154 10*3/uL (ref 150–400)
RBC: 4.04 MIL/uL — ABNORMAL LOW (ref 4.22–5.81)
RDW: 14.3 % (ref 11.5–15.5)
WBC: 7.4 10*3/uL (ref 4.0–10.5)

## 2014-09-01 LAB — BASIC METABOLIC PANEL
ANION GAP: 16 — AB (ref 5–15)
BUN: 16 mg/dL (ref 6–23)
CHLORIDE: 103 meq/L (ref 96–112)
CO2: 22 mEq/L (ref 19–32)
Calcium: 8.3 mg/dL — ABNORMAL LOW (ref 8.4–10.5)
Creatinine, Ser: 1.58 mg/dL — ABNORMAL HIGH (ref 0.50–1.35)
GFR calc non Af Amer: 39 mL/min — ABNORMAL LOW (ref >90)
GFR, EST AFRICAN AMERICAN: 45 mL/min — AB (ref >90)
Glucose, Bld: 110 mg/dL — ABNORMAL HIGH (ref 70–99)
POTASSIUM: 3.7 meq/L (ref 3.7–5.3)
SODIUM: 141 meq/L (ref 137–147)

## 2014-09-01 LAB — SURGICAL PCR SCREEN
MRSA, PCR: NEGATIVE
Staphylococcus aureus: NEGATIVE

## 2014-09-01 LAB — GLUCOSE, CAPILLARY
Glucose-Capillary: 157 mg/dL — ABNORMAL HIGH (ref 70–99)
Glucose-Capillary: 103 mg/dL — ABNORMAL HIGH (ref 70–99)

## 2014-09-01 LAB — PROTIME-INR
INR: 1.04 (ref 0.00–1.49)
PROTHROMBIN TIME: 13.6 s (ref 11.6–15.2)

## 2014-09-01 SURGERY — ANGIOGRAM EXTREMITY RIGHT
Laterality: Right

## 2014-09-01 MED ORDER — HEPARIN (PORCINE) IN NACL 2-0.9 UNIT/ML-% IJ SOLN
INTRAMUSCULAR | Status: AC
  Filled 2014-09-01: qty 1000

## 2014-09-01 MED ORDER — SODIUM CHLORIDE 0.9 % IV SOLN
1.0000 mL/kg/h | INTRAVENOUS | Status: AC
  Administered 2014-09-01: 1 mL/kg/h via INTRAVENOUS

## 2014-09-01 MED ORDER — MIDAZOLAM HCL 2 MG/2ML IJ SOLN
INTRAMUSCULAR | Status: AC
  Filled 2014-09-01: qty 2

## 2014-09-01 MED ORDER — ONDANSETRON HCL 4 MG/2ML IJ SOLN: 4.0000 mg | Freq: Four times a day (QID) | INTRAMUSCULAR | Status: DC | PRN

## 2014-09-01 MED ORDER — LABETALOL HCL 5 MG/ML IV SOLN
INTRAVENOUS | Status: AC
Start: 2014-09-01 — End: 2014-09-01
  Filled 2014-09-01: qty 4

## 2014-09-01 MED ORDER — MORPHINE SULFATE 2 MG/ML IJ SOLN
INTRAMUSCULAR | Status: AC
  Filled 2014-09-01: qty 1

## 2014-09-01 MED ORDER — FENTANYL CITRATE 0.05 MG/ML IJ SOLN
INTRAMUSCULAR | Status: AC
Start: 2014-09-01 — End: 2014-09-01
  Filled 2014-09-01: qty 2

## 2014-09-01 MED ORDER — KCL IN DEXTROSE-NACL 20-5-0.45 MEQ/L-%-% IV SOLN
INTRAVENOUS | Status: DC
  Administered 2014-09-01: 14:00:00 via INTRAVENOUS
  Filled 2014-09-01: qty 1000

## 2014-09-01 MED ORDER — ALUM & MAG HYDROXIDE-SIMETH 200-200-20 MG/5ML PO SUSP
30.0000 mL | ORAL | Status: DC | PRN
Start: 2014-09-01 — End: 2014-09-02
  Administered 2014-09-01: 30 mL via ORAL
  Filled 2014-09-01: qty 30

## 2014-09-01 MED ORDER — ACETAMINOPHEN 325 MG PO TABS: 650.0000 mg | ORAL_TABLET | ORAL | Status: DC | PRN

## 2014-09-01 MED ORDER — LIDOCAINE HCL (PF) 1 % IJ SOLN
INTRAMUSCULAR | Status: AC
  Filled 2014-09-01: qty 30

## 2014-09-01 MED ORDER — VANCOMYCIN HCL 10 G IV SOLR
1500.0000 mg | INTRAVENOUS | Status: DC
  Administered 2014-09-01: 15:00:00 1500 mg via INTRAVENOUS
  Filled 2014-09-01 (×2): qty 1500

## 2014-09-01 MED ORDER — HYDROCODONE-ACETAMINOPHEN 5-325 MG PO TABS
ORAL_TABLET | ORAL | Status: AC
  Filled 2014-09-01: qty 1

## 2014-09-01 NOTE — Progress Notes (Signed)
Site area: left groin Site Prior to Removal:  Level 0 Pressure Applied For: 20 minutes, sheath removed by Mena Pauls  Manual: yes   Patient Status During Pull:  stable Post Pull Site:  Level 0 Post Pull Instructions Given:  yes Post Pull Pulses Present: yes Dressing Applied:  yes Bedrest begins @ [id number] Comments: no complications

## 2014-09-01 NOTE — Progress Notes (Addendum)
ANTIBIOTIC CONSULT NOTE - FOLLOW UP  Pharmacy Consult for vancomycin Indication: cellulitis  No Known Allergies  Patient Measurements: Height: 5\' 10"  (177.8 cm) Weight: 245 lb 2.4 oz (111.2 kg) IBW/kg (Calculated) : 73 Adjusted Body Weight:   Vital Signs: Temp: 97.9 F (36.6 C) (09/14 1100) Temp src: Oral (09/14 1100) BP: 159/54 mmHg (09/14 1111) Pulse Rate: 74 (09/14 1100) Intake/Output from previous day: 09/13 0701 - 09/14 0700 In: 3100 [P.O.:1200; I.V.:1750; IV Piggyback:150] Out: 8 [Urine:4; Stool:4] Intake/Output from this shift:    Labs:  Recent Labs  08/30/14 0645 09/01/14 0618  WBC 8.0 7.4  HGB 11.7* 11.1*  PLT 157 154  CREATININE 1.68* 1.58*   Estimated Creatinine Clearance: 44.2 ml/min (by C-G formula based on Cr of 1.58). No results found for this basename: VANCOTROUGH, Corlis Leak, VANCORANDOM, GENTTROUGH, GENTPEAK, GENTRANDOM, TOBRATROUGH, TOBRAPEAK, TOBRARND, AMIKACINPEAK, AMIKACINTROU, AMIKACIN,  in the last 72 hours   Microbiology: Recent Results (from the past 720 hour(s))  SURGICAL PCR SCREEN     Status: None   Collection Time    09/01/14  2:33 AM      Result Value Ref Range Status   MRSA, PCR NEGATIVE  NEGATIVE Final   Staphylococcus aureus NEGATIVE  NEGATIVE Final   Comment:            The Xpert SA Assay (FDA     approved for NASAL specimens     in patients over 44 years of age),     is one component of     a comprehensive surveillance     program.  Test performance has     been validated by [employer] for patients greater     than or equal to 34 year old.     It is not intended     to diagnose infection nor to     guide or monitor treatment.    Anti-infectives   Start     Dose/Rate Route Frequency Ordered Stop   09/01/14 1300  vancomycin (VANCOCIN) 1,500 mg in sodium chloride 0.9 % 500 mL IVPB     1,500 mg 250 mL/hr over 120 Minutes Intravenous Every 24 hours 09/01/14 1130     08/28/14 2100  vancomycin (VANCOCIN) 1,500 mg in  sodium chloride 0.9 % 500 mL IVPB  Status:  Discontinued     1,500 mg 250 mL/hr over 120 Minutes Intravenous Every 48 hours 08/28/14 1852 09/01/14 1130   08/28/14 1845  piperacillin-tazobactam (ZOSYN) IVPB 3.375 g     3.375 g 12.5 mL/hr over 240 Minutes Intravenous Every 8 hours 08/28/14 1844     08/28/14 1830  piperacillin-tazobactam (ZOSYN) IVPB 3.375 g  Status:  Discontinued     3.375 g 100 mL/hr over 30 Minutes Intravenous 4 times per day 08/28/14 1821 08/28/14 1841   08/28/14 1830  vancomycin (VANCOCIN) IVPB 1000 mg/200 mL premix  Status:  Discontinued     1,000 mg 200 mL/hr over 60 Minutes Intravenous Every 24 hours 08/28/14 1821 08/28/14 1851      Assessment: 80 yo male with cellulitis is currently on vancomycin.  SCr a little better to 1.59 (CrCl ~44).  Last vancomycin dose was 09/12 ~2100  Goal of Therapy:  Vancomycin trough level 10-15 mcg/ml  Plan:  - change vancomycin to 1500mg  iv q24h, 1st dose now - reassess and check vancomycin trough when it's appropriate.   Murray Guzzetta, Tsz-Yin 09/01/2014,11:32 AM

## 2014-09-01 NOTE — Progress Notes (Signed)
Patient ID: [name], male   DOB: 1929-11-13, 80 y.o.   MRN: [id number] Reviewed the patient's arteriogram from today and discussed with the patient and also with his wife by telephone. He does have complete occlusion of his right superficial femoral artery at the origin with reconstitution of the above-knee popliteal. He has open tibial runoff initially but has extensive small vessel disease into his foot.  MRI shows no definitive evidence for involvement of his calcaneus  On physical exam his heel wound is granulating with minimal surrounding erythema looks quite better. He does continue to have extensive bilateral lower extremity edema.  Recommend continued current treatment with elevation compression and oral antibiotics if he is discharged. Feel it would be appropriate for discharge tomorrow if indicated. I will see him in the office as scheduled in one week. Would be a candidate for right femoral to above-knee popliteal bypass if he has progression of woundr issues.

## 2014-09-01 NOTE — Op Note (Signed)
   PATIENT: [name]  MRN: [id number] DOB: [birth year]    DATE OF PROCEDURE: 09/01/2014  INDICATIONS: [name] is a 80 y.o. male with a nonhealing wound on his right foot. He presents for arteriography.  PROCEDURE:  1. Ultrasound-guided catheterization of the left common femoral artery. 2. Selective catheterization of the right external iliac artery with right lower extremity runoff  SURGEON: Judeth Cornfield. Scot Dock, MD, FACS  ANESTHESIA: local with sedation   EBL: minimal  TECHNIQUE: The patient was taken to the peripheral vascular lab and received half a milligram of Versed and 25 mcg of fentanyl. Both groins were prepped and draped in usual sterile fashion under ultrasound guidance, the left common femoral artery was cannulated with a micropuncture needle and micropuncture sheath introduced over the wire. The micropuncture sheath was exchanged for a 5 [place] sheath over a [employer] wire. The crossover catheter was then advanced into the distal aorta and then positioned into the proximal right common iliac artery. The guidewire was advanced into the common femoral artery and the crossover catheter exchanged for an endhole catheter. Selective right external iliac arterial was obtained with right lower extremity runoff. At the completion of the procedure, the catheter was removed. The patient was transferred to the holding area for removal of the sheath.  FINDINGS:  1. Distal right external iliac artery and common femoral artery are patent. The deep femoral artery is patent. The superficial femoral artery is occluded at its origin. 2. There is reconstitution of the above-knee popliteal artery which has mild diffuse disease. There is two-vessel runoff on the right knee anterior tibial and peroneal arteries. The posterior tibial artery is occluded.  Deitra Mayo, MD, FACS [facility]  DATE OF DICTATION:   09/01/2014

## 2014-09-01 NOTE — Care Management Note (Signed)
    Page 1 of 1   09/01/2014     3:06:53 PM CARE MANAGEMENT NOTE 09/01/2014  Patient:  [name], [name]   Account Number:  [id number]  Date Initiated:  09/01/2014  Documentation initiated by:  [facility]  Subjective/Objective Assessment:   a 80 y.o. male with a nonhealing wound on his right foot.//Home with spouse.     Action/Plan:   PROCEDURE:  1. Ultrasound-guided catheterization of the left common femoral artery.  2. Selective catheterization of the right external iliac artery with right lower extremity runoff.//   Anticipated DC Date:  09/03/2014   Anticipated DC Plan:  [place]  CM consult      Choice offered to / List presented to:             Status of service:  In process, will continue to follow Medicare Important Message given?  YES (If response is "NO", the following Medicare IM given date fields will be blank) Date Medicare IM given:  09/01/2014 Medicare IM given by:  [facility] Date Additional Medicare IM given:   Additional Medicare IM given by:    Discharge Disposition:    Per UR Regulation:  Reviewed for med. necessity/level of care/duration of stay  If discussed at [place] of Stay Meetings, dates discussed:    Comments:

## 2014-09-01 NOTE — Progress Notes (Signed)
Report given to RN [facility], all questions answered.

## 2014-09-01 NOTE — Progress Notes (Signed)
Subjective: Admitted Thursday with very swollen RLE c R heel Ulcer/foul smelling drainage/pain and RLE cellulitis. Subjective fevers at Home.  Known PAD.  Remained on IV Abx over the weekend and had pain meds adjusted  Still in pain c walking but foot looks much better.  Got hydrated last night to prevent contrast nephropathy for angiogram.   Objective: Vital signs in last 24 hours: Temp:  [97.7 F (36.5 C)-98.3 F (36.8 C)] 98.3 F (36.8 C) (09/14 0545) Pulse Rate:  [65-86] 80 (09/14 0545) Resp:  [18-20] 18 (09/14 0545) BP: (166-188)/(52-74) 188/74 mmHg (09/14 0545) SpO2:  [93 %-96 %] 95 % (09/14 0545) Weight:  [111.2 kg (245 lb 2.4 oz)] 111.2 kg (245 lb 2.4 oz) (09/13 2126) Weight change: -0.5 kg (-1 lb 1.6 oz) Last BM Date: 08/31/14  CBG (last 3)   Recent Labs  08/30/14 1717 08/31/14 0803 08/31/14 1143  GLUCAP 164* 122* 174*    Intake/Output from previous day:  Intake/Output Summary (Last 24 hours) at 09/01/14 0658 Last data filed at 09/01/14 0600  Gross per 24 hour  Intake   3100 ml  Output      8 ml  Net   3092 ml   09/13 0701 - 09/14 0700 In: 3100 [P.O.:1200; I.V.:1750; IV Piggyback:150] Out: 8 [Urine:4; Stool:4]   Physical Exam General appearance: Elderly  Eyes: no scleral icterus  Throat: oropharynx moist without erythema  Resp: distant at bases but clear.  Cardio: Reg, 3/33m  GI: Obese, soft, non-tender; bowel sounds normal; no masses, no organomegaly  Extremities: + B LE Edema but markedly less.  R heel 2 cm Superficial ulcer c surrounding callous.  Venous stasis noted.  Nocellulitis.  Great dressing noted   Lab Results:  Recent Labs  08/30/14 0645  NA 141  K 3.9  CL 99  CO2 27  GLUCOSE 119*  BUN 17  CREATININE 1.68*  CALCIUM 8.7    No results found for this basename: AST, ALT, ALKPHOS, BILITOT, PROT, ALBUMIN,  in the last 72 hours   Recent Labs  08/30/14 0645  WBC 8.0  HGB 11.7*  HCT 35.8*  MCV 84.2  PLT 157    Lab  Results  Component Value Date   INR 1.18 08/28/2014   INR 1.07 01/03/2013    No results found for this basename: CKTOTAL, CKMB, CKMBINDEX, TROPONINI,  in the last 72 hours  No results found for this basename: TSH, T4TOTAL, FREET3, T3FREE, THYROIDAB,  in the last 72 hours  No results found for this basename: VITAMINB12, FOLATE, FERRITIN, TIBC, IRON, RETICCTPCT,  in the last 72 hours  Micro Results: Recent Results (from the past 240 hour(s))  SURGICAL PCR SCREEN     Status: None   Collection Time    09/01/14  2:33 AM      Result Value Ref Range Status   MRSA, PCR NEGATIVE  NEGATIVE Final   Staphylococcus aureus NEGATIVE  NEGATIVE Final   Comment:            The Xpert SA Assay (FDA     approved for NASAL specimens     in patients over 58 years of age),     is one component of     a comprehensive surveillance     program.  Test performance has     been validated by [employer] for patients greater     than or equal to 28 year old.     It is not intended  to diagnose infection nor to     guide or monitor treatment.     Studies/Results: No results found.   Medications: Scheduled: . aspirin EC  81 mg Oral q morning - 10a  . atorvastatin  20 mg Oral QHS  . carbidopa-levodopa  1 tablet Oral QPM  . diltiazem  30 mg Oral 4 times per day  . enoxaparin (LOVENOX) injection  55 mg Subcutaneous Q24H  . feeding supplement (ENSURE COMPLETE)  237 mL Oral BID BM  . fentaNYL  25 mcg Transdermal Q72H  . furosemide  20 mg Intravenous Daily  . gabapentin  300 mg Oral BID  . insulin aspart  0-9 Units Subcutaneous BID WC  . levothyroxine  112 mcg Oral QAC breakfast  . magnesium chloride  1 tablet Oral BID  . mirtazapine  30 mg Oral QHS  . mometasone-formoterol  2 puff Inhalation BID  . mupirocin cream   Topical Daily  . piperacillin-tazobactam (ZOSYN)  IV  3.375 g Intravenous Q8H  . polyethylene glycol  17 g Oral Daily  . QUEtiapine  25 mg Oral QHS  . senna  1 tablet Oral BID   . senna-docusate  1 tablet Oral QHS  . sertraline  100 mg Oral QPM  . vancomycin (VANCOCIN) IVPB  1,500 mg Intravenous Q48H   Continuous: . sodium chloride 10 mL/hr at 08/31/14 1100  . sodium chloride 100 mL/hr at 08/31/14 2245     Assessment/Plan: Active Problems:   HYPOTHYROIDISM   COPD   Chronic pain   Chronic kidney disease, stage III (moderate)   Leg swelling   Atherosclerosis of native arteries of the extremities with ulceration(440.23)   Foot ulcer   Cellulitis   PAD (peripheral artery disease)   Infected ulcer of skin  Superficial R heel Ulcer c RLE cellulitis in pt c known PAD.  On IV Abx  On Narcs for pain control.  Keeping weight off.  For LE Arterial Angiogram.  MRI P for possible Osteomyelitis. X ray (-). WBC is Negative and Sed rate is moderately elevated.  Continue Dressings and appreciate Wound care consult.   ABI's 03/26/14:  Right: 0.64  Left: 0.56   Low Albumin - Protein Malnutrition.  Mild Anemia - Hbg 11.1-11.4  Mild Thrombocytopenia - 135-157 is fine  DVT - Proph ordered  CBGs 90-180. A1C 6.2. CBGs BID  CKD3 - Stable  COPD - Stable  HTN - Fine to high. May need adjustment  RLS - Sinemet  Murmur and Dyspnea - better. EF 65-70%. Mild LVH. Mod AS. Grade 1 Diastolic Dysfunction. Pulm HTN c PAP pressures 93mmHg  ID -  Anti-infectives   Start     Dose/Rate Route Frequency Ordered Stop   08/28/14 2100  vancomycin (VANCOCIN) 1,500 mg in sodium chloride 0.9 % 500 mL IVPB     1,500 mg 250 mL/hr over 120 Minutes Intravenous Every 48 hours 08/28/14 1852     08/28/14 1845  piperacillin-tazobactam (ZOSYN) IVPB 3.375 g     3.375 g 12.5 mL/hr over 240 Minutes Intravenous Every 8 hours 08/28/14 1844     08/28/14 1830  piperacillin-tazobactam (ZOSYN) IVPB 3.375 g  Status:  Discontinued     3.375 g 100 mL/hr over 30 Minutes Intravenous 4 times per day 08/28/14 1821 08/28/14 1841   08/28/14 1830  vancomycin (VANCOCIN) IVPB 1000 mg/200 mL premix  Status:   Discontinued     1,000 mg 200 mL/hr over 60 Minutes Intravenous Every 24 hours 08/28/14 1821 08/28/14 1851  Anticipate 1-2 more inpt days depending on angio and MRI   LOS: 4 days   Jacqlyn Marolf M 09/01/2014, 6:58 AM

## 2014-09-01 NOTE — H&P (View-Only) (Signed)
[facility]  Reason for Consult:  Non healing wound of right heel Referring Physician:  Virgina Jock MRN #:  [id number]  History of Present Illness: This is a 80 y.o. male with known lower extremity arterial insufficiency, left SFA occlusion and CKD stage III. He is well known to Dr. Donnetta Hutching and was last seen in the [facility] office on 07/29/14 for follow up of his right foot ulcer. He was told to continue local wound management and compression and to follow up in one month. His ulcer has not improved and he reports foul smelling drainage. If the ulcer did not improve with conservative management, an arteriogram would be warranted for further evaluation. He was admitted and started on IV antibiotics.   Of note, his last arteriogram was on 03/20/13 to evaluate his left leg due to an left foot wound. His left foot wound has since healed with conservative management.  On ROS, he admits fever and chills. He also notes intermittent numbness of his feet. He denies any intermittent claudication but is mostly sedentary. He has had chronic redness and lesions of his lower legs for the past two years. He denies any chest pain, shortness of breath and weakness of the arms and legs.   He has hypertension managed not on antihypertensive meds. He has hyperlipidemia on a statin. He is on a daily aspirin. He is not diabetic.   Past Medical History  Diagnosis Date  . Hyperlipidemia   . COPD (chronic obstructive pulmonary disease)   . Leg pain   . GERD (gastroesophageal reflux disease)   . Depression   . Dizziness   . Productive cough   . Thyroid disease   . Peripheral vascular disease   . ED (erectile dysfunction)   . Hypertension     08/17/12 - wife denies  . Walking pneumonia 2000's  . H/O hiatal hernia   . Migraine     "when I was a young boy"  . Arthritis     "hands" (08/28/2014)  . Anxiety   . Hypothyroidism   . Chronic kidney disease (CKD), stage III (moderate)     [name]  08/28/2014   Past Surgical History  Procedure Laterality Date  . Renal artery stent Left   . Kidney stone surgery  1989  . Lower extremity angiogram Left 03-20-2014    Dr. Tinnie Gens  . Tonsillectomy    . Cataract extraction, bilateral Bilateral   . Blepharoplasty Right     No Known Allergies  Prior to Admission medications   Medication Sig Start Date End Date Taking? Authorizing Provider  acetaminophen (TYLENOL) 325 MG tablet Take 650 mg by mouth every 6 (six) hours as needed. For pain or fever   Yes Historical Provider, MD  albuterol (PROVENTIL) (5 MG/ML) 0.5% nebulizer solution Take 2.5 mg by nebulization every 6 (six) hours as needed. For shortness of breath   Yes Historical Provider, MD  aspirin EC 81 MG tablet Take 81 mg by mouth every morning.    Yes Historical Provider, MD  carbidopa-levodopa (SINEMET) 10-100 MG per tablet Take 1 tablet by mouth every evening.    Yes Historical Provider, MD  clonazePAM (KLONOPIN) 0.5 MG tablet Take 0.5-1 mg by mouth 2 (two) times daily as needed for anxiety (0.5mg  in the morning and 1mg  at night). Patient uses 0.5 mg in the morning, and 1 mg in the evening.   Yes Historical Provider, MD  feeding supplement (ENSURE) PUDG Take 1 Container by mouth 3 (three)  times daily between meals. 08/23/12  Yes [name], MD  fentaNYL (DURAGESIC - DOSED MCG/HR) 25 MCG/HR patch Place 25 mcg onto the skin every 3 (three) days.   Yes Historical Provider, MD  gabapentin (NEURONTIN) 300 MG capsule Take 300 mg by mouth 2 (two) times daily.    Yes Historical Provider, MD  HYDROcodone-acetaminophen (NORCO/VICODIN) 5-325 MG per tablet Take 1 tablet by mouth every 6 (six) hours as needed. For pain   Yes Historical Provider, MD  ipratropium (ATROVENT) 0.02 % nebulizer solution Take 500 mcg by nebulization every 6 (six) hours as needed. For shortness of breath   Yes Historical Provider, MD  levothyroxine (SYNTHROID, LEVOTHROID) 112 MCG tablet Take 112 mcg by mouth every  morning.    Yes Historical Provider, MD  Melatonin 3 MG CAPS Take 3 mg by mouth every evening.    Yes Historical Provider, MD  mirtazapine (REMERON) 30 MG tablet Take 30 mg by mouth at bedtime.    Yes Historical Provider, MD  QUEtiapine (SEROQUEL) 25 MG tablet Take 50 mg by mouth at bedtime.    Yes Historical Provider, MD  sertraline (ZOLOFT) 100 MG tablet Take 100 mg by mouth every evening.    Yes Historical Provider, MD  simvastatin (ZOCOR) 40 MG tablet Take 40 mg by mouth at bedtime.    Yes Historical Provider, MD    History   Social History  . Marital Status: Married    Spouse Name: N/A    Number of Children: N/A  . Years of Education: N/A   Occupational History  . Not on file.   Social History Main Topics  . Smoking status: Former Smoker -- 1.00 packs/day for 25 years    Types: Cigarettes    Quit date: 08/17/2012  . Smokeless tobacco: Never Used  . Alcohol Use: Yes     Comment: 08/28/2014 "last drink was in ~ 2012"  . Drug Use: No  . Sexual Activity: Not on file   Other Topics Concern  . Not on file   Social History Narrative  . No narrative on file   Family History  Problem Relation Age of Onset  . Other Mother     AAA  . AAA (abdominal aortic aneurysm) Mother   . Heart attack Father   . Alcohol abuse Father   . Diabetes Father   . Other Father     amputation  . Diabetes Brother   . Coronary artery disease Brother   . Dementia Brother     ROS: [x]  Positive   [ ]  Negative   [ ]  All sytems reviewed and are negative  Cardiovascular: []  chest pain/pressure []  palpitations []  SOB lying flat []  DOE []  pain in legs while walking []  pain in legs at rest []  pain in legs at night [x]  non-healing ulcers []  hx of DVT [x]  swelling in legs  Pulmonary: []  productive cough []  asthma/wheezing []  home O2  Neurologic: []  weakness in []  arms []  legs [x]  numbness in []  arms []  feet []  hx of CVA []  mini stroke [] difficulty speaking or slurred speech []   temporary loss of vision in one eye []  dizziness  Hematologic: []  hx of cancer []  bleeding problems []  problems with blood clotting easily  Endocrine:   []  diabetes []  thyroid disease  GI []  vomiting blood []  blood in stool  GU: []  CKD/renal failure []  HD--[]  M/W/F or []  T/T/S []  burning with urination []  blood in urine  Psychiatric: []  anxiety []  depression  Musculoskeletal: []  arthritis []  joint pain  Integumentary: []  rashes [x]  ulcers  Constitutional: [x]  fever [x]  chills   Physical Examination  Filed Vitals:   08/29/14 0415  BP: 181/80  Pulse: 88  Temp: 97.9 F (36.6 C)  Resp: 18   Body mass index is 34.17 kg/(m^2).  General:  WD obese male in NAD Gait: Not observed HENT: WNL, normocephalic Pulmonary: normal non-labored breathing, fine crackles at bases Cardiac: regular, without  Murmurs, rubs or gallops; without carotid bruits Abdomen: soft, NT/ND, no masses Skin: with erythema and excoriations of lower shins bilaterally. Right plantar heel ulcer. No drainage or odor noted.  Vascular Exam/Pulses:  Right Left  Radial 2+ (normal) 2+ (normal)  Ulnar 2+ (normal) 2+ (normal)  Femoral 2+ (normal) 2+ (normal)  Popliteal Non palpable Non palpable  DP Non palpable, + doppler signal Non palpable, + doppler signal  PT Non palpable, + doppler signal Non palpable, + doppler signal   Extremities: bilateral lower leg edema, right > left. No ischemic changes.  Musculoskeletal: no muscle wasting or atrophy  Neurologic: A&O X 3; Appropriate Affect ; SENSATION: normal; MOTOR FUNCTION:  moving all extremities equally. Speech is fluent/normal   CBC    Component Value Date/Time   WBC 7.9 08/29/2014 0512   RBC 3.98* 08/29/2014 0512   HGB 11.1* 08/29/2014 0512   HCT 33.8* 08/29/2014 0512   PLT 146* 08/29/2014 0512   MCV 84.9 08/29/2014 0512   MCH 27.9 08/29/2014 0512   MCHC 32.8 08/29/2014 0512   RDW 14.3 08/29/2014 0512   LYMPHSABS 1.0 08/17/2012 1526   MONOABS  0.8 08/17/2012 1526   EOSABS 0.0 08/17/2012 1526   BASOSABS 0.0 08/17/2012 1526    BMET    Component Value Date/Time   NA 140 08/29/2014 0512   K 3.7 08/29/2014 0512   CL 100 08/29/2014 0512   CO2 28 08/29/2014 0512   GLUCOSE 109* 08/29/2014 0512   BUN 17 08/29/2014 0512   CREATININE 1.63* 08/29/2014 0512   CALCIUM 8.2* 08/29/2014 0512   GFRNONAA 37* 08/29/2014 0512   GFRAA 43* 08/29/2014 0512    COAGS: Lab Results  Component Value Date   INR 1.18 08/28/2014   INR 1.07 01/03/2013    ASSESSMENT: This is a 80 y.o. male with right foot ulcer and known lower extremity arterial insufficiency, left SFA occlusion and CKD stage III.  PLAN: -He has doppler signals bilaterally and palpable femoral pulses. Motor and sensory intact.  -Will plan on aortogram with bilateral runoff on Monday, 09/01/14 with Dr. Scot Dock.  -His creatinine is 1.6. Will rehydrate him on Sunday evening for procedure Monday.  -Continue antibiotics.     Virgina Jock, PA-C [facility] Office: [phone] Pager: [phone]  I have examined the patient, reviewed and agree with above. Difficult management problem. Patient has extensive lower extremity edema related to fluid overload. He does have a chronic wound on his right heel. This is not typical of a pressure sore. This is on the plantar aspect of his heel. Is approximately one and 1/2 cm in diameter. Does not appear to be extending to the bone. Had hoped for healing without revascularization. Will obtain arteriogram with limited contrast on Monday for further evaluation. We'll make recommendations about continued wound care versus revascularization depending on the arteriogram  Quint Chestnut, MD 08/29/2014 3:39 PM

## 2014-09-01 NOTE — Interval H&P Note (Signed)
History and Physical Interval Note:  09/01/2014 9:04 AM  [name]  has presented today for surgery, with the diagnosis of non-healing wound on right heel, pad  The various methods of treatment have been discussed with the patient and family. After consideration of risks, benefits and other options for treatment, the patient has consented to  Procedure(s): ABDOMINAL AORTAGRAM (N/A) as a surgical intervention .  The patient's history has been reviewed, patient examined, no change in status, stable for surgery.  I have reviewed the patient's chart and labs.  Questions were answered to the patient's satisfaction.     DICKSON,CHRISTOPHER S

## 2014-09-02 LAB — BASIC METABOLIC PANEL
Anion gap: 11 (ref 5–15)
BUN: 16 mg/dL (ref 6–23)
CO2: 25 mEq/L (ref 19–32)
Calcium: 8.5 mg/dL (ref 8.4–10.5)
Chloride: 104 mEq/L (ref 96–112)
Creatinine, Ser: 1.54 mg/dL — ABNORMAL HIGH (ref 0.50–1.35)
GFR, EST AFRICAN AMERICAN: 46 mL/min — AB (ref >90)
GFR, EST NON AFRICAN AMERICAN: 40 mL/min — AB (ref >90)
Glucose, Bld: 110 mg/dL — ABNORMAL HIGH (ref 70–99)
Potassium: 4.1 mEq/L (ref 3.7–5.3)
Sodium: 140 mEq/L (ref 137–147)

## 2014-09-02 LAB — CBC
HCT: 33.4 % — ABNORMAL LOW (ref 39.0–52.0)
Hemoglobin: 10.8 g/dL — ABNORMAL LOW (ref 13.0–17.0)
MCH: 27.6 pg (ref 26.0–34.0)
MCHC: 32.3 g/dL (ref 30.0–36.0)
MCV: 85.4 fL (ref 78.0–100.0)
Platelets: 147 10*3/uL — ABNORMAL LOW (ref 150–400)
RBC: 3.91 MIL/uL — ABNORMAL LOW (ref 4.22–5.81)
RDW: 14.5 % (ref 11.5–15.5)
WBC: 7.9 10*3/uL (ref 4.0–10.5)

## 2014-09-02 LAB — SEDIMENTATION RATE: SED RATE: 65 mm/h — AB (ref 0–16)

## 2014-09-02 LAB — C-REACTIVE PROTEIN: CRP: 1 mg/dL — ABNORMAL HIGH (ref <0.60)

## 2014-09-02 MED ORDER — MUPIROCIN 2 % EX OINT: 1.0000 "application " | TOPICAL_OINTMENT | Freq: Two times a day (BID) | CUTANEOUS | Status: DC

## 2014-09-02 MED ORDER — DOXYCYCLINE HYCLATE 100 MG PO CAPS: 100.0000 mg | ORAL_CAPSULE | Freq: Two times a day (BID) | ORAL | Status: DC

## 2014-09-02 MED ORDER — DESONIDE LOT-MOISTURIZING CREA 0.05 % EX KIT: PACK | CUTANEOUS | Status: DC

## 2014-09-02 MED ORDER — TORSEMIDE 10 MG PO TABS
10.0000 mg | ORAL_TABLET | Freq: Every day | ORAL | Status: DC
  Administered 2014-09-02: 10 mg via ORAL
  Filled 2014-09-02: qty 1

## 2014-09-02 MED ORDER — TORSEMIDE 20 MG PO TABS: ORAL_TABLET | ORAL | Status: DC

## 2014-09-02 MED ORDER — GABAPENTIN 300 MG PO CAPS: ORAL_CAPSULE | ORAL | Status: AC

## 2014-09-02 MED ORDER — CLONAZEPAM 0.5 MG PO TABS: 0.5000 mg | ORAL_TABLET | Freq: Three times a day (TID) | ORAL | Status: DC | PRN

## 2014-09-02 MED ORDER — COLLAGENASE 250 UNIT/GM EX OINT: TOPICAL_OINTMENT | CUTANEOUS | Status: DC

## 2014-09-02 MED ORDER — DOXYCYCLINE HYCLATE 100 MG PO TABS
100.0000 mg | ORAL_TABLET | Freq: Two times a day (BID) | ORAL | Status: DC
  Administered 2014-09-02: 100 mg via ORAL
  Filled 2014-09-02 (×2): qty 1

## 2014-09-02 MED ORDER — FLUTICASONE-SALMETEROL 250-50 MCG/DOSE IN AEPB: 1.0000 | INHALATION_SPRAY | Freq: Two times a day (BID) | RESPIRATORY_TRACT | Status: DC

## 2014-09-02 NOTE — Progress Notes (Signed)
Patient continues to be irregular on the cardiac monitor. Ekg done this morning to review. Patient stable and will continue to monitor.

## 2014-09-02 NOTE — Discharge Instructions (Addendum)
Show Wife how his foot was wrapped and tell her where to get materials. If anything I need to order let me know.

## 2014-09-02 NOTE — Evaluation (Signed)
Physical Therapy Evaluation Patient Details Name: [name] MRN: [id number] DOB: 11/02/1929 Today's Date: 09/02/2014   History of Present Illness  Pt admitted with R heel ulceration.  Evaluations proceeded  incl cath and dopplers.   No osteo found in calcaneous.  It was determined that the wound would be handled conservatively.  Clinical Impression  Pt mobilized well with the RW.  Education completed including propping uplLeg an appropriate height and ambulation as appropriate to stay conditioned, but not overly stress tissues on foot until follow up.  No further PT needs,  Sign off.     Follow Up Recommendations No PT follow up    Equipment Recommendations  None recommended by PT    Recommendations for Other Services       Precautions / Restrictions Precautions Precautions: Fall      Mobility  Bed Mobility                  Transfers Overall transfer level: Needs assistance   Transfers: Sit to/from Stand Sit to Stand: Supervision         General transfer comment: Needed consistent cuing for safety.  Ambulation/Gait Ambulation/Gait assistance: Supervision Ambulation Distance (Feet): 200 Feet Assistive device: Rolling walker (2 wheeled) Gait Pattern/deviations: Step-through pattern Gait velocity: slower Gait velocity interpretation: Below normal speed for age/gender General Gait Details: steady initially becoming mildly antalgic with distance.  Stairs            Wheelchair Mobility    Modified Rankin (Stroke Patients Only)       Balance Overall balance assessment: No apparent balance deficits (not formally assessed)                                           Pertinent Vitals/Pain Pain Assessment: No/denies pain    Home Living Family/patient expects to be discharged to:: Private residence Living Arrangements: Spouse/significant other;Children Available Help at Discharge: Family Type of Home: House Home Access:  Stairs to enter Entrance Stairs-Rails: Psychiatric nurse of Steps: 4 Home Layout: One level Home Equipment: Environmental consultant - 2 wheels      Prior Function Level of Independence: Independent with assistive device(s)               Hand Dominance        Extremity/Trunk Assessment   Upper Extremity Assessment: Defer to OT evaluation           Lower Extremity Assessment: Overall WFL for tasks assessed      Cervical / Trunk Assessment: Normal  Communication      Cognition Arousal/Alertness: Awake/alert Behavior During Therapy: WFL for tasks assessed/performed Overall Cognitive Status: Within Functional Limits for tasks assessed                      General Comments General comments (skin integrity, edema, etc.): Discussed with pt and wife based on condition of his vascular system and his wound, pt really needed to prop his leg up significantly whenever not walking around and that they needed parameters from the doctor on how much walking.    Exercises        Assessment/Plan    PT Assessment Patent does not need any further PT services  PT Diagnosis     PT Problem List Decreased balance;Decreased mobility;Decreased knowledge of use of DME;Decreased knowledge of precautions  PT Treatment Interventions  PT Goals (Current goals can be found in the Care Plan section) Acute Rehab PT Goals PT Goal Formulation: No goals set, d/c therapy    Frequency     Barriers to discharge        Co-evaluation               End of Session   Activity Tolerance: Patient tolerated treatment well Patient left: in bed;with family/visitor present;with call bell/phone within reach Nurse Communication: Mobility status         Time: 8864-8472 PT Time Calculation (min): 23 min   Charges:   PT Evaluation $Initial PT Evaluation Tier I: 1 Procedure PT Treatments $Gait Training: 8-22 mins   PT G Codes:          [name], [name] 09/02/2014,  3:37 PM 09/02/2014  Donnella Sham, PT [phone]  (pager)

## 2014-09-02 NOTE — Discharge Summary (Addendum)
Physician Discharge Summary  DISCHARGE SUMMARY   Patient ID: [name] MR#: [id number] DOB/AGE: 03/03/1929 80 y.o.   Attending 71 M  Patient's KPT:WSFKC,LEXN M, MD  Consults:Treatment Team:  Rosetta Posner, MD**  Admit date: 08/28/2014 Discharge date: 09/02/2014  Discharge Diagnoses:  Active Problems:   HYPOTHYROIDISM   COPD   Chronic pain   Chronic kidney disease, stage III (moderate)   Leg swelling   Atherosclerosis of native arteries of the extremities with ulceration(440.23)   Foot ulcer   Cellulitis   PAD (peripheral artery disease)   Infected ulcer of skin   Patient Active Problem List   Diagnosis Date Noted  . Infected ulcer of skin 08/28/2014  . Foot ulcer 01/04/2013  . Cellulitis 01/04/2013  . PAD (peripheral artery disease) 01/04/2013  . Leg swelling 10/23/2012  . Atherosclerosis of native arteries of the extremities with ulceration(440.23) 10/23/2012  . Aspiration pneumonia 08/17/2012  . Toxic encephalopathy 08/17/2012  . COPD with acute exacerbation 08/17/2012  . Sacral decubitus ulcer 08/17/2012  . Chronic pain 08/17/2012  . Anxiety disorder 08/17/2012  . Chronic kidney disease, stage III (moderate) 08/17/2012  . HYPOTHYROIDISM 02/21/2008  . DYSLIPIDEMIA 02/21/2008  . ANXIETY DEPRESSION 02/21/2008  . RHEUMATIC FEVER 02/21/2008  . HYPERTENSION 02/21/2008  . COPD 02/21/2008  . ESOPHAGEAL MOTILITY DISORDER 02/21/2008  . ESOPHAGEAL DIVERTICULUM 02/21/2008  . SLEEP APNEA 02/21/2008  . ATHEROSCLEROSIS 07/11/2007  . INGUINAL HERNIAS, BILATERAL 07/11/2007  . HERNIA, UMBILICAL 17/00/1749  . CHOLELITHIASIS 07/11/2007  . UNSPECIFIED RENAL SCLEROSIS 07/11/2007  . TUBULOVILLOUS ADENOMA, COLON 11/02/2005  . GERD 11/02/2005  . DIVERTICULOSIS OF COLON 11/02/2005  . MELANOSIS COLI 11/02/2005   Past Medical History  Diagnosis Date  . Hyperlipidemia   . COPD (chronic obstructive pulmonary disease)   . Leg pain   . GERD  (gastroesophageal reflux disease)   . Depression   . Dizziness   . Productive cough   . Thyroid disease   . Peripheral vascular disease   . ED (erectile dysfunction)   . Hypertension     08/17/12 - wife denies  . Walking pneumonia 2000's  . H/O hiatal hernia   . Migraine     "when I was a young boy"  . Arthritis     "hands" (08/28/2014)  . Anxiety   . Hypothyroidism   . Chronic kidney disease (CKD), stage III (moderate)     [name] 08/28/2014    Discharged Condition:  Stable.   Discharge Medications:   Medication List         acetaminophen 325 MG tablet  Commonly known as:  TYLENOL  Take 650 mg by mouth every 6 (six) hours as needed. For pain or fever     albuterol (5 MG/ML) 0.5% nebulizer solution  Commonly known as:  PROVENTIL  Take 2.5 mg by nebulization every 6 (six) hours as needed. For shortness of breath     aspirin EC 81 MG tablet  Take 81 mg by mouth every morning.     carbidopa-levodopa 10-100 MG per tablet  Commonly known as:  SINEMET IR  Take 1 tablet by mouth every evening.     clonazePAM 0.5 MG tablet  Commonly known as:  KLONOPIN  Take 1-2 tablets (0.5-1 mg total) by mouth 3 (three) times daily as needed for anxiety (Take 1/2 tablet in the morning, 1/2 at lunch prn and 2 tablets in the evening). Patient uses 0.5 mg in the morning, and 1 mg in the evening.     collagenase  ointment  Commonly known as:  SANTYL  Apply 1 application topically daily prn to dead skin around ulcer.  Only use if instructed.     Desonide Lot-Moisturizing Crea 0.05 % Kit  Commonly known as:  DESOWEN LOT W/CETAPHIL CREAM  Use BID prn to callous to soften it.     doxycycline 100 MG capsule  Commonly known as:  VIBRAMYCIN  Take 1 capsule (100 mg total) by mouth 2 (two) times daily.     feeding supplement (ENSURE) Pudg  Take 1 Container by mouth 3 (three) times daily between meals.     fentaNYL 25 MCG/HR patch  Commonly known as:  DURAGESIC - dosed mcg/hr  Place 25 mcg  onto the skin every 3 (three) days.     Fluticasone-Salmeterol 250-50 MCG/DOSE Aepb  Commonly known as:  ADVAIR DISKUS  Inhale 1 puff into the lungs 2 (two) times daily.     gabapentin 300 MG capsule  Commonly known as:  NEURONTIN  Take 1 capsule (300 mg total) by mouth 2 (two) to 3 (three)  times daily.     HYDROcodone-acetaminophen 5-325 MG per tablet  Commonly known as:  NORCO/VICODIN  Take 1 tablet by mouth every 6 (six) hours as needed. For pain     ipratropium 0.02 % nebulizer solution  Commonly known as:  ATROVENT  Take 500 mcg by nebulization every 6 (six) hours as needed. For shortness of breath     levothyroxine 112 MCG tablet  Commonly known as:  SYNTHROID, LEVOTHROID  Take 112 mcg by mouth every morning.     Melatonin 3 MG Caps  Take 3 mg by mouth every evening.     mirtazapine 30 MG tablet  Commonly known as:  REMERON  Take 30 mg by mouth at bedtime.     mupirocin ointment 2 %  Commonly known as:  BACTROBAN  Place 1 application into the nose 2 (two) times daily.     QUEtiapine 25 MG tablet  Commonly known as:  SEROQUEL  Take 50 mg by mouth at bedtime.     sertraline 100 MG tablet  Commonly known as:  ZOLOFT  Take 100 mg by mouth every evening.     simvastatin 40 MG tablet  Commonly known as:  ZOCOR  Take 40 mg by mouth at bedtime.     torsemide 20 MG tablet  Commonly known as:  DEMADEX  Take 0.5 tablets (10 mg total) by mouth daily as needed for edema. Unfortunately he dislikes Diuretics as it makes his L Kidney hurt            Hospital Procedures: Dg Ankle 2 Views Right  08/28/2014   CLINICAL DATA:  Right ankle pain, erythema, and swelling.  EXAM: RIGHT ANKLE - 2 VIEW  COMPARISON:  None.  FINDINGS: Abnormal soft tissue swelling circumferentially around the ankle and lower calf.  2 portable views of the right ankle demonstrate considerable hindfoot and midfoot spurring along with suspected pes planus.  No talar dome or plafond defect is  identified.  Vascular calcifications noted. An 8 mm lucency along the underside of the calcaneus is probably incidental and unlikely to represent a focus of osteomyelitis.  IMPRESSION: 1. There is some lucency in the inferior calcaneus which is probably incidental, and unlikely to be from osteomyelitis. I suspect that this is more related to some adjacent posterior spurring. MRI is certainly a better weighted differentiate osteomyelitis in the foot. 2. Soft tissue swelling in the ankle, lower calf, and dorsal  foot diffusely. 3. Probable pes planus.   Electronically Signed   By: Sherryl Barters M.D.   On: 08/28/2014 18:55   Mr Ankle Right  Wo Contrast  09/01/2014   CLINICAL DATA:  Right lower extremity swelling.  Right heel ulcer  EXAM: MRI OF THE RIGHT ANKLE WITHOUT CONTRAST  TECHNIQUE: Multiplanar, multisequence MR imaging of the ankle was performed. No intravenous contrast was administered.  COMPARISON:  None.  FINDINGS: Limited exam secondary to lack of intravenous contrast. Patient refused intravenous contrast secondary to back pain.  TENDONS  Peroneal: Intact.  Posteromedial: Intact.  Anterior: Intact.  Achilles: Intact.  Plantar Fascia: Intact.  LIGAMENTS  Lateral: Intact.  Medial: Intact.  CARTILAGE  Ankle Joint: No joint effusion. No chondral defect. No dislocation.  Subtalar Joints/Sinus Tarsi: Normal subtalar joints. Normal sinus tarsi.  Bones: Mild marrow edema involving the plantar aspect of the posterior calcaneus with an overlying small skin ulcer. There is no cortical destruction. There is no drainable fluid collection. There is generalized soft tissue edema along the anterior and medial aspect of the ankle and midfoot.  IMPRESSION: 1. Posterior plantar calcaneal Mild marrow edema with overlying skin ulcer. There is no cortical destruction. This may reflect mom reactive marrow edema secondary to overlying inflammation/infection, but mild early osteomyelitis is difficult to exclude. No drainable  fluid collection to suggest an abscess. 2. Generalized soft tissue edema along the anterior and medial aspect of the ankle and midfoot. This may reflect reactive edema versus cellulitis.   Electronically Signed   By: Kathreen Devoid   On: 09/01/2014 08:48   Portable Chest 1 View  08/28/2014   CLINICAL DATA:  Shortness of breath, COPD  EXAM: PORTABLE CHEST - 1 VIEW  COMPARISON:  Portable exam 1829 hr compared to 08/21/2012  FINDINGS: Enlargement of cardiac silhouette.  Calcified tortuous aorta.  Pulmonary vascularity normal.  Bibasilar interstitial infiltrates, improved versus previous exam, could represent pulmonary fibrosis or persistent versus recurrent infection.  Upper lungs clear.  No gross pleural effusion or pneumothorax.  IMPRESSION: Enlargement of cardiac silhouette.  Bibasilar interstitial infiltrates question persistent or recurrent infection versus fibrosis.   Electronically Signed   By: Lavonia Dana M.D.   On: 08/28/2014 18:51    History of Present Illness: Admitted Thursday 9/10 from my office with very swollen RLE c R heel Ulcer/foul smelling drainage/Fevers/pain and RLE cellulitis.   Known PAD.    Hospital Course: Admitted 9/10 c Cellulitis and R Heel Ulcer. Known PAD.  Done well c IV Abx, elevation, Keep weight off the heel, Dressed, Diuretics and other conservative measures. Pain meds/narcotics adjusted  Still in pain c walking but foot looks much better.  S/p Angio c sig PAD and MRI not c/w Osteo.  On 9/15 we transitioned him to PO Abx.   S/P LE Arterial Angiogram which showed - complete occlusion of his right superficial femoral artery at the origin with reconstitution of the above-knee popliteal. He has open tibial runoff initially but has extensive small vessel disease into his foot.  MRI showed no definitive evidence of Osteo involvement of his calcaneus  X ray (-). WBC is Negative and Sed rate is moderately elevated. Sed rate on 9/15 was 65  Continue Dressings and appreciate  Wound care consult.  Dr Donnetta Hutching said - Recommend continued current treatment with elevation compression and oral antibiotics if he is discharged. Feel it would be appropriate for discharge tomorrow if indicated. I will see him in the office as scheduled in one week.  Would be a candidate for right femoral to above-knee popliteal bypass if he has progression of woundr issues.   Wound Care recommended - Barrier cream to buttocks to protect and repel moisture. Bactroban to promote moist healing to right foot wound. Float heel to reduce pressure.  Low Albumin - Protein Malnutrition - Encouraged good PO Intake.  Mild Anemia - Hbg 10.8-11.4  Mild Thrombocytopenia - 135-157 is fine. 147 on 9/15  DVT - Proph ordered and given in house CBGs 90-180. A1C 6.2. CBGs BID  CKD3 - Stable post angio. Cr 1.54  COPD - Stable. On Rxs  HTN - Fine to high. May need adjustment. Dr Philip Aspen started Cardizem 30 QID. Will look at EKG. May not need this med.   EKG was NSR c PACs.  D/C off cardizem. RLS - Sinemet  Murmur and Dyspnea (better). EF 65-70%. Mild LVH. Mod AS. Grade 1 Diastolic Dysfunction. Pulm HTN c PAP pressures 67mHg  EKG = NSR c PACs.    Day of Discharge Exam BP 176/70  Pulse 79  Temp(Src) 97.7 F (36.5 C) (Oral)  Resp 18  Ht '5\' 10"'  (1.778 m)  Wt 109.9 kg (242 lb 4.6 oz)  BMI 34.76 kg/m2  SpO2 95%  Physical Exam:  See PN this am  Discharge Labs:  Recent Labs  09/01/14 0618 09/02/14 0337  NA 141 140  K 3.7 4.1  CL 103 104  CO2 22 25  GLUCOSE 110* 110*  BUN 16 16  CREATININE 1.58* 1.54*  CALCIUM 8.3* 8.5   No results found for this basename: AST, ALT, ALKPHOS, BILITOT, PROT, ALBUMIN,  in the last 72 hours  Recent Labs  09/01/14 0618 09/02/14 0337  WBC 7.4 7.9  HGB 11.1* 10.8*  HCT 34.3* 33.4*  MCV 84.9 85.4  PLT 154 147*   No results found for this basename: CKTOTAL, CKMB, CKMBINDEX, TROPONINI,  in the last 72 hours No results found for this basename: TSH, T4TOTAL,  FREET3, T3FREE, THYROIDAB,  in the last 72 hours No results found for this basename: VITAMINB12, FOLATE, FERRITIN, TIBC, IRON, RETICCTPCT,  in the last 72 hours Lab Results  Component Value Date   INR 1.04 09/01/2014   INR 1.18 08/28/2014   INR 1.07 01/03/2013       Discharge instructions:  01-Home or Self Care Follow-up Information   Follow up with RPrecious Reel MD In 1 week.   Specialty:  Internal Medicine   Contact information:   [address] [phone]      Follow up with EARLY, TODD, MD In 1 week.   Specialty:  Vascular Surgery   Contact information:   [address] [place] [phone]       Disposition: home  Follow-up Appts: [facility]  Call for appointment. F/Up Dr Early in 1 week.  Condition on Discharge:  stable  Tests Needing Follow-up: Labs and view heel Wrap heel  Time spent in discharge (includes decision making & examination of pt): 45 min.  Signed: RPrecious Reel9/15/2015, 1:11 PM

## 2014-09-02 NOTE — Progress Notes (Signed)
. Subjective: Admitted Thursday with very swollen RLE c R heel Ulcer/foul smelling drainage/pain and RLE cellulitis. Subjective fevers at Home.  Known PAD.  Remained on IV Abx over the weekend and had pain meds adjusted  Still in pain c walking but foot looks much better.  S/p Angio c sig PAD and MRI not c/w Osteo.  Should be ready for d/c later today  No new c/o  EKG = NSR c PACs.    Objective: Vital signs in last 24 hours: Temp:  [97.2 F (36.2 C)-98.3 F (36.8 C)] 97.6 F (36.4 C) (09/15 0347) Pulse Rate:  [49-100] 100 (09/15 0347) Resp:  [14-26] 16 (09/15 0347) BP: (106-204)/(44-94) 150/91 mmHg (09/15 0347) SpO2:  [93 %-100 %] 97 % (09/15 0347) Weight:  [109.9 kg (242 lb 4.6 oz)] 109.9 kg (242 lb 4.6 oz) (09/15 0027) Weight change: -1.3 kg (-2 lb 13.9 oz) Last BM Date: 09/01/14  CBG (last 3)   Recent Labs  08/31/14 1143 08/31/14 1701 09/01/14 1801  GLUCAP 174* 157* 103*    Intake/Output from previous day:  Intake/Output Summary (Last 24 hours) at 09/02/14 0709 Last data filed at 09/02/14 0600  Gross per 24 hour  Intake 1870.63 ml  Output    204 ml  Net 1666.63 ml   09/14 0701 - 09/15 0700 In: 1870.6 [P.O.:480; I.V.:840.6; IV Piggyback:550] Out: 204 [Urine:201; Stool:3]   Physical Exam General appearance: Elderly  Eyes: no scleral icterus  Throat: oropharynx moist without erythema  Resp: distant at bases but clear.  Cardio: Reg, 3/56m  GI: Obese, soft, non-tender; bowel sounds normal; no masses, no organomegaly  Extremities: + B LE Edema but markedly less.  R heel 2 cm Superficial ulcer c surrounding callous. Leg is markedly better than on admit Venous stasis noted.  No cellulitis.  Great dressing noted Stage 1 sacral Decub being cared for.   Lab Results:  Recent Labs  09/01/14 0618 09/02/14 0337  NA 141 140  K 3.7 4.1  CL 103 104  CO2 22 25  GLUCOSE 110* 110*  BUN 16 16  CREATININE 1.58* 1.54*  CALCIUM 8.3* 8.5    No results  found for this basename: AST, ALT, ALKPHOS, BILITOT, PROT, ALBUMIN,  in the last 72 hours   Recent Labs  09/01/14 0618 09/02/14 0337  WBC 7.4 7.9  HGB 11.1* 10.8*  HCT 34.3* 33.4*  MCV 84.9 85.4  PLT 154 147*    Lab Results  Component Value Date   INR 1.04 09/01/2014   INR 1.18 08/28/2014   INR 1.07 01/03/2013    No results found for this basename: CKTOTAL, CKMB, CKMBINDEX, TROPONINI,  in the last 72 hours  No results found for this basename: TSH, T4TOTAL, FREET3, T3FREE, THYROIDAB,  in the last 72 hours  No results found for this basename: VITAMINB12, FOLATE, FERRITIN, TIBC, IRON, RETICCTPCT,  in the last 72 hours  Micro Results: Recent Results (from the past 240 hour(s))  SURGICAL PCR SCREEN     Status: None   Collection Time    09/01/14  2:33 AM      Result Value Ref Range Status   MRSA, PCR NEGATIVE  NEGATIVE Final   Staphylococcus aureus NEGATIVE  NEGATIVE Final   Comment:            The Xpert SA Assay (FDA     approved for NASAL specimens     in patients over 49 years of age),     is one component of  a comprehensive surveillance     program.  Test performance has     been validated by [facility] for patients greater     than or equal to 14 year old.     It is not intended     to diagnose infection nor to     guide or monitor treatment.     Studies/Results: Mr Ankle Right  Wo Contrast  09/01/2014   CLINICAL DATA:  Right lower extremity swelling.  Right heel ulcer  EXAM: MRI OF THE RIGHT ANKLE WITHOUT CONTRAST  TECHNIQUE: Multiplanar, multisequence MR imaging of the ankle was performed. No intravenous contrast was administered.  COMPARISON:  None.  FINDINGS: Limited exam secondary to lack of intravenous contrast. Patient refused intravenous contrast secondary to back pain.  TENDONS  Peroneal: Intact.  Posteromedial: Intact.  Anterior: Intact.  Achilles: Intact.  Plantar Fascia: Intact.  LIGAMENTS  Lateral: Intact.  Medial: Intact.  CARTILAGE  Ankle  Joint: No joint effusion. No chondral defect. No dislocation.  Subtalar Joints/Sinus Tarsi: Normal subtalar joints. Normal sinus tarsi.  Bones: Mild marrow edema involving the plantar aspect of the posterior calcaneus with an overlying small skin ulcer. There is no cortical destruction. There is no drainable fluid collection. There is generalized soft tissue edema along the anterior and medial aspect of the ankle and midfoot.  IMPRESSION: 1. Posterior plantar calcaneal Mild marrow edema with overlying skin ulcer. There is no cortical destruction. This may reflect mom reactive marrow edema secondary to overlying inflammation/infection, but mild early osteomyelitis is difficult to exclude. No drainable fluid collection to suggest an abscess. 2. Generalized soft tissue edema along the anterior and medial aspect of the ankle and midfoot. This may reflect reactive edema versus cellulitis.   Electronically Signed   By: Kathreen Devoid   On: 09/01/2014 08:48     Medications: Scheduled: . aspirin EC  81 mg Oral q morning - 10a  . atorvastatin  20 mg Oral QHS  . carbidopa-levodopa  1 tablet Oral QPM  . diltiazem  30 mg Oral 4 times per day  . enoxaparin (LOVENOX) injection  55 mg Subcutaneous Q24H  . feeding supplement (ENSURE COMPLETE)  237 mL Oral BID BM  . fentaNYL  25 mcg Transdermal Q72H  . furosemide  20 mg Intravenous Daily  . gabapentin  300 mg Oral BID  . insulin aspart  0-9 Units Subcutaneous BID WC  . levothyroxine  112 mcg Oral QAC breakfast  . magnesium chloride  1 tablet Oral BID  . mirtazapine  30 mg Oral QHS  . mometasone-formoterol  2 puff Inhalation BID  . mupirocin cream   Topical Daily  . piperacillin-tazobactam (ZOSYN)  IV  3.375 g Intravenous Q8H  . polyethylene glycol  17 g Oral Daily  . QUEtiapine  25 mg Oral QHS  . senna  1 tablet Oral BID  . senna-docusate  1 tablet Oral QHS  . sertraline  100 mg Oral QPM  . vancomycin (VANCOCIN) IVPB  1,500 mg Intravenous Q24H    Continuous: . dextrose 5 % and 0.45 % NaCl with KCl 20 mEq/L 25 mL/hr at 09/02/14 0600     Assessment/Plan: Active Problems:   HYPOTHYROIDISM   COPD   Chronic pain   Chronic kidney disease, stage III (moderate)   Leg swelling   Atherosclerosis of native arteries of the extremities with ulceration(440.23)   Foot ulcer   Cellulitis   PAD (peripheral artery disease)   Infected ulcer of  skin  Superficial R heel Ulcer c RLE cellulitis in pt c known PAD.  On IV Abx and can transition to PO. On Narcs for pain control.  Keep weight off heel S/P LE Arterial Angiogram which showed - complete occlusion of his right superficial femoral artery at the origin with reconstitution of the above-knee popliteal. He has open tibial runoff initially but has extensive small vessel disease into his foot.  MRI shows no definitive evidence of Osteo involvement of his calcaneus X ray (-). WBC is Negative and Sed rate is moderately elevated. Sed rate on 9/15 was 65 Continue Dressings and appreciate Wound care consult.   Dr Donnetta Hutching said - Recommend continued current treatment with elevation compression and oral antibiotics if he is discharged. Feel it would be appropriate for discharge tomorrow if indicated. I will see him in the office as scheduled in one week. Would be a candidate for right femoral to above-knee popliteal bypass if he has progression of woundr issues.   Low Albumin - Protein Malnutrition.  Mild Anemia - Hbg 10.8-11.4  Mild Thrombocytopenia - 135-157 is fine. 147 on 9/15 DVT - Proph ordered  CBGs 90-180. A1C 6.2. CBGs BID  CKD3 - Stable post angio.  Cr 1.54  COPD - Stable.  On Rxs HTN - Fine to high. May need adjustment.  Dr Philip Aspen started Cardizem 30 QID.  Will look at EKG.  May not need this med.  RLS - Sinemet  Murmur and Dyspnea (better). EF 65-70%. Mild LVH. Mod AS. Grade 1 Diastolic Dysfunction. Pulm HTN c PAP pressures 100mmHg  ID -  Anti-infectives   Start     Dose/Rate  Route Frequency Ordered Stop   09/02/14 0000  doxycycline (VIBRAMYCIN) 100 MG capsule     100 mg Oral 2 times daily 09/02/14 0709     09/01/14 1300  vancomycin (VANCOCIN) 1,500 mg in sodium chloride 0.9 % 500 mL IVPB     1,500 mg 250 mL/hr over 120 Minutes Intravenous Every 24 hours 09/01/14 1130     08/28/14 2100  vancomycin (VANCOCIN) 1,500 mg in sodium chloride 0.9 % 500 mL IVPB  Status:  Discontinued     1,500 mg 250 mL/hr over 120 Minutes Intravenous Every 48 hours 08/28/14 1852 09/01/14 1130   08/28/14 1845  piperacillin-tazobactam (ZOSYN) IVPB 3.375 g     3.375 g 12.5 mL/hr over 240 Minutes Intravenous Every 8 hours 08/28/14 1844     08/28/14 1830  piperacillin-tazobactam (ZOSYN) IVPB 3.375 g  Status:  Discontinued     3.375 g 100 mL/hr over 30 Minutes Intravenous 4 times per day 08/28/14 1821 08/28/14 1841   08/28/14 1830  vancomycin (VANCOCIN) IVPB 1000 mg/200 mL premix  Status:  Discontinued     1,000 mg 200 mL/hr over 60 Minutes Intravenous Every 24 hours 08/28/14 1821 08/28/14 1851      Goal is to ambulate pt.  Get moving c PT and see if he can be D/ced today vrs tomorrow.   LOS: 5 days   Deshay Blumenfeld M 09/02/2014, 7:09 AM

## 2014-09-08 ENCOUNTER — Encounter: Payer: Self-pay | Admitting: Vascular Surgery

## 2014-09-09 ENCOUNTER — Ambulatory Visit (INDEPENDENT_AMBULATORY_CARE_PROVIDER_SITE_OTHER): Payer: Medicare Other | Admitting: Vascular Surgery

## 2014-09-09 ENCOUNTER — Encounter: Payer: Self-pay | Admitting: Vascular Surgery

## 2014-09-09 VITALS — BP 110/87 | HR 97 | Temp 98.2°F | Resp 16 | Ht 73.0 in | Wt 238.0 lb

## 2014-09-09 DIAGNOSIS — I739 Peripheral vascular disease, unspecified: Secondary | ICD-10-CM

## 2014-09-09 DIAGNOSIS — L98499 Non-pressure chronic ulcer of skin of other sites with unspecified severity: Principal | ICD-10-CM

## 2014-09-09 NOTE — Progress Notes (Signed)
Here today for followup of his right heel ulcer and also for discussion of recent arteriogram while hospitalized. He had been admitted with cellulitis around the ulcer on his right heel. Worse evaluating him in the hospital amiodarone arteriogram.  Fortunately he continues to have a healing of this with no surrounding erythema.  His left groin puncture site is healing with no evidence of injury.  His arteriogram did reveal complete occlusion of the superficial femoral artery at its origin. He had extensive collaterals to the deep femoral artery with reconstitution of the above-knee popliteal artery with two-vessel runoff.   Continued improvement. I feel that he should have adequate flow for healing of this ulcer. It is somewhat of an unusual place. He does continue to have severe or bilateral lower extremity edema. I again explained to him to critical importance of elevation and compression of what is really doing either. He could have right femoral to above-knee popliteal bypass if he has progression of tissue loss but I do feel that he is adequate flow for healing. He will continue his followup with Dr. Virgina Jock and see [place] should he develop any difficulty

## 2014-10-05 ENCOUNTER — Other Ambulatory Visit: Payer: Self-pay

## 2014-10-05 ENCOUNTER — Emergency Department (HOSPITAL_COMMUNITY): Payer: Medicare Other

## 2014-10-05 ENCOUNTER — Inpatient Hospital Stay (HOSPITAL_COMMUNITY)
Admission: EM | Admit: 2014-10-05 | Discharge: 2014-10-09 | DRG: 871 | Disposition: A | Discharge status: Home Health | Payer: Medicare Other | Attending: Internal Medicine | Admitting: Internal Medicine

## 2014-10-05 ENCOUNTER — Encounter (HOSPITAL_COMMUNITY): Payer: Self-pay | Admitting: Emergency Medicine

## 2014-10-05 DIAGNOSIS — E872 Acidosis: Secondary | ICD-10-CM | POA: Diagnosis present

## 2014-10-05 DIAGNOSIS — F419 Anxiety disorder, unspecified: Secondary | ICD-10-CM | POA: Diagnosis present

## 2014-10-05 DIAGNOSIS — Z79899 Other long term (current) drug therapy: Secondary | ICD-10-CM | POA: Diagnosis not present

## 2014-10-05 DIAGNOSIS — Z8249 Family history of ischemic heart disease and other diseases of the circulatory system: Secondary | ICD-10-CM | POA: Diagnosis not present

## 2014-10-05 DIAGNOSIS — N183 Chronic kidney disease, stage 3 unspecified: Secondary | ICD-10-CM

## 2014-10-05 DIAGNOSIS — A419 Sepsis, unspecified organism: Secondary | ICD-10-CM

## 2014-10-05 DIAGNOSIS — E876 Hypokalemia: Secondary | ICD-10-CM | POA: Diagnosis present

## 2014-10-05 DIAGNOSIS — I471 Supraventricular tachycardia: Secondary | ICD-10-CM | POA: Diagnosis present

## 2014-10-05 DIAGNOSIS — E13621 Other specified diabetes mellitus with foot ulcer: Secondary | ICD-10-CM

## 2014-10-05 DIAGNOSIS — R0689 Other abnormalities of breathing: Secondary | ICD-10-CM | POA: Diagnosis present

## 2014-10-05 DIAGNOSIS — D649 Anemia, unspecified: Secondary | ICD-10-CM | POA: Diagnosis present

## 2014-10-05 DIAGNOSIS — Z9842 Cataract extraction status, left eye: Secondary | ICD-10-CM | POA: Diagnosis not present

## 2014-10-05 DIAGNOSIS — L03115 Cellulitis of right lower limb: Secondary | ICD-10-CM

## 2014-10-05 DIAGNOSIS — E8770 Fluid overload, unspecified: Secondary | ICD-10-CM | POA: Diagnosis present

## 2014-10-05 DIAGNOSIS — R0902 Hypoxemia: Secondary | ICD-10-CM | POA: Diagnosis present

## 2014-10-05 DIAGNOSIS — D696 Thrombocytopenia, unspecified: Secondary | ICD-10-CM | POA: Diagnosis present

## 2014-10-05 DIAGNOSIS — I70209 Unspecified atherosclerosis of native arteries of extremities, unspecified extremity: Secondary | ICD-10-CM

## 2014-10-05 DIAGNOSIS — Z833 Family history of diabetes mellitus: Secondary | ICD-10-CM | POA: Diagnosis not present

## 2014-10-05 DIAGNOSIS — G4710 Hypersomnia, unspecified: Secondary | ICD-10-CM | POA: Diagnosis present

## 2014-10-05 DIAGNOSIS — L97519 Non-pressure chronic ulcer of other part of right foot with unspecified severity: Secondary | ICD-10-CM

## 2014-10-05 DIAGNOSIS — I890 Lymphedema, not elsewhere classified: Secondary | ICD-10-CM | POA: Diagnosis present

## 2014-10-05 DIAGNOSIS — E669 Obesity, unspecified: Secondary | ICD-10-CM

## 2014-10-05 DIAGNOSIS — I493 Ventricular premature depolarization: Secondary | ICD-10-CM | POA: Diagnosis present

## 2014-10-05 DIAGNOSIS — E1165 Type 2 diabetes mellitus with hyperglycemia: Secondary | ICD-10-CM | POA: Diagnosis present

## 2014-10-05 DIAGNOSIS — K219 Gastro-esophageal reflux disease without esophagitis: Secondary | ICD-10-CM | POA: Diagnosis present

## 2014-10-05 DIAGNOSIS — F329 Major depressive disorder, single episode, unspecified: Secondary | ICD-10-CM | POA: Diagnosis present

## 2014-10-05 DIAGNOSIS — I350 Nonrheumatic aortic (valve) stenosis: Secondary | ICD-10-CM | POA: Diagnosis present

## 2014-10-05 DIAGNOSIS — M869 Osteomyelitis, unspecified: Secondary | ICD-10-CM | POA: Diagnosis present

## 2014-10-05 DIAGNOSIS — E46 Unspecified protein-calorie malnutrition: Secondary | ICD-10-CM | POA: Diagnosis present

## 2014-10-05 DIAGNOSIS — L89619 Pressure ulcer of right heel, unspecified stage: Secondary | ICD-10-CM | POA: Diagnosis present

## 2014-10-05 DIAGNOSIS — Z7982 Long term (current) use of aspirin: Secondary | ICD-10-CM

## 2014-10-05 DIAGNOSIS — I4891 Unspecified atrial fibrillation: Secondary | ICD-10-CM | POA: Diagnosis present

## 2014-10-05 DIAGNOSIS — I480 Paroxysmal atrial fibrillation: Secondary | ICD-10-CM

## 2014-10-05 DIAGNOSIS — E039 Hypothyroidism, unspecified: Secondary | ICD-10-CM | POA: Diagnosis present

## 2014-10-05 DIAGNOSIS — L97509 Non-pressure chronic ulcer of other part of unspecified foot with unspecified severity: Secondary | ICD-10-CM

## 2014-10-05 DIAGNOSIS — I77811 Abdominal aortic ectasia: Secondary | ICD-10-CM

## 2014-10-05 DIAGNOSIS — Z87442 Personal history of urinary calculi: Secondary | ICD-10-CM

## 2014-10-05 DIAGNOSIS — I739 Peripheral vascular disease, unspecified: Secondary | ICD-10-CM | POA: Diagnosis present

## 2014-10-05 DIAGNOSIS — I272 Other secondary pulmonary hypertension: Secondary | ICD-10-CM | POA: Diagnosis present

## 2014-10-05 DIAGNOSIS — M1990 Unspecified osteoarthritis, unspecified site: Secondary | ICD-10-CM | POA: Diagnosis present

## 2014-10-05 DIAGNOSIS — L89159 Pressure ulcer of sacral region, unspecified stage: Secondary | ICD-10-CM | POA: Diagnosis present

## 2014-10-05 DIAGNOSIS — G929 Unspecified toxic encephalopathy: Secondary | ICD-10-CM

## 2014-10-05 DIAGNOSIS — I7025 Atherosclerosis of native arteries of other extremities with ulceration: Secondary | ICD-10-CM

## 2014-10-05 DIAGNOSIS — R6521 Severe sepsis with septic shock: Secondary | ICD-10-CM | POA: Diagnosis present

## 2014-10-05 DIAGNOSIS — M86171 Other acute osteomyelitis, right ankle and foot: Secondary | ICD-10-CM

## 2014-10-05 DIAGNOSIS — L98499 Non-pressure chronic ulcer of skin of other sites with unspecified severity: Secondary | ICD-10-CM

## 2014-10-05 DIAGNOSIS — A401 Sepsis due to streptococcus, group B: Principal | ICD-10-CM

## 2014-10-05 DIAGNOSIS — Z87891 Personal history of nicotine dependence: Secondary | ICD-10-CM | POA: Diagnosis not present

## 2014-10-05 DIAGNOSIS — G2581 Restless legs syndrome: Secondary | ICD-10-CM | POA: Diagnosis present

## 2014-10-05 DIAGNOSIS — J449 Chronic obstructive pulmonary disease, unspecified: Secondary | ICD-10-CM | POA: Diagnosis present

## 2014-10-05 DIAGNOSIS — Z9841 Cataract extraction status, right eye: Secondary | ICD-10-CM | POA: Diagnosis not present

## 2014-10-05 DIAGNOSIS — I4719 Other supraventricular tachycardia: Secondary | ICD-10-CM

## 2014-10-05 DIAGNOSIS — Z881 Allergy status to other antibiotic agents status: Secondary | ICD-10-CM

## 2014-10-05 DIAGNOSIS — Z79891 Long term (current) use of opiate analgesic: Secondary | ICD-10-CM

## 2014-10-05 DIAGNOSIS — I714 Abdominal aortic aneurysm, without rupture: Secondary | ICD-10-CM | POA: Diagnosis present

## 2014-10-05 DIAGNOSIS — G92 Toxic encephalopathy: Secondary | ICD-10-CM

## 2014-10-05 DIAGNOSIS — I129 Hypertensive chronic kidney disease with stage 1 through stage 4 chronic kidney disease, or unspecified chronic kidney disease: Secondary | ICD-10-CM | POA: Diagnosis present

## 2014-10-05 DIAGNOSIS — E785 Hyperlipidemia, unspecified: Secondary | ICD-10-CM | POA: Diagnosis present

## 2014-10-05 DIAGNOSIS — L97419 Non-pressure chronic ulcer of right heel and midfoot with unspecified severity: Secondary | ICD-10-CM | POA: Diagnosis present

## 2014-10-05 DIAGNOSIS — Z6831 Body mass index (BMI) 31.0-31.9, adult: Secondary | ICD-10-CM | POA: Diagnosis not present

## 2014-10-05 DIAGNOSIS — R509 Fever, unspecified: Secondary | ICD-10-CM | POA: Diagnosis present

## 2014-10-05 DIAGNOSIS — L89152 Pressure ulcer of sacral region, stage 2: Secondary | ICD-10-CM

## 2014-10-05 DIAGNOSIS — R7881 Bacteremia: Secondary | ICD-10-CM

## 2014-10-05 DIAGNOSIS — M7989 Other specified soft tissue disorders: Secondary | ICD-10-CM

## 2014-10-05 DIAGNOSIS — E11621 Type 2 diabetes mellitus with foot ulcer: Secondary | ICD-10-CM

## 2014-10-05 DIAGNOSIS — E08621 Diabetes mellitus due to underlying condition with foot ulcer: Secondary | ICD-10-CM

## 2014-10-05 LAB — URINALYSIS, ROUTINE W REFLEX MICROSCOPIC
Bilirubin Urine: NEGATIVE
Glucose, UA: NEGATIVE mg/dL
KETONES UR: NEGATIVE mg/dL
LEUKOCYTES UA: NEGATIVE
NITRITE: NEGATIVE
PROTEIN: 100 mg/dL — AB
Specific Gravity, Urine: 1.034 — ABNORMAL HIGH (ref 1.005–1.030)
Urobilinogen, UA: 1 mg/dL (ref 0.0–1.0)
pH: 5.5 (ref 5.0–8.0)

## 2014-10-05 LAB — PHOSPHORUS: Phosphorus: 2.3 mg/dL (ref 2.3–4.6)

## 2014-10-05 LAB — URINE MICROSCOPIC-ADD ON

## 2014-10-05 LAB — CBC WITH DIFFERENTIAL/PLATELET
BASOS PCT: 0 % (ref 0–1)
Basophils Absolute: 0 10*3/uL (ref 0.0–0.1)
Eosinophils Absolute: 0 10*3/uL (ref 0.0–0.7)
Eosinophils Relative: 0 % (ref 0–5)
HCT: 39.8 % (ref 39.0–52.0)
HEMOGLOBIN: 12.7 g/dL — AB (ref 13.0–17.0)
LYMPHS ABS: 0.5 10*3/uL — AB (ref 0.7–4.0)
Lymphocytes Relative: 3 % — ABNORMAL LOW (ref 12–46)
MCH: 27 pg (ref 26.0–34.0)
MCHC: 31.9 g/dL (ref 30.0–36.0)
MCV: 84.7 fL (ref 78.0–100.0)
MONO ABS: 1 10*3/uL (ref 0.1–1.0)
MONOS PCT: 6 % (ref 3–12)
NEUTROS ABS: 15.2 10*3/uL — AB (ref 1.7–7.7)
Neutrophils Relative %: 91 % — ABNORMAL HIGH (ref 43–77)
Platelets: 158 10*3/uL (ref 150–400)
RBC: 4.7 MIL/uL (ref 4.22–5.81)
RDW: 14.7 % (ref 11.5–15.5)
WBC: 16.7 10*3/uL — ABNORMAL HIGH (ref 4.0–10.5)

## 2014-10-05 LAB — COMPREHENSIVE METABOLIC PANEL
ALBUMIN: 3 g/dL — AB (ref 3.5–5.2)
ALK PHOS: 90 U/L (ref 39–117)
ALT: 18 U/L (ref 0–53)
AST: 29 U/L (ref 0–37)
Anion gap: 18 — ABNORMAL HIGH (ref 5–15)
BILIRUBIN TOTAL: 1 mg/dL (ref 0.3–1.2)
BUN: 24 mg/dL — AB (ref 6–23)
CHLORIDE: 98 meq/L (ref 96–112)
CO2: 20 meq/L (ref 19–32)
CREATININE: 1.83 mg/dL — AB (ref 0.50–1.35)
Calcium: 8.7 mg/dL (ref 8.4–10.5)
GFR, EST AFRICAN AMERICAN: 38 mL/min — AB (ref >90)
GFR, EST NON AFRICAN AMERICAN: 32 mL/min — AB (ref >90)
GLUCOSE: 190 mg/dL — AB (ref 70–99)
POTASSIUM: 3.5 meq/L — AB (ref 3.7–5.3)
Sodium: 136 mEq/L — ABNORMAL LOW (ref 137–147)
Total Protein: 7.4 g/dL (ref 6.0–8.3)

## 2014-10-05 LAB — BLOOD GAS, ARTERIAL
ACID-BASE DEFICIT: 3.4 mmol/L — AB (ref 0.0–2.0)
Bicarbonate: 21.2 mEq/L (ref 20.0–24.0)
DRAWN BY: 11249
FIO2: 0.21 %
O2 SAT: 91.3 %
Patient temperature: 102.1
TCO2: 19.9 mmol/L (ref 0–100)
pCO2 arterial: 43.1 mmHg (ref 35.0–45.0)
pH, Arterial: 7.325 — ABNORMAL LOW (ref 7.350–7.450)
pO2, Arterial: 71.4 mmHg — ABNORMAL LOW (ref 80.0–100.0)

## 2014-10-05 LAB — PRO B NATRIURETIC PEPTIDE: PRO B NATRI PEPTIDE: 5498 pg/mL — AB (ref 0–450)

## 2014-10-05 LAB — I-STAT CG4 LACTIC ACID, ED
Lactic Acid, Venous: 2.97 mmol/L — ABNORMAL HIGH (ref 0.5–2.2)
Lactic Acid, Venous: 1.7 mmol/L (ref 0.5–2.2)

## 2014-10-05 LAB — TROPONIN I

## 2014-10-05 LAB — MAGNESIUM: Magnesium: 1 mg/dL — ABNORMAL LOW (ref 1.5–2.5)

## 2014-10-05 MED ORDER — QUETIAPINE FUMARATE 50 MG PO TABS: 50.0000 mg | ORAL_TABLET | Freq: Every day | ORAL | Status: DC

## 2014-10-05 MED ORDER — DOCUSATE SODIUM 100 MG PO CAPS
100.0000 mg | ORAL_CAPSULE | Freq: Two times a day (BID) | ORAL | Status: DC
  Administered 2014-10-06 – 2014-10-09 (×7): 100 mg via ORAL
  Filled 2014-10-05 (×8): qty 1

## 2014-10-05 MED ORDER — ONDANSETRON HCL 4 MG PO TABS: 4.0000 mg | ORAL_TABLET | Freq: Four times a day (QID) | ORAL | Status: DC | PRN

## 2014-10-05 MED ORDER — ONDANSETRON HCL 4 MG/2ML IJ SOLN
4.0000 mg | Freq: Four times a day (QID) | INTRAMUSCULAR | Status: DC | PRN
Start: 2014-10-05 — End: 2014-10-09

## 2014-10-05 MED ORDER — ALBUTEROL SULFATE (5 MG/ML) 0.5% IN NEBU: 2.5000 mg | INHALATION_SOLUTION | Freq: Four times a day (QID) | RESPIRATORY_TRACT | Status: DC | PRN

## 2014-10-05 MED ORDER — IPRATROPIUM-ALBUTEROL 0.5-2.5 (3) MG/3ML IN SOLN
3.0000 mL | Freq: Four times a day (QID) | RESPIRATORY_TRACT | Status: DC
  Administered 2014-10-05 – 2014-10-09 (×15): 3 mL via RESPIRATORY_TRACT
  Filled 2014-10-05 (×17): qty 3

## 2014-10-05 MED ORDER — LEVOTHYROXINE SODIUM 112 MCG PO TABS
112.0000 ug | ORAL_TABLET | Freq: Every morning | ORAL | Status: DC
  Administered 2014-10-06 – 2014-10-09 (×4): 112 ug via ORAL
  Filled 2014-10-05 (×5): qty 1

## 2014-10-05 MED ORDER — VANCOMYCIN HCL IN DEXTROSE 1-5 GM/200ML-% IV SOLN
1000.0000 mg | Freq: Two times a day (BID) | INTRAVENOUS | Status: DC
  Administered 2014-10-06: 1000 mg via INTRAVENOUS
  Filled 2014-10-05: qty 200

## 2014-10-05 MED ORDER — MUPIROCIN 2 % EX OINT
1.0000 "application " | TOPICAL_OINTMENT | Freq: Two times a day (BID) | CUTANEOUS | Status: DC
  Administered 2014-10-05 – 2014-10-09 (×8): 1 via NASAL
  Filled 2014-10-05 (×2): qty 22

## 2014-10-05 MED ORDER — IPRATROPIUM BROMIDE 0.02 % IN SOLN: 500.0000 ug | Freq: Four times a day (QID) | RESPIRATORY_TRACT | Status: DC

## 2014-10-05 MED ORDER — SODIUM CHLORIDE 0.9 % IV BOLUS (SEPSIS)
1000.0000 mL | INTRAVENOUS | Status: AC
  Administered 2014-10-05 (×3): 1000 mL via INTRAVENOUS

## 2014-10-05 MED ORDER — CLONAZEPAM 0.5 MG PO TABS: 0.5000 mg | ORAL_TABLET | Freq: Three times a day (TID) | ORAL | Status: DC | PRN

## 2014-10-05 MED ORDER — SERTRALINE HCL 100 MG PO TABS: 100.0000 mg | ORAL_TABLET | Freq: Every evening | ORAL | Status: DC

## 2014-10-05 MED ORDER — ENSURE PUDDING PO PUDG
1.0000 | Freq: Three times a day (TID) | ORAL | Status: DC
  Administered 2014-10-06 – 2014-10-09 (×9): 1 via ORAL
  Filled 2014-10-05 (×12): qty 1

## 2014-10-05 MED ORDER — MOMETASONE FURO-FORMOTEROL FUM 100-5 MCG/ACT IN AERO: 2.0000 | INHALATION_SPRAY | Freq: Two times a day (BID) | RESPIRATORY_TRACT | Status: DC

## 2014-10-05 MED ORDER — SIMVASTATIN 20 MG PO TABS
20.0000 mg | ORAL_TABLET | Freq: Every day | ORAL | Status: DC
  Administered 2014-10-06 – 2014-10-07 (×2): 20 mg via ORAL
  Filled 2014-10-05: qty 1
  Filled 2014-10-05: qty 2
  Filled 2014-10-05: qty 1
  Filled 2014-10-05: qty 2

## 2014-10-05 MED ORDER — PIPERACILLIN-TAZOBACTAM 3.375 G IVPB
3.3750 g | Freq: Three times a day (TID) | INTRAVENOUS | Status: DC
  Administered 2014-10-05 – 2014-10-06 (×2): 3.375 g via INTRAVENOUS
  Filled 2014-10-05: qty 50

## 2014-10-05 MED ORDER — ALBUTEROL SULFATE (2.5 MG/3ML) 0.083% IN NEBU
2.5000 mg | INHALATION_SOLUTION | RESPIRATORY_TRACT | Status: DC | PRN
  Administered 2014-10-07: 2.5 mg via RESPIRATORY_TRACT
  Filled 2014-10-05: qty 3

## 2014-10-05 MED ORDER — FAMOTIDINE 20 MG PO TABS
20.0000 mg | ORAL_TABLET | Freq: Two times a day (BID) | ORAL | Status: DC
  Administered 2014-10-06 – 2014-10-09 (×7): 20 mg via ORAL
  Filled 2014-10-05 (×8): qty 1

## 2014-10-05 MED ORDER — PIPERACILLIN-TAZOBACTAM 3.375 G IVPB 30 MIN
3.3750 g | Freq: Once | INTRAVENOUS | Status: AC
  Administered 2014-10-05: 3.375 g via INTRAVENOUS
  Filled 2014-10-05: qty 50

## 2014-10-05 MED ORDER — ENOXAPARIN SODIUM 30 MG/0.3ML ~~LOC~~ SOLN
30.0000 mg | SUBCUTANEOUS | Status: DC
  Administered 2014-10-05: 30 mg via SUBCUTANEOUS
  Filled 2014-10-05: qty 0.3

## 2014-10-05 MED ORDER — SACCHAROMYCES BOULARDII 250 MG PO CAPS
250.0000 mg | ORAL_CAPSULE | Freq: Two times a day (BID) | ORAL | Status: DC
  Administered 2014-10-06 – 2014-10-09 (×7): 250 mg via ORAL
  Filled 2014-10-05 (×9): qty 1

## 2014-10-05 MED ORDER — SODIUM CHLORIDE 0.9 % IV SOLN
INTRAVENOUS | Status: DC
  Administered 2014-10-05 – 2014-10-08 (×3): via INTRAVENOUS

## 2014-10-05 MED ORDER — MIRTAZAPINE 15 MG PO TABS: 30.0000 mg | ORAL_TABLET | Freq: Every day | ORAL | Status: DC

## 2014-10-05 MED ORDER — SODIUM CHLORIDE 0.9 % IV BOLUS (SEPSIS)
500.0000 mL | INTRAVENOUS | Status: AC
  Administered 2014-10-05: 500 mL via INTRAVENOUS

## 2014-10-05 MED ORDER — COLLAGENASE 250 UNIT/GM EX OINT
TOPICAL_OINTMENT | Freq: Every day | CUTANEOUS | Status: DC
  Administered 2014-10-05 – 2014-10-06 (×2): via TOPICAL
  Filled 2014-10-05: qty 30

## 2014-10-05 MED ORDER — VANCOMYCIN HCL IN DEXTROSE 1-5 GM/200ML-% IV SOLN
1000.0000 mg | Freq: Once | INTRAVENOUS | Status: AC
  Administered 2014-10-05: 1000 mg via INTRAVENOUS
  Filled 2014-10-05: qty 200

## 2014-10-05 MED ORDER — ASPIRIN EC 81 MG PO TBEC
81.0000 mg | DELAYED_RELEASE_TABLET | Freq: Every morning | ORAL | Status: DC
  Administered 2014-10-06 – 2014-10-09 (×4): 81 mg via ORAL
  Filled 2014-10-05 (×4): qty 1

## 2014-10-05 MED ORDER — ACETAMINOPHEN 325 MG PO TABS: 650.0000 mg | ORAL_TABLET | Freq: Four times a day (QID) | ORAL | Status: DC | PRN

## 2014-10-05 MED ORDER — GABAPENTIN 300 MG PO CAPS
300.0000 mg | ORAL_CAPSULE | Freq: Two times a day (BID) | ORAL | Status: DC
  Administered 2014-10-06 – 2014-10-09 (×7): 300 mg via ORAL
  Filled 2014-10-05 (×8): qty 1

## 2014-10-05 NOTE — ED Notes (Addendum)
Pt from home c/o right ulcer from cellulitis. He has finished antiobiotics regime. He has had fever and chills. Tylenol taken at 1300 . Fevers as high as 102

## 2014-10-05 NOTE — ED Provider Notes (Addendum)
CSN: [id number]     Arrival date & time 10/05/14  1451 History   First MD Initiated Contact with Patient 10/05/14 1549     Chief Complaint  Patient presents with  . right foot ulcer      (Consider location/radiation/quality/duration/timing/severity/associated sxs/prior Treatment) HPI Comments: Patient presents to the ER for evaluation of fever. Patient reports that he has an ulcer on the right heel that has been there for a couple of years. Wife reports that he gets better and worse over time. Recently it has been draining and he just finished a course of antibiotics. Since yesterday he is to return if fever as high as 102. He has had generalized weakness. There has been shortness of breath and decreased exercise tolerance. No chest pain. No cough. He has been taking Tylenol at home for the fever, last dose at 1 PM.   Past Medical History  Diagnosis Date  . Hyperlipidemia   . COPD (chronic obstructive pulmonary disease)   . Leg pain   . GERD (gastroesophageal reflux disease)   . Depression   . Dizziness   . Productive cough   . Thyroid disease   . Peripheral vascular disease   . ED (erectile dysfunction)   . Hypertension     08/17/12 - wife denies  . Walking pneumonia 2000's  . H/O hiatal hernia   . Migraine     "when I was a young boy"  . Arthritis     "hands" (08/28/2014)  . Anxiety   . Hypothyroidism   . Chronic kidney disease (CKD), stage III (moderate)     [name] 08/28/2014   Past Surgical History  Procedure Laterality Date  . Renal artery stent Left   . Kidney stone surgery  1989  . Lower extremity angiogram Left 03-20-2014    Dr. Tinnie Gens  . Tonsillectomy    . Cataract extraction, bilateral Bilateral   . Blepharoplasty Right    Family History  Problem Relation Age of Onset  . Other Mother     AAA  . AAA (abdominal aortic aneurysm) Mother   . Heart attack Father   . Alcohol abuse Father   . Diabetes Father   . Other Father     amputation  . Diabetes  Brother   . Coronary artery disease Brother   . Dementia Brother    History  Substance Use Topics  . Smoking status: Former Smoker -- 1.00 packs/day for 25 years    Types: Cigarettes    Quit date: 08/17/2012  . Smokeless tobacco: Never Used  . Alcohol Use: Yes     Comment: 08/28/2014 "last drink was in ~ 2012"    Review of Systems  Constitutional: Positive for fever, chills and fatigue.  Respiratory: Positive for shortness of breath.   Skin: Positive for wound.  All other systems reviewed and are negative.     Allergies  Review of patient's allergies indicates no known allergies.  Home Medications   Prior to Admission medications   Medication Sig Start Date End Date Taking? Authorizing Provider  acetaminophen (TYLENOL) 325 MG tablet Take 650 mg by mouth every 6 (six) hours as needed. For pain or fever    Historical Provider, MD  albuterol (PROVENTIL) (5 MG/ML) 0.5% nebulizer solution Take 2.5 mg by nebulization every 6 (six) hours as needed. For shortness of breath    Historical Provider, MD  aspirin EC 81 MG tablet Take 81 mg by mouth every morning.     Historical Provider, MD  carbidopa-levodopa (SINEMET) 10-100 MG per tablet Take 1 tablet by mouth every evening.     Historical Provider, MD  clonazePAM (KLONOPIN) 0.5 MG tablet Take 1-2 tablets (0.5-1 mg total) by mouth 3 (three) times daily as needed for anxiety (Take 1/2 tablet in the morning, 1/2 at lunch prn and 2 tablets in the evening). Patient uses 0.5 mg in the morning, and 1 mg in the evening. 09/02/14   [name], MD  collagenase (SANTYL) ointment Apply 1 application topically daily prn to dead skin around ulcer.  Only use if instructed. 09/02/14   [name], MD  Desonide Lot-Moisturizing Crea (DESOWEN LOT W/CETAPHIL CREAM) 0.05 % KIT Use BID prn to callous to soften it. 09/02/14   [name], MD  doxycycline (VIBRAMYCIN) 100 MG capsule Take 1 capsule (100 mg total) by mouth 2 (two) times daily. 09/02/14   [name], MD  feeding supplement (ENSURE) PUDG Take 1 Container by mouth 3 (three) times daily between meals. 08/23/12   [name], MD  fentaNYL (DURAGESIC - DOSED MCG/HR) 25 MCG/HR patch Place 25 mcg onto the skin every 3 (three) days.    Historical Provider, MD  Fluticasone-Salmeterol (ADVAIR DISKUS) 250-50 MCG/DOSE AEPB Inhale 1 puff into the lungs 2 (two) times daily. 09/02/14   [name], MD  gabapentin (NEURONTIN) 300 MG capsule Take 1 capsule (300 mg total) by mouth 2 (two) to 3 (three)  times daily. 09/02/14   [name], MD  HYDROcodone-acetaminophen (NORCO/VICODIN) 5-325 MG per tablet Take 1 tablet by mouth every 6 (six) hours as needed. For pain    Historical Provider, MD  ipratropium (ATROVENT) 0.02 % nebulizer solution Take 500 mcg by nebulization every 6 (six) hours as needed. For shortness of breath    Historical Provider, MD  levothyroxine (SYNTHROID, LEVOTHROID) 112 MCG tablet Take 112 mcg by mouth every morning.     Historical Provider, MD  Melatonin 3 MG CAPS Take 3 mg by mouth every evening.     Historical Provider, MD  mirtazapine (REMERON) 30 MG tablet Take 30 mg by mouth at bedtime.     Historical Provider, MD  mupirocin ointment (BACTROBAN) 2 % Place 1 application into the nose 2 (two) times daily. 09/02/14   [name], MD  QUEtiapine (SEROQUEL) 25 MG tablet Take 50 mg by mouth at bedtime.     Historical Provider, MD  sertraline (ZOLOFT) 100 MG tablet Take 100 mg by mouth every evening.     Historical Provider, MD  simvastatin (ZOCOR) 40 MG tablet Take 40 mg by mouth at bedtime.     Historical Provider, MD  torsemide (DEMADEX) 20 MG tablet Take 0.5 tablets (10 mg total) by mouth daily as needed for edema. Unfortunately he dislikes Diuretics as it makes his L Kidney hurt 09/02/14   [name], MD   BP 95/58  Pulse 72  Temp(Src) 100.7 F (38.2 C) (Rectal)  Resp 24  Ht 6' (1.829 m)  Wt 230 lb (104.327 kg)  BMI 31.19 kg/m2  SpO2 94% Physical Exam  Constitutional: He  is oriented to person, place, and time. He appears well-developed and well-nourished. No distress.  HENT:  Head: Normocephalic and atraumatic.  Right Ear: Hearing normal.  Left Ear: Hearing normal.  Nose: Nose normal.  Mouth/Throat: Oropharynx is clear and moist and mucous membranes are normal.  Eyes: Conjunctivae and EOM are normal. Pupils are equal, round, and reactive to light.  Neck: Normal range of  motion. Neck supple.  Cardiovascular: Regular rhythm, S1 normal and S2 normal.  Exam reveals no gallop and no friction rub.   No murmur heard. Pulmonary/Chest: Effort normal and breath sounds normal. No respiratory distress. He exhibits no tenderness.  Abdominal: Soft. Normal appearance and bowel sounds are normal. There is no hepatosplenomegaly. There is no tenderness. There is no rebound, no guarding, no tenderness at McBurney's point and negative Murphy's sign. No hernia.  Musculoskeletal: Normal range of motion.  Neurological: He is alert and oriented to person, place, and time. He has normal strength. No cranial nerve deficit or sensory deficit. Coordination normal. GCS eye subscore is 4. GCS verbal subscore is 5. GCS motor subscore is 6.  Skin: Skin is warm, dry and intact. No rash noted. No cyanosis.     Psychiatric: He has a normal mood and affect. His speech is normal and behavior is normal. Thought content normal.    ED Course  Procedures (including critical care time) Labs Review Labs Reviewed  CBC WITH DIFFERENTIAL - Abnormal; Notable for the following:    WBC 16.7 (*)    Hemoglobin 12.7 (*)    Neutrophils Relative % 91 (*)    Neutro Abs 15.2 (*)    Lymphocytes Relative 3 (*)    Lymphs Abs 0.5 (*)    All other components within normal limits  COMPREHENSIVE METABOLIC PANEL - Abnormal; Notable for the following:    Sodium 136 (*)    Potassium 3.5 (*)    Glucose, Bld 190 (*)    BUN 24 (*)    Creatinine, Ser 1.83 (*)    Albumin 3.0 (*)    GFR calc non Af Amer 32 (*)     GFR calc Af Amer 38 (*)    Anion gap 18 (*)    All other components within normal limits  URINALYSIS, ROUTINE W REFLEX MICROSCOPIC - Abnormal; Notable for the following:    Color, Urine AMBER (*)    APPearance CLOUDY (*)    Specific Gravity, Urine 1.034 (*)    Hgb urine dipstick MODERATE (*)    Protein, ur 100 (*)    All other components within normal limits  PRO B NATRIURETIC PEPTIDE - Abnormal; Notable for the following:    Pro B Natriuretic peptide (BNP) 5498.0 (*)    All other components within normal limits  URINE MICROSCOPIC-ADD ON - Abnormal; Notable for the following:    Casts HYALINE CASTS (*)    Crystals CA OXALATE CRYSTALS (*)    All other components within normal limits  MAGNESIUM - Abnormal; Notable for the following:    Magnesium 1.0 (*)    All other components within normal limits  BLOOD GAS, ARTERIAL - Abnormal; Notable for the following:    pH, Arterial 7.325 (*)    pO2, Arterial 71.4 (*)    Acid-base deficit 3.4 (*)    All other components within normal limits  I-STAT CG4 LACTIC ACID, ED - Abnormal; Notable for the following:    Lactic Acid, Venous 2.97 (*)    All other components within normal limits  CULTURE, BLOOD (ROUTINE X 2)  CULTURE, BLOOD (ROUTINE X 2)  URINE CULTURE  WOUND CULTURE  TROPONIN I  PHOSPHORUS  I-STAT CG4 LACTIC ACID, ED  I-STAT CG4 LACTIC ACID, ED    Imaging Review Dg Chest Port 1 View  10/05/2014   CLINICAL DATA:  Shortness of breath, cellulitis, COPD  EXAM: PORTABLE CHEST - 1 VIEW  COMPARISON:  08/28/2014  FINDINGS: There  is bilateral mild increased interstitial thickening. There are trace bilateral pleural effusions. There is no pneumothorax. There is stable cardiomegaly. The osseous structures are unremarkable.  IMPRESSION: Overall findings concerning for mild CHF.   Electronically Signed   By: Kathreen Devoid   On: 10/05/2014 17:22   Dg Foot Complete Right  10/05/2014   CLINICAL DATA:  Ulceration on the plantar surface of the right  heel.  EXAM: RIGHT FOOT COMPLETE - 3+ VIEW  COMPARISON:  Plain films 08/28/2014 and MRI 09/01/2014.  FINDINGS: Soft tissues of the foot are diffusely and severely swollen. Skin ulceration is seen over the plantar surface of the calcaneus. No underlying soft tissue gas collection, bony destructive change, radiopaque foreign body or periostitis is identified. No fracture or dislocation.  IMPRESSION: Skin ulceration on the heel without plain film evidence of osteomyelitis.  Severe soft tissue swelling diffusely about the foot.   Electronically Signed   By: Inge Rise M.D.   On: 10/05/2014 17:54     EKG Interpretation   Date/Time:  Sunday October 05 2014 16:29:26 EDT Ventricular Rate:  126 PR Interval:  144 QRS Duration: 141 QT Interval:  345 QTC Calculation: 499 R Axis:   -61 Text Interpretation:  Sinus tachycardia with irregular rate Right bundle  branch block Left ventricular hypertrophy Inferior infarct, old Lateral  leads are also involved No significant change since last tracing Confirmed  by POLLINA  MD, [place] [phone]) on 10/05/2014 4:50:06 PM      MDM   Final diagnoses:  None   sepsis  cellulitis  Presents to the ER for evaluation of fever and chills. Patient has a nonhealing decubitus ulcer on the posterior right heel. He has had increased drainage and there is redness and swelling surrounding the site. Patient was febrile upon arrival. He had a tachycardia and borderline hypotension. This was concerning for possible early sepsis. Patient initiated on Zosyn and vancomycin after cultures were obtained. Patient's lactate is slightly elevated, he has significant leukocytosis. Patient will require hospitalization for further management of early sepsis secondary to cellulitis and chronic nonhealing ulcer.    Orpah Greek, MD 10/05/14 Joen Laura  Orpah Greek, MD 10/05/14 [phone]

## 2014-10-05 NOTE — H&P (Signed)
[name] is an 80 y.o. male.   Chief Complaint: fever HPI: [name] is an 80 yo gentleman with a complicated medical history as noted below.  He has had a chronic right heel ulcer for several months that has been giving him on and off problems.  He reportedly just finished some outpatient antibiotics.  He presents to the ER today with fever and lethargy. No family is at the bedside and he cannot give a good history now. He states that he has been feeling worse for the last 5-6 days.  He denies any pain. He reports fatigue.  Past Medical History  Diagnosis Date  . Hyperlipidemia   . COPD (chronic obstructive pulmonary disease)   . Leg pain   . GERD (gastroesophageal reflux disease)   . Depression   . Dizziness   . Productive cough   . Thyroid disease   . Peripheral vascular disease   . ED (erectile dysfunction)   . Hypertension     08/17/12 - wife denies  . Walking pneumonia 2000's  . H/O hiatal hernia   . Migraine     "when I was a young boy"  . Arthritis     "hands" (08/28/2014)  . Anxiety   . Hypothyroidism   . Chronic kidney disease (CKD), stage III (moderate)     [name] 08/28/2014    Past Surgical History  Procedure Laterality Date  . Renal artery stent Left   . Kidney stone surgery  1989  . Lower extremity angiogram Left 03-20-2014    Dr. Tinnie Gens  . Tonsillectomy    . Cataract extraction, bilateral Bilateral   . Blepharoplasty Right     Family History  Problem Relation Age of Onset  . Other Mother     AAA  . AAA (abdominal aortic aneurysm) Mother   . Heart attack Father   . Alcohol abuse Father   . Diabetes Father   . Other Father     amputation  . Diabetes Brother   . Coronary artery disease Brother   . Dementia Brother    Social History:  reports that he quit smoking about 2 years ago. His smoking use included Cigarettes. He has a 25 pack-year smoking history. He has never used smokeless tobacco. He reports that he drinks alcohol. He reports that  he does not use illicit drugs.  Married. Wife is [name].  Allergies:  Erythromycin  Meds:  Clotrimazole cream to skin folds Tylenol as needed. Albuterol neb prn Mupirocin to nose Jock itch cream Torsemide 10 mg daily Fentanyl patch 50 mcg/hr change every 72 hrs. He denies but is confused currently Santyl oint to wound Hydrocodone q6 hrs for pain desowen ointment Gabapentin 300 mg one po tid Carbidopa levodopa 10-100 one po qhs Clonazepam  One mg  1/2 tab in a.m.  , 1/2 tab at lunch . 2 qhs remeron 30 mg qhs seroquel 50 mg qhs Simvastatin 40 mg qhs Synthroid 112 mcg daily Sertraline 100 mg daily Asa 81 mg daily Melatonin 3 mg qhs ipatropium neb q6hrs viagra as needed.    Results for orders placed during the hospital encounter of 10/05/14 (from the past 48 hour(s))  CBC WITH DIFFERENTIAL     Status: Abnormal   Collection Time    10/05/14  3:27 PM      Result Value Ref Range   WBC 16.7 (*) 4.0 - 10.5 K/uL   RBC 4.70  4.22 - 5.81 MIL/uL   Hemoglobin 12.7 (*) 13.0 - 17.0  g/dL   HCT 39.8  39.0 - 52.0 %   MCV 84.7  78.0 - 100.0 fL   MCH 27.0  26.0 - 34.0 pg   MCHC 31.9  30.0 - 36.0 g/dL   RDW 14.7  11.5 - 15.5 %   Platelets 158  150 - 400 K/uL   Neutrophils Relative % 91 (*) 43 - 77 %   Neutro Abs 15.2 (*) 1.7 - 7.7 K/uL   Lymphocytes Relative 3 (*) 12 - 46 %   Lymphs Abs 0.5 (*) 0.7 - 4.0 K/uL   Monocytes Relative 6  3 - 12 %   Monocytes Absolute 1.0  0.1 - 1.0 K/uL   Eosinophils Relative 0  0 - 5 %   Eosinophils Absolute 0.0  0.0 - 0.7 K/uL   Basophils Relative 0  0 - 1 %   Basophils Absolute 0.0  0.0 - 0.1 K/uL  COMPREHENSIVE METABOLIC PANEL     Status: Abnormal   Collection Time    10/05/14  3:27 PM      Result Value Ref Range   Sodium 136 (*) 137 - 147 mEq/L   Potassium 3.5 (*) 3.7 - 5.3 mEq/L   Chloride 98  96 - 112 mEq/L   CO2 20  19 - 32 mEq/L   Glucose, Bld 190 (*) 70 - 99 mg/dL   BUN 24 (*) 6 - 23 mg/dL   Creatinine, Ser 1.83 (*) 0.50 - 1.35 mg/dL    Calcium 8.7  8.4 - 10.5 mg/dL   Total Protein 7.4  6.0 - 8.3 g/dL   Albumin 3.0 (*) 3.5 - 5.2 g/dL   AST 29  0 - 37 U/L   ALT 18  0 - 53 U/L   Alkaline Phosphatase 90  39 - 117 U/L   Total Bilirubin 1.0  0.3 - 1.2 mg/dL   GFR calc non Af Amer 32 (*) >90 mL/min   GFR calc Af Amer 38 (*) >90 mL/min   Comment: (NOTE)     The eGFR has been calculated using the CKD EPI equation.     This calculation has not been validated in all clinical situations.     eGFR's persistently <90 mL/min signify possible Chronic Kidney     Disease.   Anion gap 18 (*) 5 - 15  PRO B NATRIURETIC PEPTIDE     Status: Abnormal   Collection Time    10/05/14  3:55 PM      Result Value Ref Range   Pro B Natriuretic peptide (BNP) 5498.0 (*) 0 - 450 pg/mL  I-STAT CG4 LACTIC ACID, ED     Status: Abnormal   Collection Time    10/05/14  4:11 PM      Result Value Ref Range   Lactic Acid, Venous 2.97 (*) 0.5 - 2.2 mmol/L  TROPONIN I     Status: None   Collection Time    10/05/14  4:19 PM      Result Value Ref Range   Troponin I <0.30  <0.30 ng/mL   Comment:            Due to the release kinetics of cTnI,     a negative result within the first hours     of the onset of symptoms does not rule out     myocardial infarction with certainty.     If myocardial infarction is still suspected,     repeat the test at appropriate intervals.  URINALYSIS, ROUTINE W REFLEX MICROSCOPIC  Status: Abnormal   Collection Time    10/05/14  5:26 PM      Result Value Ref Range   Color, Urine AMBER (*) YELLOW   Comment: BIOCHEMICALS MAY BE AFFECTED BY COLOR   APPearance CLOUDY (*) CLEAR   Specific Gravity, Urine 1.034 (*) 1.005 - 1.030   pH 5.5  5.0 - 8.0   Glucose, UA NEGATIVE  NEGATIVE mg/dL   Hgb urine dipstick MODERATE (*) NEGATIVE   Bilirubin Urine NEGATIVE  NEGATIVE   Ketones, ur NEGATIVE  NEGATIVE mg/dL   Protein, ur 100 (*) NEGATIVE mg/dL   Urobilinogen, UA 1.0  0.0 - 1.0 mg/dL   Nitrite NEGATIVE  NEGATIVE    Leukocytes, UA NEGATIVE  NEGATIVE  URINE MICROSCOPIC-ADD ON     Status: Abnormal   Collection Time    10/05/14  5:26 PM      Result Value Ref Range   WBC, UA 0-2  <3 WBC/hpf   Bacteria, UA RARE  RARE   Casts HYALINE CASTS (*) NEGATIVE   Comment: GRANULAR CAST   Crystals CA OXALATE CRYSTALS (*) NEGATIVE   Urine-Other AMORPHOUS URATES/PHOSPHATES     Dg Chest Port 1 View  10/05/2014   CLINICAL DATA:  Shortness of breath, cellulitis, COPD  EXAM: PORTABLE CHEST - 1 VIEW  COMPARISON:  08/28/2014  FINDINGS: There is bilateral mild increased interstitial thickening. There are trace bilateral pleural effusions. There is no pneumothorax. There is stable cardiomegaly. The osseous structures are unremarkable.  IMPRESSION: Overall findings concerning for mild CHF.   Electronically Signed   By: Kathreen Devoid   On: 10/05/2014 17:22   Dg Foot Complete Right  10/05/2014   CLINICAL DATA:  Ulceration on the plantar surface of the right heel.  EXAM: RIGHT FOOT COMPLETE - 3+ VIEW  COMPARISON:  Plain films 08/28/2014 and MRI 09/01/2014.  FINDINGS: Soft tissues of the foot are diffusely and severely swollen. Skin ulceration is seen over the plantar surface of the calcaneus. No underlying soft tissue gas collection, bony destructive change, radiopaque foreign body or periostitis is identified. No fracture or dislocation.  IMPRESSION: Skin ulceration on the heel without plain film evidence of osteomyelitis.  Severe soft tissue swelling diffusely about the foot.   Electronically Signed   By: Inge Rise M.D.   On: 10/05/2014 17:54    FGH:WEXHBZ obtain from patient currently  Blood pressure 95/73, pulse 135, temperature 100.7 F (38.2 C), temperature source Rectal, resp. rate 24, height 6' (1.829 m), weight 104.327 kg (230 lb), SpO2 86.00%.  age appropriate gentleman , semi-supine . he is somewhat obtunded but can be aroused and attempts to answer questions. can follow simple commands.  No jvd. No pallor, no  icterus.  Lungs are cta bilaterally anterolaterally. No wheeze or rales noted. Heart is irreg, irreg, mild tachy.  2/6 sem left and rsb noted, no rub noted. abd obese, soft, nt, nd,  3+ bilat Lower ext. Edema to the level of the knees. He has a  1..5 inch circular ulcer on bottom of right heel.  Right leg is somewhat warm and pink. Moe times 4.  Assessment/Plan 80 yo male with multiple medical problems presenting with sepsis syndrome. The source is presumed to be the right heel ulcer and associated cellulitis.   We will admit to stepdown level.  We will continue with low dose iv fluid  (bp ranging  sbp 95 to 105 now).  We will treat with vancomycin and zosyn. We will discontinue fentanyl patch.  He is a full code status.  His altered mental status is concerning and I will ask CCM to consult with [place].   Jerlyn Ly, MD 10/05/2014, 6:34 PM

## 2014-10-05 NOTE — Progress Notes (Signed)
ANTIBIOTIC CONSULT NOTE - INITIAL  Pharmacy Consult for vancomycin, Zosyn Indication: recurrent foot ulcer/cellulitis  No Known Allergies  Patient Measurements: Height: 6' (182.9 cm) Weight: 230 lb (104.327 kg) IBW/kg (Calculated) : 77.6  Vital Signs: Temp: 102.1 F (38.9 C) (10/18 1630) Temp Source: Rectal (10/18 1630) BP: 103/54 mmHg (10/18 1645) Pulse Rate: 79 (10/18 1645) Intake/Output from previous day:   Intake/Output from this shift:    Labs:  Recent Labs  10/05/14 1527  WBC 16.7*  HGB 12.7*  PLT 158  CREATININE 1.83*   Estimated Creatinine Clearance: 38.2 ml/min (by C-G formula based on Cr of 1.83). No results found for this basename: VANCOTROUGH, VANCOPEAK, VANCORANDOM, GENTTROUGH, GENTPEAK, GENTRANDOM, TOBRATROUGH, TOBRAPEAK, TOBRARND, AMIKACINPEAK, AMIKACINTROU, AMIKACIN,  in the last 72 hours   Microbiology: No results found for this or any previous visit (from the past 720 hour(s)).  Medical History: Past Medical History  Diagnosis Date  . Hyperlipidemia   . COPD (chronic obstructive pulmonary disease)   . Leg pain   . GERD (gastroesophageal reflux disease)   . Depression   . Dizziness   . Productive cough   . Thyroid disease   . Peripheral vascular disease   . ED (erectile dysfunction)   . Hypertension     08/17/12 - wife denies  . Walking pneumonia 2000's  . H/O hiatal hernia   . Migraine     "when I was a young boy"  . Arthritis     "hands" (08/28/2014)  . Anxiety   . Hypothyroidism   . Chronic kidney disease (CKD), stage III (moderate)     [name] 08/28/2014    Medications:  Scheduled:   Infusions:  . piperacillin-tazobactam    . sodium chloride 1,000 mL (10/05/14 1635)   Followed by  . sodium chloride    . vancomycin 1,000 mg (10/05/14 1635)   Assessment: 80 yo male presents to ER with right foot ulcer/cellulitis and fever. Patient has had ulcer on right heel for a couple of years, but it just started draining recently after  finishing a course of antibiotics. To start vancomycin and Zosyn per pharmacy  10/18 >> vancomycin >> 10/18 >> Zosyn >>   Tmax 102.1  WBC 16.7  Scr 1.83, CrCl N 31  Goal of Therapy:  Vancomycin trough level 15-20 mcg/ml (used higher trough goal due to recurrent ulcer/cellulitis)  Plan:  1) Vancomycin 1g IV q12 2) Zosyn 3.375g IV q8 (extended interval infusion)   Adrian Saran, PharmD, BCPS Pager [phone] 10/05/2014 4:57 PM

## 2014-10-05 NOTE — Consult Note (Signed)
PULMONARY / CRITICAL CARE MEDICINE   Name: [name] MRN: [id number] DOB: 1929-11-14    ADMISSION DATE:  10/05/2014 CONSULTATION DATE:  10/05/14  REFERRING MD :  Dr Joylene Draft  CHIEF COMPLAINT:  Fever, altered MS  INITIAL PRESENTATION: 80 yo man, former smoker, w COPD, HTN, CKD stage III, chronic R heel ulcer recently treated w abx for increased drainage. Admitted with altered MS, fever 102, sepsis presumed due to R heel cellulitis. Admitted to [facility] and PCCM consulted to assist with management.   STUDIES:  MRI R ankle 9/14 >> R heel ulcer w assoc edema, no bone destruction but inflammation w ? Early osteo. No abscess.  R foot Xray 10/18 >> no plain film evidence for osteo  SIGNIFICANT EVENTS:  PAST MEDICAL HISTORY :   has a past medical history of Hyperlipidemia; COPD (chronic obstructive pulmonary disease); Leg pain; GERD (gastroesophageal reflux disease); Depression; Dizziness; Productive cough; Thyroid disease; Peripheral vascular disease; ED (erectile dysfunction); Hypertension; Walking pneumonia (2000's); H/O hiatal hernia; Migraine; Arthritis; Anxiety; Hypothyroidism; and Chronic kidney disease (CKD), stage III (moderate).  has past surgical history that includes Renal artery stent (Left); Kidney stone surgery (1989); Lower extremity angiogram (Left, 03-20-2014); Tonsillectomy; Cataract extraction, bilateral (Bilateral); and Blepharoplasty (Right). Prior to Admission medications   Medication Sig Start Date End Date Taking? Authorizing Provider  acetaminophen (TYLENOL) 325 MG tablet Take 650 mg by mouth every 6 (six) hours as needed. For pain or fever    Historical Provider, MD  albuterol (PROVENTIL) (5 MG/ML) 0.5% nebulizer solution Take 2.5 mg by nebulization every 6 (six) hours as needed. For shortness of breath    Historical Provider, MD  aspirin EC 81 MG tablet Take 81 mg by mouth every morning.     Historical Provider, MD  carbidopa-levodopa (SINEMET) 10-100 MG per tablet  Take 1 tablet by mouth every evening.     Historical Provider, MD  clonazePAM (KLONOPIN) 0.5 MG tablet Take 1-2 tablets (0.5-1 mg total) by mouth 3 (three) times daily as needed for anxiety (Take 1/2 tablet in the morning, 1/2 at lunch prn and 2 tablets in the evening). Patient uses 0.5 mg in the morning, and 1 mg in the evening. 09/02/14   [name], MD  collagenase (SANTYL) ointment Apply 1 application topically daily prn to dead skin around ulcer.  Only use if instructed. 09/02/14   [name], MD  Desonide Lot-Moisturizing Crea (DESOWEN LOT W/CETAPHIL CREAM) 0.05 % KIT Use BID prn to callous to soften it. 09/02/14   [name], MD  doxycycline (VIBRAMYCIN) 100 MG capsule Take 1 capsule (100 mg total) by mouth 2 (two) times daily. 09/02/14   [name], MD  feeding supplement (ENSURE) PUDG Take 1 Container by mouth 3 (three) times daily between meals. 08/23/12   [name], MD  fentaNYL (DURAGESIC - DOSED MCG/HR) 25 MCG/HR patch Place 25 mcg onto the skin every 3 (three) days.    Historical Provider, MD  Fluticasone-Salmeterol (ADVAIR DISKUS) 250-50 MCG/DOSE AEPB Inhale 1 puff into the lungs 2 (two) times daily. 09/02/14   [name], MD  gabapentin (NEURONTIN) 300 MG capsule Take 1 capsule (300 mg total) by mouth 2 (two) to 3 (three)  times daily. 09/02/14   [name], MD  HYDROcodone-acetaminophen (NORCO/VICODIN) 5-325 MG per tablet Take 1 tablet by mouth every 6 (six) hours as needed. For pain    Historical Provider, MD  ipratropium (ATROVENT) 0.02 % nebulizer solution Take 500 mcg by  nebulization every 6 (six) hours as needed. For shortness of breath    Historical Provider, MD  levothyroxine (SYNTHROID, LEVOTHROID) 112 MCG tablet Take 112 mcg by mouth every morning.     Historical Provider, MD  Melatonin 3 MG CAPS Take 3 mg by mouth every evening.     Historical Provider, MD  mirtazapine (REMERON) 30 MG tablet Take 30 mg by mouth at bedtime.     Historical Provider, MD  mupirocin  ointment (BACTROBAN) 2 % Place 1 application into the nose 2 (two) times daily. 09/02/14   [name], MD  QUEtiapine (SEROQUEL) 25 MG tablet Take 50 mg by mouth at bedtime.     Historical Provider, MD  sertraline (ZOLOFT) 100 MG tablet Take 100 mg by mouth every evening.     Historical Provider, MD  simvastatin (ZOCOR) 40 MG tablet Take 40 mg by mouth at bedtime.     Historical Provider, MD  torsemide (DEMADEX) 20 MG tablet Take 0.5 tablets (10 mg total) by mouth daily as needed for edema. Unfortunately he dislikes Diuretics as it makes his L Kidney hurt 09/02/14   [name], MD   No Known Allergies  FAMILY HISTORY:  indicated that his mother is deceased. He indicated that his father is deceased. He indicated that his brother is alive.  SOCIAL HISTORY:  reports that he quit smoking about 2 years ago. His smoking use included Cigarettes. He has a 25 pack-year smoking history. He has never used smokeless tobacco. He reports that he drinks alcohol. He reports that he does not use illicit drugs.  REVIEW OF SYSTEMS:  admits to being sleepy, denies pain  SUBJECTIVE:   VITAL SIGNS: Temp:  [98.2 F (36.8 C)-102.1 F (38.9 C)] 100.7 F (38.2 C) (10/18 1830) Pulse Rate:  [72-135] 100 (10/18 1943) Resp:  [14-26] 21 (10/18 1943) BP: (94-113)/(54-83) 105/55 mmHg (10/18 1943) SpO2:  [86 %-97 %] 97 % (10/18 1943) Weight:  [104.327 kg (230 lb)] 104.327 kg (230 lb) (10/18 1630) HEMODYNAMICS:   VENTILATOR SETTINGS:   INTAKE / OUTPUT: No intake or output data in the 24 hours ending 10/05/14 2025  PHYSICAL EXAMINATION: General:  Elderly ill appearing man, NAD on RA Neuro:  Very sleepy but wakes to voice, slurred speech, moved B UE, oriented and follows commands HEENT:  Op dry, PERRL Cardiovascular:  Regular, distant no M Lungs:  Decreased at B bases but no wheezes or crackles Abdomen:  Soft, obese, NT, + BS Musculoskeletal:  2+ pitting edema pretibial areas, 3-4+ edema dorsal feet Skin:   Round erythematous ulcer base of R heel with some surrounding redness and warmth, no drainage; mild Stage 2 decub on sacrum without skin breakdown  LABS:  CBC  Recent Labs Lab 10/05/14 1527  WBC 16.7*  HGB 12.7*  HCT 39.8  PLT 158   Coag's No results found for this basename: APTT, INR,  in the last 168 hours BMET  Recent Labs Lab 10/05/14 1527  NA 136*  K 3.5*  CL 98  CO2 20  BUN 24*  CREATININE 1.83*  GLUCOSE 190*   Electrolytes  Recent Labs Lab 10/05/14 1527 10/05/14 1846  CALCIUM 8.7  --   MG  --  1.0*  PHOS  --  2.3   Sepsis Markers  Recent Labs Lab 10/05/14 1611 10/05/14 1849  LATICACIDVEN 2.97* 1.70   ABG  Recent Labs Lab 10/05/14 1919  PHART 7.325*  PCO2ART 43.1  PO2ART 71.4*   Liver Enzymes  Recent Labs  Lab 10/05/14 1527  AST 29  ALT 18  ALKPHOS 90  BILITOT 1.0  ALBUMIN 3.0*   Cardiac Enzymes  Recent Labs Lab 10/05/14 1555 10/05/14 1619  TROPONINI  --  <0.30  PROBNP 5498.0*  --    Glucose No results found for this basename: GLUCAP,  in the last 168 hours  Imaging No results found.   ASSESSMENT / PLAN:  PULMONARY A: COPD without bronchospasm  P:   - Dulera  (sub for Advair) + scheduled atrovent nebs ordered; may ned to consider changing dulera to nebs given his lethargy, may bnot be able to take effectively - pulm toilet, currently protecting airway  CARDIOVASCULAR A:  Hx HTN P:  - home torsemide held - zocor continued   RENAL A:   Acute on chronic renal insufficiency due to sepsis Mild metabolic acidosis P:   - follow BMP with volume resuscitation  GASTROINTESTINAL A:  GI prophylaxis P:   - start pepcid until he shows that he can tolerate Po diet  HEMATOLOGIC A:  DVT prophylaxis P:  - enoxaparin ordered  INFECTIOUS A:  Severe Sepsis, presumed source R heel cellulitis P:   BCx2 10/18 >>  UC 10/18 >>   Abx: pip/tazo, start date10/18, day 1/x Abx: vanco, start date10/18, day  1/x  ENDOCRINE A:  Hypothyroidism  Hyperglycemia    P:   - continue home synthroid - start SSI if glu elevated on 10/19 am BMP  NEUROLOGIC A:  Hypersomnolence and lethargy, presumed toxic-metabolic encephalopathy due to severe sepsis.  P:   RASS goal: 0 - will hold sedating meds and assess to restart in am 10/19 > remeron, seroquel, zoloft, fenatnyl patch. His sinemet has already been held. Neurontin is ordered, could consider holding as well.  - if no resolution MS with resuscitation, abx and holding meds then would check head CT   Family updated: no family available 10/18 pm   Interdisciplinary Family Meeting: none yet   TODAY'S SUMMARY:   I have personally obtained a history, examined the patient, evaluated laboratory and imaging results, formulated the assessment and plan and placed orders.   Baltazar Apo, MD, PhD 10/05/2014, 9:08 PM [facility] [phone] or if no answer [phone]

## 2014-10-06 ENCOUNTER — Inpatient Hospital Stay (HOSPITAL_COMMUNITY): Payer: Medicare Other

## 2014-10-06 DIAGNOSIS — L97419 Non-pressure chronic ulcer of right heel and midfoot with unspecified severity: Secondary | ICD-10-CM

## 2014-10-06 DIAGNOSIS — I471 Supraventricular tachycardia: Secondary | ICD-10-CM | POA: Diagnosis present

## 2014-10-06 DIAGNOSIS — L03115 Cellulitis of right lower limb: Secondary | ICD-10-CM

## 2014-10-06 DIAGNOSIS — I739 Peripheral vascular disease, unspecified: Secondary | ICD-10-CM

## 2014-10-06 DIAGNOSIS — I4891 Unspecified atrial fibrillation: Secondary | ICD-10-CM

## 2014-10-06 DIAGNOSIS — E13621 Other specified diabetes mellitus with foot ulcer: Secondary | ICD-10-CM

## 2014-10-06 LAB — CBC WITH DIFFERENTIAL/PLATELET
BASOS ABS: 0 10*3/uL (ref 0.0–0.1)
Basophils Relative: 0 % (ref 0–1)
Eosinophils Absolute: 0.2 10*3/uL (ref 0.0–0.7)
Eosinophils Relative: 2 % (ref 0–5)
HCT: 32.5 % — ABNORMAL LOW (ref 39.0–52.0)
Hemoglobin: 10.6 g/dL — ABNORMAL LOW (ref 13.0–17.0)
LYMPHS ABS: 0.8 10*3/uL (ref 0.7–4.0)
Lymphocytes Relative: 8 % — ABNORMAL LOW (ref 12–46)
MCH: 27 pg (ref 26.0–34.0)
MCHC: 32.6 g/dL (ref 30.0–36.0)
MCV: 82.7 fL (ref 78.0–100.0)
Monocytes Absolute: 0.8 10*3/uL (ref 0.1–1.0)
Monocytes Relative: 8 % (ref 3–12)
NEUTROS PCT: 82 % — AB (ref 43–77)
Neutro Abs: 8.5 10*3/uL — ABNORMAL HIGH (ref 1.7–7.7)
Platelets: 126 10*3/uL — ABNORMAL LOW (ref 150–400)
RBC: 3.93 MIL/uL — AB (ref 4.22–5.81)
RDW: 14.8 % (ref 11.5–15.5)
WBC: 10.3 10*3/uL (ref 4.0–10.5)

## 2014-10-06 LAB — COMPREHENSIVE METABOLIC PANEL
ALT: 17 U/L (ref 0–53)
AST: 25 U/L (ref 0–37)
Albumin: 2.3 g/dL — ABNORMAL LOW (ref 3.5–5.2)
Alkaline Phosphatase: 71 U/L (ref 39–117)
Anion gap: 15 (ref 5–15)
BILIRUBIN TOTAL: 0.8 mg/dL (ref 0.3–1.2)
BUN: 25 mg/dL — ABNORMAL HIGH (ref 6–23)
CALCIUM: 8.2 mg/dL — AB (ref 8.4–10.5)
CHLORIDE: 102 meq/L (ref 96–112)
CO2: 22 meq/L (ref 19–32)
CREATININE: 1.67 mg/dL — AB (ref 0.50–1.35)
GFR calc non Af Amer: 36 mL/min — ABNORMAL LOW (ref >90)
GFR, EST AFRICAN AMERICAN: 42 mL/min — AB (ref >90)
GLUCOSE: 103 mg/dL — AB (ref 70–99)
Potassium: 3.8 mEq/L (ref 3.7–5.3)
Sodium: 139 mEq/L (ref 137–147)
Total Protein: 6 g/dL (ref 6.0–8.3)

## 2014-10-06 LAB — GLUCOSE, CAPILLARY
GLUCOSE-CAPILLARY: 94 mg/dL (ref 70–99)
GLUCOSE-CAPILLARY: 181 mg/dL — AB (ref 70–99)
GLUCOSE-CAPILLARY: 130 mg/dL — AB (ref 70–99)
Glucose-Capillary: 126 mg/dL — ABNORMAL HIGH (ref 70–99)

## 2014-10-06 LAB — TROPONIN I

## 2014-10-06 LAB — MRSA PCR SCREENING: MRSA BY PCR: NEGATIVE

## 2014-10-06 LAB — TSH: TSH: 0.433 u[IU]/mL (ref 0.350–4.500)

## 2014-10-06 MED ORDER — BOOST PLUS PO LIQD
237.0000 mL | ORAL | Status: DC
  Administered 2014-10-07: 237 mL via ORAL
  Filled 2014-10-06 (×4): qty 237

## 2014-10-06 MED ORDER — POTASSIUM CHLORIDE 10 MEQ/100ML IV SOLN
10.0000 meq | INTRAVENOUS | Status: AC
  Administered 2014-10-06 (×2): 10 meq via INTRAVENOUS
  Filled 2014-10-06 (×2): qty 100

## 2014-10-06 MED ORDER — POTASSIUM CHLORIDE 10 MEQ/100ML IV SOLN
INTRAVENOUS | Status: AC
  Filled 2014-10-06: qty 100

## 2014-10-06 MED ORDER — ENOXAPARIN SODIUM 60 MG/0.6ML ~~LOC~~ SOLN
0.5000 mg/kg | SUBCUTANEOUS | Status: DC
  Administered 2014-10-06 – 2014-10-08 (×3): 55 mg via SUBCUTANEOUS
  Filled 2014-10-06 (×4): qty 0.6

## 2014-10-06 MED ORDER — OXYCODONE HCL 5 MG PO TABS
5.0000 mg | ORAL_TABLET | Freq: Four times a day (QID) | ORAL | Status: DC | PRN
  Administered 2014-10-06 – 2014-10-09 (×7): 5 mg via ORAL
  Filled 2014-10-06 (×7): qty 1

## 2014-10-06 MED ORDER — VANCOMYCIN HCL 10 G IV SOLR
1500.0000 mg | INTRAVENOUS | Status: DC
  Administered 2014-10-07: 1500 mg via INTRAVENOUS
  Filled 2014-10-06 (×2): qty 1500

## 2014-10-06 MED ORDER — MORPHINE SULFATE 2 MG/ML IJ SOLN
1.0000 mg | INTRAMUSCULAR | Status: DC | PRN
  Administered 2014-10-06 – 2014-10-09 (×13): 1 mg via INTRAVENOUS
  Filled 2014-10-06 (×14): qty 1

## 2014-10-06 MED ORDER — METRONIDAZOLE 500 MG PO TABS
500.0000 mg | ORAL_TABLET | Freq: Three times a day (TID) | ORAL | Status: DC
  Administered 2014-10-06 – 2014-10-08 (×6): 500 mg via ORAL
  Filled 2014-10-06 (×11): qty 1

## 2014-10-06 MED ORDER — DILTIAZEM HCL 100 MG IV SOLR
5.0000 mg/h | INTRAVENOUS | Status: DC
  Administered 2014-10-06: 15 mg/h via INTRAVENOUS
  Administered 2014-10-06: 5 mg/h via INTRAVENOUS
  Administered 2014-10-07 – 2014-10-08 (×5): 15 mg/h via INTRAVENOUS
  Filled 2014-10-06 (×5): qty 100

## 2014-10-06 MED ORDER — ENOXAPARIN SODIUM 40 MG/0.4ML ~~LOC~~ SOLN: 40.0000 mg | SUBCUTANEOUS | Status: DC

## 2014-10-06 MED ORDER — DILTIAZEM LOAD VIA INFUSION
10.0000 mg | Freq: Once | INTRAVENOUS | Status: AC
  Administered 2014-10-06: 10 mg via INTRAVENOUS
  Filled 2014-10-06: qty 10

## 2014-10-06 MED ORDER — CLONAZEPAM 0.5 MG PO TABS
0.5000 mg | ORAL_TABLET | Freq: Three times a day (TID) | ORAL | Status: DC | PRN
  Administered 2014-10-06 – 2014-10-09 (×5): 1 mg via ORAL
  Filled 2014-10-06 (×5): qty 2

## 2014-10-06 MED ORDER — DILTIAZEM HCL 25 MG/5ML IV SOLN: 10.0000 mg | Freq: Once | INTRAVENOUS | Status: DC

## 2014-10-06 MED ORDER — MAGNESIUM SULFATE 40 MG/ML IJ SOLN
2.0000 g | Freq: Once | INTRAMUSCULAR | Status: AC
  Administered 2014-10-06: 2 g via INTRAVENOUS
  Filled 2014-10-06: qty 50

## 2014-10-06 MED ORDER — DEXTROSE 5 % IV SOLN
2.0000 g | INTRAVENOUS | Status: DC
  Administered 2014-10-06 – 2014-10-09 (×4): 2 g via INTRAVENOUS
  Filled 2014-10-06 (×4): qty 2

## 2014-10-06 MED ORDER — INSULIN ASPART 100 UNIT/ML ~~LOC~~ SOLN
0.0000 [IU] | Freq: Three times a day (TID) | SUBCUTANEOUS | Status: DC
  Administered 2014-10-06: 2 [IU] via SUBCUTANEOUS
  Administered 2014-10-06 – 2014-10-07 (×2): 1 [IU] via SUBCUTANEOUS
  Administered 2014-10-07 (×2): 2 [IU] via SUBCUTANEOUS
  Administered 2014-10-08 (×2): 1 [IU] via SUBCUTANEOUS
  Administered 2014-10-08 – 2014-10-09 (×3): 2 [IU] via SUBCUTANEOUS

## 2014-10-06 NOTE — Progress Notes (Signed)
CRITICAL VALUE ALERT  Critical value received: +ve blood cultures, Anaerobic bottle, gram +ve cocci in chains  Date of notification:  10/06/14  Time of notification:  0628  Critical value read back:Yes.    Nurse who received alert:  Jari Favre RN  MD notified (1st page):  E-Link  Time of first page:  0630  MD notified (2nd page):  Time of second page:  Responding MD:  Dr Nelda Marseille pt on zosyn and Vanco, no new order received  Time MD responded: [phone]

## 2014-10-06 NOTE — Consult Note (Addendum)
WOC wound consult note Reason for Consult:Chronic (3 years) wounds on the right buttock and right heel, plantar aspect.  When I inquire as to who assists him in the management of his heel wound, he replies, "Dr. Virgina Jock.  The [place] is a waste." Wound type: Chronic pressure (sacrum) and neuropathic (heel) Pressure Ulcer POA: No Measurement:right heel measures 1cm x 1.5cm x 1cm and has a moist, red wound bed that is free of necrotic tissue. The sacral area presents with evidence of chronic shear and friction injuries and today has a partial thickness tissue loss.  The affected area is mor on the right side of the sacrum and measures 8cm x 6cm with a 3cm x 2cm x 0.5cm partial thickness tissue loss in the center.   Wound bed:As described above. Drainage (amount, consistency, odor) None from sacrum, scant serous from right heel Periwound:intact, clear.  Blanchable erythema at sacrum Dressing procedure/placement/frequency:I will continue barrier cream and add turning and repositioning to the care to the care of the sacrum.  Patient had previously refused to be turned this morning and this is stressed to him today.  He reluctantly agrees to this POC.  The right heel is free of necrotic tissue and I will subsequently discontinue the enzymatic debriding agent (Santyl) in favor of filling the dead space with an iodoform wick and saline dressings.  I note that an MRI has been ordered  to determine if a radiographic study is indicated to rule out the presence of osteomyelitis at this site.  A pressure redistribution chair cushion is provided for his use when OOB and a pressure redistribution heel boot is provided for his use here in acute care. [facility] nursing team will not follow, but will remain available to this patient, the nursing and medical team.  Please re-consult if needed. Thanks, Maudie Flakes, MSN, RN, [place], [place], [place] [phone])

## 2014-10-06 NOTE — Consult Note (Addendum)
Established Critical Limb Ischemia Patient  History of Present Illness  [name] is a 80 y.o. ([birth year]) male who presents with chief complaint: right heel ulcer.  This patient is well known to Dr. Donnetta Hutching for management of his bilateral peripheral arterial disease.  The patient has healed his previous left heel ulcer.  He presents this admission with altered mental status and right leg cellulitis presumed due to his chronic right heel ulcer.   The patient saw Dr. Donnetta Hutching on 09/09/14: "could have right femoral to above-knee popliteal bypass if he has progression of tissue loss but I do feel that he is adequate flow for healing."  The patient denies any rest pain.  He denies any drainage from the right heel.  He does not fever and chills at home.  The patient's treatment regimen currently included: maximal medical management and wound care.  He is not interested in proceeding with any interventions until he "talks to Dr. Donnetta Hutching."  The patient's PMH, PSH, [place], FamHx, Med, and Allergies are unchanged from 09/09/14.  On ROS today: no drainage from R foot, denies rest pain, +chronic right heel ulcer  Physical Examination  Filed Vitals:   10/06/14 0550 10/06/14 0740 10/06/14 0800 10/06/14 0924  BP: 151/82 152/78    Pulse: 113 115    Temp:   98.5 F (36.9 C)   TempSrc:   Oral   Resp: 21 22    Height:      Weight:      SpO2: 97% 100%  98%   Body mass index is 33.3 kg/(m^2).  General: A&O x 3, WDWN, ill appearing  Eyes: PERRL, EOMI  Pulmonary: Sym exp, good air movt, CTAB, no rales, rhonchi, & wheezing  Cardiac: tachy., RR, Nl S1, S2, no Murmurs, rubs or gallops  Vascular: Vessel Right Left  Radial Palpable Palpable  Brachial Faintly Palpable Faintly Palpable  Carotid Palpable, without bruit Palpable, without bruit  Aorta Somewhat enlarged pulse N/A  Femoral Faintly Palpable Faintly Palpable  Popliteal Not palpable Not palpable  PT Not Palpable Not Palpable  DP Not Palpable  Not Palpable   Gastrointestinal: soft, NTND, -G/R, - HSM, - masses, - CVAT B, somewhat pulsatile aorta  Musculoskeletal: M/S 5/5 throughout , Extremities without ischemic changes , R lower leg erythema, R leg edema 2-3+, L leg edema 1-2+; R heel ulcer clean without any pus or frankly necrotic tissue  Neurologic: Pain and light touch intact in extremities , Motor exam as listed above  Laboratory: CBC:    Component Value Date/Time   WBC 10.3 10/06/2014 0750   RBC 3.93* 10/06/2014 0750   HGB 10.6* 10/06/2014 0750   HCT 32.5* 10/06/2014 0750   PLT 126* 10/06/2014 0750   MCV 82.7 10/06/2014 0750   MCH 27.0 10/06/2014 0750   MCHC 32.6 10/06/2014 0750   RDW 14.8 10/06/2014 0750   LYMPHSABS 0.8 10/06/2014 0750   MONOABS 0.8 10/06/2014 0750   EOSABS 0.2 10/06/2014 0750   BASOSABS 0.0 10/06/2014 0750    BMP:    Component Value Date/Time   NA 139 10/06/2014 0750   K 3.8 10/06/2014 0750   CL 102 10/06/2014 0750   CO2 22 10/06/2014 0750   GLUCOSE 103* 10/06/2014 0750   BUN 25* 10/06/2014 0750   CREATININE 1.67* 10/06/2014 0750   CALCIUM 8.2* 10/06/2014 0750   GFRNONAA 36* 10/06/2014 0750   GFRAA 42* 10/06/2014 0750    Coagulation: Lab Results  Component Value Date   INR 1.04 09/01/2014  INR 1.18 08/28/2014   INR 1.07 01/03/2013   No results found for this basename: PTT   Medical Decision Making  [name] is a 80 y.o. male who presents with: RLE critical limb ischemia with chronic heel wound, known moderate to severe Left leg peripheral arterial disease, abnormally prominent aortic pulse   Pt not interested in progressing immediately with previously recommend R CFA to AK pop bypass.  Pt wants to talk with Dr. Donnetta Hutching first.  Dr. Donnetta Hutching will be back later this week.  Continue IV abx.  Wound care per Wound Care team.  Meanwhile, I would obtain BLE ABI, BLE GSV mapping, and AAA duplex (to check for AAA).  Pre-operative cardiology evaluation will also be needed  given this patient's abnormal EKG.  Thank you for allowing [place] to participate in this patient's care.  Adele Barthel, MD [facility] Office: [phone] Pager: [phone]  10/06/2014, 12:47 PM  Addendum  Preliminary ABI are consistent with very poor healing potential in R foot without revascularization.  Awaiting final non-invasive studies.  Adele Barthel, MD [facility] Office: [phone] Pager: [phone]  10/06/2014, 6:51 PM

## 2014-10-06 NOTE — Progress Notes (Signed)
ANTIBIOTIC CONSULT NOTE - FOLLOW UP  Pharmacy Consult for Vancomycin Indication: sepsis  No Known Allergies  Patient Measurements: Height: 6' (182.9 cm) Weight: 245 lb 9.5 oz (111.4 kg) IBW/kg (Calculated) : 77.6  Vital Signs: Temp: 98.5 F (36.9 C) (10/19 0800) Temp Source: Oral (10/19 0800) BP: 151/82 mmHg (10/19 0550) Pulse Rate: 113 (10/19 0550) Intake/Output from previous day: 10/18 0701 - 10/19 0700 In: 377.5 [I.V.:90; IV Piggyback:287.5] Out: 455 [Urine:455] Intake/Output from this shift:    Labs:  Recent Labs  10/05/14 1527 10/06/14 0750  WBC 16.7* 10.3  HGB 12.7* 10.6*  PLT 158 126*  CREATININE 1.83* 1.67*   Estimated Creatinine Clearance: 43.2 ml/min (by C-G formula based on Cr of 1.67). No results found for this basename: VANCOTROUGH, VANCOPEAK, VANCORANDOM, GENTTROUGH, GENTPEAK, GENTRANDOM, TOBRATROUGH, TOBRAPEAK, TOBRARND, AMIKACINPEAK, AMIKACINTROU, AMIKACIN,  in the last 72 hours    Assessment: 80 yo male presents to ER with right foot ulcer/cellulitis and fever. Patient has had ulcer on right heel for a couple of years, but it just started draining recently after finishing a course of antibiotics.  Admitted for sepsis 2/2 right heel cellulitis/DM ulcer.   Foot xray shows no evidence of osteomyelitis.  MRI ordered.  10/18 >> vancomycin >> 10/18 >> Zosyn >> 10/19 10/19 >> Ceftriaxone >> 10/19 >> Flagyl >>  Tmax: afebrile WBCs: improved to WNL Renal: AoCKD, SCr 1.83 > 1.67, CrCl ~52 ml/min (CG), ~33 ml/min (normalized)  10/18 blood x 2: 1/2 GPC in chains 10/19 repeat blood x 2: ordered 10/18 MRSA screen: neg 10/18 urine cx: sent 10/18 urine cx (2nd cx): sent 10/18 wound cx: sent  Today is day #2 Vancomycin 1g IV q12h.  ID consult d/c'd Zosyn and started Ceftriaxone 2g IV q24h and Flagyl 500 mg IV q8h for DM foot ulcer. Blood growing 1/2 GPC in chains.  Repeat blood cultures ordered.  Goal of Therapy:  Vancomycin trough level 15-20  mcg/ml Doses adjusted per renal function Eradication of infection  Plan:  1.  Adjust vancomycin to 1500 mg IV q24h per obese dosing nomogram. 2.  F/u culture results, SCr, trough level, ID recommendations.  Hershal Coria 10/06/2014,10:45 AM

## 2014-10-06 NOTE — Progress Notes (Signed)
VASCULAR LAB PRELIMINARY  ARTERIAL  ABI completed:    RIGHT    LEFT    PRESSURE WAVEFORM  PRESSURE WAVEFORM  BRACHIAL 121 Triphasic BRACHIAL 118 Triphasic  DP 79 Monophasic DP 97 Monophasic  PT 59 Monophasic PT Barely audible   GREAT TOE 32 NA GREAT TOE 40 NA    RIGHT LEFT  ABI / TBI 0.65 / 0.26 0.86 / 0.33   ABIs were technically difficult to obtain due to edema, involuntary movement of the feet, and cardiac arrthymia.  Right ABI indicates a moderate reduction in arterial flow.Left ABI indicates a mild reduction in arterial flow. Doppler waveforms bilaterally however might suggest a false elevation of pressures due to calcification. Bilateral TBIs indicate inadequate perfusion for vascular healing however my be somewhat nonconclusion due to constant involuntary trembling of the feet and cardiac arrthymia  Ericia Moxley, RVTS 10/06/2014, 4:52 PM

## 2014-10-06 NOTE — Progress Notes (Signed)
Subjective: Admitted c Sepsis from Heel Ulcer. +ve blood cultures, Anaerobic bottle, gram +ve cocci in chains - On Vanco and Zosyn. Appreciate CCM help. Last saw Dr Donnetta Hutching 9/22 and they discussed right femoral to above-knee popliteal bypass if he has progression of tissue loss.  He had LE Arteriogram 9/14 No longer obtunded. Sitting up in bed. Foot hurts if touched.     Objective: Vital signs in last 24 hours: Temp:  [97.6 F (36.4 C)-102.1 F (38.9 C)] 98.3 F (36.8 C) (10/19 0400) Pulse Rate:  [29-135] 113 (10/19 0550) Resp:  [5-26] 21 (10/19 0550) BP: (90-151)/(54-92) 151/82 mmHg (10/19 0550) SpO2:  [86 %-100 %] 97 % (10/19 0550) Weight:  [104.327 kg (230 lb)-111.4 kg (245 lb 9.5 oz)] 111.4 kg (245 lb 9.5 oz) (10/19 0400) Weight change:  Last BM Date:  (PTA)  CBG (last 3)  No results found for this basename: GLUCAP,  in the last 72 hours  Intake/Output from previous day:  Intake/Output Summary (Last 24 hours) at 10/06/14 0726 Last data filed at 10/06/14 0600  Gross per 24 hour  Intake  377.5 ml  Output    455 ml  Net  -77.5 ml   10/18 0701 - 10/19 0700 In: 377.5 [I.V.:90; IV Piggyback:287.5] Out: 485 [Urine:455]   Physical Exam  General appearance: Alert and O Eyes: no scleral icterus Throat: oropharynx moist without erythema Resp: distant, mostly clear Cardio: reg c some irreg correlates to PACs on monitor.  Murmur GI: soft, non-tender; bowel sounds normal; no masses,  no organomegaly Extremities: R heel c some cellulitis.  ++ Edema.  Ulcer c foul odor drainage   Lab Results:  Recent Labs  10/05/14 1527 10/05/14 1846  NA 136*  --   K 3.5*  --   CL 98  --   CO2 20  --   GLUCOSE 190*  --   BUN 24*  --   CREATININE 1.83*  --   CALCIUM 8.7  --   MG  --  1.0*  PHOS  --  2.3     Recent Labs  10/05/14 1527  AST 29  ALT 18  ALKPHOS 90  BILITOT 1.0  PROT 7.4  ALBUMIN 3.0*     Recent Labs  10/05/14 1527  WBC 16.7*  NEUTROABS 15.2*   HGB 12.7*  HCT 39.8  MCV 84.7  PLT 158    Lab Results  Component Value Date   INR 1.04 09/01/2014   INR 1.18 08/28/2014   INR 1.07 01/03/2013     Recent Labs  10/05/14 1619  TROPONINI <0.30    No results found for this basename: TSH, T4TOTAL, FREET3, T3FREE, THYROIDAB,  in the last 72 hours  No results found for this basename: VITAMINB12, FOLATE, FERRITIN, TIBC, IRON, RETICCTPCT,  in the last 72 hours  Micro Results: Recent Results (from the past 240 hour(s))  CULTURE, BLOOD (ROUTINE X 2)     Status: None   Collection Time    10/05/14  3:28 PM      Result Value Ref Range Status   Specimen Description BLOOD LEFT ANTECUBITAL   Final   Special Requests BOTTLES DRAWN AEROBIC AND ANAEROBIC 3CC   Final   Culture  Setup Time     Final   Value: 10/05/2014 18:50     Performed at [facility]   Culture     Final   Value: GRAM POSITIVE COCCI IN CHAINS     Note: Gram Stain Report Called to,Read Back  By and Verified With: Nancee Liter RN on 10/06/14 at 06:25 by Rise Mu     Performed at [facility]   Report Status PENDING   Incomplete  MRSA PCR SCREENING     Status: None   Collection Time    10/05/14  8:48 PM      Result Value Ref Range Status   MRSA by PCR NEGATIVE  NEGATIVE Final   Comment:            The GeneXpert MRSA Assay (FDA     approved for NASAL specimens     only), is one component of a     comprehensive MRSA colonization     surveillance program. It is not     intended to diagnose MRSA     infection nor to guide or     monitor treatment for     MRSA infections.     Studies/Results: Dg Chest Port 1 View  10/05/2014   CLINICAL DATA:  Shortness of breath, cellulitis, COPD  EXAM: PORTABLE CHEST - 1 VIEW  COMPARISON:  08/28/2014  FINDINGS: There is bilateral mild increased interstitial thickening. There are trace bilateral pleural effusions. There is no pneumothorax. There is stable cardiomegaly. The osseous structures are unremarkable.   IMPRESSION: Overall findings concerning for mild CHF.   Electronically Signed   By: Kathreen Devoid   On: 10/05/2014 17:22   Dg Foot Complete Right  10/05/2014   CLINICAL DATA:  Ulceration on the plantar surface of the right heel.  EXAM: RIGHT FOOT COMPLETE - 3+ VIEW  COMPARISON:  Plain films 08/28/2014 and MRI 09/01/2014.  FINDINGS: Soft tissues of the foot are diffusely and severely swollen. Skin ulceration is seen over the plantar surface of the calcaneus. No underlying soft tissue gas collection, bony destructive change, radiopaque foreign body or periostitis is identified. No fracture or dislocation.  IMPRESSION: Skin ulceration on the heel without plain film evidence of osteomyelitis.  Severe soft tissue swelling diffusely about the foot.   Electronically Signed   By: Inge Rise M.D.   On: 10/05/2014 17:54     Medications: Scheduled: . aspirin EC  81 mg Oral q morning - 10a  . collagenase   Topical Daily  . docusate sodium  100 mg Oral BID  . enoxaparin (LOVENOX) injection  30 mg Subcutaneous Q24H  . famotidine  20 mg Oral BID  . feeding supplement (ENSURE)  1 Container Oral TID BM  . gabapentin  300 mg Oral BID  . ipratropium-albuterol  3 mL Nebulization QID  . levothyroxine  112 mcg Oral q morning - 10a  . mupirocin ointment  1 application Nasal BID  . piperacillin-tazobactam (ZOSYN)  IV  3.375 g Intravenous Q8H  . saccharomyces boulardii  250 mg Oral BID  . simvastatin  20 mg Oral QHS  . vancomycin  1,000 mg Intravenous Q12H   Continuous: . sodium chloride 30 mL/hr at 10/05/14 2333     Assessment/Plan: Active Problems:   Sepsis  Sepsis from R Heel Wound. Fevers, Leukoctosis, Lactic Acidosis - already better/Acidosis, Soft Blood pressure, Hypoxia, hypoventilation, and obtundation.  Known PAD.  He had LE Arteriogram 9/14 = complete occlusion of his right superficial femoral artery at the origin with reconstitution of the above-knee popliteal. He has open tibial runoff  initially but has extensive small vessel disease into his foot.  MRI 9/14 showed no definitive evidence of Osteo involvement of his calcaneus. Sed rate on 9/15 was 65.  Last saw Dr  Early 9/22 and they discussed right femoral to above-knee popliteal bypass if he has progression of tissue loss.  +ve blood cultures, Anaerobic bottle, gram +ve cocci in chains - On Vanco and Zosyn.  Wound Culture P.  Repeat Foot x ray = Skin ulceration on the heel without plain film evidence of osteomyelitis. Severe soft tissue swelling diffusely about the foot.   Will continue Abx.  Offer back some prn pain control and ask Dr Donnetta Hutching to see pt and discuss Vascular surgery vrs Amputation vrs ID and several weeks IV Abx?  Hypersomnolence and lethargy, presumed toxic-metabolic encephalopathy due to severe sepsis - fentanyl off - watch for withdrawal - Has awoken this am - prn pain meds.  Sacral Decub - Barrier cream to buttocks to protect and repel moisture.  Float heel to reduce pressure.   Elevated Pro-BNP c nml Trop I.  EKG Sinus Tachy. Low Albumin - Protein Malnutrition.  Mild Anemia - Hbg 10.8- 12.7  Mild Thrombocytopenia - 135-158  DVT Proph ordered CBGs 90-180. A1C 6.2. CBGs BID  CKD3 - Cr 1.54 - 1.83 COPD - On Rxs. Pulm Toilet.  Pulm CCM HTN -  RLS - Sinemet is home med Murmur and Dyspnea (better). EF 65-70%. Mild LVH. Mod AS. Grade 1 Diastolic Dysfunction. Pulm HTN c PAP pressures 3mmHg      ID -  Anti-infectives   Start     Dose/Rate Route Frequency Ordered Stop   10/06/14 0500  vancomycin (VANCOCIN) IVPB 1000 mg/200 mL premix     1,000 mg 200 mL/hr over 60 Minutes Intravenous Every 12 hours 10/05/14 1700     10/06/14 0000  piperacillin-tazobactam (ZOSYN) IVPB 3.375 g     3.375 g 12.5 mL/hr over 240 Minutes Intravenous Every 8 hours 10/05/14 1700     10/05/14 1600  piperacillin-tazobactam (ZOSYN) IVPB 3.375 g     3.375 g 100 mL/hr over 30 Minutes Intravenous  Once 10/05/14 1556 10/05/14 1902    10/05/14 1600  vancomycin (VANCOCIN) IVPB 1000 mg/200 mL premix     1,000 mg 200 mL/hr over 60 Minutes Intravenous  Once 10/05/14 1556 10/05/14 1750       LOS: 1 day   Euline Kimbler M 10/06/2014, 7:26 AM

## 2014-10-06 NOTE — Progress Notes (Addendum)
PULMONARY / CRITICAL CARE MEDICINE   Name: [name] MRN: [id number] DOB: 24-May-1929    ADMISSION DATE:  10/05/2014 CONSULTATION DATE:  10/05/14  REFERRING MD :  Dr Joylene Draft  CHIEF COMPLAINT:  Fever, altered MS  INITIAL PRESENTATION: 80 yo man, former smoker, w COPD, HTN, CKD stage III, chronic R heel ulcer recently treated w abx for increased drainage. Admitted with altered MS, fever 102, sepsis presumed due to R heel cellulitis. Admitted to [facility] and PCCM consulted to assist with management.   STUDIES:  MRI R ankle 9/14 >> R heel ulcer w assoc edema, no bone destruction but inflammation w ? Early osteo. No abscess.  R foot Xray 10/18 >> no plain film evidence for osteo  SIGNIFICANT EVENTS: 10/19 afib with RVR 140's  SUBJECTIVE:  NAD, afib with RVR noted and confirmed with 12 lead  VITAL SIGNS: Temp:  [97.6 F (36.4 C)-102.1 F (38.9 C)] 98.5 F (36.9 C) (10/19 0800) Pulse Rate:  [29-135] 115 (10/19 0740) Resp:  [5-26] 22 (10/19 0740) BP: (90-152)/(54-92) 152/78 mmHg (10/19 0740) SpO2:  [86 %-100 %] 98 % (10/19 0924) Weight:  [230 lb (104.327 kg)-245 lb 9.5 oz (111.4 kg)] 245 lb 9.5 oz (111.4 kg) (10/19 0400) HEMODYNAMICS:   VENTILATOR SETTINGS:   INTAKE / OUTPUT:  Intake/Output Summary (Last 24 hours) at 10/06/14 1158 Last data filed at 10/06/14 0600  Gross per 24 hour  Intake  377.5 ml  Output    455 ml  Net  -77.5 ml    PHYSICAL EXAMINATION: General:  Elderly ill appearing man, NAD on RA Neuro:  Awake and follows commands. HEENT:  Op dry, PERRL, no jvd/lan Cardiovascular:  Regular, distant no M Lungs:  Decreased bases Abdomen:  Soft, obese, NT, + BS Musculoskeletal:  2+ pitting edema pretibial areas, 3-4+ edema dorsal feet poor cap fill Skin:  Round erythematous ulcer base of R heel with some surrounding redness and warmth, no drainage; mild Stage 2 decub on sacrum without skin breakdown  LABS: PULMONARY  Recent Labs Lab 10/05/14 1919  PHART  7.325*  PCO2ART 43.1  PO2ART 71.4*  HCO3 21.2  TCO2 19.9  O2SAT 91.3    CBC  Recent Labs Lab 10/05/14 1527 10/06/14 0750  HGB 12.7* 10.6*  HCT 39.8 32.5*  WBC 16.7* 10.3  PLT 158 126*    COAGULATION No results found for this basename: INR,  in the last 168 hours  CARDIAC   Recent Labs Lab 10/05/14 1619 10/06/14 1243  TROPONINI <0.30 <0.30    Recent Labs Lab 10/05/14 1555  PROBNP 5498.0*     CHEMISTRY  Recent Labs Lab 10/05/14 1527 10/05/14 1846 10/06/14 0750  NA 136*  --  139  K 3.5*  --  3.8  CL 98  --  102  CO2 20  --  22  GLUCOSE 190*  --  103*  BUN 24*  --  25*  CREATININE 1.83*  --  1.67*  CALCIUM 8.7  --  8.2*  MG  --  1.0*  --   PHOS  --  2.3  --    Estimated Creatinine Clearance: 43.2 ml/min (by C-G formula based on Cr of 1.67).   LIVER  Recent Labs Lab 10/05/14 1527 10/06/14 0750  AST 29 25  ALT 18 17  ALKPHOS 90 71  BILITOT 1.0 0.8  PROT 7.4 6.0  ALBUMIN 3.0* 2.3*     INFECTIOUS  Recent Labs Lab 10/05/14 1611 10/05/14 1849  LATICACIDVEN 2.97* 1.70  ENDOCRINE CBG (last 3)   Recent Labs  10/06/14 0808 10/06/14 1243  GLUCAP 94 181*         IMAGING x48h Dg Chest Port 1 View  10/05/2014   CLINICAL DATA:  Shortness of breath, cellulitis, COPD  EXAM: PORTABLE CHEST - 1 VIEW  COMPARISON:  08/28/2014  FINDINGS: There is bilateral mild increased interstitial thickening. There are trace bilateral pleural effusions. There is no pneumothorax. There is stable cardiomegaly. The osseous structures are unremarkable.  IMPRESSION: Overall findings concerning for mild CHF.   Electronically Signed   By: Kathreen Devoid   On: 10/05/2014 17:22   Dg Foot Complete Right  10/05/2014   CLINICAL DATA:  Ulceration on the plantar surface of the right heel.  EXAM: RIGHT FOOT COMPLETE - 3+ VIEW  COMPARISON:  Plain films 08/28/2014 and MRI 09/01/2014.  FINDINGS: Soft tissues of the foot are diffusely and severely swollen. Skin  ulceration is seen over the plantar surface of the calcaneus. No underlying soft tissue gas collection, bony destructive change, radiopaque foreign body or periostitis is identified. No fracture or dislocation.  IMPRESSION: Skin ulceration on the heel without plain film evidence of osteomyelitis.  Severe soft tissue swelling diffusely about the foot.   Electronically Signed   By: Inge Rise M.D.   On: 10/05/2014 17:54        ASSESSMENT / PLAN:  PULMONARY A: COPD without bronchospasm  P:   - Dulera  (sub for Advair) + scheduled atrovent nebs ordered; may need to consider changing dulera to nebs but now more awake - pulm toilet, currently protecting airway  CARDIOVASCULAR A:  Hx HTN      New on set Afib with RVR P:  - home torsemide held - zocor continued  - check 12 lead that confirms a fib with RVR. He will need rate control. See orders. No anticoagulation at this time.   RENAL Lab Results  Component Value Date   CREATININE 1.67* 10/06/2014   CREATININE 1.83* 10/05/2014   CREATININE 1.54* 09/02/2014    A:   Acute on chronic renal insufficiency due to sepsis Mild metabolic acidosis P:   - follow BMP with volume resuscitation  GASTROINTESTINAL A:  GI prophylaxis P:   - start pepcid until he shows that he can tolerate Po diet  HEMATOLOGIC A:  DVT prophylaxis P:  - enoxaparin ordered  INFECTIOUS A:  Severe Sepsis, presumed source R heel cellulitis P:   BCx2 10/18 >>  UC 10/18 >>   Abx: pip/tazo, start date10/18, day 2/x Abx: vanco, start date10/18, day 2/x  ENDOCRINE A:  Hypothyroidism  Hyperglycemia    P:   - continue home synthroid - start SSI if glu elevated   NEUROLOGIC A:  Hypersomnolence and lethargy, presumed toxic-metabolic encephalopathy due to severe sepsis.  Improved 10/19 P:   RASS goal: 2 - will hold sedating meds and assess to restart in am 10/19 > remeron, seroquel, zoloft, fenatnyl patch. His sinemet has already been held. Neurontin  is ordered, could consider holding as well.     Family updated: no family available 10/19 am   Interdisciplinary Family Meeting: none yet  Richardson Landry Minor ACNP Maryanna Shape PCCM Pager [phone] till 3 pm If no answer page [phone] 10/06/2014, 12:22 PM   TODAY'S  sTAFF MD SUMMARY:   PErsonally evaluated patient. He was eating lunch. Denied major complaints. However, he has New onset Afib with RVR. Start dilt drip. Hold anticoagulation at this time, may convert to SR with  treatment. PCCM will sign off; he is not on pressors, not in acidosis, not in resp distress and is mentating fine. Rest per NP   Dr. Brand Males, M.D., [facility].C.P Pulmonary and Critical Care Medicine Staff Physician [place] [facility] Pager: [phone], If no answer or between  15:00h - 7:00h: call [phone]  10/06/2014 6:10 PM

## 2014-10-06 NOTE — Progress Notes (Signed)
Pharmacist is currently reviewing prior to admission medications. Will continue to monitor.

## 2014-10-06 NOTE — Care Management Note (Signed)
    Page 1 of 1   10/06/2014     5:29:40 PM CARE MANAGEMENT NOTE 10/06/2014  Patient:  LEN, AZEEZ   Account Number:  [id number]  Date Initiated:  10/06/2014  Documentation initiated by:  Alizae Bechtel  Subjective/Objective Assessment:   pt with sepsis from foot ulcer of a know diabetic     Action/Plan:   home when stable   Anticipated DC Date:  10/09/2014   Anticipated DC Plan:  [place]  In-house referral  NA      DC Planning Services  CM consult      [facility] Choice  NA   Choice offered to / List presented to:  NA      DME agency  NA        [facility] agency  NA   Status of service:  In process, will continue to follow Medicare Important Message given?   (If response is "NO", the following Medicare IM given date fields will be blank) Date Medicare IM given:   Medicare IM given by:   Date Additional Medicare IM given:   Additional Medicare IM given by:    Discharge Disposition:    Per UR Regulation:  Reviewed for med. necessity/level of care/duration of stay  If discussed at [place] of Stay Meetings, dates discussed:    Comments:  [id number]/Aren Cherne Rosana Hoes, RN, BSN, CCM Chart reviewed. Discharge needs and patient's stay to be reviewed and followed by case manager

## 2014-10-06 NOTE — Progress Notes (Signed)
INITIAL NUTRITION ASSESSMENT  DOCUMENTATION CODES Per approved criteria  -Not Applicable   INTERVENTION: -Recommend Boost Plus once daily -Encourage intake of healthy balanced meals with adequate protein sources to assist with skin integrity; will follow up with educational needs w/pt's wife and family -Consider addition of protein supplement Pro-Stat BID as needed, each supplement provides 100 kcal, 15 gram protein -RD to continue to montior  NUTRITION DIAGNOSIS: Increased protein needs related to wound healing as evidenced by heel and sacrum pressure ulcer.   Goal: Pt to meet >/= 90% of their estimated nutrition needs    Monitor:  Total protein intake, labs, weights, skin integrity, education needs  Reason for Assessment: Consult/MST  80 y.o. male  Admitting Dx: <principal problem not specified>  ASSESSMENT: 80 yo man, former smoker, w COPD, HTN, CKD stage III, chronic R heel ulcer recently treated w abx for increased drainage. Admitted with altered MS, fever 102, sepsis presumed due to R heel cellulitis  -Pt reported a 20 lb weight loss over several month period; current weight likely affected from +3 edema of RLE and LLE -Reported that he had been eating less; but not a significant decrease from baseline appetite. Diet recall indicates pt consumes oatmeal or muffin with breakfast, and tends to snack for remainder of day. Wife assists in meal preparation-noted they consume many convenience/microwavable foods.  -Enjoys chicken, beans and fruits and vegetables. Drinks Boost supplements occassionally -Current PO intake 50%, and saved remainder of sandwich for snack -Encouraged pt consume healthy balanced meals, with emphasis on proteins for skin healing. Reviewed high protein meals and snacks; pt denied snacks but willing to consume Boost Plus once daily. Requested RD follow up with his wife for protein sources -WOC evaluation on 10/19, noted pt with stg 2 pressure ulcer on  sacrum, and neuropathic ulcer on right heel.  Height: Ht Readings from Last 1 Encounters:  10/05/14 6' (1.829 m)    Weight: Wt Readings from Last 1 Encounters:  10/06/14 245 lb 9.5 oz (111.4 kg)    Ideal Body Weight: 178 lb  % Ideal Body Weight: 138%  Wt Readings from Last 10 Encounters:  10/06/14 245 lb 9.5 oz (111.4 kg)  09/09/14 238 lb (107.956 kg)  09/02/14 242 lb 4.6 oz (109.9 kg)  09/02/14 242 lb 4.6 oz (109.9 kg)  07/29/14 244 lb (110.678 kg)  04/22/14 241 lb 4.8 oz (109.453 kg)  03/20/13 230 lb (104.327 kg)  03/20/13 230 lb (104.327 kg)  02/05/13 234 lb (106.142 kg)  01/05/13 213 lb 6.4 oz (96.798 kg)    Usual Body Weight: 240 lb  % Usual Body Weight: 102%  BMI:  Body mass index is 33.3 kg/(m^2).  Estimated Nutritional Needs: Kcal: 2000-2200 Protein: 105-120 gram Fluid: >/= 2000 ml daily  Skin: wounds noted above, WOC note on 10/19  Diet Order: Carb Control  EDUCATION NEEDS: -Education needs addressed   Intake/Output Summary (Last 24 hours) at 10/06/14 1349 Last data filed at 10/06/14 1202  Gross per 24 hour  Intake  377.5 ml  Output    456 ml  Net  -78.5 ml    Last BM: 10/17   Labs:   Recent Labs Lab 10/05/14 1527 10/05/14 1846 10/06/14 0750  NA 136*  --  139  K 3.5*  --  3.8  CL 98  --  102  CO2 20  --  22  BUN 24*  --  25*  CREATININE 1.83*  --  1.67*  CALCIUM 8.7  --  8.2*  MG  --  1.0*  --   PHOS  --  2.3  --   GLUCOSE 190*  --  103*    CBG (last 3)   Recent Labs  10/06/14 0808 10/06/14 1243  GLUCAP 94 181*    Scheduled Meds: . aspirin EC  81 mg Oral q morning - 10a  . cefTRIAXone (ROCEPHIN)  IV  2 g Intravenous Q24H   And  . metroNIDAZOLE  500 mg Oral 3 times per day  . docusate sodium  100 mg Oral BID  . enoxaparin (LOVENOX) injection  0.5 mg/kg Subcutaneous Q24H  . famotidine  20 mg Oral BID  . feeding supplement (ENSURE)  1 Container Oral TID BM  . gabapentin  300 mg Oral BID  . insulin aspart  0-9 Units  Subcutaneous TID WC  . ipratropium-albuterol  3 mL Nebulization QID  . lactose free nutrition  237 mL Oral Q24H  . levothyroxine  112 mcg Oral q morning - 10a  . mupirocin ointment  1 application Nasal BID  . saccharomyces boulardii  250 mg Oral BID  . simvastatin  20 mg Oral QHS  . [START ON 10/07/2014] vancomycin  1,500 mg Intravenous Q24H    Continuous Infusions: . sodium chloride 30 mL/hr at 10/05/14 2333  . diltiazem (CARDIZEM) infusion 7.5 mg/hr (10/06/14 1332)    Past Medical History  Diagnosis Date  . Hyperlipidemia   . COPD (chronic obstructive pulmonary disease)   . Leg pain   . GERD (gastroesophageal reflux disease)   . Depression   . Dizziness   . Productive cough   . Thyroid disease   . Peripheral vascular disease   . ED (erectile dysfunction)   . Hypertension     08/17/12 - wife denies  . Walking pneumonia 2000's  . H/O hiatal hernia   . Migraine     "when I was a young boy"  . Arthritis     "hands" (08/28/2014)  . Anxiety   . Hypothyroidism   . Chronic kidney disease (CKD), stage III (moderate)     [name] 08/28/2014    Past Surgical History  Procedure Laterality Date  . Renal artery stent Left   . Kidney stone surgery  1989  . Lower extremity angiogram Left 03-20-2014    Dr. Tinnie Gens  . Tonsillectomy    . Cataract extraction, bilateral Bilateral   . Blepharoplasty Right     Atlee Abide MS RD LDN Clinical Dietitian [phone]

## 2014-10-06 NOTE — Consult Note (Signed)
[facility] for Infectious Disease    Date of Admission:  10/05/2014  Date of Consult:  10/06/2014  Reason for Consult: Gram positive septic shock due to purulent foot ulcer Referring Physician: Dr. Virgina Jock   HPI: [name] is an 80 y.o. male with complicated medical hx including chronic smoking, CKD, PVD hx of pre-diabetes, renal artery stent placement who has had nonhealing ulcer on heel for at least 6 months. He has been seen by [facility], Dr. Donnetta Hutching after having undergone an angiogram that showed occlusion of the superficial femoral vein. He suggested that "if the patient had ongoing progression and tissue loss he could have an right femoral to above the knee popliteal bypass." Patient tells me he recently received oral antibiotics after area began draining purulent material but that this then recurred a few days after stopping the antibiotics. He had MRI of heel one month ago that failed to show osteomyelitis as did the plain films on admission. He was admitted with purulent drainage from foot ulcer, fever and septic shock now found to have gram positive cocci in pairs in 2/2 admission cultures. He has been on vancomycin and zosyn. He is now in Atrial fibrillation.   Past Medical History  Diagnosis Date  . Hyperlipidemia   . COPD (chronic obstructive pulmonary disease)   . Leg pain   . GERD (gastroesophageal reflux disease)   . Depression   . Dizziness   . Productive cough   . Thyroid disease   . Peripheral vascular disease   . ED (erectile dysfunction)   . Hypertension     08/17/12 - wife denies  . Walking pneumonia 2000's  . H/O hiatal hernia   . Migraine     "when I was a young boy"  . Arthritis     "hands" (08/28/2014)  . Anxiety   . Hypothyroidism   . Chronic kidney disease (CKD), stage III (moderate)     [name] 08/28/2014    Past Surgical History  Procedure Laterality Date  . Renal artery stent Left   . Kidney stone surgery  1989  . Lower extremity  angiogram Left 03-20-2014    Dr. Tinnie Gens  . Tonsillectomy    . Cataract extraction, bilateral Bilateral   . Blepharoplasty Right   ergies:   No Known Allergies   Medications: I have reviewed patients current medications as documented in Epic Anti-infectives   Start     Dose/Rate Route Frequency Ordered Stop   10/07/14 0600  vancomycin (VANCOCIN) 1,500 mg in sodium chloride 0.9 % 500 mL IVPB     1,500 mg 250 mL/hr over 120 Minutes Intravenous Every 24 hours 10/06/14 1057     10/06/14 1400  metroNIDAZOLE (FLAGYL) tablet 500 mg     500 mg Oral 3 times per day 10/06/14 0959     10/06/14 1200  cefTRIAXone (ROCEPHIN) 2 g in dextrose 5 % 50 mL IVPB     2 g 100 mL/hr over 30 Minutes Intravenous Every 24 hours 10/06/14 0959     10/06/14 0500  vancomycin (VANCOCIN) IVPB 1000 mg/200 mL premix  Status:  Discontinued     1,000 mg 200 mL/hr over 60 Minutes Intravenous Every 12 hours 10/05/14 1700 10/06/14 1057   10/06/14 0000  piperacillin-tazobactam (ZOSYN) IVPB 3.375 g  Status:  Discontinued     3.375 g 12.5 mL/hr over 240 Minutes Intravenous Every 8 hours 10/05/14 1700 10/06/14 0954   10/05/14 1600  piperacillin-tazobactam (ZOSYN) IVPB 3.375 g  3.375 g 100 mL/hr over 30 Minutes Intravenous  Once 10/05/14 1556 10/05/14 1902   10/05/14 1600  vancomycin (VANCOCIN) IVPB 1000 mg/200 mL premix     1,000 mg 200 mL/hr over 60 Minutes Intravenous  Once 10/05/14 1556 10/05/14 1750      Social History:  reports that he quit smoking about 2 years ago. His smoking use included Cigarettes. He has a 25 pack-year smoking history. He has never used smokeless tobacco. He reports that he drinks alcohol. He reports that he does not use illicit drugs.  Family History  Problem Relation Age of Onset  . Other Mother     AAA  . AAA (abdominal aortic aneurysm) Mother   . Heart attack Father   . Alcohol abuse Father   . Diabetes Father   . Other Father     amputation  . Diabetes Brother   .  Coronary artery disease Brother   . Dementia Brother     As in HPI and primary teams notes otherwise 12 point review of systems is negative  Blood pressure 118/88, pulse 174, temperature 98.8 F (37.1 C), temperature source Oral, resp. rate 17, height 6' (1.829 m), weight 245 lb 9.5 oz (111.4 kg), SpO2 95.00%. General: Alert and awake, oriented x3, pale HEENT: anicteric sclera,, EOMI, oropharynx clear and without exudate CVS irr regular rhythm tachycardic, II/VI murmur at PMI Chest: clear to auscultation bilaterally, no wheezing, rales or rhonchi Abdomen: soft nontender, nondistended, normal bowel sounds, Extremities/skin: edematous skin, DP not palpable  Right foot with area of erythema on dorsum near gray tissue and on ankle, posteriorly on heel is ulcer without purulent drainage at present         Left foot with ulceration as well and present for "years":        Neuro: nonfocal    Results for orders placed during the hospital encounter of 10/05/14 (from the past 48 hour(s))  CBC WITH DIFFERENTIAL     Status: Abnormal   Collection Time    10/05/14  3:27 PM      Result Value Ref Range   WBC 16.7 (*) 4.0 - 10.5 K/uL   RBC 4.70  4.22 - 5.81 MIL/uL   Hemoglobin 12.7 (*) 13.0 - 17.0 g/dL   HCT 39.8  39.0 - 52.0 %   MCV 84.7  78.0 - 100.0 fL   MCH 27.0  26.0 - 34.0 pg   MCHC 31.9  30.0 - 36.0 g/dL   RDW 14.7  11.5 - 15.5 %   Platelets 158  150 - 400 K/uL   Neutrophils Relative % 91 (*) 43 - 77 %   Neutro Abs 15.2 (*) 1.7 - 7.7 K/uL   Lymphocytes Relative 3 (*) 12 - 46 %   Lymphs Abs 0.5 (*) 0.7 - 4.0 K/uL   Monocytes Relative 6  3 - 12 %   Monocytes Absolute 1.0  0.1 - 1.0 K/uL   Eosinophils Relative 0  0 - 5 %   Eosinophils Absolute 0.0  0.0 - 0.7 K/uL   Basophils Relative 0  0 - 1 %   Basophils Absolute 0.0  0.0 - 0.1 K/uL  COMPREHENSIVE METABOLIC PANEL     Status: Abnormal   Collection Time    10/05/14  3:27 PM      Result Value Ref Range   Sodium 136 (*)  137 - 147 mEq/L   Potassium 3.5 (*) 3.7 - 5.3 mEq/L   Chloride 98  96 - 112 mEq/L  CO2 20  19 - 32 mEq/L   Glucose, Bld 190 (*) 70 - 99 mg/dL   BUN 24 (*) 6 - 23 mg/dL   Creatinine, Ser 1.83 (*) 0.50 - 1.35 mg/dL   Calcium 8.7  8.4 - 10.5 mg/dL   Total Protein 7.4  6.0 - 8.3 g/dL   Albumin 3.0 (*) 3.5 - 5.2 g/dL   AST 29  0 - 37 U/L   ALT 18  0 - 53 U/L   Alkaline Phosphatase 90  39 - 117 U/L   Total Bilirubin 1.0  0.3 - 1.2 mg/dL   GFR calc non Af Amer 32 (*) >90 mL/min   GFR calc Af Amer 38 (*) >90 mL/min   Comment: (NOTE)     The eGFR has been calculated using the CKD EPI equation.     This calculation has not been validated in all clinical situations.     eGFR's persistently <90 mL/min signify possible Chronic Kidney     Disease.   Anion gap 18 (*) 5 - 15  CULTURE, BLOOD (ROUTINE X 2)     Status: None   Collection Time    10/05/14  3:27 PM      Result Value Ref Range   Specimen Description BLOOD LEFT FOREARM     Special Requests BOTTLES DRAWN AEROBIC ONLY 3CC     Culture  Setup Time       Value: 10/05/2014 18:50     Performed at [facility]   Culture       Value: [place] IN CHAINS     Note: Gram Stain Report Called to,Read Back By and Verified With: JACLYN T. @ 10.25AM 10.19.15     Performed at [facility]   Report Status PENDING    CULTURE, BLOOD (ROUTINE X 2)     Status: None   Collection Time    10/05/14  3:28 PM      Result Value Ref Range   Specimen Description BLOOD LEFT ANTECUBITAL     Special Requests BOTTLES DRAWN AEROBIC AND ANAEROBIC 3CC     Culture  Setup Time       Value: 10/05/2014 18:50     Performed at [facility]   Culture       Value: [place] IN CHAINS     Note: Gram Stain Report Called to,Read Back By and Verified With: Nancee Liter RN on 10/06/14 at 06:25 by Rise Mu     Performed at [facility]   Report Status PENDING    PRO B NATRIURETIC PEPTIDE     Status: Abnormal   Collection  Time    10/05/14  3:55 PM      Result Value Ref Range   Pro B Natriuretic peptide (BNP) 5498.0 (*) 0 - 450 pg/mL  I-STAT CG4 LACTIC ACID, ED     Status: Abnormal   Collection Time    10/05/14  4:11 PM      Result Value Ref Range   Lactic Acid, Venous 2.97 (*) 0.5 - 2.2 mmol/L  TROPONIN I     Status: None   Collection Time    10/05/14  4:19 PM      Result Value Ref Range   Troponin I <0.30  <0.30 ng/mL   Comment:            Due to the release kinetics of cTnI,     a negative result within the first hours  of the onset of symptoms does not rule out     myocardial infarction with certainty.     If myocardial infarction is still suspected,     repeat the test at appropriate intervals.  URINALYSIS, ROUTINE W REFLEX MICROSCOPIC     Status: Abnormal   Collection Time    10/05/14  5:26 PM      Result Value Ref Range   Color, Urine AMBER (*) YELLOW   Comment: BIOCHEMICALS MAY BE AFFECTED BY COLOR   APPearance CLOUDY (*) CLEAR   Specific Gravity, Urine 1.034 (*) 1.005 - 1.030   pH 5.5  5.0 - 8.0   Glucose, UA NEGATIVE  NEGATIVE mg/dL   Hgb urine dipstick MODERATE (*) NEGATIVE   Bilirubin Urine NEGATIVE  NEGATIVE   Ketones, ur NEGATIVE  NEGATIVE mg/dL   Protein, ur 100 (*) NEGATIVE mg/dL   Urobilinogen, UA 1.0  0.0 - 1.0 mg/dL   Nitrite NEGATIVE  NEGATIVE   Leukocytes, UA NEGATIVE  NEGATIVE  URINE MICROSCOPIC-ADD ON     Status: Abnormal   Collection Time    10/05/14  5:26 PM      Result Value Ref Range   WBC, UA 0-2  <3 WBC/hpf   Bacteria, UA RARE  RARE   Casts HYALINE CASTS (*) NEGATIVE   Comment: GRANULAR CAST   Crystals CA OXALATE CRYSTALS (*) NEGATIVE   Urine-Other AMORPHOUS URATES/PHOSPHATES    WOUND CULTURE     Status: None   Collection Time    10/05/14  5:56 PM      Result Value Ref Range   Specimen Description WOUND FOOT     Special Requests NONE     Gram Stain       Value: NO WBC SEEN     NO SQUAMOUS EPITHELIAL CELLS SEEN     NO ORGANISMS SEEN     Performed  at [facility]   Culture PENDING     Report Status PENDING    PHOSPHORUS     Status: None   Collection Time    10/05/14  6:46 PM      Result Value Ref Range   Phosphorus 2.3  2.3 - 4.6 mg/dL  MAGNESIUM     Status: Abnormal   Collection Time    10/05/14  6:46 PM      Result Value Ref Range   Magnesium 1.0 (*) 1.5 - 2.5 mg/dL  I-STAT CG4 LACTIC ACID, ED     Status: None   Collection Time    10/05/14  6:49 PM      Result Value Ref Range   Lactic Acid, Venous 1.70  0.5 - 2.2 mmol/L  BLOOD GAS, ARTERIAL     Status: Abnormal   Collection Time    10/05/14  7:19 PM      Result Value Ref Range   FIO2 0.21     pH, Arterial 7.325 (*) 7.350 - 7.450   pCO2 arterial 43.1  35.0 - 45.0 mmHg   pO2, Arterial 71.4 (*) 80.0 - 100.0 mmHg   Bicarbonate 21.2  20.0 - 24.0 mEq/L   TCO2 19.9  0 - 100 mmol/L   Acid-base deficit 3.4 (*) 0.0 - 2.0 mmol/L   O2 Saturation 91.3     Patient temperature 102.1     Collection site LEFT RADIAL     Drawn by [id number]     Sample type ARTERIAL DRAW     Allens test (pass/fail) PASS  PASS  MRSA PCR SCREENING  Status: None   Collection Time    10/05/14  8:48 PM      Result Value Ref Range   MRSA by PCR NEGATIVE  NEGATIVE   Comment:            The GeneXpert MRSA Assay (FDA     approved for NASAL specimens     only), is one component of a     comprehensive MRSA colonization     surveillance program. It is not     intended to diagnose MRSA     infection nor to guide or     monitor treatment for     MRSA infections.  COMPREHENSIVE METABOLIC PANEL     Status: Abnormal   Collection Time    10/06/14  7:50 AM      Result Value Ref Range   Sodium 139  137 - 147 mEq/L   Potassium 3.8  3.7 - 5.3 mEq/L   Chloride 102  96 - 112 mEq/L   CO2 22  19 - 32 mEq/L   Glucose, Bld 103 (*) 70 - 99 mg/dL   BUN 25 (*) 6 - 23 mg/dL   Creatinine, Ser 1.67 (*) 0.50 - 1.35 mg/dL   Calcium 8.2 (*) 8.4 - 10.5 mg/dL   Total Protein 6.0  6.0 - 8.3 g/dL   Albumin 2.3 (*)  3.5 - 5.2 g/dL   AST 25  0 - 37 U/L   ALT 17  0 - 53 U/L   Alkaline Phosphatase 71  39 - 117 U/L   Total Bilirubin 0.8  0.3 - 1.2 mg/dL   GFR calc non Af Amer 36 (*) >90 mL/min   GFR calc Af Amer 42 (*) >90 mL/min   Comment: (NOTE)     The eGFR has been calculated using the CKD EPI equation.     This calculation has not been validated in all clinical situations.     eGFR's persistently <90 mL/min signify possible Chronic Kidney     Disease.   Anion gap 15  5 - 15  CBC WITH DIFFERENTIAL     Status: Abnormal   Collection Time    10/06/14  7:50 AM      Result Value Ref Range   WBC 10.3  4.0 - 10.5 K/uL   RBC 3.93 (*) 4.22 - 5.81 MIL/uL   Hemoglobin 10.6 (*) 13.0 - 17.0 g/dL   HCT 32.5 (*) 39.0 - 52.0 %   MCV 82.7  78.0 - 100.0 fL   MCH 27.0  26.0 - 34.0 pg   MCHC 32.6  30.0 - 36.0 g/dL   RDW 14.8  11.5 - 15.5 %   Platelets 126 (*) 150 - 400 K/uL   Neutrophils Relative % 82 (*) 43 - 77 %   Neutro Abs 8.5 (*) 1.7 - 7.7 K/uL   Lymphocytes Relative 8 (*) 12 - 46 %   Lymphs Abs 0.8  0.7 - 4.0 K/uL   Monocytes Relative 8  3 - 12 %   Monocytes Absolute 0.8  0.1 - 1.0 K/uL   Eosinophils Relative 2  0 - 5 %   Eosinophils Absolute 0.2  0.0 - 0.7 K/uL   Basophils Relative 0  0 - 1 %   Basophils Absolute 0.0  0.0 - 0.1 K/uL  TSH     Status: None   Collection Time    10/06/14  7:50 AM      Result Value Ref Range   TSH 0.433  0.350 -  4.500 uIU/mL   Comment: Performed at [place], CAP[facility]     Status: None   Collection Time    10/06/14  8:08 AM      Result Value Ref Range   Glucose-Capillary 94  70 - 99 mg/dL   Comment 1 Documented in Chart     Comment 2 Notify RN    TROPONIN I     Status: None   Collection Time    10/06/14 12:43 PM      Result Value Ref Range   Troponin I <0.30  <0.30 ng/mL   Comment:            Due to the release kinetics of cTnI,     a negative result within the first hours     of the onset of symptoms does not rule out     myocardial  infarction with certainty.     If myocardial infarction is still suspected,     repeat the test at appropriate intervals.  GLUCOSE, CAPILLARY     Status: Abnormal   Collection Time    10/06/14 12:43 PM      Result Value Ref Range   Glucose-Capillary 181 (*) 70 - 99 mg/dL   Comment 1 Documented in Chart     Comment 2 Notify RN     '@BRIEFLABTABLE' (sdes,specrequest,cult,reptstatus)   ) Recent Results (from the past 720 hour(s))  CULTURE, BLOOD (ROUTINE X 2)     Status: None   Collection Time    10/05/14  3:27 PM      Result Value Ref Range Status   Specimen Description BLOOD LEFT FOREARM   Final   Special Requests BOTTLES DRAWN AEROBIC ONLY 3CC   Final   Culture  Setup Time     Final   Value: 10/05/2014 18:50     Performed at [facility]   Culture     Final   Value: GRAM POSITIVE COCCI IN CHAINS     Note: Gram Stain Report Called to,Read Back By and Verified With: JACLYN T. @ 10.25AM 10.19.15     Performed at [facility]   Report Status PENDING   Incomplete  CULTURE, BLOOD (ROUTINE X 2)     Status: None   Collection Time    10/05/14  3:28 PM      Result Value Ref Range Status   Specimen Description BLOOD LEFT ANTECUBITAL   Final   Special Requests BOTTLES DRAWN AEROBIC AND ANAEROBIC 3CC   Final   Culture  Setup Time     Final   Value: 10/05/2014 18:50     Performed at [facility]   Culture     Final   Value: GRAM POSITIVE COCCI IN CHAINS     Note: Gram Stain Report Called to,Read Back By and Verified With: Nancee Liter RN on 10/06/14 at 06:25 by Rise Mu     Performed at [facility]   Report Status PENDING   Incomplete  WOUND CULTURE     Status: None   Collection Time    10/05/14  5:56 PM      Result Value Ref Range Status   Specimen Description WOUND FOOT   Final   Special Requests NONE   Final   Gram Stain     Final   Value: NO WBC SEEN     NO SQUAMOUS EPITHELIAL CELLS SEEN     NO ORGANISMS SEEN     Performed at [facility]   Culture PENDING   Incomplete   Report Status PENDING   Incomplete  MRSA PCR SCREENING     Status: None   Collection Time    10/05/14  8:48 PM      Result Value Ref Range Status   MRSA by PCR NEGATIVE  NEGATIVE Final   Comment:            The GeneXpert MRSA Assay (FDA     approved for NASAL specimens     only), is one component of a     comprehensive MRSA colonization     surveillance program. It is not     intended to diagnose MRSA     infection nor to guide or     monitor treatment for     MRSA infections.     Impression/Recommendation  Active Problems:   Sepsis   Zed Wanninger [name] is a 80 y.o. male with  Multiple medical problems including "prediabetes", extensive smoking PVD occluded superficial femoral with ulcer that has been not resolved over 6 month + period with recurrent episodes of purulent drainage now with septic shock from gram positive bacteremia  #1 Purulent foot ulcer with Gram positive bacteremia in pt with PVD, prediabetes  --dc zosyn as we do not need pseudomonal coverage --change to high dose rocephin 2 g daily for excellent strep coverage (this is likely a group B, A (or c, g) strep bacteremia --continued flagyl for anaerobic coverage for now along with vancomycin in case the culture report is wrong and we are actually dealing with MRSA  ---repeat blood cultures --repeat MRI of the right foot --WOC, Nutrition consults --DM consulted though pt technically not diabetic   He is apprehensive about risk of losing his foot and also though of undergoing VVS surgery  This patient has a VERY HIGH risk of dying in this coming. Year [facility] data for MSSP  patients with ulcers such as this pt were 25% all cause at one year and this patient has mx bad prognostic markers so his % even worse  Would consider Palliative care consult re goals of care  --Greatly appreciate CCM, VVS, WOC, CM, Nutrition DM care for this pt with complicated foot ulcer and septic  shock  #2 Left sided foot ulcer: does not appear overtly infected, but will get plain films since this has not resolved.      10/06/2014, 3:14 PM   Thank you so much for this interesting consult  [place] [facility] [place] [phone] (pager) [phone] (office) 10/06/2014, 3:14 PM  [place] 10/06/2014, 3:14 PM

## 2014-10-06 NOTE — Progress Notes (Signed)
CSW received referral to [facility].  Inappropriate CSW referral. CSW notified RNCM .  No further social work needs identified at this time.  Please re-consult if social work needs arise.   Alison Murray, MSW, [place] [facility] [phone]

## 2014-10-06 NOTE — Progress Notes (Signed)
Inpatient Diabetes Program Recommendations  AACE/ADA: New Consensus Statement on Inpatient Glycemic Control (2013)  Target Ranges:  Prepandial:   less than 140 mg/dL      Peak postprandial:   less than 180 mg/dL (1-2 hours)      Critically ill patients:  140 - 180 mg/dL    Results for [name], [name] (MRN [id number]) as of 10/06/2014 11:05  Ref. Range 10/06/2014 08:08  Glucose-Capillary Latest Range: 70-99 mg/dL 94    Results for [name], [name] (MRN [id number]) as of 10/06/2014 11:05  Ref. Range 08/29/2014 05:12  Hemoglobin A1C Latest Range: <5.7 % 6.2 (H)     Patient admitted with Sepsis secondary to heel ulcer.  Patient has history of HTN, CKD3, COPD.    No History of DM mentioned in H&P.  A1c back in September was 6.2%.  Note Novolog Sensitive SSI started this AM.  Dr. Virgina Jock following.  AM CBG today 94 mg/dl.    Will follow Wyn Quaker RN, MSN, CDE Diabetes Coordinator Inpatient Diabetes Program Team Pager: [phone] (8a-10p)

## 2014-10-07 ENCOUNTER — Inpatient Hospital Stay (HOSPITAL_COMMUNITY): Payer: Medicare Other

## 2014-10-07 DIAGNOSIS — L97429 Non-pressure chronic ulcer of left heel and midfoot with unspecified severity: Secondary | ICD-10-CM

## 2014-10-07 DIAGNOSIS — E08621 Diabetes mellitus due to underlying condition with foot ulcer: Secondary | ICD-10-CM

## 2014-10-07 DIAGNOSIS — E669 Obesity, unspecified: Secondary | ICD-10-CM

## 2014-10-07 DIAGNOSIS — N183 Chronic kidney disease, stage 3 (moderate): Secondary | ICD-10-CM

## 2014-10-07 DIAGNOSIS — A409 Streptococcal sepsis, unspecified: Secondary | ICD-10-CM

## 2014-10-07 DIAGNOSIS — L97509 Non-pressure chronic ulcer of other part of unspecified foot with unspecified severity: Secondary | ICD-10-CM

## 2014-10-07 DIAGNOSIS — Z01810 Encounter for preprocedural cardiovascular examination: Secondary | ICD-10-CM

## 2014-10-07 DIAGNOSIS — E875 Hyperkalemia: Secondary | ICD-10-CM

## 2014-10-07 DIAGNOSIS — I471 Supraventricular tachycardia: Secondary | ICD-10-CM

## 2014-10-07 DIAGNOSIS — E785 Hyperlipidemia, unspecified: Secondary | ICD-10-CM | POA: Diagnosis present

## 2014-10-07 DIAGNOSIS — L98499 Non-pressure chronic ulcer of skin of other sites with unspecified severity: Secondary | ICD-10-CM

## 2014-10-07 DIAGNOSIS — I350 Nonrheumatic aortic (valve) stenosis: Secondary | ICD-10-CM

## 2014-10-07 DIAGNOSIS — R7881 Bacteremia: Secondary | ICD-10-CM

## 2014-10-07 DIAGNOSIS — B951 Streptococcus, group B, as the cause of diseases classified elsewhere: Secondary | ICD-10-CM

## 2014-10-07 LAB — GLUCOSE, CAPILLARY
GLUCOSE-CAPILLARY: 123 mg/dL — AB (ref 70–99)
GLUCOSE-CAPILLARY: 181 mg/dL — AB (ref 70–99)
Glucose-Capillary: 185 mg/dL — ABNORMAL HIGH (ref 70–99)
Glucose-Capillary: 159 mg/dL — ABNORMAL HIGH (ref 70–99)

## 2014-10-07 LAB — CBC WITH DIFFERENTIAL/PLATELET
Basophils Absolute: 0 10*3/uL (ref 0.0–0.1)
Basophils Relative: 0 % (ref 0–1)
EOS ABS: 0.2 10*3/uL (ref 0.0–0.7)
EOS PCT: 3 % (ref 0–5)
HCT: 30.9 % — ABNORMAL LOW (ref 39.0–52.0)
HEMOGLOBIN: 10.3 g/dL — AB (ref 13.0–17.0)
LYMPHS PCT: 12 % (ref 12–46)
Lymphs Abs: 1 10*3/uL (ref 0.7–4.0)
MCH: 27.2 pg (ref 26.0–34.0)
MCHC: 33.3 g/dL (ref 30.0–36.0)
MCV: 81.5 fL (ref 78.0–100.0)
MONOS PCT: 8 % (ref 3–12)
Monocytes Absolute: 0.7 10*3/uL (ref 0.1–1.0)
Neutro Abs: 6.6 10*3/uL (ref 1.7–7.7)
Neutrophils Relative %: 77 % (ref 43–77)
Platelets: 144 10*3/uL — ABNORMAL LOW (ref 150–400)
RBC: 3.79 MIL/uL — AB (ref 4.22–5.81)
RDW: 14.8 % (ref 11.5–15.5)
WBC: 8.5 10*3/uL (ref 4.0–10.5)

## 2014-10-07 LAB — URINE CULTURE
CULTURE: NO GROWTH
Colony Count: NO GROWTH
Colony Count: NO GROWTH
Culture: NO GROWTH

## 2014-10-07 LAB — COMPREHENSIVE METABOLIC PANEL
ALT: 18 U/L (ref 0–53)
AST: 25 U/L (ref 0–37)
Albumin: 2.2 g/dL — ABNORMAL LOW (ref 3.5–5.2)
Alkaline Phosphatase: 77 U/L (ref 39–117)
Anion gap: 12 (ref 5–15)
BUN: 23 mg/dL (ref 6–23)
CALCIUM: 8.1 mg/dL — AB (ref 8.4–10.5)
CO2: 24 mEq/L (ref 19–32)
Chloride: 101 mEq/L (ref 96–112)
Creatinine, Ser: 1.5 mg/dL — ABNORMAL HIGH (ref 0.50–1.35)
GFR calc Af Amer: 48 mL/min — ABNORMAL LOW (ref >90)
GFR calc non Af Amer: 41 mL/min — ABNORMAL LOW (ref >90)
Glucose, Bld: 140 mg/dL — ABNORMAL HIGH (ref 70–99)
Potassium: 3.4 mEq/L — ABNORMAL LOW (ref 3.7–5.3)
SODIUM: 137 meq/L (ref 137–147)
TOTAL PROTEIN: 5.9 g/dL — AB (ref 6.0–8.3)
Total Bilirubin: 0.4 mg/dL (ref 0.3–1.2)

## 2014-10-07 LAB — PHOSPHORUS: Phosphorus: 2.3 mg/dL (ref 2.3–4.6)

## 2014-10-07 LAB — HIV ANTIBODY (ROUTINE TESTING W REFLEX): HIV: NONREACTIVE

## 2014-10-07 LAB — HEMOGLOBIN A1C
Hgb A1c MFr Bld: 6.4 % — ABNORMAL HIGH (ref <5.7)
Mean Plasma Glucose: 137 mg/dL — ABNORMAL HIGH (ref <117)

## 2014-10-07 LAB — C-REACTIVE PROTEIN: CRP: 19.2 mg/dL — ABNORMAL HIGH (ref <0.60)

## 2014-10-07 LAB — MAGNESIUM: Magnesium: 1.4 mg/dL — ABNORMAL LOW (ref 1.5–2.5)

## 2014-10-07 LAB — SEDIMENTATION RATE: Sed Rate: 90 mm/hr — ABNORMAL HIGH (ref 0–16)

## 2014-10-07 MED ORDER — CARBIDOPA-LEVODOPA 10-100 MG PO TABS
1.0000 | ORAL_TABLET | Freq: Every evening | ORAL | Status: DC
  Administered 2014-10-07 – 2014-10-08 (×2): 1 via ORAL
  Filled 2014-10-07 (×5): qty 1

## 2014-10-07 MED ORDER — MOMETASONE FURO-FORMOTEROL FUM 100-5 MCG/ACT IN AERO
2.0000 | INHALATION_SPRAY | Freq: Two times a day (BID) | RESPIRATORY_TRACT | Status: DC
  Administered 2014-10-07 – 2014-10-09 (×4): 2 via RESPIRATORY_TRACT
  Filled 2014-10-07: qty 8.8

## 2014-10-07 MED ORDER — MAGNESIUM OXIDE 400 (241.3 MG) MG PO TABS
400.0000 mg | ORAL_TABLET | Freq: Every day | ORAL | Status: DC
  Administered 2014-10-07 – 2014-10-09 (×3): 400 mg via ORAL
  Filled 2014-10-07 (×3): qty 1

## 2014-10-07 MED ORDER — TORSEMIDE 10 MG PO TABS
10.0000 mg | ORAL_TABLET | Freq: Every day | ORAL | Status: DC
  Administered 2014-10-07 – 2014-10-09 (×3): 10 mg via ORAL
  Filled 2014-10-07 (×3): qty 1

## 2014-10-07 NOTE — Consult Note (Signed)
Admit date: 10/05/2014 Referring Physician  Virgina Jock Primary Physician [name], MD Primary Cardiologist  None Reason for Consultation   arrhythmia  HPI: 80 year old male with multiple comorbidities including moderate aortic stenosis, chronic kidney disease stage III, COPD, peripheral vascular disease with admission secondary to sepsis from heel ulcer on right foot. Infectious disease, vascular surgery, wound care, critical care medicine has been following. We were asked to get involved secondary to atrial arrhythmias currently controlled with diltiazem IV.  Telemetry strips as well as EKGs were reviewed, see below for discussion.  Currently he demonstrates mild shortness of breath, not significantly different from baseline he states. He is trying to contemplate whether or not he should go forward with foot surgery/amputation.   He denies any prior heart attacks strokes. He has had readmissions secondary to heal ulcer in the past.    PMH:   Past Medical History  Diagnosis Date  . Hyperlipidemia   . COPD (chronic obstructive pulmonary disease)   . Leg pain   . GERD (gastroesophageal reflux disease)   . Depression   . Dizziness   . Productive cough   . Thyroid disease   . Peripheral vascular disease   . ED (erectile dysfunction)   . Hypertension     08/17/12 - wife denies  . Walking pneumonia 2000's  . H/O hiatal hernia   . Migraine     "when I was a young boy"  . Arthritis     "hands" (08/28/2014)  . Anxiety   . Hypothyroidism   . Chronic kidney disease (CKD), stage III (moderate)     [name] 08/28/2014    PSH:   Past Surgical History  Procedure Laterality Date  . Renal artery stent Left   . Kidney stone surgery  1989  . Lower extremity angiogram Left 03-20-2014    Dr. Tinnie Gens  . Tonsillectomy    . Cataract extraction, bilateral Bilateral   . Blepharoplasty Right    Allergies:  Review of patient's allergies indicates no known allergies. Prior to Admit  Meds:   Prior to Admission medications   Medication Sig Start Date End Date Taking? Authorizing Provider  acetaminophen (TYLENOL) 325 MG tablet Take 650 mg by mouth every 6 (six) hours as needed. For pain or fever   Yes Historical Provider, MD  albuterol (PROVENTIL) (5 MG/ML) 0.5% nebulizer solution Take 2.5 mg by nebulization every 6 (six) hours as needed. For shortness of breath   Yes Historical Provider, MD  aspirin EC 81 MG tablet Take 81 mg by mouth every morning.    Yes Historical Provider, MD  carbidopa-levodopa (SINEMET) 10-100 MG per tablet Take 1 tablet by mouth every evening.    Yes Historical Provider, MD  clonazePAM (KLONOPIN) 0.5 MG tablet Take 0.25-1 mg by mouth 2 (two) times daily as needed for anxiety. Take 1/2 of tablet (.25mg ) in the morning and take 2 (1) tablets at night.   Yes Historical Provider, MD  feeding supplement (ENSURE) PUDG Take 1 Container by mouth 3 (three) times daily between meals. 08/23/12  Yes [name], MD  fentaNYL (DURAGESIC - DOSED MCG/HR) 50 MCG/HR Place 50 mcg onto the skin every 3 (three) days.   Yes Historical Provider, MD  Fluticasone-Salmeterol (ADVAIR) 250-50 MCG/DOSE AEPB Inhale 1-2 puffs into the lungs 2 (two) times daily as needed (for wheezing.).   Yes Historical Provider, MD  gabapentin (NEURONTIN) 300 MG capsule Take 1 capsule (300 mg total) by mouth 2 (two) to 3 (three)  times  daily. 09/02/14  Yes [name], MD  HYDROcodone-acetaminophen (NORCO/VICODIN) 5-325 MG per tablet Take 1-2 tablets by mouth every 6 (six) hours as needed. For pain   Yes Historical Provider, MD  ipratropium (ATROVENT) 0.02 % nebulizer solution Take 500 mcg by nebulization every 6 (six) hours as needed. For shortness of breath   Yes Historical Provider, MD  levothyroxine (SYNTHROID, LEVOTHROID) 112 MCG tablet Take 112 mcg by mouth every morning.    Yes Historical Provider, MD  mirtazapine (REMERON) 30 MG tablet Take 30 mg by mouth at bedtime.    Yes Historical Provider,  MD  mupirocin ointment (BACTROBAN) 2 % Place 1 application into the nose 2 (two) times daily. 09/02/14  Yes [name], MD  QUEtiapine (SEROQUEL) 25 MG tablet Take 50 mg by mouth at bedtime. May take 1-2 during the day as needed.   Yes Historical Provider, MD  sertraline (ZOLOFT) 100 MG tablet Take 100 mg by mouth every evening.    Yes Historical Provider, MD  simvastatin (ZOCOR) 40 MG tablet Take 40 mg by mouth at bedtime.    Yes Historical Provider, MD  torsemide (DEMADEX) 20 MG tablet Take 0.5 tablets (10 mg total) by mouth daily as needed for edema. Unfortunately he dislikes Diuretics as it makes his L Kidney hurt 09/02/14   [name], MD   Fam HX:    Family History  Problem Relation Age of Onset  . Other Mother     AAA  . AAA (abdominal aortic aneurysm) Mother   . Heart attack Father   . Alcohol abuse Father   . Diabetes Father   . Other Father     amputation  . Diabetes Brother   . Coronary artery disease Brother   . Dementia Brother    Social HX:    History   Social History  . Marital Status: Married    Spouse Name: N/A    Number of Children: N/A  . Years of Education: N/A   Occupational History  . Not on file.   Social History Main Topics  . Smoking status: Former Smoker -- 1.00 packs/day for 25 years    Types: Cigarettes    Quit date: 08/17/2012  . Smokeless tobacco: Never Used  . Alcohol Use: Yes     Comment: 08/28/2014 "last drink was in ~ 2012"  . Drug Use: No  . Sexual Activity: Not on file   Other Topics Concern  . Not on file   Social History Narrative  . No narrative on file     ROS:  Positive recent fatigue, fevers, weakness, right nonhealing ulcer, recent altered mental status. All 11 ROS were addressed and are negative except what is stated in the HPI   Physical Exam: Blood pressure 144/72, pulse 174, temperature 98.4 F (36.9 C), temperature source Oral, resp. rate 18, height 6' (1.829 m), weight 245 lb 9.5 oz (111.4 kg), SpO2 100.00%.    General: Well developed, overweight in no acute distress Head: Eyes PERRLA, No xanthomas.   Normal cephalic and atramatic  Lungs:   Mildly increased respiratory effort, Clear bilaterally to auscultation and percussion. Normal respiratory effort. No wheezes, no rales. Heart:   HRRR S1 S2 Pulses are 2+ & equal. 2/6 systolic right upper sternal border murmur, no rubs, gallops.  No carotid bruit. No JVD.  No abdominal bruits.  Abdomen: Bowel sounds are positive, abdomen soft and non-tender without masses. No hepatosplenomegaly. Obese Msk:  Back normal. Normal strength and tone for  age. Extremities:  No clubbing, cyanosis. 3+ chronic edema lower extremities, right greater than left, right heel ulcer noted.  Not able to palpate distal arteries and legs bilaterally Neuro: Alert and oriented X 3, non-focal, MAE x 4 GU: Deferred Rectal: Deferred Psych:  Good affect, responds appropriately      Labs: Lab Results  Component Value Date   WBC 8.5 10/07/2014   HGB 10.3* 10/07/2014   HCT 30.9* 10/07/2014   MCV 81.5 10/07/2014   PLT 144* 10/07/2014     Recent Labs Lab 10/07/14 0350  NA 137  K 3.4*  CL 101  CO2 24  BUN 23  CREATININE 1.50*  CALCIUM 8.1*  PROT 5.9*  BILITOT 0.4  ALKPHOS 77  ALT 18  AST 25  GLUCOSE 140*    Recent Labs  10/05/14 1619 10/06/14 1243  TROPONINI <0.30 <0.30       Radiology:  US Aorta  10/06/2014   CLINICAL DATA:  Aortic ectasia.  History of hypertension.  EXAM: ULTRASOUND OF ABDOMINAL AORTA  TECHNIQUE: Ultrasound examination of the abdominal aorta was performed to evaluate for abdominal aortic aneurysm.  COMPARISON:  None.  FINDINGS: Limited visibility of the proximal aorta due to increased bowel gas and patient body habitus.  Abdominal Aorta  Some atherosclerotic calcifications are noted in the abdominal aorta.  Maximum AP  Diameter: 3.3 cm maximum anterior to posterior diameter in the proximal abdominal aorta.  Maximum TRV  Diameter: 2.9 cm in the  mid abdominal aorta.  Right common iliac artery:   Measures a maximum diameter of 1.3 cm.  Left common iliac artery:   Measures a maximum diameter of 1.3 cm.  IMPRESSION: Maximum AP diameter of the abdominal aorta is 3.3 cm, in the proximal abdominal aorta. This is consistent with mild aneurysmal dilatation. Atherosclerotic calcification is noted in the abdominal aorta.  Common iliac arteries are within normal limits for maximum diameter.   Electronically Signed   By: Curlene Dolphin M.D.   On: 10/06/2014 20:15   Dg Chest Port 1 View  10/05/2014   CLINICAL DATA:  Shortness of breath, cellulitis, COPD  EXAM: PORTABLE CHEST - 1 VIEW  COMPARISON:  08/28/2014  FINDINGS: There is bilateral mild increased interstitial thickening. There are trace bilateral pleural effusions. There is no pneumothorax. There is stable cardiomegaly. The osseous structures are unremarkable.  IMPRESSION: Overall findings concerning for mild CHF.   Electronically Signed   By: Kathreen Devoid   On: 10/05/2014 17:22   Dg Foot Complete Right  10/05/2014   CLINICAL DATA:  Ulceration on the plantar surface of the right heel.  EXAM: RIGHT FOOT COMPLETE - 3+ VIEW  COMPARISON:  Plain films 08/28/2014 and MRI 09/01/2014.  FINDINGS: Soft tissues of the foot are diffusely and severely swollen. Skin ulceration is seen over the plantar surface of the calcaneus. No underlying soft tissue gas collection, bony destructive change, radiopaque foreign body or periostitis is identified. No fracture or dislocation.  IMPRESSION: Skin ulceration on the heel without plain film evidence of osteomyelitis.  Severe soft tissue swelling diffusely about the foot.   Electronically Signed   By: Inge Rise M.D.   On: 10/05/2014 17:54   Personally viewed.  EKG:  Multiple EKGs reviewed, from 10/06/14, irregularly irregular rhythm which appears to be multifocal atrial tachycardia, P wave morphologies are seen prior to QRS. Same on 10/05/14, once again multifocal atrial  rhythm. Telemetry also demonstrates these findings. When tachycardia is present, heart rate in the 160 range, it  is challenging to make this definitive diagnosis of multifocal atrial tachycardia although EKGs seemed to demonstrate this. Personally viewed.   Echocardiogram 08/31/14: Moderate aortic stenosis, EF 65-70%, vigorous, mild secondary pulmonary hypertension likely secondary to obesity, COPD  ASSESSMENT/PLAN:    80 year old male with multiple comorbidities including severe peripheral vascular disease with nonhealing right heel ulcer admitted with sepsis with EKG consistent with multifocal atrial tachycardia currently controlled on IV diltiazem.  1. Multifocal atrial tachycardia-EKGs reviewed do demonstrate P wave morphology preceding QRS complexes. P wave sometimes are buried within T-wave. I do believe that there are greater than 3 morphologies hence multifocal atrial tachycardia. His underlying lung disease can contribute to this entity. I do agree however, that it is challenging at some point on telemetry especially during rapid ventricular response to fully exclude atrial fibrillation although EKGs do in my mind demonstrate multifocal atrial tachycardia. Regardless, he is being well rate controlled with IV diltiazem. Consider transition to by mouth diltiazem, perhaps 360 by mouth CD daily. Since I do not believe that this is atrial fibrillation, no anticoagulation warranted. If surgery is necessary, I do not see any cardiac contraindications. Tachycardia may present itself once again and should be correctable by IV calcium channel blocker or perhaps additional beta blocker. With his pulmonary condition, age, peripheral vascular disease, he would be at moderate risk from a cardiac perspective. He understands this. He seems to be quite hesitant about proceeding with surgery. He is trying to weigh the pros and cons and understands that this persistent heel ulcer may result in further infections and  possible death. Plan discussed with Dr. Virgina Jock.  2. Peripheral vascular disease-per Dr. Donnetta Hutching and associates. Aspirin.  3. Obesity-encourage weight loss  4. Moderate aortic stenosis-should not be of clinical consequence. Continue to monitor clinically.  5. COPD-mild shortness of breath at baseline he states. Deconditioning also playing a role. Consider dose of IV furosemide. Creatinine currently 1.5 down from 1.8.  6. Hypokinemia-continue to replete per primary team.  7. Hyperlipidemia-continue with simvastatin.  8. Nonhealing right heel ulcer-ID, [facility] onboard. Once again, he may proceed with surgery if necessary. Moderate cardiac risk. EF normal.  We'll continue to follow.  Candee Furbish, MD  10/07/2014  7:48 AM

## 2014-10-07 NOTE — Progress Notes (Addendum)
Patient is refusing to wear Prevalon Boot on right foot. Bilateral heels elevated.

## 2014-10-07 NOTE — Progress Notes (Signed)
Received patient from SDU, Agree with previous Rn's assessment.

## 2014-10-07 NOTE — Progress Notes (Signed)
VASCULAR LAB PRELIMINARY  PRELIMINARY  PRELIMINARY  PRELIMINARY  Right Lower Extremity Vein Map    Right Great Saphenous Vein   Segment Diameter Comment  1. Origin 5.6 mm Patent and compressible  2. High Thigh 7.9 mm Patent and compressible with a 6 mm branch  3. Mid Thigh 5.5 mm Patent and compressible  4. Low Thigh 6.5 mm Patent and compressible  5. At Knee 5.4 mm Patent and compressible with multiple branches  6. High Calf 5.3 mm Patent and compressible  7. Low Calf 5.6 mm Patent and compressible  8. Ankle 5.4 mm Patent and compressible with multiple branches      Left Lower Extremity Vein Map    Left Great Saphenous Vein   Segment Diameter Comment  1. Origin 7.9 mm Patent and compressible  2. High Thigh 4.7 mm Patent and compressible  3. Mid Thigh 2.8 mm Patent and compressible. Branch 2.7 mm  4. Low Thigh 2.9 mm Patent and compressible  5. At Knee 3.9 mm Patent and compressible  6. High Calf 4.5 mm Patent and compressible  7. Low Calf 3.7 mm Patent and compressible  8. Ankle 4.1 mm Patent and compressible     Right great saphenous vein runs quite medial in the thigh.   Tabor Bartram, RVS 10/07/2014, 10:29 AM

## 2014-10-07 NOTE — Progress Notes (Signed)
In MRI from 2100- 2300

## 2014-10-07 NOTE — Progress Notes (Addendum)
   Daily Progress Note   Dr. Donnetta Hutching will be back on Thursday.  Vein mapping demonstrates adequate R GSV for CFA to AK bypass.  MRI findings would also push for intervention.  Options: 1.  CFA TO AK Bypass with GSV +/- R heel debridement (defer to ORTHO) 2.  R BKA vs AKA 3.  IV abx + wound care +/- R heel debridement (defer to [facility])    - will come by tomorrow to check if patient has changed his mind on intervention  - additionally, [place] abd demonstrates 3.3 cm aorta, i.e. Small AAA  Adele Barthel, MD [facility] Office: [phone] Pager: [phone]  10/07/2014, 11:10 AM

## 2014-10-07 NOTE — Progress Notes (Signed)
Subjective: Admitted c Sepsis from Heel Ulcer. Seen by CCM, ID, Vasc, Wound Care etc. Now on 15 Cardizem - HRs 80-90s Eating better. Some pains and had pain meds at 11 pm Foot hurts if touched.  We had a long discussion @ options.  He said he needs time to think about it as it all is coming on suddenly. I reminded him that he has been dealing c the foot since July   Objective: Vital signs in last 24 hours: Temp:  [98.1 F (36.7 C)-98.8 F (37.1 C)] 98.4 F (36.9 C) (10/20 0400) Pulse Rate:  [115-174] 174 (10/19 1421) Resp:  [17-29] 18 (10/20 0000) BP: (110-153)/(54-88) 144/72 mmHg (10/20 0000) SpO2:  [92 %-100 %] 100 % (10/20 0005) Weight change:  Last BM Date: 10/06/14  CBG (last 3)   Recent Labs  10/06/14 1243 10/06/14 1735 10/06/14 1948  GLUCAP 181* 126* 130*    Intake/Output from previous day:  Intake/Output Summary (Last 24 hours) at 10/07/14 0707 Last data filed at 10/07/14 0600  Gross per 24 hour  Intake 1576.25 ml  Output   2001 ml  Net -424.75 ml   10/19 0701 - 10/20 0700 In: 1576.3 [P.O.:120; I.V.:956.3; IV Piggyback:500] Out: 2001 [Urine:2000; Stool:1]   Physical Exam  General appearance: Alert and O Eyes: no scleral icterus Throat: oropharynx moist without erythema Resp: distant, mostly clear Cardio: irreg/Irreg plus PVCs.  Murmur GI: soft, non-tender; bowel sounds normal; no masses,  no organomegaly Extremities: R heel c some cellulitis.  ++ Edema.  Ulcer c foul odor drainage   Lab Results:  Recent Labs  10/05/14 1846 10/06/14 0750 10/07/14 0350  NA  --  139 137  K  --  3.8 3.4*  CL  --  102 101  CO2  --  22 24  GLUCOSE  --  103* 140*  BUN  --  25* 23  CREATININE  --  1.67* 1.50*  CALCIUM  --  8.2* 8.1*  MG 1.0*  --  1.4*  PHOS 2.3  --  2.3     Recent Labs  10/06/14 0750 10/07/14 0350  AST 25 25  ALT 17 18  ALKPHOS 71 77  BILITOT 0.8 0.4  PROT 6.0 5.9*  ALBUMIN 2.3* 2.2*     Recent Labs  10/06/14 0750  10/07/14 0350  WBC 10.3 8.5  NEUTROABS 8.5* 6.6  HGB 10.6* 10.3*  HCT 32.5* 30.9*  MCV 82.7 81.5  PLT 126* 144*    Lab Results  Component Value Date   INR 1.04 09/01/2014   INR 1.18 08/28/2014   INR 1.07 01/03/2013     Recent Labs  10/05/14 1619 10/06/14 1243  TROPONINI <0.30 <0.30     Recent Labs  10/06/14 0750  TSH 0.433    No results found for this basename: VITAMINB12, FOLATE, FERRITIN, TIBC, IRON, RETICCTPCT,  in the last 72 hours  Micro Results: Recent Results (from the past 240 hour(s))  CULTURE, BLOOD (ROUTINE X 2)     Status: None   Collection Time    10/05/14  3:27 PM      Result Value Ref Range Status   Specimen Description BLOOD LEFT FOREARM   Final   Special Requests BOTTLES DRAWN AEROBIC ONLY 3CC   Final   Culture  Setup Time     Final   Value: 10/05/2014 18:50     Performed at [facility]   Culture     Final   Value: [place]  Note: Gram Stain Report Called to,Read Back By and Verified With: JACLYN T. @ 10.25AM 10.19.15     Performed at [facility]   Report Status PENDING   Incomplete  CULTURE, BLOOD (ROUTINE X 2)     Status: None   Collection Time    10/05/14  3:28 PM      Result Value Ref Range Status   Specimen Description BLOOD LEFT ANTECUBITAL   Final   Special Requests BOTTLES DRAWN AEROBIC AND ANAEROBIC 3CC   Final   Culture  Setup Time     Final   Value: 10/05/2014 18:50     Performed at [facility]   Culture     Final   Value: GRAM POSITIVE COCCI IN CHAINS     Note: Gram Stain Report Called to,Read Back By and Verified With: Nancee Liter RN on 10/06/14 at 06:25 by Rise Mu     Performed at [facility]   Report Status PENDING   Incomplete  URINE CULTURE     Status: None   Collection Time    10/05/14  5:26 PM      Result Value Ref Range Status   Specimen Description URINE, RANDOM   Final   Special Requests NONE   Final   Culture  Setup Time     Final   Value:  10/06/2014 00:16     Performed at [place]     Final   Value: NO GROWTH     Performed at [facility]   Culture     Final   Value: NO GROWTH     Performed at [facility]   Report Status 10/07/2014 FINAL   Final  WOUND CULTURE     Status: None   Collection Time    10/05/14  5:56 PM      Result Value Ref Range Status   Specimen Description WOUND FOOT   Final   Special Requests NONE   Final   Gram Stain     Final   Value: NO WBC SEEN     NO SQUAMOUS EPITHELIAL CELLS SEEN     NO ORGANISMS SEEN     Performed at [facility]   Culture PENDING   Incomplete   Report Status PENDING   Incomplete  MRSA PCR SCREENING     Status: None   Collection Time    10/05/14  8:48 PM      Result Value Ref Range Status   MRSA by PCR NEGATIVE  NEGATIVE Final   Comment:            The GeneXpert MRSA Assay (FDA     approved for NASAL specimens     only), is one component of a     comprehensive MRSA colonization     surveillance program. It is not     intended to diagnose MRSA     infection nor to guide or     monitor treatment for     MRSA infections.  URINE CULTURE     Status: None   Collection Time    10/05/14  9:09 PM      Result Value Ref Range Status   Specimen Description URINE, CATHETERIZED   Final   Special Requests NONE   Final   Culture  Setup Time     Final   Value: 10/06/2014 03:13     Performed at [place]  Final   Value: NO GROWTH     Performed at [facility]   Culture     Final   Value: NO GROWTH     Performed at [facility]   Report Status 10/07/2014 FINAL   Final     Studies/Results: US Aorta  10/06/2014   CLINICAL DATA:  Aortic ectasia.  History of hypertension.  EXAM: ULTRASOUND OF ABDOMINAL AORTA  TECHNIQUE: Ultrasound examination of the abdominal aorta was performed to evaluate for abdominal aortic aneurysm.  COMPARISON:  None.  FINDINGS: Limited visibility of the  proximal aorta due to increased bowel gas and patient body habitus.  Abdominal Aorta  Some atherosclerotic calcifications are noted in the abdominal aorta.  Maximum AP  Diameter: 3.3 cm maximum anterior to posterior diameter in the proximal abdominal aorta.  Maximum TRV  Diameter: 2.9 cm in the mid abdominal aorta.  Right common iliac artery:   Measures a maximum diameter of 1.3 cm.  Left common iliac artery:   Measures a maximum diameter of 1.3 cm.  IMPRESSION: Maximum AP diameter of the abdominal aorta is 3.3 cm, in the proximal abdominal aorta. This is consistent with mild aneurysmal dilatation. Atherosclerotic calcification is noted in the abdominal aorta.  Common iliac arteries are within normal limits for maximum diameter.   Electronically Signed   By: Curlene Dolphin M.D.   On: 10/06/2014 20:15   Dg Chest Port 1 View  10/05/2014   CLINICAL DATA:  Shortness of breath, cellulitis, COPD  EXAM: PORTABLE CHEST - 1 VIEW  COMPARISON:  08/28/2014  FINDINGS: There is bilateral mild increased interstitial thickening. There are trace bilateral pleural effusions. There is no pneumothorax. There is stable cardiomegaly. The osseous structures are unremarkable.  IMPRESSION: Overall findings concerning for mild CHF.   Electronically Signed   By: Kathreen Devoid   On: 10/05/2014 17:22   Dg Foot Complete Right  10/05/2014   CLINICAL DATA:  Ulceration on the plantar surface of the right heel.  EXAM: RIGHT FOOT COMPLETE - 3+ VIEW  COMPARISON:  Plain films 08/28/2014 and MRI 09/01/2014.  FINDINGS: Soft tissues of the foot are diffusely and severely swollen. Skin ulceration is seen over the plantar surface of the calcaneus. No underlying soft tissue gas collection, bony destructive change, radiopaque foreign body or periostitis is identified. No fracture or dislocation.  IMPRESSION: Skin ulceration on the heel without plain film evidence of osteomyelitis.  Severe soft tissue swelling diffusely about the foot.   Electronically  Signed   By: Inge Rise M.D.   On: 10/05/2014 17:54     Medications: Scheduled: . aspirin EC  81 mg Oral q morning - 10a  . cefTRIAXone (ROCEPHIN)  IV  2 g Intravenous Q24H   And  . metroNIDAZOLE  500 mg Oral 3 times per day  . docusate sodium  100 mg Oral BID  . enoxaparin (LOVENOX) injection  0.5 mg/kg Subcutaneous Q24H  . famotidine  20 mg Oral BID  . feeding supplement (ENSURE)  1 Container Oral TID BM  . gabapentin  300 mg Oral BID  . insulin aspart  0-9 Units Subcutaneous TID WC  . ipratropium-albuterol  3 mL Nebulization QID  . lactose free nutrition  237 mL Oral Q24H  . levothyroxine  112 mcg Oral q morning - 10a  . mupirocin ointment  1 application Nasal BID  . saccharomyces boulardii  250 mg Oral BID  . simvastatin  20 mg Oral QHS  . vancomycin  1,500 mg  Intravenous Q24H   Continuous: . sodium chloride 30 mL/hr at 10/06/14 1819  . diltiazem (CARDIZEM) infusion 15 mg/hr (10/07/14 0517)     Assessment/Plan: Active Problems:   Sepsis   Atrial fibrillation with rapid ventricular response  Sepsis from R Heel Wound. Fevers resolved, Leukoctosis resolved, Lactic Acidosis - better, Blood pressures now fine to high, Hypoxia/hypoventilation/obtundation already better.    +ve blood cultures, Anaerobic bottle, gram +ve cocci in chains - On Vanco/Rocephi/Flagyl.  Wound Culture P.  Repeat Foot x ray = Skin ulceration on the heel without plain film evidence of osteomyelitis. Severe soft tissue swelling diffusely about the foot. Appreciate ID consultation.  Await repeat MRI  Hypersomnolence and lethargy - better post hydration, IVF and fentanyl off  Known PAD.  He had LE Arteriogram 9/14 = complete occlusion of his right superficial femoral artery at the origin with reconstitution of the above-knee popliteal. He has open tibial runoff initially but has extensive small vessel disease into his foot.  MRI 9/14 showed no definitive evidence of Osteo involvement of his calcaneus. Sed  rate on 9/15 was 65.  Last saw Dr Donnetta Hutching 9/22 and they discussed right femoral to above-knee popliteal bypass if he has progression of tissue loss.  Seen By Vasc surgery yesterday and discussed options.  Repeat ABIs ABI / TBI  R 0.65 / 0.26 and L 0.86 / 0.33.  Await current MRI.  Discussion @ Revasc vrs amputation vrs more conservative options being had No AAA  Afib RVR - Cardizem gtt.  Holding hep gtt.  Had ECHO 08/2014 done c murmur - 9/2015EF 65-70%. Mild LVH. Mod AS. Grade 1 Diastolic Dysfunction. Pulm HTN c PAP pressures 60mmHg. Cards consulted for Afib vrs MAT  Sacral Decub - Barrier cream to buttocks to protect and repel moisture.  Float heel to reduce pressure.   Elevated Pro-BNP c nml Trop I.  EKG Afib. Low Albumin - Protein Malnutrition.  Mild Anemia - Hbg 10.3- 12.7  Mild Thrombocytopenia - 126-158  DVT Proph ordered CBGs 90-180. A1C 6.2. CBGs BID  CKD3 - Cr 1.50 - 1.83 COPD - On Rxs. Pulm Toilet.  Pulm CCM HTN - BP fine to a little high RLS - Sinemet is home med  Agree that he has a high Morbidity and Mortality regardless of what we do. Transfer to Tele. Appreciate all help   ID -  Anti-infectives   Start     Dose/Rate Route Frequency Ordered Stop   10/07/14 0600  vancomycin (VANCOCIN) 1,500 mg in sodium chloride 0.9 % 500 mL IVPB     1,500 mg 250 mL/hr over 120 Minutes Intravenous Every 24 hours 10/06/14 1057     10/06/14 1400  metroNIDAZOLE (FLAGYL) tablet 500 mg     500 mg Oral 3 times per day 10/06/14 0959     10/06/14 1200  cefTRIAXone (ROCEPHIN) 2 g in dextrose 5 % 50 mL IVPB     2 g 100 mL/hr over 30 Minutes Intravenous Every 24 hours 10/06/14 0959     10/06/14 0500  vancomycin (VANCOCIN) IVPB 1000 mg/200 mL premix  Status:  Discontinued     1,000 mg 200 mL/hr over 60 Minutes Intravenous Every 12 hours 10/05/14 1700 10/06/14 1057   10/06/14 0000  piperacillin-tazobactam (ZOSYN) IVPB 3.375 g  Status:  Discontinued     3.375 g 12.5 mL/hr over 240 Minutes  Intravenous Every 8 hours 10/05/14 1700 10/06/14 0954   10/05/14 1600  piperacillin-tazobactam (ZOSYN) IVPB 3.375 g     3.375  g 100 mL/hr over 30 Minutes Intravenous  Once 10/05/14 1556 10/05/14 1902   10/05/14 1600  vancomycin (VANCOCIN) IVPB 1000 mg/200 mL premix     1,000 mg 200 mL/hr over 60 Minutes Intravenous  Once 10/05/14 1556 10/05/14 1750       LOS: 2 days   Tequisha Maahs M 10/07/2014, 7:07 AM

## 2014-10-07 NOTE — Progress Notes (Signed)
[facility]  Day # 2 ceftriaxone Day # 3 vancomycin Day #2 flagyl  Subjective: No new complaints   Antibiotics:  Anti-infectives   Start     Dose/Rate Route Frequency Ordered Stop   10/07/14 0600  vancomycin (VANCOCIN) 1,500 mg in sodium chloride 0.9 % 500 mL IVPB     1,500 mg 250 mL/hr over 120 Minutes Intravenous Every 24 hours 10/06/14 1057     10/06/14 1400  metroNIDAZOLE (FLAGYL) tablet 500 mg     500 mg Oral 3 times per day 10/06/14 0959     10/06/14 1200  cefTRIAXone (ROCEPHIN) 2 g in dextrose 5 % 50 mL IVPB     2 g 100 mL/hr over 30 Minutes Intravenous Every 24 hours 10/06/14 0959     10/06/14 0500  vancomycin (VANCOCIN) IVPB 1000 mg/200 mL premix  Status:  Discontinued     1,000 mg 200 mL/hr over 60 Minutes Intravenous Every 12 hours 10/05/14 1700 10/06/14 1057   10/06/14 0000  piperacillin-tazobactam (ZOSYN) IVPB 3.375 g  Status:  Discontinued     3.375 g 12.5 mL/hr over 240 Minutes Intravenous Every 8 hours 10/05/14 1700 10/06/14 0954   10/05/14 1600  piperacillin-tazobactam (ZOSYN) IVPB 3.375 g     3.375 g 100 mL/hr over 30 Minutes Intravenous  Once 10/05/14 1556 10/05/14 1902   10/05/14 1600  vancomycin (VANCOCIN) IVPB 1000 mg/200 mL premix     1,000 mg 200 mL/hr over 60 Minutes Intravenous  Once 10/05/14 1556 10/05/14 1750      Medications: Scheduled Meds: . aspirin EC  81 mg Oral q morning - 10a  . cefTRIAXone (ROCEPHIN)  IV  2 g Intravenous Q24H   And  . metroNIDAZOLE  500 mg Oral 3 times per day  . docusate sodium  100 mg Oral BID  . enoxaparin (LOVENOX) injection  0.5 mg/kg Subcutaneous Q24H  . famotidine  20 mg Oral BID  . feeding supplement (ENSURE)  1 Container Oral TID BM  . gabapentin  300 mg Oral BID  . insulin aspart  0-9 Units Subcutaneous TID WC  . ipratropium-albuterol  3 mL Nebulization QID  . lactose free nutrition  237 mL Oral Q24H  . levothyroxine  112 mcg Oral q morning - 10a  . magnesium oxide  400 mg  Oral Daily  . mupirocin ointment  1 application Nasal BID  . saccharomyces boulardii  250 mg Oral BID  . simvastatin  20 mg Oral QHS  . vancomycin  1,500 mg Intravenous Q24H   Continuous Infusions: . sodium chloride 30 mL/hr at 10/06/14 1819  . diltiazem (CARDIZEM) infusion 15 mg/hr (10/07/14 1036)   PRN Meds:.acetaminophen, albuterol, clonazePAM, morphine injection, ondansetron (ZOFRAN) IV, ondansetron, oxyCODONE    Objective: Weight change:   Intake/Output Summary (Last 24 hours) at 10/07/14 1110 Last data filed at 10/07/14 1036  Gross per 24 hour  Intake 1576.25 ml  Output   2427 ml  Net -850.75 ml   Blood pressure 153/66, pulse 174, temperature 97.9 F (36.6 C), temperature source Oral, resp. rate 26, height 6' (1.829 m), weight 245 lb 9.5 oz (111.4 kg), SpO2 94.00%. Temp:  [97.9 F (36.6 C)-98.8 F (37.1 C)] 97.9 F (36.6 C) (10/20 0800) Pulse Rate:  [174] 174 (10/19 1421) Resp:  [17-29] 26 (10/20 0700) BP: (110-153)/(54-88) 153/66 mmHg (10/20 0700) SpO2:  [92 %-100 %] 94 % (10/20 0700)  Physical Exam: General: Alert and awake, oriented x3, pale  HEENT: anicteric sclera, EOMI,  oropharynx clear and without exudate  CVStachycardic, II/VI murmur at PMI  Chest: clear to auscultation bilaterally, no wheezing, rales or rhonchi  Abdomen: soft nontender, nondistended, normal bowel sounds,  Extremities/skin: edematous skin, DP not palpable  Right foot with area of erythema on dorsum near gray tissue and on ankle, posteriorly on heel is ulcer without purulent drainage at present  See pictures from yesterday none taken today   Neuro: nonfocal  CBC:  Recent Labs Lab 10/05/14 1527 10/06/14 0750 10/07/14 0350  HGB 12.7* 10.6* 10.3*  HCT 39.8 32.5* 30.9*  PLT 158 126* 144*     BMET  Recent Labs  10/06/14 0750 10/07/14 0350  NA 139 137  K 3.8 3.4*  CL 102 101  CO2 22 24  GLUCOSE 103* 140*  BUN 25* 23  CREATININE 1.67* 1.50*  CALCIUM 8.2* 8.1*      Liver Panel   Recent Labs  10/06/14 0750 10/07/14 0350  PROT 6.0 5.9*  ALBUMIN 2.3* 2.2*  AST 25 25  ALT 17 18  ALKPHOS 71 77  BILITOT 0.8 0.4       Sedimentation Rate  Recent Labs  10/07/14 0350  ESRSEDRATE 90*   C-Reactive Protein No results found for this basename: CRP,  in the last 72 hours  Micro Results: Recent Results (from the past 720 hour(s))  CULTURE, BLOOD (ROUTINE X 2)     Status: None   Collection Time    10/05/14  3:27 PM      Result Value Ref Range Status   Specimen Description BLOOD LEFT FOREARM   Final   Special Requests BOTTLES DRAWN AEROBIC ONLY 3CC   Final   Culture  Setup Time     Final   Value: 10/05/2014 18:50     Performed at [facility]   Culture     Final   Value: GROUP B STREP(S.AGALACTIAE)ISOLATED     Note: Gram Stain Report Called to,Read Back By and Verified With: JACLYN T. @ 10.25AM 10.19.15     Performed at [facility]   Report Status PENDING   Incomplete  CULTURE, BLOOD (ROUTINE X 2)     Status: None   Collection Time    10/05/14  3:28 PM      Result Value Ref Range Status   Specimen Description BLOOD LEFT ANTECUBITAL   Final   Special Requests BOTTLES DRAWN AEROBIC AND ANAEROBIC 3CC   Final   Culture  Setup Time     Final   Value: 10/05/2014 18:50     Performed at [facility]   Culture     Final   Value: GROUP B STREP(S.AGALACTIAE)ISOLATED     Note: Gram Stain Report Called to,Read Back By and Verified With: Nancee Liter RN on 10/06/14 at 06:25 by Rise Mu     Performed at [facility]   Report Status PENDING   Incomplete  URINE CULTURE     Status: None   Collection Time    10/05/14  5:26 PM      Result Value Ref Range Status   Specimen Description URINE, RANDOM   Final   Special Requests NONE   Final   Culture  Setup Time     Final   Value: 10/06/2014 00:16     Performed at [place]     Final   Value: NO GROWTH     Performed at [facility]  Final   Value: NO GROWTH     Performed at [facility]   Report Status 10/07/2014 FINAL   Final  WOUND CULTURE     Status: None   Collection Time    10/05/14  5:56 PM      Result Value Ref Range Status   Specimen Description WOUND FOOT   Final   Special Requests NONE   Final   Gram Stain     Final   Value: NO WBC SEEN     NO SQUAMOUS EPITHELIAL CELLS SEEN     NO ORGANISMS SEEN     Performed at [facility]   Culture     Final   Value: Culture reincubated for better growth     Performed at [facility]   Report Status PENDING   Incomplete  MRSA PCR SCREENING     Status: None   Collection Time    10/05/14  8:48 PM      Result Value Ref Range Status   MRSA by PCR NEGATIVE  NEGATIVE Final   Comment:            The GeneXpert MRSA Assay (FDA     approved for NASAL specimens     only), is one component of a     comprehensive MRSA colonization     surveillance program. It is not     intended to diagnose MRSA     infection nor to guide or     monitor treatment for     MRSA infections.  URINE CULTURE     Status: None   Collection Time    10/05/14  9:09 PM      Result Value Ref Range Status   Specimen Description URINE, CATHETERIZED   Final   Special Requests NONE   Final   Culture  Setup Time     Final   Value: 10/06/2014 03:13     Performed at [facility]     Final   Value: NO GROWTH     Performed at [facility]   Culture     Final   Value: NO GROWTH     Performed at [facility]   Report Status 10/07/2014 FINAL   Final  CULTURE, BLOOD (ROUTINE X 2)     Status: None   Collection Time    10/06/14 12:43 PM      Result Value Ref Range Status   Specimen Description BLOOD RIGHT WRIST   Final   Special Requests BOTTLES DRAWN AEROBIC ONLY 2 CC   Final   Culture  Setup Time     Final   Value: 10/06/2014 14:49     Performed at [facility]   Culture     Final   Value:         BLOOD CULTURE RECEIVED NO GROWTH TO DATE CULTURE WILL BE HELD FOR 5 DAYS BEFORE ISSUING A FINAL NEGATIVE REPORT     Performed at [facility]   Report Status PENDING   Incomplete  CULTURE, BLOOD (ROUTINE X 2)     Status: None   Collection Time    10/06/14  3:05 PM      Result Value Ref Range Status   Specimen Description BLOOD LEFT HAND   Final   Special Requests BOTTLES DRAWN AEROBIC AND ANAEROBIC  5 CC   Final   Culture  Setup Time     Final  Value: 10/06/2014 21:03     Performed at [facility]   Culture     Final   Value:        BLOOD CULTURE RECEIVED NO GROWTH TO DATE CULTURE WILL BE HELD FOR 5 DAYS BEFORE ISSUING A FINAL NEGATIVE REPORT     Performed at [facility]   Report Status PENDING   Incomplete    Studies/Results: Mri Right Foot Without Contrast  10/07/2014   CLINICAL DATA:  Osteomyelitis of the heel. Subsequent encounter. Ulceration on the plantar aspect of the heel. RIGHT lower extremity redness and swelling.  EXAM: MRI OF THE RIGHT FOOT WITHOUT CONTRAST  TECHNIQUE: Multiplanar, multisequence MR imaging of the ankle was performed. No intravenous contrast was administered.  COMPARISON:  None.  FINDINGS: Diffuse soft tissue edema is present in the distal leg and ankle, which is nonspecific but may represent cellulitis in the appropriate clinical setting. 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  TENDONS  Peroneal: Intact.  Posteromedial: Intact.  Anterior: Intact.  Achilles: Intact.  Plantar Fascia: Normal.  LIGAMENTS  Lateral: Normal.  Medial: Normal.  CARTILAGE  Ankle Joint: Normal.  Subtalar Joints/Sinus Tarsi: Normal.  Bones: Ulcer is present over the plantar aspect of the calcaneus. There is phlegmon in the soft tissues extending up to the plantar surface of the calcaneus, with a small area of cortical osteolysis and marrow edema compatible with osteomyelitis. No abscess. The area of osteomyelitis is just proximal to the plantar fascia  origin.  IMPRESSION: Tiny focus of osteomyelitis in the plantar aspect of the calcaneus deep to the ulcer. Diffuse edema compatible with cellulitis.   Electronically Signed   By: Dereck Ligas M.D.   On: 10/07/2014 08:42   US Aorta  10/06/2014   CLINICAL DATA:  Aortic ectasia.  History of hypertension.  EXAM: ULTRASOUND OF ABDOMINAL AORTA  TECHNIQUE: Ultrasound examination of the abdominal aorta was performed to evaluate for abdominal aortic aneurysm.  COMPARISON:  None.  FINDINGS: Limited visibility of the proximal aorta due to increased bowel gas and patient body habitus.  Abdominal Aorta  Some atherosclerotic calcifications are noted in the abdominal aorta.  Maximum AP  Diameter: 3.3 cm maximum anterior to posterior diameter in the proximal abdominal aorta.  Maximum TRV  Diameter: 2.9 cm in the mid abdominal aorta.  Right common iliac artery:   Measures a maximum diameter of 1.3 cm.  Left common iliac artery:   Measures a maximum diameter of 1.3 cm.  IMPRESSION: Maximum AP diameter of the abdominal aorta is 3.3 cm, in the proximal abdominal aorta. This is consistent with mild aneurysmal dilatation. Atherosclerotic calcification is noted in the abdominal aorta.  Common iliac arteries are within normal limits for maximum diameter.   Electronically Signed   By: Curlene Dolphin M.D.   On: 10/06/2014 20:15   Dg Chest Port 1 View  10/05/2014   CLINICAL DATA:  Shortness of breath, cellulitis, COPD  EXAM: PORTABLE CHEST - 1 VIEW  COMPARISON:  08/28/2014  FINDINGS: There is bilateral mild increased interstitial thickening. There are trace bilateral pleural effusions. There is no pneumothorax. There is stable cardiomegaly. The osseous structures are unremarkable.  IMPRESSION: Overall findings concerning for mild CHF.   Electronically Signed   By: Kathreen Devoid   On: 10/05/2014 17:22   Dg Foot Complete Right  10/05/2014   CLINICAL DATA:  Ulceration on the plantar surface of the right heel.  EXAM: RIGHT FOOT  COMPLETE - 3+ VIEW  COMPARISON:  Plain films 08/28/2014 and MRI 09/01/2014.  FINDINGS: Soft tissues of the foot are diffusely and severely swollen. Skin ulceration is seen over the plantar surface of the calcaneus. No underlying soft tissue gas collection, bony destructive change, radiopaque foreign body or periostitis is identified. No fracture or dislocation.  IMPRESSION: Skin ulceration on the heel without plain film evidence of osteomyelitis.  Severe soft tissue swelling diffusely about the foot.   Electronically Signed   By: Inge Rise M.D.   On: 10/05/2014 17:54      Assessment/Plan:  Active Problems:   Chronic kidney disease, stage III (moderate)   Sepsis   Multifocal atrial tachycardia   Hyperlipidemia   Obesity   Moderate aortic stenosis    Ramello Cordial [name] is a 80 y.o. male with Multiple medical problems including "prediabetes", extensive smoking PVD occluded superficial femoral with ulcer that has been not resolved over 6 month + period with recurrent episodes of purulent drainage now with septic shock from gGroup B streptococcal bacteremia    #1 Purulent foot ulcer with Group B streptococcal bacteremia in pt with PVD, prediabetes now with CALCANEAL OSTEO on MRI  [place] it seems pointless to consider revascularization, as such bone infections do not typically resolve without aggressive curative amputation. I suspect for cure he would need a BKA. I am VERY skeptical that IF he underwent re-vascularization AND debridement by Orthopedic surgery, followed by protracted IV and then oral abx that we could CURE this  I can certainly appreciate that the patient has anxiety about vascular surgery and also about orthopedic surgery and he is correct to want to weigh benefits of surgery or no surgery in terms of risk of surgery itself and weighing risks and benefits of losing his foot.  I am confident WE WILL NOT CURE THIS INFECTION ABSENT BKA and I am skeptical I am  going to be able to even suppress this though I am certainly open to trying this I DO NOT THink it will succeed, it could buy him SOME TIME  I have discussed case with Dr. Doran Durand from Orthopedics who will come see the pt this afternoon as well as with  with Dr. Bridgett Larsson, and Virgina Jock . I spent greater than 45 minutes with the patient including greater than 50% of time in face to face counsel of the patient and in coordination of their care.   --will narrow down to Rocephin 2 grams IV daily  Greatly appreciate VVS, Orthopedics, Cardiology, WOC, Nutrition, CM consults   Being diagnosed with osteo that requires amputation for cure portends similar mortality to being diagnosed with metastatic cancer and is a marker for poor prognosis globally. [facility] data  for MSSP patients with ulcers such as this pt were 25% all cause mortality for all comers at one year and this patient has mx bad prognostic markers so his % even worse   Dr. Virgina Jock to have further discussions with pt tomorrow and to consider Palliative care consult if would help pt with weighing options here   #2 Left sided foot ulcer: does not appear overtly infected, but will get plain films since this has not resolved.       LOS: 2 days   Alcide Evener 10/07/2014, 11:10 AM

## 2014-10-07 NOTE — Consult Note (Signed)
Reason for Consult:  Right calcaneus osteomyelitis Referring Physician:  Dr. Tommy Medal  [name] is an 80 y.o. male.  HPI:  80 y/o male with PMH of PVD and chronic right heel ulcer was admitted with sepsis.  He has MRI findings of osteo of the calcaneus.  He denies any pain at the heel.  He says the ulcer has been present off and on fo r the last 3 years.  He has been to the wound center but now takes care of it himself.  He lives with his wife and has a son locally that is "in bad shape".  He has no h/o diabetes.  Dr. Lianne Moris note was reviewed.  On rocephin and flagyl.  Past Medical History  Diagnosis Date  . Hyperlipidemia   . COPD (chronic obstructive pulmonary disease)   . Leg pain   . GERD (gastroesophageal reflux disease)   . Depression   . Dizziness   . Productive cough   . Thyroid disease   . Peripheral vascular disease   . ED (erectile dysfunction)   . Hypertension     08/17/12 - wife denies  . Walking pneumonia 2000's  . H/O hiatal hernia   . Migraine     "when I was a young boy"  . Arthritis     "hands" (08/28/2014)  . Anxiety   . Hypothyroidism   . Chronic kidney disease (CKD), stage III (moderate)     [name] 08/28/2014    Past Surgical History  Procedure Laterality Date  . Renal artery stent Left   . Kidney stone surgery  1989  . Lower extremity angiogram Left 03-20-2014    Dr. Tinnie Gens  . Tonsillectomy    . Cataract extraction, bilateral Bilateral   . Blepharoplasty Right     Family History  Problem Relation Age of Onset  . Other Mother     AAA  . AAA (abdominal aortic aneurysm) Mother   . Heart attack Father   . Alcohol abuse Father   . Diabetes Father   . Other Father     amputation  . Diabetes Brother   . Coronary artery disease Brother   . Dementia Brother     Social History:  reports that he quit smoking about 2 years ago. His smoking use included Cigarettes. He has a 25 pack-year smoking history. He has never used smokeless  tobacco. He reports that he drinks alcohol. He reports that he does not use illicit drugs.  Allergies: No Known Allergies  Medications: I have reviewed the patient's current medications.  Results for orders placed during the hospital encounter of 10/05/14 (from the past 48 hour(s))  PRO B NATRIURETIC PEPTIDE     Status: Abnormal   Collection Time    10/05/14  3:55 PM      Result Value Ref Range   Pro B Natriuretic peptide (BNP) 5498.0 (*) 0 - 450 pg/mL  I-STAT CG4 LACTIC ACID, ED     Status: Abnormal   Collection Time    10/05/14  4:11 PM      Result Value Ref Range   Lactic Acid, Venous 2.97 (*) 0.5 - 2.2 mmol/L  TROPONIN I     Status: None   Collection Time    10/05/14  4:19 PM      Result Value Ref Range   Troponin I <0.30  <0.30 ng/mL   Comment:            Due to the release kinetics of  cTnI,     a negative result within the first hours     of the onset of symptoms does not rule out     myocardial infarction with certainty.     If myocardial infarction is still suspected,     repeat the test at appropriate intervals.  URINALYSIS, ROUTINE W REFLEX MICROSCOPIC     Status: Abnormal   Collection Time    10/05/14  5:26 PM      Result Value Ref Range   Color, Urine AMBER (*) YELLOW   Comment: BIOCHEMICALS MAY BE AFFECTED BY COLOR   APPearance CLOUDY (*) CLEAR   Specific Gravity, Urine 1.034 (*) 1.005 - 1.030   pH 5.5  5.0 - 8.0   Glucose, UA NEGATIVE  NEGATIVE mg/dL   Hgb urine dipstick MODERATE (*) NEGATIVE   Bilirubin Urine NEGATIVE  NEGATIVE   Ketones, ur NEGATIVE  NEGATIVE mg/dL   Protein, ur 100 (*) NEGATIVE mg/dL   Urobilinogen, UA 1.0  0.0 - 1.0 mg/dL   Nitrite NEGATIVE  NEGATIVE   Leukocytes, UA NEGATIVE  NEGATIVE  URINE CULTURE     Status: None   Collection Time    10/05/14  5:26 PM      Result Value Ref Range   Specimen Description URINE, RANDOM     Special Requests NONE     Culture  Setup Time       Value: 10/06/2014 00:16     Performed at [place]       Value: NO GROWTH     Performed at [facility]   Culture       Value: NO GROWTH     Performed at [facility]   Report Status 10/07/2014 FINAL    URINE MICROSCOPIC-ADD ON     Status: Abnormal   Collection Time    10/05/14  5:26 PM      Result Value Ref Range   WBC, UA 0-2  <3 WBC/hpf   Bacteria, UA RARE  RARE   Casts HYALINE CASTS (*) NEGATIVE   Comment: GRANULAR CAST   Crystals CA OXALATE CRYSTALS (*) NEGATIVE   Urine-Other AMORPHOUS URATES/PHOSPHATES    WOUND CULTURE     Status: None   Collection Time    10/05/14  5:56 PM      Result Value Ref Range   Specimen Description WOUND FOOT     Special Requests NONE     Gram Stain       Value: NO WBC SEEN     NO SQUAMOUS EPITHELIAL CELLS SEEN     NO ORGANISMS SEEN     Performed at [facility]   Culture       Value: Culture reincubated for better growth     Performed at [facility]   Report Status PENDING    PHOSPHORUS     Status: None   Collection Time    10/05/14  6:46 PM      Result Value Ref Range   Phosphorus 2.3  2.3 - 4.6 mg/dL  MAGNESIUM     Status: Abnormal   Collection Time    10/05/14  6:46 PM      Result Value Ref Range   Magnesium 1.0 (*) 1.5 - 2.5 mg/dL  I-STAT CG4 LACTIC ACID, ED     Status: None   Collection Time    10/05/14  6:49 PM      Result Value Ref Range  Lactic Acid, Venous 1.70  0.5 - 2.2 mmol/L  BLOOD GAS, ARTERIAL     Status: Abnormal   Collection Time    10/05/14  7:19 PM      Result Value Ref Range   FIO2 0.21     pH, Arterial 7.325 (*) 7.350 - 7.450   pCO2 arterial 43.1  35.0 - 45.0 mmHg   pO2, Arterial 71.4 (*) 80.0 - 100.0 mmHg   Bicarbonate 21.2  20.0 - 24.0 mEq/L   TCO2 19.9  0 - 100 mmol/L   Acid-base deficit 3.4 (*) 0.0 - 2.0 mmol/L   O2 Saturation 91.3     Patient temperature 102.1     Collection site LEFT RADIAL     Drawn by [phone]     Sample type ARTERIAL DRAW     Allens test (pass/fail) PASS  PASS  MRSA PCR  SCREENING     Status: None   Collection Time    10/05/14  8:48 PM      Result Value Ref Range   MRSA by PCR NEGATIVE  NEGATIVE   Comment:            The GeneXpert MRSA Assay (FDA     approved for NASAL specimens     only), is one component of a     comprehensive MRSA colonization     surveillance program. It is not     intended to diagnose MRSA     infection nor to guide or     monitor treatment for     MRSA infections.  URINE CULTURE     Status: None   Collection Time    10/05/14  9:09 PM      Result Value Ref Range   Specimen Description URINE, CATHETERIZED     Special Requests NONE     Culture  Setup Time       Value: 10/06/2014 03:13     Performed at [place] [facility]       Value: NO GROWTH     Performed at [facility]   Culture       Value: NO GROWTH     Performed at [facility]   Report Status 10/07/2014 FINAL    COMPREHENSIVE METABOLIC PANEL     Status: Abnormal   Collection Time    10/06/14  7:50 AM      Result Value Ref Range   Sodium 139  137 - 147 mEq/L   Potassium 3.8  3.7 - 5.3 mEq/L   Chloride 102  96 - 112 mEq/L   CO2 22  19 - 32 mEq/L   Glucose, Bld 103 (*) 70 - 99 mg/dL   BUN 25 (*) 6 - 23 mg/dL   Creatinine, Ser 1.67 (*) 0.50 - 1.35 mg/dL   Calcium 8.2 (*) 8.4 - 10.5 mg/dL   Total Protein 6.0  6.0 - 8.3 g/dL   Albumin 2.3 (*) 3.5 - 5.2 g/dL   AST 25  0 - 37 U/L   ALT 17  0 - 53 U/L   Alkaline Phosphatase 71  39 - 117 U/L   Total Bilirubin 0.8  0.3 - 1.2 mg/dL   GFR calc non Af Amer 36 (*) >90 mL/min   GFR calc Af Amer 42 (*) >90 mL/min   Comment: (NOTE)     The eGFR has been calculated using the CKD EPI equation.     This calculation has not been validated in all  clinical situations.     eGFR's persistently <90 mL/min signify possible Chronic Kidney     Disease.   Anion gap 15  5 - 15  CBC WITH DIFFERENTIAL     Status: Abnormal   Collection Time    10/06/14  7:50 AM      Result Value Ref Range   WBC  10.3  4.0 - 10.5 K/uL   RBC 3.93 (*) 4.22 - 5.81 MIL/uL   Hemoglobin 10.6 (*) 13.0 - 17.0 g/dL   HCT 32.5 (*) 39.0 - 52.0 %   MCV 82.7  78.0 - 100.0 fL   MCH 27.0  26.0 - 34.0 pg   MCHC 32.6  30.0 - 36.0 g/dL   RDW 14.8  11.5 - 15.5 %   Platelets 126 (*) 150 - 400 K/uL   Neutrophils Relative % 82 (*) 43 - 77 %   Neutro Abs 8.5 (*) 1.7 - 7.7 K/uL   Lymphocytes Relative 8 (*) 12 - 46 %   Lymphs Abs 0.8  0.7 - 4.0 K/uL   Monocytes Relative 8  3 - 12 %   Monocytes Absolute 0.8  0.1 - 1.0 K/uL   Eosinophils Relative 2  0 - 5 %   Eosinophils Absolute 0.2  0.0 - 0.7 K/uL   Basophils Relative 0  0 - 1 %   Basophils Absolute 0.0  0.0 - 0.1 K/uL  TSH     Status: None   Collection Time    10/06/14  7:50 AM      Result Value Ref Range   TSH 0.433  0.350 - 4.500 uIU/mL   Comment: Performed at [place], CAPILLARY     Status: None   Collection Time    10/06/14  8:08 AM      Result Value Ref Range   Glucose-Capillary 94  70 - 99 mg/dL   Comment 1 Documented in Chart     Comment 2 Notify RN    CULTURE, BLOOD (ROUTINE X 2)     Status: None   Collection Time    10/06/14 12:43 PM      Result Value Ref Range   Specimen Description BLOOD RIGHT WRIST     Special Requests BOTTLES DRAWN AEROBIC ONLY 2 CC     Culture  Setup Time       Value: 10/06/2014 14:49     Performed at [facility]   Culture       Value:        BLOOD CULTURE RECEIVED NO GROWTH TO DATE CULTURE WILL BE HELD FOR 5 DAYS BEFORE ISSUING A FINAL NEGATIVE REPORT     Performed at [facility]   Report Status PENDING    TROPONIN I     Status: None   Collection Time    10/06/14 12:43 PM      Result Value Ref Range   Troponin I <0.30  <0.30 ng/mL   Comment:            Due to the release kinetics of cTnI,     a negative result within the first hours     of the onset of symptoms does not rule out     myocardial infarction with certainty.     If myocardial infarction is still suspected,      repeat the test at appropriate intervals.  GLUCOSE, CAPILLARY     Status: Abnormal   Collection Time    10/06/14 12:43 PM  Result Value Ref Range   Glucose-Capillary 181 (*) 70 - 99 mg/dL   Comment 1 Documented in Chart     Comment 2 Notify RN    CULTURE, BLOOD (ROUTINE X 2)     Status: None   Collection Time    10/06/14  3:05 PM      Result Value Ref Range   Specimen Description BLOOD LEFT HAND     Special Requests BOTTLES DRAWN AEROBIC AND ANAEROBIC  5 CC     Culture  Setup Time       Value: 10/06/2014 21:03     Performed at [facility]   Culture       Value:        BLOOD CULTURE RECEIVED NO GROWTH TO DATE CULTURE WILL BE HELD FOR 5 DAYS BEFORE ISSUING A FINAL NEGATIVE REPORT     Performed at [facility]   Report Status PENDING    GLUCOSE, CAPILLARY     Status: Abnormal   Collection Time    10/06/14  5:35 PM      Result Value Ref Range   Glucose-Capillary 126 (*) 70 - 99 mg/dL   Comment 1 Documented in Chart     Comment 2 Notify RN    GLUCOSE, CAPILLARY     Status: Abnormal   Collection Time    10/06/14  7:48 PM      Result Value Ref Range   Glucose-Capillary 130 (*) 70 - 99 mg/dL   Comment 1 Notify RN     Comment 2 Documented in Chart    HEMOGLOBIN A1C     Status: Abnormal   Collection Time    10/07/14  3:50 AM      Result Value Ref Range   Hemoglobin A1C 6.4 (*) <5.7 %   Comment: (NOTE)                                                                               According to the ADA Clinical Practice Recommendations for 2011, when     HbA1c is used as a screening test:      >=6.5%   Diagnostic of Diabetes Mellitus               (if abnormal result is confirmed)     5.7-6.4%   Increased risk of developing Diabetes Mellitus     References:Diagnosis and Classification of Diabetes Mellitus,Diabetes     Care,2011,34(Suppl 1):S62-S69 and Standards of Medical Care in             Diabetes - 2011,Diabetes Care,2011,34 (Suppl 1):S11-S61.   Mean  Plasma Glucose 137 (*) <117 mg/dL   Comment: Performed at [place] (ROUTINE TESTING)     Status: None   Collection Time    10/07/14  3:50 AM      Result Value Ref Range   HIV 1&2 Ab, 4th Generation NONREACTIVE  NONREACTIVE   Comment: (NOTE)     A NONREACTIVE HIV Ag/Ab result does not exclude HIV infection since     the time frame for seroconversion is variable. If acute HIV infection     is suspected, a HIV-1 RNA Qualitative TMA  test is recommended.     HIV-1/2 Antibody Diff         Not indicated.     HIV-1 RNA, Qual TMA           Not indicated.     PLEASE NOTE: This information has been disclosed to you from records     whose confidentiality may be protected by state law. If your state     requires such protection, then the state law prohibits you from making     any further disclosure of the information without the specific written     consent of the person to whom it pertains, or as otherwise permitted     by law. A general authorization for the release of medical or other     information is NOT sufficient for this purpose.     The performance of this assay has not been clinically validated in     patients less than 56 years old.     Performed at [place]     Status: Abnormal   Collection Time    10/07/14  3:50 AM      Result Value Ref Range   Sed Rate 90 (*) 0 - 16 mm/hr  C-REACTIVE PROTEIN     Status: Abnormal   Collection Time    10/07/14  3:50 AM      Result Value Ref Range   CRP 19.2 (*) <0.60 mg/dL   Comment: Performed at [place]     Status: Abnormal   Collection Time    10/07/14  3:50 AM      Result Value Ref Range   Sodium 137  137 - 147 mEq/L   Potassium 3.4 (*) 3.7 - 5.3 mEq/L   Chloride 101  96 - 112 mEq/L   CO2 24  19 - 32 mEq/L   Glucose, Bld 140 (*) 70 - 99 mg/dL   BUN 23  6 - 23 mg/dL   Creatinine, Ser 1.50 (*) 0.50 - 1.35 mg/dL   Calcium 8.1 (*) 8.4 -  10.5 mg/dL   Total Protein 5.9 (*) 6.0 - 8.3 g/dL   Albumin 2.2 (*) 3.5 - 5.2 g/dL   AST 25  0 - 37 U/L   ALT 18  0 - 53 U/L   Alkaline Phosphatase 77  39 - 117 U/L   Total Bilirubin 0.4  0.3 - 1.2 mg/dL   GFR calc non Af Amer 41 (*) >90 mL/min   GFR calc Af Amer 48 (*) >90 mL/min   Comment: (NOTE)     The eGFR has been calculated using the CKD EPI equation.     This calculation has not been validated in all clinical situations.     eGFR's persistently <90 mL/min signify possible Chronic Kidney     Disease.   Anion gap 12  5 - 15  CBC WITH DIFFERENTIAL     Status: Abnormal   Collection Time    10/07/14  3:50 AM      Result Value Ref Range   WBC 8.5  4.0 - 10.5 K/uL   RBC 3.79 (*) 4.22 - 5.81 MIL/uL   Hemoglobin 10.3 (*) 13.0 - 17.0 g/dL   HCT 30.9 (*) 39.0 - 52.0 %   MCV 81.5  78.0 - 100.0 fL   MCH 27.2  26.0 - 34.0 pg   MCHC 33.3  30.0 - 36.0 g/dL   RDW 14.8  11.5 -  15.5 %   Platelets 144 (*) 150 - 400 K/uL   Neutrophils Relative % 77  43 - 77 %   Neutro Abs 6.6  1.7 - 7.7 K/uL   Lymphocytes Relative 12  12 - 46 %   Lymphs Abs 1.0  0.7 - 4.0 K/uL   Monocytes Relative 8  3 - 12 %   Monocytes Absolute 0.7  0.1 - 1.0 K/uL   Eosinophils Relative 3  0 - 5 %   Eosinophils Absolute 0.2  0.0 - 0.7 K/uL   Basophils Relative 0  0 - 1 %   Basophils Absolute 0.0  0.0 - 0.1 K/uL  PHOSPHORUS     Status: None   Collection Time    10/07/14  3:50 AM      Result Value Ref Range   Phosphorus 2.3  2.3 - 4.6 mg/dL  MAGNESIUM     Status: Abnormal   Collection Time    10/07/14  3:50 AM      Result Value Ref Range   Magnesium 1.4 (*) 1.5 - 2.5 mg/dL  GLUCOSE, CAPILLARY     Status: Abnormal   Collection Time    10/07/14  8:18 AM      Result Value Ref Range   Glucose-Capillary 123 (*) 70 - 99 mg/dL   Comment 1 Documented in Chart     Comment 2 Notify RN    GLUCOSE, CAPILLARY     Status: Abnormal   Collection Time    10/07/14 12:06 PM      Result Value Ref Range   Glucose-Capillary  185 (*) 70 - 99 mg/dL    Mri Right Foot Without Contrast  10/07/2014   CLINICAL DATA:  Osteomyelitis of the heel. Subsequent encounter. Ulceration on the plantar aspect of the heel. RIGHT lower extremity redness and swelling.  EXAM: MRI OF THE RIGHT FOOT WITHOUT CONTRAST  TECHNIQUE: Multiplanar, multisequence MR imaging of the ankle was performed. No intravenous contrast was administered.  COMPARISON:  None.  FINDINGS: Diffuse soft tissue edema is present in the distal leg and ankle, which is nonspecific but may represent cellulitis in the appropriate clinical setting. 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  TENDONS  Peroneal: Intact.  Posteromedial: Intact.  Anterior: Intact.  Achilles: Intact.  Plantar Fascia: Normal.  LIGAMENTS  Lateral: Normal.  Medial: Normal.  CARTILAGE  Ankle Joint: Normal.  Subtalar Joints/Sinus Tarsi: Normal.  Bones: Ulcer is present over the plantar aspect of the calcaneus. There is phlegmon in the soft tissues extending up to the plantar surface of the calcaneus, with a small area of cortical osteolysis and marrow edema compatible with osteomyelitis. No abscess. The area of osteomyelitis is just proximal to the plantar fascia origin.  IMPRESSION: Tiny focus of osteomyelitis in the plantar aspect of the calcaneus deep to the ulcer. Diffuse edema compatible with cellulitis.   Electronically Signed   By: Dereck Ligas M.D.   On: 10/07/2014 08:42   US Aorta  10/06/2014   CLINICAL DATA:  Aortic ectasia.  History of hypertension.  EXAM: ULTRASOUND OF ABDOMINAL AORTA  TECHNIQUE: Ultrasound examination of the abdominal aorta was performed to evaluate for abdominal aortic aneurysm.  COMPARISON:  None.  FINDINGS: Limited visibility of the proximal aorta due to increased bowel gas and patient body habitus.  Abdominal Aorta  Some atherosclerotic calcifications are noted in the abdominal aorta.  Maximum AP  Diameter: 3.3 cm maximum anterior to posterior diameter in the proximal  abdominal aorta.  Maximum TRV  Diameter: 2.9 cm in the mid abdominal aorta.  Right common iliac artery:   Measures a maximum diameter of 1.3 cm.  Left common iliac artery:   Measures a maximum diameter of 1.3 cm.  IMPRESSION: Maximum AP diameter of the abdominal aorta is 3.3 cm, in the proximal abdominal aorta. This is consistent with mild aneurysmal dilatation. Atherosclerotic calcification is noted in the abdominal aorta.  Common iliac arteries are within normal limits for maximum diameter.   Electronically Signed   By: Curlene Dolphin M.D.   On: 10/06/2014 20:15   Dg Chest Port 1 View  10/05/2014   CLINICAL DATA:  Shortness of breath, cellulitis, COPD  EXAM: PORTABLE CHEST - 1 VIEW  COMPARISON:  08/28/2014  FINDINGS: There is bilateral mild increased interstitial thickening. There are trace bilateral pleural effusions. There is no pneumothorax. There is stable cardiomegaly. The osseous structures are unremarkable.  IMPRESSION: Overall findings concerning for mild CHF.   Electronically Signed   By: Kathreen Devoid   On: 10/05/2014 17:22   Dg Foot Complete Right  10/05/2014   CLINICAL DATA:  Ulceration on the plantar surface of the right heel.  EXAM: RIGHT FOOT COMPLETE - 3+ VIEW  COMPARISON:  Plain films 08/28/2014 and MRI 09/01/2014.  FINDINGS: Soft tissues of the foot are diffusely and severely swollen. Skin ulceration is seen over the plantar surface of the calcaneus. No underlying soft tissue gas collection, bony destructive change, radiopaque foreign body or periostitis is identified. No fracture or dislocation.  IMPRESSION: Skin ulceration on the heel without plain film evidence of osteomyelitis.  Severe soft tissue swelling diffusely about the foot.   Electronically Signed   By: Inge Rise M.D.   On: 10/05/2014 17:54    ROS:  General malaise, but no n/v/c.  + fever and drainage from the right foot wound. PE:  Blood pressure 153/66, pulse 174, temperature 97.9 F (36.6 C), temperature source  Oral, resp. rate 26, height 6' (1.829 m), weight 111.4 kg (245 lb 9.5 oz), SpO2 94.00%. Elderly male in nad.  A and o x4.  Mood and affect normal.  EOMI.  Resp unlabored.  R LE with significant lymphedema.  Skin intact without cellulitis.  1.5 cm ulcer at plantar heel centrally.  Good granulation tissue present.  No exposed bone.  Serous drainage.  5/5 strength in PF and DF.  No other ulcers.  No lymphadenopathy.  No palpable pulses but cap refill at toes is brisk.  Sens to LT at the plantar foot is diminished.    Assessment/Plan: Plantar foot ulcer with underlying osteomyelitis in the setting of chronic lymphedema and PVD.  This is quite a complicated problem in a patient generally in poor health with complex medical problems.  Definitive treatment of this calcaneus infection would require at a minimum partial calcanectomy with prolonged use of a wound VAC to close the resultant plantar foot wound.  This has a poor likelihood of healing given the compromised vascularity and would keep him off the foot for a prolonged period of time.  As an alternative a BK amputation could be considered but would also likely have difficulty healing and may ultimately require AKA.  Either choice would severely limit his function since he's unlikely to ever ambulate with a prosthesis.  A final alternative would be suppression with IV and then oral abx for the foreseeable future. This is probably the option that will allow him to be the most mobile.  He'll think these options over, and I'll  check on him tomorrow.  Wylene Simmer 10/07/2014, 3:39 PM

## 2014-10-08 ENCOUNTER — Telehealth: Payer: Self-pay | Admitting: Vascular Surgery

## 2014-10-08 DIAGNOSIS — I350 Nonrheumatic aortic (valve) stenosis: Secondary | ICD-10-CM

## 2014-10-08 LAB — CBC WITH DIFFERENTIAL/PLATELET
BASOS PCT: 0 % (ref 0–1)
Basophils Absolute: 0 10*3/uL (ref 0.0–0.1)
Eosinophils Absolute: 0.3 10*3/uL (ref 0.0–0.7)
Eosinophils Relative: 3 % (ref 0–5)
HEMATOCRIT: 32 % — AB (ref 39.0–52.0)
Hemoglobin: 10.6 g/dL — ABNORMAL LOW (ref 13.0–17.0)
LYMPHS ABS: 1.2 10*3/uL (ref 0.7–4.0)
LYMPHS PCT: 13 % (ref 12–46)
MCH: 26.8 pg (ref 26.0–34.0)
MCHC: 33.1 g/dL (ref 30.0–36.0)
MCV: 81 fL (ref 78.0–100.0)
MONO ABS: 0.8 10*3/uL (ref 0.1–1.0)
MONOS PCT: 9 % (ref 3–12)
Neutro Abs: 6.7 10*3/uL (ref 1.7–7.7)
Neutrophils Relative %: 75 % (ref 43–77)
Platelets: 159 10*3/uL (ref 150–400)
RBC: 3.95 MIL/uL — AB (ref 4.22–5.81)
RDW: 14.6 % (ref 11.5–15.5)
WBC: 8.9 10*3/uL (ref 4.0–10.5)

## 2014-10-08 LAB — GLUCOSE, CAPILLARY
GLUCOSE-CAPILLARY: 173 mg/dL — AB (ref 70–99)
Glucose-Capillary: 137 mg/dL — ABNORMAL HIGH (ref 70–99)
Glucose-Capillary: 127 mg/dL — ABNORMAL HIGH (ref 70–99)
Glucose-Capillary: 212 mg/dL — ABNORMAL HIGH (ref 70–99)

## 2014-10-08 LAB — CULTURE, BLOOD (ROUTINE X 2)

## 2014-10-08 LAB — WOUND CULTURE: Gram Stain: NONE SEEN

## 2014-10-08 LAB — COMPREHENSIVE METABOLIC PANEL
ALBUMIN: 2.2 g/dL — AB (ref 3.5–5.2)
ALK PHOS: 86 U/L (ref 39–117)
ALT: 17 U/L (ref 0–53)
ANION GAP: 12 (ref 5–15)
AST: 28 U/L (ref 0–37)
BUN: 19 mg/dL (ref 6–23)
CALCIUM: 8.3 mg/dL — AB (ref 8.4–10.5)
CO2: 26 mEq/L (ref 19–32)
Chloride: 101 mEq/L (ref 96–112)
Creatinine, Ser: 1.5 mg/dL — ABNORMAL HIGH (ref 0.50–1.35)
GFR calc Af Amer: 48 mL/min — ABNORMAL LOW (ref >90)
GFR calc non Af Amer: 41 mL/min — ABNORMAL LOW (ref >90)
Glucose, Bld: 124 mg/dL — ABNORMAL HIGH (ref 70–99)
POTASSIUM: 3.3 meq/L — AB (ref 3.7–5.3)
SODIUM: 139 meq/L (ref 137–147)
Total Bilirubin: 0.3 mg/dL (ref 0.3–1.2)
Total Protein: 5.8 g/dL — ABNORMAL LOW (ref 6.0–8.3)

## 2014-10-08 MED ORDER — POTASSIUM CHLORIDE CRYS ER 20 MEQ PO TBCR
20.0000 meq | EXTENDED_RELEASE_TABLET | Freq: Two times a day (BID) | ORAL | Status: DC
  Administered 2014-10-08 – 2014-10-09 (×3): 20 meq via ORAL
  Filled 2014-10-08 (×5): qty 1

## 2014-10-08 MED ORDER — LINAGLIPTIN 5 MG PO TABS
5.0000 mg | ORAL_TABLET | Freq: Every day | ORAL | Status: DC
  Administered 2014-10-08 – 2014-10-09 (×2): 5 mg via ORAL
  Filled 2014-10-08 (×2): qty 1

## 2014-10-08 MED ORDER — ATORVASTATIN CALCIUM 10 MG PO TABS
10.0000 mg | ORAL_TABLET | Freq: Every day | ORAL | Status: DC
  Administered 2014-10-08: 10 mg via ORAL
  Filled 2014-10-08 (×3): qty 1

## 2014-10-08 MED ORDER — DILTIAZEM HCL ER COATED BEADS 180 MG PO CP24
360.0000 mg | ORAL_CAPSULE | Freq: Every day | ORAL | Status: DC
  Administered 2014-10-08 – 2014-10-09 (×2): 360 mg via ORAL
  Filled 2014-10-08 (×2): qty 2
  Filled 2014-10-08: qty 1

## 2014-10-08 MED ORDER — FENTANYL 25 MCG/HR TD PT72
25.0000 ug | MEDICATED_PATCH | TRANSDERMAL | Status: DC
  Administered 2014-10-08: 25 ug via TRANSDERMAL
  Filled 2014-10-08: qty 1

## 2014-10-08 NOTE — Progress Notes (Signed)
CARE MANAGEMENT NOTE 10/08/2014  Patient:  [name], [name]   Account Number:  [id number]  Date Initiated:  10/06/2014  Documentation initiated by:  DAVIS,RHONDA  Subjective/Objective Assessment:   pt with sepsis from foot ulcer of a know diabetic     Action/Plan:   home when stable   Anticipated DC Date:  10/09/2014   Anticipated DC Plan:  [place]  In-house referral  NA      DC Planning Services  CM consult      [facility] Choice  NA   Choice offered to / List presented to:  NA        HH arranged  HH-1 RN  [place].   Status of service:  In process, will continue to follow Medicare Important Message given?  NA - LOS <3 / Initial given by admissions (If response is "NO", the following Medicare IM given date fields will be blank) Date Medicare IM given:  10/08/2014 Medicare IM given by:  Karl Bales Date Additional Medicare IM given:   Additional Medicare IM given by:    Discharge Disposition:    Per UR Regulation:  Reviewed for med. necessity/level of care/duration of stay  If discussed at [place] of Stay Meetings, dates discussed:    Comments:  10/08/14 [email or web address], RN, BSN Spoke with pt concerning home health pt selected [facility] for [facility]. Will need orders for HHRN/NA/PT/SW. Thanks   [id number] Rosana Hoes, RN, BSN, CCM Chart reviewed. Discharge needs and patient's stay to be reviewed and followed by case manager

## 2014-10-08 NOTE — Progress Notes (Signed)
Due to intermittent central telemetry monitor outages. RN provided one on one care to pt. Afib noted HR 80s-100s.. Pt is stable, visitor in room.

## 2014-10-08 NOTE — Progress Notes (Signed)
Subjective: Admitted c Sepsis/Osteomyelitis from Heel Ulcer. Now on 15 Cardizem - HRs 78-105 - Will change to PO Cardizem.  Appreciate Cards input of MAT - not AFib Foot hurts if touched. Appreciate Dr Hewitt's input as well.  Objective: Vital signs in last 24 hours: Temp:  [98 F (36.7 C)-98.6 F (37 C)] 98.6 F (37 C) (10/21 0505) Pulse Rate:  [78-105] 78 (10/21 0505) Resp:  [18-22] 18 (10/21 0505) BP: (147-151)/(62-89) 150/62 mmHg (10/21 0505) SpO2:  [92 %-94 %] 92 % (10/21 0759) Weight:  [106 kg (233 lb 11 oz)] 106 kg (233 lb 11 oz) (10/21 0505) Weight change:  Last BM Date: 10/07/14  CBG (last 3)   Recent Labs  10/07/14 1644 10/07/14 2140 10/08/14 0746  GLUCAP 159* 181* 137*    Intake/Output from previous day:  Intake/Output Summary (Last 24 hours) at 10/08/14 0900 Last data filed at 10/08/14 0600  Gross per 24 hour  Intake   1030 ml  Output   4826 ml  Net  -3796 ml   10/20 0701 - 10/21 0700 In: 1030 [P.O.:600; I.V.:330; IV Piggyback:100] Out: 4826 [Urine:4825; Stool:1]   Physical Exam  General appearance: Alert and O Eyes: no scleral icterus Throat: oropharynx moist without erythema Resp: distant, mostly clear Cardio: irreg/Irreg plus PVCs.  Murmur GI: soft, non-tender; bowel sounds normal; no masses,  no organomegaly Extremities: R heel c some cellulitis.  ++ Edema.  Ulcer c foul odor drainage   Lab Results:  Recent Labs  10/05/14 1846  10/07/14 0350 10/08/14 0407  NA  --   < > 137 139  K  --   < > 3.4* 3.3*  CL  --   < > 101 101  CO2  --   < > 24 26  GLUCOSE  --   < > 140* 124*  BUN  --   < > 23 19  CREATININE  --   < > 1.50* 1.50*  CALCIUM  --   < > 8.1* 8.3*  MG 1.0*  --  1.4*  --   PHOS 2.3  --  2.3  --   < > = values in this interval not displayed.   Recent Labs  10/07/14 0350 10/08/14 0407  AST 25 28  ALT 18 17  ALKPHOS 77 86  BILITOT 0.4 0.3  PROT 5.9* 5.8*  ALBUMIN 2.2* 2.2*     Recent Labs  10/07/14 0350  10/08/14 0407  WBC 8.5 8.9  NEUTROABS 6.6 6.7  HGB 10.3* 10.6*  HCT 30.9* 32.0*  MCV 81.5 81.0  PLT 144* 159    Lab Results  Component Value Date   INR 1.04 09/01/2014   INR 1.18 08/28/2014   INR 1.07 01/03/2013     Recent Labs  10/05/14 1619 10/06/14 1243  TROPONINI <0.30 <0.30     Recent Labs  10/06/14 0750  TSH 0.433    No results found for this basename: VITAMINB12, FOLATE, FERRITIN, TIBC, IRON, RETICCTPCT,  in the last 72 hours  Micro Results: Recent Results (from the past 240 hour(s))  CULTURE, BLOOD (ROUTINE X 2)     Status: None   Collection Time    10/05/14  3:27 PM      Result Value Ref Range Status   Specimen Description BLOOD LEFT FOREARM   Final   Special Requests BOTTLES DRAWN AEROBIC ONLY 3CC   Final   Culture  Setup Time     Final   Value: 10/05/2014 18:50  Performed at [facility]     Final   Value: GROUP B STREP(S.AGALACTIAE)ISOLATED     Note: Gram Stain Report Called to,Read Back By and Verified With: JACLYN T. @ 10.25AM 10.19.15     Performed at [facility]   Report Status 10/08/2014 FINAL   Final   Organism ID, Bacteria GROUP B STREP(S.AGALACTIAE)ISOLATED   Final  CULTURE, BLOOD (ROUTINE X 2)     Status: None   Collection Time    10/05/14  3:28 PM      Result Value Ref Range Status   Specimen Description BLOOD LEFT ANTECUBITAL   Final   Special Requests BOTTLES DRAWN AEROBIC AND ANAEROBIC 3CC   Final   Culture  Setup Time     Final   Value: 10/05/2014 18:50     Performed at [facility]   Culture     Final   Value: GROUP B STREP(S.AGALACTIAE)ISOLATED     Note: SUSCEPTIBILITIES PERFORMED ON PREVIOUS CULTURE WITHIN THE LAST 5 DAYS.     Note: Gram Stain Report Called to,Read Back By and Verified With: Nancee Liter RN on 10/06/14 at 06:25 by Rise Mu     Performed at [facility]   Report Status 10/08/2014 FINAL   Final  URINE CULTURE     Status: None   Collection Time    10/05/14  5:26  PM      Result Value Ref Range Status   Specimen Description URINE, RANDOM   Final   Special Requests NONE   Final   Culture  Setup Time     Final   Value: 10/06/2014 00:16     Performed at [place]     Final   Value: NO GROWTH     Performed at [facility]   Culture     Final   Value: NO GROWTH     Performed at [facility]   Report Status 10/07/2014 FINAL   Final  WOUND CULTURE     Status: None   Collection Time    10/05/14  5:56 PM      Result Value Ref Range Status   Specimen Description WOUND FOOT   Final   Special Requests NONE   Final   Gram Stain     Final   Value: NO WBC SEEN     NO SQUAMOUS EPITHELIAL CELLS SEEN     NO ORGANISMS SEEN     Performed at [facility]   Culture     Final   Value: Culture reincubated for better growth     Performed at [facility]   Report Status PENDING   Incomplete  MRSA PCR SCREENING     Status: None   Collection Time    10/05/14  8:48 PM      Result Value Ref Range Status   MRSA by PCR NEGATIVE  NEGATIVE Final   Comment:            The GeneXpert MRSA Assay (FDA     approved for NASAL specimens     only), is one component of a     comprehensive MRSA colonization     surveillance program. It is not     intended to diagnose MRSA     infection nor to guide or     monitor treatment for     MRSA infections.  URINE CULTURE     Status: None  Collection Time    10/05/14  9:09 PM      Result Value Ref Range Status   Specimen Description URINE, CATHETERIZED   Final   Special Requests NONE   Final   Culture  Setup Time     Final   Value: 10/06/2014 03:13     Performed at [facility]     Final   Value: NO GROWTH     Performed at [facility]   Culture     Final   Value: NO GROWTH     Performed at [facility]   Report Status 10/07/2014 FINAL   Final  CULTURE, BLOOD (ROUTINE X 2)     Status: None   Collection Time    10/06/14  12:43 PM      Result Value Ref Range Status   Specimen Description BLOOD RIGHT WRIST   Final   Special Requests BOTTLES DRAWN AEROBIC ONLY 2 CC   Final   Culture  Setup Time     Final   Value: 10/06/2014 14:49     Performed at [facility]   Culture     Final   Value:        BLOOD CULTURE RECEIVED NO GROWTH TO DATE CULTURE WILL BE HELD FOR 5 DAYS BEFORE ISSUING A FINAL NEGATIVE REPORT     Performed at [facility]   Report Status PENDING   Incomplete  CULTURE, BLOOD (ROUTINE X 2)     Status: None   Collection Time    10/06/14  3:05 PM      Result Value Ref Range Status   Specimen Description BLOOD LEFT HAND   Final   Special Requests BOTTLES DRAWN AEROBIC AND ANAEROBIC  5 CC   Final   Culture  Setup Time     Final   Value: 10/06/2014 21:03     Performed at [facility]   Culture     Final   Value:        BLOOD CULTURE RECEIVED NO GROWTH TO DATE CULTURE WILL BE HELD FOR 5 DAYS BEFORE ISSUING A FINAL NEGATIVE REPORT     Performed at [facility]   Report Status PENDING   Incomplete     Studies/Results: Mri Right Foot Without Contrast  10/07/2014   CLINICAL DATA:  Osteomyelitis of the heel. Subsequent encounter. Ulceration on the plantar aspect of the heel. RIGHT lower extremity redness and swelling.  EXAM: MRI OF THE RIGHT FOOT WITHOUT CONTRAST  TECHNIQUE: Multiplanar, multisequence MR imaging of the ankle was performed. No intravenous contrast was administered.  COMPARISON:  None.  FINDINGS: Diffuse soft tissue edema is present in the distal leg and ankle, which is nonspecific but may represent cellulitis in the appropriate clinical setting. 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  TENDONS  Peroneal: Intact.  Posteromedial: Intact.  Anterior: Intact.  Achilles: Intact.  Plantar Fascia: Normal.  LIGAMENTS  Lateral: Normal.  Medial: Normal.  CARTILAGE  Ankle Joint: Normal.  Subtalar Joints/Sinus Tarsi: Normal.  Bones: Ulcer is present over the  plantar aspect of the calcaneus. There is phlegmon in the soft tissues extending up to the plantar surface of the calcaneus, with a small area of cortical osteolysis and marrow edema compatible with osteomyelitis. No abscess. The area of osteomyelitis is just proximal to the plantar fascia origin.  IMPRESSION: Tiny focus of osteomyelitis in the plantar aspect of the calcaneus deep to the ulcer. Diffuse edema  compatible with cellulitis.   Electronically Signed   By: Dereck Ligas M.D.   On: 10/07/2014 08:42   US Aorta  10/06/2014   CLINICAL DATA:  Aortic ectasia.  History of hypertension.  EXAM: ULTRASOUND OF ABDOMINAL AORTA  TECHNIQUE: Ultrasound examination of the abdominal aorta was performed to evaluate for abdominal aortic aneurysm.  COMPARISON:  None.  FINDINGS: Limited visibility of the proximal aorta due to increased bowel gas and patient body habitus.  Abdominal Aorta  Some atherosclerotic calcifications are noted in the abdominal aorta.  Maximum AP  Diameter: 3.3 cm maximum anterior to posterior diameter in the proximal abdominal aorta.  Maximum TRV  Diameter: 2.9 cm in the mid abdominal aorta.  Right common iliac artery:   Measures a maximum diameter of 1.3 cm.  Left common iliac artery:   Measures a maximum diameter of 1.3 cm.  IMPRESSION: Maximum AP diameter of the abdominal aorta is 3.3 cm, in the proximal abdominal aorta. This is consistent with mild aneurysmal dilatation. Atherosclerotic calcification is noted in the abdominal aorta.  Common iliac arteries are within normal limits for maximum diameter.   Electronically Signed   By: Curlene Dolphin M.D.   On: 10/06/2014 20:15     Medications: Scheduled: . aspirin EC  81 mg Oral q morning - 10a  . carbidopa-levodopa  1 tablet Oral QPM  . cefTRIAXone (ROCEPHIN)  IV  2 g Intravenous Q24H   And  . metroNIDAZOLE  500 mg Oral 3 times per day  . docusate sodium  100 mg Oral BID  . enoxaparin (LOVENOX) injection  0.5 mg/kg Subcutaneous Q24H  .  famotidine  20 mg Oral BID  . feeding supplement (ENSURE)  1 Container Oral TID BM  . gabapentin  300 mg Oral BID  . insulin aspart  0-9 Units Subcutaneous TID WC  . ipratropium-albuterol  3 mL Nebulization QID  . lactose free nutrition  237 mL Oral Q24H  . levothyroxine  112 mcg Oral q morning - 10a  . magnesium oxide  400 mg Oral Daily  . mometasone-formoterol  2 puff Inhalation BID  . mupirocin ointment  1 application Nasal BID  . saccharomyces boulardii  250 mg Oral BID  . simvastatin  20 mg Oral QHS  . torsemide  10 mg Oral Daily   Continuous: . sodium chloride 30 mL/hr at 10/08/14 0509  . diltiazem (CARDIZEM) infusion 15 mg/hr (10/08/14 0734)     Assessment/Plan: Active Problems:   Chronic kidney disease, stage III (moderate)   Sepsis   Multifocal atrial tachycardia   Hyperlipidemia   Obesity   Moderate aortic stenosis  Sepsis from R Heel Wound/Osteomyelitis. Fevers resolved, Leukoctosis resolved, Lactic Acidosis - better, Blood pressures now fine, Hypoxia/hypoventilation/obtundation already better.    Blood cultures + for GROUP B STREP(S.AGALACTIAE)ISOLATED - S/P Vanco/Rocephi/Flagyl from ID. MRI Showed: Tiny focus of osteomyelitis in the plantar aspect of the calcaneus deep to the ulcer. Diffuse edema compatible with cellulitis. Dr Hewitt's note stated Definitive treatment of this calcaneus infection would require at a minimum partial calcanectomy with prolonged use of a wound VAC to close the resultant plantar foot wound. This has a poor likelihood of healing given the compromised vascularity and would keep him off the foot for a prolonged period of time. As an alternative a BK amputation could be considered but would also likely have difficulty healing and may ultimately require AKA. Either choice would severely limit his function since he's unlikely to ever ambulate with a prosthesis. A  final alternative would be suppression with IV and then oral abx for the foreseeable  future. This is probably the option that will allow him to be the most mobile. Long Discussion this am @ Morbidity/Mortality and options. He is really leaning towards PICC IV followed by oral Abx.  He understands this may not work well and he may develop more Sepsis/Cellulitis or gangrene requiring a future amputation.  He realizes that he may die b/c of his med issues.  If this is the case he asked me to make sure he is comfortable. I discussed options c ID and will plan on PICC tomorrow c Ceftriaxone 2gm/day for 6 weeks with Amoxil afterwards for rest of life.  Hypersomnolence and lethargy - better post hydration, IVF and fentanyl off.  On prn Narcs for pain  Known PAD.  He had LE Arteriogram 9/14 = complete occlusion of his right superficial femoral artery at the origin with reconstitution of the above-knee popliteal. He has open tibial runoff initially but has extensive small vessel disease into his foot.  MRI 9/14 showed no definitive evidence of Osteo involvement of his calcaneus. Sed rate on 9/15 was 65.  Last saw Dr Donnetta Hutching 9/22 and they discussed right femoral to above-knee popliteal bypass if he has progression of tissue loss.  Seen By Vasc surgery yesterday and discussed options.  Repeat ABIs ABI / TBI  R 0.65 / 0.26 and L 0.86 / 0.33.  Await current MRI.  Discussion @ Revasc vrs amputation vrs more conservative options being had Min AAA  MAT c PACs/PVCs - Cardizem gtt to be transitioned to oral Cardizem CD 360.  Holding hep gtt - not needed.  Appreciate Cards help.  Had ECHO 08/2014 done c murmur - 08/2014 EF 65-70%. Mild LVH. Mod AS. Grade 1 Diastolic Dysfunction. Pulm HTN c PAP pressures 35mmHg.   Sacral Decub - Barrier cream to buttocks to protect and repel moisture.  Float heel to reduce pressure.   Volume overload c LE Edema in pt c Nml EF but known DD - Elevated Pro-BNP c nml Trop I - Diuretics restarted Low Albumin - Protein Malnutrition.  Mild Anemia - Hbg 10.3- 12.7  Mild  Thrombocytopenia - 126-159 Hypokalemia - Replete. Low Mag - Replete  DVT Proph ordered CBGs 137-185. A1C 6.2. CBGs BID.  Start Tradjenta.  CKD3 - Cr 1.50 - 1.83 COPD - On Rxs. Pulm Toilet.  HTN - BP fine to a little high RLS - Sinemet is home med Obesity-encourage weight loss  Moderate aortic stenosis-should not be of clinical consequence. Continue to monitor clinically.  Improve pain control. Will get PT/SW/CW and work towards D//c by Friday if possible. Will need offer of SNF vrs Home c HH and IV Abx.  Agree that he has a high Morbidity and Mortality regardless of what we do.   ID -  Anti-infectives   Start     Dose/Rate Route Frequency Ordered Stop   10/07/14 0600  vancomycin (VANCOCIN) 1,500 mg in sodium chloride 0.9 % 500 mL IVPB  Status:  Discontinued     1,500 mg 250 mL/hr over 120 Minutes Intravenous Every 24 hours 10/06/14 1057 10/07/14 1110   10/06/14 1400  metroNIDAZOLE (FLAGYL) tablet 500 mg     500 mg Oral 3 times per day 10/06/14 0959     10/06/14 1200  cefTRIAXone (ROCEPHIN) 2 g in dextrose 5 % 50 mL IVPB     2 g 100 mL/hr over 30 Minutes Intravenous Every 24 hours 10/06/14 0959  10/06/14 0500  vancomycin (VANCOCIN) IVPB 1000 mg/200 mL premix  Status:  Discontinued     1,000 mg 200 mL/hr over 60 Minutes Intravenous Every 12 hours 10/05/14 1700 10/06/14 1057   10/06/14 0000  piperacillin-tazobactam (ZOSYN) IVPB 3.375 g  Status:  Discontinued     3.375 g 12.5 mL/hr over 240 Minutes Intravenous Every 8 hours 10/05/14 1700 10/06/14 0954   10/05/14 1600  piperacillin-tazobactam (ZOSYN) IVPB 3.375 g     3.375 g 100 mL/hr over 30 Minutes Intravenous  Once 10/05/14 1556 10/05/14 1902   10/05/14 1600  vancomycin (VANCOCIN) IVPB 1000 mg/200 mL premix     1,000 mg 200 mL/hr over 60 Minutes Intravenous  Once 10/05/14 1556 10/05/14 1750       LOS: 3 days   Cadie Sorci M 10/08/2014, 9:00 AM

## 2014-10-08 NOTE — Telephone Encounter (Addendum)
Message copied by Doristine Section on Wed Oct 08, 2014  3:41 PM ------      Message from: [place], [place] K      Created: Wed Oct 08, 2014  2:51 PM      Regarding: schedule                   ----- Message -----         From: Conrad [place], MD         Sent: 10/08/2014   2:25 PM           To: [address]            [name]      [id number]      [id number]            R1 charge            Follow up appt 1-2 weeks with Dr. Donnetta Hutching  notified patient of fu appt. with dr. early on 10-28-14 3pm ------

## 2014-10-08 NOTE — Progress Notes (Signed)
[facility]  Day # 3 ceftriaxone Day # 3 flagyl 3 days  vancomycin  Subjective: No new complaints   Antibiotics:  Anti-infectives   Start     Dose/Rate Route Frequency Ordered Stop   10/07/14 0600  vancomycin (VANCOCIN) 1,500 mg in sodium chloride 0.9 % 500 mL IVPB  Status:  Discontinued     1,500 mg 250 mL/hr over 120 Minutes Intravenous Every 24 hours 10/06/14 1057 10/07/14 1110   10/06/14 1400  metroNIDAZOLE (FLAGYL) tablet 500 mg  Status:  Discontinued     500 mg Oral 3 times per day 10/06/14 0959 10/08/14 0945   10/06/14 1200  cefTRIAXone (ROCEPHIN) 2 g in dextrose 5 % 50 mL IVPB     2 g 100 mL/hr over 30 Minutes Intravenous Every 24 hours 10/06/14 0959     10/06/14 0500  vancomycin (VANCOCIN) IVPB 1000 mg/200 mL premix  Status:  Discontinued     1,000 mg 200 mL/hr over 60 Minutes Intravenous Every 12 hours 10/05/14 1700 10/06/14 1057   10/06/14 0000  piperacillin-tazobactam (ZOSYN) IVPB 3.375 g  Status:  Discontinued     3.375 g 12.5 mL/hr over 240 Minutes Intravenous Every 8 hours 10/05/14 1700 10/06/14 0954   10/05/14 1600  piperacillin-tazobactam (ZOSYN) IVPB 3.375 g     3.375 g 100 mL/hr over 30 Minutes Intravenous  Once 10/05/14 1556 10/05/14 1902   10/05/14 1600  vancomycin (VANCOCIN) IVPB 1000 mg/200 mL premix     1,000 mg 200 mL/hr over 60 Minutes Intravenous  Once 10/05/14 1556 10/05/14 1750      Medications: Scheduled Meds: . aspirin EC  81 mg Oral q morning - 10a  . atorvastatin  10 mg Oral QHS  . carbidopa-levodopa  1 tablet Oral QPM  . cefTRIAXone (ROCEPHIN)  IV  2 g Intravenous Q24H  . diltiazem  360 mg Oral Daily  . docusate sodium  100 mg Oral BID  . enoxaparin (LOVENOX) injection  0.5 mg/kg Subcutaneous Q24H  . famotidine  20 mg Oral BID  . feeding supplement (ENSURE)  1 Container Oral TID BM  . fentaNYL  25 mcg Transdermal Q72H  . gabapentin  300 mg Oral BID  . insulin aspart  0-9 Units Subcutaneous TID WC  .  ipratropium-albuterol  3 mL Nebulization QID  . lactose free nutrition  237 mL Oral Q24H  . levothyroxine  112 mcg Oral q morning - 10a  . linagliptin  5 mg Oral Daily  . magnesium oxide  400 mg Oral Daily  . mometasone-formoterol  2 puff Inhalation BID  . mupirocin ointment  1 application Nasal BID  . potassium chloride  20 mEq Oral BID  . saccharomyces boulardii  250 mg Oral BID  . torsemide  10 mg Oral Daily   Continuous Infusions: . sodium chloride 30 mL/hr at 10/08/14 0509  . diltiazem (CARDIZEM) infusion 15 mg/hr (10/08/14 0734)   PRN Meds:.acetaminophen, albuterol, clonazePAM, morphine injection, ondansetron (ZOFRAN) IV, ondansetron, oxyCODONE    Objective: Weight change:   Intake/Output Summary (Last 24 hours) at 10/08/14 1411 Last data filed at 10/08/14 0900  Gross per 24 hour  Intake   1150 ml  Output   4400 ml  Net  -3250 ml   Blood pressure 150/62, pulse 78, temperature 98.6 F (37 C), temperature source Oral, resp. rate 18, height 6' (1.829 m), weight 233 lb 11 oz (106 kg), SpO2 92.00%. Temp:  [98 F (36.7 C)-98.6 F (37 C)] 98.6 F (  37 C) (10/21 0505) Pulse Rate:  [78-105] 78 (10/21 0505) Resp:  [18-22] 18 (10/21 0505) BP: (147-151)/(62-89) 150/62 mmHg (10/21 0505) SpO2:  [92 %-94 %] 92 % (10/21 0759) Weight:  [233 lb 11 oz (106 kg)] 233 lb 11 oz (106 kg) (10/21 0505)  Physical Exam: General: Alert and awake, oriented x3, pale  HEENT: anicteric sclera, EOMI, oropharynx clear and without exudate  CVStachycardic, II/VI murmur at PMI  Chest: clear to auscultation bilaterally, no wheezing, rales or rhonchi  Abdomen: soft nontender, nondistended, normal bowel sounds,  Extremities/skin: edematous skin, DP not palpable  Right foot dressed  See pictures from yesterday none taken today   Neuro: nonfocal  CBC:  Recent Labs Lab 10/05/14 1527 10/06/14 0750 10/07/14 0350 10/08/14 0407  HGB 12.7* 10.6* 10.3* 10.6*  HCT 39.8 32.5* 30.9* 32.0*  PLT 158  126* 144* 159     BMET  Recent Labs  10/07/14 0350 10/08/14 0407  NA 137 139  K 3.4* 3.3*  CL 101 101  CO2 24 26  GLUCOSE 140* 124*  BUN 23 19  CREATININE 1.50* 1.50*  CALCIUM 8.1* 8.3*     Liver Panel   Recent Labs  10/07/14 0350 10/08/14 0407  PROT 5.9* 5.8*  ALBUMIN 2.2* 2.2*  AST 25 28  ALT 18 17  ALKPHOS 77 86  BILITOT 0.4 0.3       Sedimentation Rate  Recent Labs  10/07/14 0350  ESRSEDRATE 90*   C-Reactive Protein  Recent Labs  10/07/14 0350  CRP 19.2*    Micro Results: Recent Results (from the past 720 hour(s))  CULTURE, BLOOD (ROUTINE X 2)     Status: None   Collection Time    10/05/14  3:27 PM      Result Value Ref Range Status   Specimen Description BLOOD LEFT FOREARM   Final   Special Requests BOTTLES DRAWN AEROBIC ONLY 3CC   Final   Culture  Setup Time     Final   Value: 10/05/2014 18:50     Performed at [facility]   Culture     Final   Value: GROUP B STREP(S.AGALACTIAE)ISOLATED     Note: Gram Stain Report Called to,Read Back By and Verified With: JACLYN T. @ 10.25AM 10.19.15     Performed at [facility]   Report Status 10/08/2014 FINAL   Final   Organism ID, Bacteria GROUP B STREP(S.AGALACTIAE)ISOLATED   Final  CULTURE, BLOOD (ROUTINE X 2)     Status: None   Collection Time    10/05/14  3:28 PM      Result Value Ref Range Status   Specimen Description BLOOD LEFT ANTECUBITAL   Final   Special Requests BOTTLES DRAWN AEROBIC AND ANAEROBIC 3CC   Final   Culture  Setup Time     Final   Value: 10/05/2014 18:50     Performed at [facility]   Culture     Final   Value: GROUP B STREP(S.AGALACTIAE)ISOLATED     Note: SUSCEPTIBILITIES PERFORMED ON PREVIOUS CULTURE WITHIN THE LAST 5 DAYS.     Note: Gram Stain Report Called to,Read Back By and Verified With: Nancee Liter RN on 10/06/14 at 06:25 by Rise Mu     Performed at [facility]   Report Status 10/08/2014 FINAL   Final  URINE CULTURE      Status: None   Collection Time    10/05/14  5:26 PM      Result Value Ref  Range Status   Specimen Description URINE, RANDOM   Final   Special Requests NONE   Final   Culture  Setup Time     Final   Value: 10/06/2014 00:16     Performed at [place]     Final   Value: NO GROWTH     Performed at [facility]   Culture     Final   Value: NO GROWTH     Performed at [facility]   Report Status 10/07/2014 FINAL   Final  WOUND CULTURE     Status: None   Collection Time    10/05/14  5:56 PM      Result Value Ref Range Status   Specimen Description WOUND FOOT   Final   Special Requests NONE   Final   Gram Stain     Final   Value: NO WBC SEEN     NO SQUAMOUS EPITHELIAL CELLS SEEN     NO ORGANISMS SEEN     Performed at [facility]   Culture     Final   Value: MODERATE GROUP B STREP(S.AGALACTIAE)ISOLATED     Note: TESTING AGAINST S. AGALACTIAE NOT ROUTINELY PERFORMED DUE TO PREDICTABILITY OF AMP/PEN/VAN SUSCEPTIBILITY.     Performed at [facility]   Report Status 10/08/2014 FINAL   Final  MRSA PCR SCREENING     Status: None   Collection Time    10/05/14  8:48 PM      Result Value Ref Range Status   MRSA by PCR NEGATIVE  NEGATIVE Final   Comment:            The GeneXpert MRSA Assay (FDA     approved for NASAL specimens     only), is one component of a     comprehensive MRSA colonization     surveillance program. It is not     intended to diagnose MRSA     infection nor to guide or     monitor treatment for     MRSA infections.  URINE CULTURE     Status: None   Collection Time    10/05/14  9:09 PM      Result Value Ref Range Status   Specimen Description URINE, CATHETERIZED   Final   Special Requests NONE   Final   Culture  Setup Time     Final   Value: 10/06/2014 03:13     Performed at [place]     Final   Value: NO GROWTH     Performed at [facility]   Culture     Final    Value: NO GROWTH     Performed at [facility]   Report Status 10/07/2014 FINAL   Final  CULTURE, BLOOD (ROUTINE X 2)     Status: None   Collection Time    10/06/14 12:43 PM      Result Value Ref Range Status   Specimen Description BLOOD RIGHT WRIST   Final   Special Requests BOTTLES DRAWN AEROBIC ONLY 2 CC   Final   Culture  Setup Time     Final   Value: 10/06/2014 14:49     Performed at [facility]   Culture     Final   Value:        BLOOD CULTURE RECEIVED NO GROWTH TO DATE CULTURE WILL BE HELD FOR 5 DAYS BEFORE ISSUING  A FINAL NEGATIVE REPORT     Performed at [facility]   Report Status PENDING   Incomplete  CULTURE, BLOOD (ROUTINE X 2)     Status: None   Collection Time    10/06/14  3:05 PM      Result Value Ref Range Status   Specimen Description BLOOD LEFT HAND   Final   Special Requests BOTTLES DRAWN AEROBIC AND ANAEROBIC  5 CC   Final   Culture  Setup Time     Final   Value: 10/06/2014 21:03     Performed at [facility]   Culture     Final   Value:        BLOOD CULTURE RECEIVED NO GROWTH TO DATE CULTURE WILL BE HELD FOR 5 DAYS BEFORE ISSUING A FINAL NEGATIVE REPORT     Performed at [facility]   Report Status PENDING   Incomplete    Studies/Results: Mri Right Foot Without Contrast  10/07/2014   CLINICAL DATA:  Osteomyelitis of the heel. Subsequent encounter. Ulceration on the plantar aspect of the heel. RIGHT lower extremity redness and swelling.  EXAM: MRI OF THE RIGHT FOOT WITHOUT CONTRAST  TECHNIQUE: Multiplanar, multisequence MR imaging of the ankle was performed. No intravenous contrast was administered.  COMPARISON:  None.  FINDINGS: Diffuse soft tissue edema is present in the distal leg and ankle, which is nonspecific but may represent cellulitis in the appropriate clinical setting. 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  TENDONS  Peroneal: Intact.  Posteromedial: Intact.  Anterior: Intact.  Achilles: Intact.   Plantar Fascia: Normal.  LIGAMENTS  Lateral: Normal.  Medial: Normal.  CARTILAGE  Ankle Joint: Normal.  Subtalar Joints/Sinus Tarsi: Normal.  Bones: Ulcer is present over the plantar aspect of the calcaneus. There is phlegmon in the soft tissues extending up to the plantar surface of the calcaneus, with a small area of cortical osteolysis and marrow edema compatible with osteomyelitis. No abscess. The area of osteomyelitis is just proximal to the plantar fascia origin.  IMPRESSION: Tiny focus of osteomyelitis in the plantar aspect of the calcaneus deep to the ulcer. Diffuse edema compatible with cellulitis.   Electronically Signed   By: Dereck Ligas M.D.   On: 10/07/2014 08:42   US Aorta  10/06/2014   CLINICAL DATA:  Aortic ectasia.  History of hypertension.  EXAM: ULTRASOUND OF ABDOMINAL AORTA  TECHNIQUE: Ultrasound examination of the abdominal aorta was performed to evaluate for abdominal aortic aneurysm.  COMPARISON:  None.  FINDINGS: Limited visibility of the proximal aorta due to increased bowel gas and patient body habitus.  Abdominal Aorta  Some atherosclerotic calcifications are noted in the abdominal aorta.  Maximum AP  Diameter: 3.3 cm maximum anterior to posterior diameter in the proximal abdominal aorta.  Maximum TRV  Diameter: 2.9 cm in the mid abdominal aorta.  Right common iliac artery:   Measures a maximum diameter of 1.3 cm.  Left common iliac artery:   Measures a maximum diameter of 1.3 cm.  IMPRESSION: Maximum AP diameter of the abdominal aorta is 3.3 cm, in the proximal abdominal aorta. This is consistent with mild aneurysmal dilatation. Atherosclerotic calcification is noted in the abdominal aorta.  Common iliac arteries are within normal limits for maximum diameter.   Electronically Signed   By: Curlene Dolphin M.D.   On: 10/06/2014 20:15      Assessment/Plan:  Active Problems:   Chronic kidney disease, stage III (moderate)   Sepsis   Multifocal  atrial tachycardia    Hyperlipidemia   Obesity   Moderate aortic stenosis    [name] is a 80 y.o. male with Multiple medical problems including "prediabetes", extensive smoking PVD occluded superficial femoral with ulcer that has been not resolved over 6 month + period with recurrent episodes of purulent drainage now with septic shock from gGroup B streptococcal bacteremia    #1 Purulent foot ulcer with Group B streptococcal bacteremia in pt with PVD, prediabetes now with CALCANEAL OSTEO on MRI  [place] it seems pointless to consider revascularization, as such bone infections do not typically resolve without aggressive curative amputation. I suspect for cure he would need a BKA. I am VERY skeptical that IF he underwent re-vascularization AND debridement by Orthopedic surgery, followed by protracted IV and then oral abx that we could CURE this  I can certainly appreciate that the patient has anxiety about vascular surgery and also about orthopedic surgery and he is correct to want to weigh benefits of surgery or no surgery in terms of risk of surgery itself and weighing risks and benefits of losing his foot.  I am confident WE WILL NOT CURE THIS INFECTION ABSENT BKA and I am skeptical I am going to be able to even suppress this though I am certainly open to trying this I DO NOT THink it will succeed, it could buy him SOME TIME  Greatly appreciate VVS, Orthopedics, Cardiology, WOC, Nutrition, CM consults   Patient had extensive discussion with Dr Virgina Jock and he would wish to attempt to suppress infection in his foot with 6 weeks of IV abx followed by pills.  --would ensure repeat cultures remain with NG and can place PICC tomorrow  ---should receive 6 weeks of IV rocephin with day one being 10/05/14.  --I will switch him over to oral amoxicillin once he is through with his IV ceftriaxone   #2 Left sided foot ulcer: does not appear overtly infected, but have ordered  plain films since this  has not resolved over nearly a year  I will arrange HSFU in our clinic      LOS: 3 days   Alcide Evener 10/08/2014, 2:11 PM

## 2014-10-08 NOTE — Progress Notes (Signed)
   Daily Progress Note  Assessment/Planning: RLE critical limb ischemia with chronic heel wound, known moderate to severe Left leg peripheral arterial disease, small AAA   Recommended: R CFA to AK pop bypass with GSV with continue IV abx.  Pt only interested in IV abx at this point.  Pt is aware of limb loss risk.  Pt can follow up with Dr. Donnetta Hutching in the office  Subjective    No complaints  Objective Filed Vitals:   10/07/14 1958 10/07/14 2137 10/08/14 0505 10/08/14 0759  BP:  151/87 150/62   Pulse:  79 78   Temp:  98.4 F (36.9 C) 98.6 F (37 C)   TempSrc:  Oral Oral   Resp:  19 18   Height:      Weight:   233 lb 11 oz (106 kg)   SpO2: 94% 94%  92%    Intake/Output Summary (Last 24 hours) at 10/08/14 1422 Last data filed at 10/08/14 0900  Gross per 24 hour  Intake   1150 ml  Output   4400 ml  Net  -3250 ml    VASC  RLE erythema improved, drainage on dressing overlying R heel with greenish tinge, no frank pus or smell, clean ulcer  Laboratory CBC    Component Value Date/Time   WBC 8.9 10/08/2014 0407   HGB 10.6* 10/08/2014 0407   HCT 32.0* 10/08/2014 0407   PLT 159 10/08/2014 0407    BMET    Component Value Date/Time   NA 139 10/08/2014 0407   K 3.3* 10/08/2014 0407   CL 101 10/08/2014 0407   CO2 26 10/08/2014 0407   GLUCOSE 124* 10/08/2014 0407   BUN 19 10/08/2014 0407   CREATININE 1.50* 10/08/2014 0407   CALCIUM 8.3* 10/08/2014 0407   GFRNONAA 41* 10/08/2014 0407   GFRAA 48* 10/08/2014 0407    Adele Barthel, MD [facility]: [phone] Pager: [phone]  10/08/2014, 2:22 PM

## 2014-10-08 NOTE — Progress Notes (Signed)
Providing one on one care to patient. Afib with PVCs noted rate 80-90's. Pt condition stable.

## 2014-10-08 NOTE — Progress Notes (Signed)
CSW received consult for [facility] vs. SNF at discharge. CSW confirmed with RNCM, Cookie that patient plans to discharge home with home health services, not SNF.   No further CSW needs identified - CSW signing off.   Raynaldo Opitz, [facility] Clinical Social Worker cell #: [phone]

## 2014-10-08 NOTE — Progress Notes (Signed)
Subjective: Pt denies any pain in the right heel.  His wife is at the bedside.  They report to me that they've thought over the options and would like to try suppressive abx.  He is scheduled to get a PICC line tomorrow.  Dr. Lucianne Lei Dam's, Russo's and Chen's notes reviewed.   Objective: Vital signs in last 24 hours: Temp:  [97.8 F (36.6 C)-98.6 F (37 C)] 97.8 F (36.6 C) (10/21 1501) Pulse Rate:  [77-79] 77 (10/21 1501) Resp:  [18-19] 18 (10/21 1501) BP: (150-154)/(62-87) 154/63 mmHg (10/21 1501) SpO2:  [92 %-96 %] 96 % (10/21 1605) Weight:  [106 kg (233 lb 11 oz)] 106 kg (233 lb 11 oz) (10/21 0505)  Intake/Output from previous day: 10/20 0701 - 10/21 0700 In: 1030 [P.O.:600; I.V.:330; IV Piggyback:100] Out: 4826 [Urine:4825; Stool:1] Intake/Output this shift: Total I/O In: 830 [P.O.:480; I.V.:300; IV Piggyback:50] Out: 1600 [Urine:1600]   Recent Labs  10/06/14 0750 10/07/14 0350 10/08/14 0407  HGB 10.6* 10.3* 10.6*    Recent Labs  10/07/14 0350 10/08/14 0407  WBC 8.5 8.9  RBC 3.79* 3.95*  HCT 30.9* 32.0*  PLT 144* 159    Recent Labs  10/07/14 0350 10/08/14 0407  NA 137 139  K 3.4* 3.3*  CL 101 101  CO2 24 26  BUN 23 19  CREATININE 1.50* 1.50*  GLUCOSE 140* 124*  CALCIUM 8.1* 8.3*   No results found for this basename: LABPT, INR,  in the last 72 hours  PE:  wn wd elderly male in nad.  R heel wound dressed and dry.  Chronic lymphedema at B LE.  No palpable pulses at right foot.    Assessment/Plan: R plantar foot ulcer with calcaneal osteomyelitis - as described in yesterday's note, this is a complicated situation with no good solutions.  I agree with Dr. Tommy Medal that suppressive abx may not be successful and that the patient is risking limb loss and conceivably loss of life in the event of recurrent sepsis.  He and his wife understand the risks and benefits of the alternative treatment options and reiterate the option of suppressive antibiotics.  I'll see  him back in the office as needed.   Wylene Simmer 10/08/2014, 5:51 PM

## 2014-10-08 NOTE — Progress Notes (Signed)
Subjective:  No cardiac complaints, no CP, no SOB. Feels a little "flustered" laying in bed all of the time he states.   Objective:  Vital Signs in the last 24 hours: Temp:  [98 F (36.7 C)-98.6 F (37 C)] 98.6 F (37 C) (10/21 0505) Pulse Rate:  [78-105] 78 (10/21 0505) Resp:  [18-22] 18 (10/21 0505) BP: (147-151)/(62-89) 150/62 mmHg (10/21 0505) SpO2:  [92 %-94 %] 92 % (10/21 0759) Weight:  [233 lb 11 oz (106 kg)] 233 lb 11 oz (106 kg) (10/21 0505)  Intake/Output from previous day: 10/20 0701 - 10/21 0700 In: 1030 [P.O.:600; I.V.:330; IV Piggyback:100] Out: 4826 [Urine:4825; Stool:1]   Physical Exam: General: Well developed, well nourished, in no acute distress.  Head:  Normocephalic and atraumatic. Lungs: Clear to auscultation and percussion. Heart: Normal S1 and S2. Occasional ectopy with 2/6 S RUSB murmur, no rubs or gallops.  Abdomen: soft, non-tender, positive bowel sounds. Overweight Extremities: 1-2+ edema. Ulcer. Neurologic: Alert and oriented x 3.    Lab Results:  Recent Labs  10/07/14 0350 10/08/14 0407  WBC 8.5 8.9  HGB 10.3* 10.6*  PLT 144* 159    Recent Labs  10/07/14 0350 10/08/14 0407  NA 137 139  K 3.4* 3.3*  CL 101 101  CO2 24 26  GLUCOSE 140* 124*  BUN 23 19  CREATININE 1.50* 1.50*    Recent Labs  10/05/14 1619 10/06/14 1243  TROPONINI <0.30 <0.30   Hepatic Function Panel  Recent Labs  10/08/14 0407  PROT 5.8*  ALBUMIN 2.2*  AST 28  ALT 17  ALKPHOS 86  BILITOT 0.3   Imaging: Mri Right Foot Without Contrast  10/07/2014   CLINICAL DATA:  Osteomyelitis of the heel. Subsequent encounter. Ulceration on the plantar aspect of the heel. RIGHT lower extremity redness and swelling.  EXAM: MRI OF THE RIGHT FOOT WITHOUT CONTRAST  TECHNIQUE: Multiplanar, multisequence MR imaging of the ankle was performed. No intravenous contrast was administered.  COMPARISON:  None.  FINDINGS: Diffuse soft tissue edema is present in the distal  leg and ankle, which is nonspecific but may represent cellulitis in the appropriate clinical setting. 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  TENDONS  Peroneal: Intact.  Posteromedial: Intact.  Anterior: Intact.  Achilles: Intact.  Plantar Fascia: Normal.  LIGAMENTS  Lateral: Normal.  Medial: Normal.  CARTILAGE  Ankle Joint: Normal.  Subtalar Joints/Sinus Tarsi: Normal.  Bones: Ulcer is present over the plantar aspect of the calcaneus. There is phlegmon in the soft tissues extending up to the plantar surface of the calcaneus, with a small area of cortical osteolysis and marrow edema compatible with osteomyelitis. No abscess. The area of osteomyelitis is just proximal to the plantar fascia origin.  IMPRESSION: Tiny focus of osteomyelitis in the plantar aspect of the calcaneus deep to the ulcer. Diffuse edema compatible with cellulitis.   Electronically Signed   By: Dereck Ligas M.D.   On: 10/07/2014 08:42   US Aorta  10/06/2014   CLINICAL DATA:  Aortic ectasia.  History of hypertension.  EXAM: ULTRASOUND OF ABDOMINAL AORTA  TECHNIQUE: Ultrasound examination of the abdominal aorta was performed to evaluate for abdominal aortic aneurysm.  COMPARISON:  None.  FINDINGS: Limited visibility of the proximal aorta due to increased bowel gas and patient body habitus.  Abdominal Aorta  Some atherosclerotic calcifications are noted in the abdominal aorta.  Maximum AP  Diameter: 3.3 cm maximum anterior to posterior diameter in the proximal abdominal aorta.  Maximum TRV  Diameter: 2.9 cm in the mid abdominal aorta.  Right common iliac artery:   Measures a maximum diameter of 1.3 cm.  Left common iliac artery:   Measures a maximum diameter of 1.3 cm.  IMPRESSION: Maximum AP diameter of the abdominal aorta is 3.3 cm, in the proximal abdominal aorta. This is consistent with mild aneurysmal dilatation. Atherosclerotic calcification is noted in the abdominal aorta.  Common iliac arteries are within normal limits for  maximum diameter.   Electronically Signed   By: Curlene Dolphin M.D.   On: 10/06/2014 20:15     Telemetry: No further high rate episodes of MAT with dilt.  Personally viewed.   EKG:  MAT  Cardiac Studies:  EF 70%, moderate AS  Assessment/Plan:  Active Problems:   Chronic kidney disease, stage III (moderate)   Sepsis   Multifocal atrial tachycardia   Hyperlipidemia   Obesity   Moderate aortic stenosis  80 year old male with multiple comorbidities including severe peripheral vascular disease with nonhealing right heel ulcer admitted with sepsis with EKG consistent with multifocal atrial tachycardia currently controlled on diltiazem.  1) MAT/PAC's - Dr. Virgina Jock converted to oral dilt this am. 360 QD. Doing well. Discussed with Dr. Virgina Jock  2) PAD - ASA. Dr. Donnetta Hutching  3) Obesity - weight loss.   4) COPD - stable   Will sign off. Please call if any questions.   SKAINS, [place] 10/08/2014, 10:11 AM

## 2014-10-09 ENCOUNTER — Inpatient Hospital Stay (HOSPITAL_COMMUNITY): Payer: Medicare Other

## 2014-10-09 LAB — CBC WITH DIFFERENTIAL/PLATELET
BASOS PCT: 0 % (ref 0–1)
Basophils Absolute: 0 10*3/uL (ref 0.0–0.1)
EOS PCT: 4 % (ref 0–5)
Eosinophils Absolute: 0.4 10*3/uL (ref 0.0–0.7)
HEMATOCRIT: 33.1 % — AB (ref 39.0–52.0)
Hemoglobin: 10.8 g/dL — ABNORMAL LOW (ref 13.0–17.0)
Lymphocytes Relative: 14 % (ref 12–46)
Lymphs Abs: 1.4 10*3/uL (ref 0.7–4.0)
MCH: 26.5 pg (ref 26.0–34.0)
MCHC: 32.6 g/dL (ref 30.0–36.0)
MCV: 81.1 fL (ref 78.0–100.0)
MONO ABS: 1.2 10*3/uL — AB (ref 0.1–1.0)
Monocytes Relative: 11 % (ref 3–12)
Neutro Abs: 7.5 10*3/uL (ref 1.7–7.7)
Neutrophils Relative %: 71 % (ref 43–77)
Platelets: 187 10*3/uL (ref 150–400)
RBC: 4.08 MIL/uL — ABNORMAL LOW (ref 4.22–5.81)
RDW: 14.6 % (ref 11.5–15.5)
WBC: 10.5 10*3/uL (ref 4.0–10.5)

## 2014-10-09 LAB — COMPREHENSIVE METABOLIC PANEL
ALK PHOS: 93 U/L (ref 39–117)
ALT: 19 U/L (ref 0–53)
ANION GAP: 13 (ref 5–15)
AST: 26 U/L (ref 0–37)
Albumin: 2.3 g/dL — ABNORMAL LOW (ref 3.5–5.2)
BILIRUBIN TOTAL: 0.4 mg/dL (ref 0.3–1.2)
BUN: 18 mg/dL (ref 6–23)
CHLORIDE: 100 meq/L (ref 96–112)
CO2: 26 mEq/L (ref 19–32)
Calcium: 8.4 mg/dL (ref 8.4–10.5)
Creatinine, Ser: 1.45 mg/dL — ABNORMAL HIGH (ref 0.50–1.35)
GFR calc Af Amer: 50 mL/min — ABNORMAL LOW (ref >90)
GFR calc non Af Amer: 43 mL/min — ABNORMAL LOW (ref >90)
Glucose, Bld: 105 mg/dL — ABNORMAL HIGH (ref 70–99)
Potassium: 3.2 mEq/L — ABNORMAL LOW (ref 3.7–5.3)
SODIUM: 139 meq/L (ref 137–147)
Total Protein: 6 g/dL (ref 6.0–8.3)

## 2014-10-09 LAB — GLUCOSE, CAPILLARY
Glucose-Capillary: 180 mg/dL — ABNORMAL HIGH (ref 70–99)
Glucose-Capillary: 190 mg/dL — ABNORMAL HIGH (ref 70–99)

## 2014-10-09 MED ORDER — POTASSIUM CHLORIDE CRYS ER 20 MEQ PO TBCR: 20.0000 meq | EXTENDED_RELEASE_TABLET | Freq: Two times a day (BID) | ORAL | Status: DC

## 2014-10-09 MED ORDER — SACCHAROMYCES BOULARDII 250 MG PO CAPS: 250.0000 mg | ORAL_CAPSULE | Freq: Two times a day (BID) | ORAL | Status: DC

## 2014-10-09 MED ORDER — SERTRALINE HCL 100 MG PO TABS
100.0000 mg | ORAL_TABLET | Freq: Every day | ORAL | Status: DC
  Administered 2014-10-09: 100 mg via ORAL
  Filled 2014-10-09: qty 1

## 2014-10-09 MED ORDER — LINAGLIPTIN 5 MG PO TABS
5.0000 mg | ORAL_TABLET | Freq: Every day | ORAL | Status: AC
Start: 2014-10-09 — End: ?

## 2014-10-09 MED ORDER — DILTIAZEM HCL ER COATED BEADS 360 MG PO CP24: 360.0000 mg | ORAL_CAPSULE | Freq: Every day | ORAL | Status: DC

## 2014-10-09 MED ORDER — MIRTAZAPINE 30 MG PO TABS: 15.0000 mg | ORAL_TABLET | Freq: Every day | ORAL | Status: DC

## 2014-10-09 MED ORDER — QUETIAPINE FUMARATE ER 50 MG PO TB24
50.0000 mg | ORAL_TABLET | Freq: Every day | ORAL | Status: DC
  Filled 2014-10-09: qty 1

## 2014-10-09 MED ORDER — DEXTROSE 5 % IV SOLN: INTRAVENOUS | Status: DC

## 2014-10-09 MED ORDER — SODIUM CHLORIDE 0.9 % IJ SOLN: 10.0000 mL | INTRAMUSCULAR | Status: DC | PRN

## 2014-10-09 MED ORDER — QUETIAPINE FUMARATE 25 MG PO TABS: 25.0000 mg | ORAL_TABLET | Freq: Every day | ORAL | Status: DC

## 2014-10-09 NOTE — Progress Notes (Signed)
[facility]  Day #4  ceftriaxone   3 days of flagyl 3 days  vancomycin  Subjective: No new complaints   Antibiotics:  Anti-infectives   Start     Dose/Rate Route Frequency Ordered Stop   10/09/14 0000  cefTRIAXone in dextrose 5 % 50 mL     100 mL/hr over 30 Minutes   10/09/14 0749     10/07/14 0600  vancomycin (VANCOCIN) 1,500 mg in sodium chloride 0.9 % 500 mL IVPB  Status:  Discontinued     1,500 mg 250 mL/hr over 120 Minutes Intravenous Every 24 hours 10/06/14 1057 10/07/14 1110   10/06/14 1400  metroNIDAZOLE (FLAGYL) tablet 500 mg  Status:  Discontinued     500 mg Oral 3 times per day 10/06/14 0959 10/08/14 0945   10/06/14 1200  cefTRIAXone (ROCEPHIN) 2 g in dextrose 5 % 50 mL IVPB     2 g 100 mL/hr over 30 Minutes Intravenous Every 24 hours 10/06/14 0959     10/06/14 0500  vancomycin (VANCOCIN) IVPB 1000 mg/200 mL premix  Status:  Discontinued     1,000 mg 200 mL/hr over 60 Minutes Intravenous Every 12 hours 10/05/14 1700 10/06/14 1057   10/06/14 0000  piperacillin-tazobactam (ZOSYN) IVPB 3.375 g  Status:  Discontinued     3.375 g 12.5 mL/hr over 240 Minutes Intravenous Every 8 hours 10/05/14 1700 10/06/14 0954   10/05/14 1600  piperacillin-tazobactam (ZOSYN) IVPB 3.375 g     3.375 g 100 mL/hr over 30 Minutes Intravenous  Once 10/05/14 1556 10/05/14 1902   10/05/14 1600  vancomycin (VANCOCIN) IVPB 1000 mg/200 mL premix     1,000 mg 200 mL/hr over 60 Minutes Intravenous  Once 10/05/14 1556 10/05/14 1750      Medications: Scheduled Meds: . aspirin EC  81 mg Oral q morning - 10a  . atorvastatin  10 mg Oral QHS  . carbidopa-levodopa  1 tablet Oral QPM  . cefTRIAXone (ROCEPHIN)  IV  2 g Intravenous Q24H  . diltiazem  360 mg Oral Daily  . docusate sodium  100 mg Oral BID  . enoxaparin (LOVENOX) injection  0.5 mg/kg Subcutaneous Q24H  . famotidine  20 mg Oral BID  . feeding supplement (ENSURE)  1 Container Oral TID BM  . fentaNYL  25 mcg  Transdermal Q72H  . gabapentin  300 mg Oral BID  . insulin aspart  0-9 Units Subcutaneous TID WC  . ipratropium-albuterol  3 mL Nebulization QID  . lactose free nutrition  237 mL Oral Q24H  . levothyroxine  112 mcg Oral q morning - 10a  . linagliptin  5 mg Oral Daily  . magnesium oxide  400 mg Oral Daily  . mometasone-formoterol  2 puff Inhalation BID  . mupirocin ointment  1 application Nasal BID  . potassium chloride  20 mEq Oral BID  . QUEtiapine  50 mg Oral QHS  . saccharomyces boulardii  250 mg Oral BID  . sertraline  100 mg Oral Daily  . torsemide  10 mg Oral Daily   Continuous Infusions: . sodium chloride 30 mL/hr at 10/08/14 0509  . diltiazem (CARDIZEM) infusion Stopped (10/08/14 1400)   PRN Meds:.acetaminophen, albuterol, clonazePAM, morphine injection, ondansetron (ZOFRAN) IV, ondansetron, oxyCODONE    Objective: Weight change: -6.2 oz (-0.176 kg)  Intake/Output Summary (Last 24 hours) at 10/09/14 1510 Last data filed at 10/09/14 0600  Gross per 24 hour  Intake   1050 ml  Output   1600 ml  Net   -550 ml   Blood pressure 146/85, pulse 94, temperature 98.1 F (36.7 C), temperature source Oral, resp. rate 22, height 6' (1.829 m), weight 233 lb 4.8 oz (105.824 kg), SpO2 95.00%. Temp:  [98.1 F (36.7 C)] 98.1 F (36.7 C) (10/22 0525) Pulse Rate:  [88-94] 94 (10/22 0525) Resp:  [20-22] 22 (10/22 0525) BP: (146-169)/(85-93) 146/85 mmHg (10/22 0525) SpO2:  [92 %-96 %] 95 % (10/22 1129) Weight:  [233 lb 4.8 oz (105.824 kg)] 233 lb 4.8 oz (105.824 kg) (10/22 0500)  Physical Exam: General: Alert and awake, oriented x3, pale  HEENT: anicteric sclera, EOMI, oropharynx clear and without exudate  CVStachycardic, II/VI murmur at PMI  Chest: clear to auscultation bilaterally, no wheezing, rales or rhonchi  Abdomen: soft nontender, nondistended, normal bowel sounds,  Extremities/skin: edematous skin, DP not palpable  Right foot dressed  See pictures from yesterday none  taken today   Neuro: nonfocal  CBC:  Recent Labs Lab 10/05/14 1527 10/06/14 0750 10/07/14 0350 10/08/14 0407 10/09/14 0350  HGB 12.7* 10.6* 10.3* 10.6* 10.8*  HCT 39.8 32.5* 30.9* 32.0* 33.1*  PLT 158 126* 144* 159 187     BMET  Recent Labs  10/08/14 0407 10/09/14 0350  NA 139 139  K 3.3* 3.2*  CL 101 100  CO2 26 26  GLUCOSE 124* 105*  BUN 19 18  CREATININE 1.50* 1.45*  CALCIUM 8.3* 8.4     Liver Panel   Recent Labs  10/08/14 0407 10/09/14 0350  PROT 5.8* 6.0  ALBUMIN 2.2* 2.3*  AST 28 26  ALT 17 19  ALKPHOS 86 93  BILITOT 0.3 0.4       Sedimentation Rate  Recent Labs  10/07/14 0350  ESRSEDRATE 90*   C-Reactive Protein  Recent Labs  10/07/14 0350  CRP 19.2*    Micro Results: Recent Results (from the past 720 hour(s))  CULTURE, BLOOD (ROUTINE X 2)     Status: None   Collection Time    10/05/14  3:27 PM      Result Value Ref Range Status   Specimen Description BLOOD LEFT FOREARM   Final   Special Requests BOTTLES DRAWN AEROBIC ONLY 3CC   Final   Culture  Setup Time     Final   Value: 10/05/2014 18:50     Performed at [facility]   Culture     Final   Value: GROUP B STREP(S.AGALACTIAE)ISOLATED     Note: Gram Stain Report Called to,Read Back By and Verified With: JACLYN T. @ 10.25AM 10.19.15     Performed at [facility]   Report Status 10/08/2014 FINAL   Final   Organism ID, Bacteria GROUP B STREP(S.AGALACTIAE)ISOLATED   Final  CULTURE, BLOOD (ROUTINE X 2)     Status: None   Collection Time    10/05/14  3:28 PM      Result Value Ref Range Status   Specimen Description BLOOD LEFT ANTECUBITAL   Final   Special Requests BOTTLES DRAWN AEROBIC AND ANAEROBIC 3CC   Final   Culture  Setup Time     Final   Value: 10/05/2014 18:50     Performed at [facility]   Culture     Final   Value: GROUP B STREP(S.AGALACTIAE)ISOLATED     Note: SUSCEPTIBILITIES PERFORMED ON PREVIOUS CULTURE WITHIN THE LAST 5 DAYS.      Note: Gram Stain Report Called to,Read Back By and Verified With: Nancee Liter. RN on 10/06/14 at 06:25  by Rise Mu     Performed at [facility]   Report Status 10/08/2014 FINAL   Final  URINE CULTURE     Status: None   Collection Time    10/05/14  5:26 PM      Result Value Ref Range Status   Specimen Description URINE, RANDOM   Final   Special Requests NONE   Final   Culture  Setup Time     Final   Value: 10/06/2014 00:16     Performed at [place]     Final   Value: NO GROWTH     Performed at [facility]   Culture     Final   Value: NO GROWTH     Performed at [facility]   Report Status 10/07/2014 FINAL   Final  WOUND CULTURE     Status: None   Collection Time    10/05/14  5:56 PM      Result Value Ref Range Status   Specimen Description WOUND FOOT   Final   Special Requests NONE   Final   Gram Stain     Final   Value: NO WBC SEEN     NO SQUAMOUS EPITHELIAL CELLS SEEN     NO ORGANISMS SEEN     Performed at [facility]   Culture     Final   Value: MODERATE GROUP B STREP(S.AGALACTIAE)ISOLATED     Note: TESTING AGAINST S. AGALACTIAE NOT ROUTINELY PERFORMED DUE TO PREDICTABILITY OF AMP/PEN/VAN SUSCEPTIBILITY.     Performed at [facility]   Report Status 10/08/2014 FINAL   Final  MRSA PCR SCREENING     Status: None   Collection Time    10/05/14  8:48 PM      Result Value Ref Range Status   MRSA by PCR NEGATIVE  NEGATIVE Final   Comment:            The GeneXpert MRSA Assay (FDA     approved for NASAL specimens     only), is one component of a     comprehensive MRSA colonization     surveillance program. It is not     intended to diagnose MRSA     infection nor to guide or     monitor treatment for     MRSA infections.  URINE CULTURE     Status: None   Collection Time    10/05/14  9:09 PM      Result Value Ref Range Status   Specimen Description URINE, CATHETERIZED   Final   Special Requests NONE    Final   Culture  Setup Time     Final   Value: 10/06/2014 03:13     Performed at [place]     Final   Value: NO GROWTH     Performed at [facility]   Culture     Final   Value: NO GROWTH     Performed at [facility]   Report Status 10/07/2014 FINAL   Final  CULTURE, BLOOD (ROUTINE X 2)     Status: None   Collection Time    10/06/14 12:43 PM      Result Value Ref Range Status   Specimen Description BLOOD RIGHT WRIST   Final   Special Requests BOTTLES DRAWN AEROBIC ONLY 2 CC   Final   Culture  Setup Time  Final   Value: 10/06/2014 14:49     Performed at [facility]   Culture     Final   Value:        BLOOD CULTURE RECEIVED NO GROWTH TO DATE CULTURE WILL BE HELD FOR 5 DAYS BEFORE ISSUING A FINAL NEGATIVE REPORT     Performed at [facility]   Report Status PENDING   Incomplete  CULTURE, BLOOD (ROUTINE X 2)     Status: None   Collection Time    10/06/14  3:05 PM      Result Value Ref Range Status   Specimen Description BLOOD LEFT HAND   Final   Special Requests BOTTLES DRAWN AEROBIC AND ANAEROBIC  5 CC   Final   Culture  Setup Time     Final   Value: 10/06/2014 21:03     Performed at [facility]   Culture     Final   Value:        BLOOD CULTURE RECEIVED NO GROWTH TO DATE CULTURE WILL BE HELD FOR 5 DAYS BEFORE ISSUING A FINAL NEGATIVE REPORT     Performed at [facility]   Report Status PENDING   Incomplete    Studies/Results: No results found.    Assessment/Plan:  Active Problems:   Chronic kidney disease, stage III (moderate)   Sepsis   Multifocal atrial tachycardia   Hyperlipidemia   Obesity   Moderate aortic stenosis    Imir Brumbach [name] is a 80 y.o. male with Multiple medical problems including "prediabetes", extensive smoking PVD occluded superficial femoral with ulcer that has been not resolved over 6 month + period with recurrent episodes of purulent drainage now with septic  shock from gGroup B streptococcal bacteremia    #1 Purulent foot ulcer with Group B streptococcal bacteremia in pt with PVD, prediabetes now with CALCANEAL OSTEO on MRI  [place] it seems pointless to consider revascularization, as such bone infections do not typically resolve without aggressive curative amputation. I suspect for cure he would need a BKA. I am VERY skeptical that IF he underwent re-vascularization AND debridement by Orthopedic surgery, followed by protracted IV and then oral abx that we could CURE this  I can certainly appreciate that the patient has anxiety about vascular surgery and also about orthopedic surgery and he is correct to want to weigh benefits of surgery or no surgery in terms of risk of surgery itself and weighing risks and benefits of losing his foot.  I am confident WE WILL NOT CURE THIS INFECTION ABSENT BKA and I am skeptical I am going to be able to even suppress this though I am certainly open to trying this I DO NOT THink it will succeed, it could buy him SOME TIME  Greatly appreciate VVS, Orthopedics, Cardiology, WOC, Nutrition, CM consults   Patient had extensive discussion with Dr Virgina Jock and he would wish to attempt to suppress infection in his foot with 6 weeks of IV abx followed by pills.  --would ensure repeat cultures remain with NG and can place PICC   ---should receive 6 weeks of IV rocephin with day one being 10/05/14.  --I will switch him over to oral amoxicillin once he is through with his IV ceftriaxone   #2 Left sided foot ulcer: does not appear overtly infected, but have ordered  plain films since this has not resolved over nearly a year. I believe he is refusing this study. I can bring it up with him  in followup  I will sign off for now Please call with further questions I will arrange HSFU in my clinic for him  I will arrange HSFU in our clinic      LOS: 4 days   Alcide Evener 10/09/2014, 3:10 PM

## 2014-10-09 NOTE — Discharge Summary (Signed)
Physician Discharge Summary  DISCHARGE SUMMARY   Patient ID: [name] MR#: [id number] DOB/AGE: Apr 21, 1929 80 y.o.   Attending 64 M  Patient's CZY:SAYTK,ZSWF M, MD  Consults:  Vasc Surger Dr Chen/Dr Early. ID Dr Tommy Medal. Cards Dr Marlou Porch. Ortho Dr Doran Durand. CCM  Admit date: 10/05/2014 Discharge date: 10/09/2014  Discharge Diagnoses:  Active Problems:   Chronic kidney disease, stage III (moderate)   Sepsis   Multifocal atrial tachycardia   Hyperlipidemia   Obesity   Moderate aortic stenosis   Patient Active Problem List   Diagnosis Date Noted  . Hyperlipidemia 10/07/2014  . Obesity 10/07/2014  . Moderate aortic stenosis 10/07/2014  . Multifocal atrial tachycardia 10/06/2014  . Sepsis 10/05/2014  . Infected ulcer of skin 08/28/2014  . Foot ulcer 01/04/2013  . Cellulitis 01/04/2013  . PAD (peripheral artery disease) 01/04/2013  . Leg swelling 10/23/2012  . Atherosclerosis of native arteries of the extremities with ulceration(440.23) 10/23/2012  . Aspiration pneumonia 08/17/2012  . Toxic encephalopathy 08/17/2012  . COPD with acute exacerbation 08/17/2012  . Sacral decubitus ulcer 08/17/2012  . Chronic pain 08/17/2012  . Anxiety disorder 08/17/2012  . Chronic kidney disease, stage III (moderate) 08/17/2012  . HYPOTHYROIDISM 02/21/2008  . DYSLIPIDEMIA 02/21/2008  . ANXIETY DEPRESSION 02/21/2008  . RHEUMATIC FEVER 02/21/2008  . HYPERTENSION 02/21/2008  . COPD 02/21/2008  . ESOPHAGEAL MOTILITY DISORDER 02/21/2008  . ESOPHAGEAL DIVERTICULUM 02/21/2008  . SLEEP APNEA 02/21/2008  . ATHEROSCLEROSIS 07/11/2007  . INGUINAL HERNIAS, BILATERAL 07/11/2007  . HERNIA, UMBILICAL 09/32/3557  . CHOLELITHIASIS 07/11/2007  . UNSPECIFIED RENAL SCLEROSIS 07/11/2007  . TUBULOVILLOUS ADENOMA, COLON 11/02/2005  . GERD 11/02/2005  . DIVERTICULOSIS OF COLON 11/02/2005  . MELANOSIS COLI 11/02/2005   Past Medical History  Diagnosis Date  . Hyperlipidemia    . COPD (chronic obstructive pulmonary disease)   . Leg pain   . GERD (gastroesophageal reflux disease)   . Depression   . Dizziness   . Productive cough   . Thyroid disease   . Peripheral vascular disease   . ED (erectile dysfunction)   . Hypertension     08/17/12 - wife denies  . Walking pneumonia 2000's  . H/O hiatal hernia   . Migraine     "when I was a young boy"  . Arthritis     "hands" (08/28/2014)  . Anxiety   . Hypothyroidism   . Chronic kidney disease (CKD), stage III (moderate)     [name] 08/28/2014    Discharged Condition: Poor but better   Discharge Medications:   Medication List    STOP taking these medications       Melatonin 3 MG Caps      TAKE these medications       acetaminophen 325 MG tablet  Commonly known as:  TYLENOL  Take 650 mg by mouth every 6 (six) hours as needed. For pain or fever     albuterol (5 MG/ML) 0.5% nebulizer solution  Commonly known as:  PROVENTIL  Take 2.5 mg by nebulization every 6 (six) hours as needed. For shortness of breath     aspirin EC 81 MG tablet  Take 81 mg by mouth every morning.     carbidopa-levodopa 10-100 MG per tablet  Commonly known as:  SINEMET IR  Take 1 tablet by mouth every evening.     cefTRIAXone in dextrose 5 % 50 mL  Inject 2 g into the vein daily for 35 days.  Start 10/18 - finish 11/29  clonazePAM 0.5 MG tablet  Commonly known as:  KLONOPIN  Take 0.25-1 mg by mouth 2 (two) times daily as needed for anxiety. Take 1/2 of tablet (.25mg ) in the morning and take 2 (1) tablets at night.     diltiazem 360 MG 24 hr capsule  Commonly known as:  CARDIZEM CD  Take 1 capsule (360 mg total) by mouth daily.     feeding supplement (ENSURE) Pudg  Take 1 Container by mouth 3 (three) times daily between meals.     fentaNYL 50 MCG/HR  Commonly known as:  DURAGESIC - dosed mcg/hr  Place 50 mcg onto the skin every 3 (three) days.     Fluticasone-Salmeterol 250-50 MCG/DOSE Aepb  Commonly known as:   ADVAIR  Inhale 1-2 puffs into the lungs 2 (two) times daily as needed (for wheezing.).     gabapentin 300 MG capsule  Commonly known as:  NEURONTIN  Take 1 capsule (300 mg total) by mouth 2 (two) to 3 (three)  times daily.     HYDROcodone-acetaminophen 5-325 MG per tablet  Commonly known as:  NORCO/VICODIN  Take 1-2 tablets by mouth every 6 (six) hours as needed. For pain     ipratropium 0.02 % nebulizer solution  Commonly known as:  ATROVENT  Take 500 mcg by nebulization every 6 (six) hours as needed. For shortness of breath     levothyroxine 112 MCG tablet  Commonly known as:  SYNTHROID, LEVOTHROID  Take 112 mcg by mouth every morning.     linagliptin 5 MG Tabs tablet  Commonly known as:  TRADJENTA  Take 1 tablet (5 mg total) by mouth daily.     mirtazapine 30 MG tablet  Commonly known as:  REMERON  Take 0.5 tablets (15 mg total) by mouth at bedtime.     mupirocin ointment 2 %  Commonly known as:  BACTROBAN  Place 1 application into the nose 2 (two) times daily.     potassium chloride SA 20 MEQ tablet  Commonly known as:  K-DUR,KLOR-CON  Take 1 tablet (20 mEq total) by mouth 2 (two) times daily.     QUEtiapine 25 MG tablet  Commonly known as:  SEROQUEL  Take 1 tablet (25 mg total) by mouth at bedtime. May take 1-2 during the day as needed.     saccharomyces boulardii 250 MG capsule  Commonly known as:  FLORASTOR  Take 1 capsule (250 mg total) by mouth 2 (two) times daily.     sertraline 100 MG tablet  Commonly known as:  ZOLOFT  Take 100 mg by mouth every evening.     simvastatin 40 MG tablet  Commonly known as:  ZOCOR  Take 40 mg by mouth at bedtime.     torsemide 20 MG tablet  Commonly known as:  DEMADEX  Take 0.5 tablets (10 mg total) by mouth daily as needed for edema. Unfortunately he dislikes Diuretics as it makes his L Kidney hurt        Hospital Procedures: Mri Right Foot Without Contrast  10/07/2014   CLINICAL DATA:  Osteomyelitis of the heel.  Subsequent encounter. Ulceration on the plantar aspect of the heel. RIGHT lower extremity redness and swelling.  EXAM: MRI OF THE RIGHT FOOT WITHOUT CONTRAST  TECHNIQUE: Multiplanar, multisequence MR imaging of the ankle was performed. No intravenous contrast was administered.  COMPARISON:  None.  FINDINGS: Diffuse soft tissue edema is present in the distal leg and ankle, which is nonspecific but may represent cellulitis in the appropriate  clinical setting. 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  TENDONS  Peroneal: Intact.  Posteromedial: Intact.  Anterior: Intact.  Achilles: Intact.  Plantar Fascia: Normal.  LIGAMENTS  Lateral: Normal.  Medial: Normal.  CARTILAGE  Ankle Joint: Normal.  Subtalar Joints/Sinus Tarsi: Normal.  Bones: Ulcer is present over the plantar aspect of the calcaneus. There is phlegmon in the soft tissues extending up to the plantar surface of the calcaneus, with a small area of cortical osteolysis and marrow edema compatible with osteomyelitis. No abscess. The area of osteomyelitis is just proximal to the plantar fascia origin.  IMPRESSION: Tiny focus of osteomyelitis in the plantar aspect of the calcaneus deep to the ulcer. Diffuse edema compatible with cellulitis.   Electronically Signed   By: Dereck Ligas M.D.   On: 10/07/2014 08:42   US Aorta  10/06/2014   CLINICAL DATA:  Aortic ectasia.  History of hypertension.  EXAM: ULTRASOUND OF ABDOMINAL AORTA  TECHNIQUE: Ultrasound examination of the abdominal aorta was performed to evaluate for abdominal aortic aneurysm.  COMPARISON:  None.  FINDINGS: Limited visibility of the proximal aorta due to increased bowel gas and patient body habitus.  Abdominal Aorta  Some atherosclerotic calcifications are noted in the abdominal aorta.  Maximum AP  Diameter: 3.3 cm maximum anterior to posterior diameter in the proximal abdominal aorta.  Maximum TRV  Diameter: 2.9 cm in the mid abdominal aorta.  Right common iliac artery:   Measures a  maximum diameter of 1.3 cm.  Left common iliac artery:   Measures a maximum diameter of 1.3 cm.  IMPRESSION: Maximum AP diameter of the abdominal aorta is 3.3 cm, in the proximal abdominal aorta. This is consistent with mild aneurysmal dilatation. Atherosclerotic calcification is noted in the abdominal aorta.  Common iliac arteries are within normal limits for maximum diameter.   Electronically Signed   By: Curlene Dolphin M.D.   On: 10/06/2014 20:15   Dg Chest Port 1 View  10/05/2014   CLINICAL DATA:  Shortness of breath, cellulitis, COPD  EXAM: PORTABLE CHEST - 1 VIEW  COMPARISON:  08/28/2014  FINDINGS: There is bilateral mild increased interstitial thickening. There are trace bilateral pleural effusions. There is no pneumothorax. There is stable cardiomegaly. The osseous structures are unremarkable.  IMPRESSION: Overall findings concerning for mild CHF.   Electronically Signed   By: Kathreen Devoid   On: 10/05/2014 17:22   Dg Foot Complete Right  10/05/2014   CLINICAL DATA:  Ulceration on the plantar surface of the right heel.  EXAM: RIGHT FOOT COMPLETE - 3+ VIEW  COMPARISON:  Plain films 08/28/2014 and MRI 09/01/2014.  FINDINGS: Soft tissues of the foot are diffusely and severely swollen. Skin ulceration is seen over the plantar surface of the calcaneus. No underlying soft tissue gas collection, bony destructive change, radiopaque foreign body or periostitis is identified. No fracture or dislocation.  IMPRESSION: Skin ulceration on the heel without plain film evidence of osteomyelitis.  Severe soft tissue swelling diffusely about the foot.   Electronically Signed   By: Inge Rise M.D.   On: 10/05/2014 17:54    History of Present Illness:  Rush Landmark is an 80 yo gentleman with a complicated medical history including a known chronic right heel ulcer for several months that has been giving him on and off problems. He has known sig PAD. He reportedly just finished some outpatient antibiotics and had inpt  admission 08/2014. He presented to the ER  10/05/14 with fever and lethargy and had been  feeling worse for the last 5-6 days. He denied pain. He reports fatigue.   Hospital Course:  Admitted 10/05/14 c Sepsis syndrome from Heel Ulcer.  He became obtunded quickly and needed CCM consult.  With removal of fentanyl patch and Hydration he improved overnight and never needed intubation or CPAP/BiPAP. He quickly grew +ve blood cultures, Anaerobic bottle, gram +ve cocci in chains - He was placed on Vanco and Zosyn in ED.  He had last seen Dr Donnetta Hutching 9/22 and they discussed right femoral to above-knee popliteal bypass if he had progression of tissue loss. He had LE Arteriogram 9/14 - see report  He was awake and alert on 10/19 am Work up was coordinated with consults to ID/Vasc surgery and Ortho Ultimately he had Osteomyelitis from Heel Ulcer.  After numerous discussions regarding his options he elected conservative measures and not to amputate. He is s/p PICC line and will get 6 weeks IV Rocephin followed by indefinate oral Amoxil  He is also now on oral Cardizem for MAT - not AFib. No increased anticoagulation needs appreciated.   Sepsis from R Heel Wound/Osteomyelitis/Group B streptococcal bacteremia - Fevers resolved, Leukoctosis resolved, Lactic Acidosis - better, Blood pressures now fine, Hypoxia/hypoventilation/obtundation already better. Blood cultures + for GROUP B STREP(S.AGALACTIAE)ISOLATED - S/P Vanco/Rocephi/Flagyl from ID and will be Rxed c 2gm IV Rocephin for 42 days followed by oral Amoxil. MRI Showed: Tiny focus of osteomyelitis in the plantar aspect of the calcaneus deep to the ulcer. Diffuse edema compatible with cellulitis. Dr Hewitt's note stated Definitive treatment of this calcaneus infection would require at a minimum partial calcanectomy with prolonged use of a wound VAC to close the resultant plantar foot wound. This has a poor likelihood of healing given the compromised vascularity  and would keep him off the foot for a prolonged period of time. As an alternative a BK amputation could be considered but would also likely have difficulty healing and may ultimately require AKA. Either choice would severely limit his function since he's unlikely to ever ambulate with a prosthesis. A final alternative would be suppression with IV and then oral abx for the foreseeable future. This is probably the option that will allow him to be the most mobile. Dr Doran Durand stated 10/21 - this is a complicated situation with no good solutions. I agree with Dr. Tommy Medal that suppressive abx may not be successful and that the patient is risking limb loss and conceivably loss of life in the event of recurrent sepsis. He and his wife understand the risks and benefits of the alternative treatment options and reiterate the option of suppressive antibiotics. I'll see him back in the office as needed.   On10/23 PICC placed and plan is for 6wks IV Abx followed by oral Abx. He understands this may not work well and he may develop more Sepsis/Cellulitis or gangrene requiring a future amputation. He realizes that he may die b/c of his med issues. If this is the case he asked me to make sure he is comfortable.  Repeat Bl Cx x 2 Neg to Date from 10/19  He will follow up c me 1-2 weeks and Dr Donnetta Hutching 10/28/14  He will go home c [place] [facility] [place] for IV Abx The pt cancelled L Foot X ray  Dr Tommy Medal will also see him back in F/up   Hypersomnolence and lethargy - better post hydration, IVF and fentanyl off - has now restarted. On prn Narcs for pain   Known  PAD. He had LE Arteriogram 9/14 = complete occlusion of his right superficial femoral artery at the origin with reconstitution of the above-knee popliteal. He has open tibial runoff initially but has extensive small vessel disease into his foot. MRI 9/14 showed no definitive evidence of Osteo involvement of his calcaneus. Sed rate on 9/15 was 65. Last saw Dr Donnetta Hutching  9/22 and they discussed right femoral to above-knee popliteal bypass if he has progression of tissue loss. Seen By Vasc surgery yesterday and discussed options. Repeat ABIs ABI / TBI R 0.65 / 0.26 and L 0.86 / 0.33.  Await current MRI. Discussion @ Revasc vrs amputation vrs more conservative options being had and he elects Abx and conservative Rx.   Min AAA   MAT c PACs/PVCs - Cardizem gtt transitioned to oral Cardizem CD 360. Holding hep gtt - not needed. Appreciate Cards help - they signed off. Had ECHO 08/2014 done c murmur - 08/2014 EF 65-70%. Mild LVH. Mod AS. Grade 1 Diastolic Dysfunction. Pulm HTN c PAP pressures 86mmHg.   Sacral Decub - Barrier cream to buttocks to protect and repel moisture.  Float heel to reduce pressure.   Volume overload c LE Edema in pt c Nml EF but known DD - Elevated Pro-BNP c nml Trop I - Diuretics  Low Albumin - Protein Malnutrition. Supplement  Mild Anemia - Hbg 10.3- 12.7  Mild Thrombocytopenia - 126-159  Hypokalemia - Replete.  Low Mag - Replete  DVT Proph ordered  CBGs 137-212. A1C 6.2. CBGs BID. Started Tradjenta.  CKD3 - Cr 1.50 - 1.83  COPD - On Rxs. Pulm Toilet.  HTN - BP fine to a little high  RLS - Sinemet is home med  Obesity-encourage weight loss  Depression - Restart Sertraline and Seroquel.  Moderate aortic stenosis-should not be of clinical consequence. Continue to monitor clinically.  Improve pain control.  D/c by Friday if possible.  HH and IV Abx.  Agree that he has a high Morbidity and Mortality regardless of what we do.   After PICC he asked to go home. We were able to get the rest of his inpt needs taken care of and organize his outpt      Day of Discharge Exam BP 146/85  Pulse 94  Temp(Src) 98.1 F (36.7 C) (Oral)  Resp 22  Ht 6' (1.829 m)  Wt 105.824 kg (233 lb 4.8 oz)  BMI 31.63 kg/m2  SpO2 95%  Physical Exam:  See am note   Discharge Labs:  Recent Labs  10/07/14 0350 10/08/14 0407 10/09/14 0350  NA 137  139 139  K 3.4* 3.3* 3.2*  CL 101 101 100  CO2 24 26 26   GLUCOSE 140* 124* 105*  BUN 23 19 18   CREATININE 1.50* 1.50* 1.45*  CALCIUM 8.1* 8.3* 8.4  MG 1.4*  --   --   PHOS 2.3  --   --     Recent Labs  10/08/14 0407 10/09/14 0350  AST 28 26  ALT 17 19  ALKPHOS 86 93  BILITOT 0.3 0.4  PROT 5.8* 6.0  ALBUMIN 2.2* 2.3*    Recent Labs  10/08/14 0407 10/09/14 0350  WBC 8.9 10.5  NEUTROABS 6.7 7.5  HGB 10.6* 10.8*  HCT 32.0* 33.1*  MCV 81.0 81.1  PLT 159 187   No results found for this basename: CKTOTAL, CKMB, CKMBINDEX, TROPONINI,  in the last 72 hours No results found for this basename: TSH, T4TOTAL, FREET3, T3FREE, THYROIDAB,  in the last 72  hours No results found for this basename: VITAMINB12, FOLATE, FERRITIN, TIBC, IRON, RETICCTPCT,  in the last 72 hours Lab Results  Component Value Date   INR 1.04 09/01/2014   INR 1.18 08/28/2014   INR 1.07 01/03/2013       Discharge instructions:  01-Home or Self Care     Follow-up Information   Call Wylene Simmer, MD. (As needed)    Specialty:  Orthopedic Surgery   Contact information:   [address] [place] [address] [phone]       Follow up with [name], MD In 1 week.   Specialty:  Internal Medicine   Contact information:   [place] [address] [phone]       Follow up with Alcide Evener, MD In 1 month.   Specialty:  Infectious Diseases   Contact information:   [address] [place] [address] [phone]        Disposition: Home c HH PT and RN  Follow-up Appts: Follow-up with Dr. Virgina Jock at [facility] in 1-2 wks.  Call for appointment.  Condition on Discharge: better but expected poor outcome  Tests Needing Follow-up: Labs and needs to discuss DNR  Time spent in discharge (includes decision making & examination of pt): 45 min  Signed: Codi Kertz M 10/09/2014, 3:58 PM

## 2014-10-09 NOTE — Progress Notes (Signed)
System down, pt identified by DOB and name and MR number

## 2014-10-09 NOTE — Discharge Instructions (Signed)
Bone and Joint Infections °Joint infections are called septic or infectious arthritis. An infected joint may damage cartilage and tissue very quickly. This may destroy the joint. Bone infections (osteomyelitis) may last for years. Joints may become stiff if left untreated. Bacteria are the most common cause. Other causes include viruses and fungi, but these are more rare. Bone and joint infections usually come from injury or infection elsewhere in your body; the germs are carried to your bones or joints through the bloodstream.  °CAUSES  °· Blood-carried germs from an infection elsewhere in your body can eventually spread to a bone or joint. The germ staphylococcus is the most common cause of both osteomyelitis and septic arthritis. °· An injury can introduce germs into your bones or joints. °SYMPTOMS  °· Weight loss. °· Tiredness. °· Chills and fever. °· Bone or joint pain at rest and with activity. °· Tenderness when touching the area or bending the joint. °· Refusal to bear weight on a leg or inability to use an arm due to pain. °· Decreased range of motion in a joint. °· Skin redness, warmth, and tenderness. °· Open skin sores and drainage. °RISK FACTORS °Children, the elderly, and those with weak immune systems are at increased risk of bone and joint infections. It is more common in people with HIV infections and with people on chemotherapy. People are also at increased risk if they have surgery where metal implants are used to stabilize the bone. Plates, screws, or artificial joints provide a surface that bacteria can stick on. Such a growth of bacteria is called biofilm. The biofilm protects bacteria from antibiotics and bodily defenses. This allows germs to multiply. Other reasons for increased risks include:  °· Having previous surgery or injury of a bone or joint. °· Being on high-dose corticosteroids and immunosuppressive medications that weaken your body's resistance to germs. °· Diabetes and  long-standing diseases. °· Use of intravenous street drugs. °· Being on hemodialysis. °· Having a history of urinary tract infections. °· Removal of your spleen (splenectomy). This weakens your immunity. °· Chronic viral infections such as HIV or AIDS. °· Lack of sensation such as paraplegia, quadriplegia, or spina bifida. °DIAGNOSIS  °· Increased numbers of white blood cells in your blood may indicate infection. Some times your caregivers are able to identify the infecting germs by testing your blood. Inflammatory markers present in your bloodstream such as an erythrocyte sedimentation rate (ESR or sed rate) or c-reactive protein (CRP) can be indicators of deep infection. °· Bone scans and X-ray exams are necessary for diagnosing osteomyelitis. They may help your caregiver find the infected areas. Other studies may give more detailed information. They may help detect fluid collections around a joint, abnormal bone surfaces, or be useful in diagnosing septic arthritis. They can find soft tissue swelling and find excess fluid in an infected joint or the adjacent bone. These tests include: °¨ Ultrasound. °¨ CT (computerized tomography). °¨ MRI (magnetic resonance imaging). °· The best test for diagnosing a bone or joint infection is an aspiration or biopsy. Your caregiver will usually use a local anesthetic. He or she can then remove tissue from a bone injury or use a needle to take fluids from an infected joint. A local anesthetic medication numbs the area to be biopsied. Often biopsies are done in the operating room under general anesthesia. This means you will be asleep during the procedure. Tests performed on these samples can identify an infection. °TREATMENT  °· Treatment can help control long-standing   infections, but infections may come back. °· Infections can infect any bone or joint at any age. °· Bone and joint infections are rarely fatal. °· Bone infection left untreated can become a never-ending infection.  It can spread to other areas of your body. It may eventually cause bone death. Reduced limb or joint function can result. In severe cases, this may require removal of a limb. Spinal osteomyelitis is very dangerous. Untreated, it may damage spinal nerves and cause death. °· The most common complication of septic arthritis is osteoarthritis with pain and decreased range of motion of the joint. °Some forms of treatment may include: °· If the infection is caused by bacteria, it is generally treated with antibiotics. You will likely receive the drugs through a vein (intravenously) for anywhere from 2 to 6 weeks. In some cases, especially with children, oral antibiotics following an initial intravenous dose may be effective. The treatment you receive depends on the: °¨ Type of bacteria. °¨ Location of the infection. °¨ Type of surgery that might be done. °¨ Other health conditions or issues you might have. °· Your caregiver may drain soft tissue abscesses or pockets of fluid around infected bones or joints. If you have septic arthritis, your caregiver may use a needle to drain pus from the joint on a daily basis. He or she may use an arthroscope to clean the joint or may need to open the joint surgically to remove damaged tissue and infection. An arthroscope is an instrument like a thin lighted telescope. It can be used to look inside the joint. °· Surgery is usually needed if the infection has become long-standing. It may also be needed if there is hardware (such as metal plates, screws, or artificial joints) inside the patient. Sometimes a bone or muscle graft is needed to fill in the open space. This promotes growth of new tissues and better blood flow to the area. °PREVENTION  °· Clean and disinfect wounds quickly to help prevent the start of a bone or joint infection. Get treatment for any infections to prevent spread to a bone or joint. °· Do not smoke. Smoking decreases healing rates of bone and predisposes to  infection. °· When given medications that suppress your immune system, use them according to your caregiver's instructions. Do not take more than prescribed for your condition. °· Take good care of your feet and skin, especially if you have diabetes, decreased sensation or circulation problems. °SEEK IMMEDIATE MEDICAL CARE IF:  °· You cannot bear weight on a leg or use an arm, especially following a minor injury. This can be a sign of bone or joint infection. °· You think you may have signs or symptoms of a bone or joint infection. Your chance of getting rid of an infection is better if treated early. °Document Released: 12/05/2005 Document Revised: 02/27/2012 Document Reviewed: 11/04/2009 °[employer]® Patient Information ©2015 [employer]. This information is not intended to replace advice given to you by your health care provider. Make sure you discuss any questions you have with your health care provider. ° °

## 2014-10-09 NOTE — Progress Notes (Signed)
Peripherally Inserted Central Catheter/Midline Placement  The IV Nurse has discussed with the patient and/or persons authorized to consent for the patient, the purpose of this procedure and the potential benefits and risks involved with this procedure.  The benefits include less needle sticks, lab draws from the catheter and patient may be discharged home with the catheter.  Risks include, but not limited to, infection, bleeding, blood clot (thrombus formation), and puncture of an artery; nerve damage and irregular heat beat.  Alternatives to this procedure were also discussed.  PICC/Midline Placement Documentation        [name] 10/09/2014, 3:20 PM

## 2014-10-09 NOTE — Progress Notes (Signed)
Subjective: Admitted c Sepsis/Osteomyelitis from Heel Ulcer. Now on oral Cardizem for MAT - not AFib Foot hurts if touched. Will get PICC line today and D/c tomorrow vrs late today/ He twisted L ankle and has some pains this am   Objective: Vital signs in last 24 hours: Temp:  [97.8 F (36.6 C)-98.1 F (36.7 C)] 98.1 F (36.7 C) (10/22 0525) Pulse Rate:  [77-94] 94 (10/22 0525) Resp:  [18-22] 22 (10/22 0525) BP: (146-169)/(63-93) 146/85 mmHg (10/22 0525) SpO2:  [92 %-96 %] 95 % (10/22 0525) Weight:  [105.824 kg (233 lb 4.8 oz)] 105.824 kg (233 lb 4.8 oz) (10/22 0500) Weight change: -0.176 kg (-6.2 oz) Last BM Date: 10/08/14  CBG (last 3)   Recent Labs  10/08/14 1140 10/08/14 1649 10/08/14 2233  GLUCAP 173* 127* 212*    Intake/Output from previous day:  Intake/Output Summary (Last 24 hours) at 10/09/14 0735 Last data filed at 10/09/14 0600  Gross per 24 hour  Intake   1880 ml  Output   3200 ml  Net  -1320 ml   10/21 0701 - 10/22 0700 In: 1880 [P.O.:1080; I.V.:750; IV Piggyback:50] Out: 5093 [Urine:3200]   Physical Exam  General appearance: Alert and O Eyes: no scleral icterus Throat: oropharynx moist without erythema Resp: distant, mostly clear Cardio: irreg/Irreg plus PVCs.  Murmur GI: soft, non-tender; bowel sounds normal; no masses,  no organomegaly Extremities: R heel c some cellulitis.  + Edema.  Ulcer   Lab Results:  Recent Labs  10/07/14 0350 10/08/14 0407 10/09/14 0350  NA 137 139 139  K 3.4* 3.3* 3.2*  CL 101 101 100  CO2 24 26 26   GLUCOSE 140* 124* 105*  BUN 23 19 18   CREATININE 1.50* 1.50* 1.45*  CALCIUM 8.1* 8.3* 8.4  MG 1.4*  --   --   PHOS 2.3  --   --      Recent Labs  10/08/14 0407 10/09/14 0350  AST 28 26  ALT 17 19  ALKPHOS 86 93  BILITOT 0.3 0.4  PROT 5.8* 6.0  ALBUMIN 2.2* 2.3*     Recent Labs  10/08/14 0407 10/09/14 0350  WBC 8.9 10.5  NEUTROABS 6.7 7.5  HGB 10.6* 10.8*  HCT 32.0* 33.1*  MCV 81.0  81.1  PLT 159 187    Lab Results  Component Value Date   INR 1.04 09/01/2014   INR 1.18 08/28/2014   INR 1.07 01/03/2013     Recent Labs  10/06/14 1243  TROPONINI <0.30     Recent Labs  10/06/14 0750  TSH 0.433    No results found for this basename: VITAMINB12, FOLATE, FERRITIN, TIBC, IRON, RETICCTPCT,  in the last 72 hours  Micro Results: Recent Results (from the past 240 hour(s))  CULTURE, BLOOD (ROUTINE X 2)     Status: None   Collection Time    10/05/14  3:27 PM      Result Value Ref Range Status   Specimen Description BLOOD LEFT FOREARM   Final   Special Requests BOTTLES DRAWN AEROBIC ONLY 3CC   Final   Culture  Setup Time     Final   Value: 10/05/2014 18:50     Performed at [facility]   Culture     Final   Value: GROUP B STREP(S.AGALACTIAE)ISOLATED     Note: Gram Stain Report Called to,Read Back By and Verified With: JACLYN T. @ 10.25AM 10.19.15     Performed at [facility]   Report Status 10/08/2014  FINAL   Final   Organism ID, Bacteria GROUP B STREP(S.AGALACTIAE)ISOLATED   Final  CULTURE, BLOOD (ROUTINE X 2)     Status: None   Collection Time    10/05/14  3:28 PM      Result Value Ref Range Status   Specimen Description BLOOD LEFT ANTECUBITAL   Final   Special Requests BOTTLES DRAWN AEROBIC AND ANAEROBIC 3CC   Final   Culture  Setup Time     Final   Value: 10/05/2014 18:50     Performed at [facility]   Culture     Final   Value: GROUP B STREP(S.AGALACTIAE)ISOLATED     Note: SUSCEPTIBILITIES PERFORMED ON PREVIOUS CULTURE WITHIN THE LAST 5 DAYS.     Note: Gram Stain Report Called to,Read Back By and Verified With: Nancee Liter RN on 10/06/14 at 06:25 by Rise Mu     Performed at [facility]   Report Status 10/08/2014 FINAL   Final  URINE CULTURE     Status: None   Collection Time    10/05/14  5:26 PM      Result Value Ref Range Status   Specimen Description URINE, RANDOM   Final   Special Requests NONE   Final    Culture  Setup Time     Final   Value: 10/06/2014 00:16     Performed at [place]     Final   Value: NO GROWTH     Performed at [facility]   Culture     Final   Value: NO GROWTH     Performed at [facility]   Report Status 10/07/2014 FINAL   Final  WOUND CULTURE     Status: None   Collection Time    10/05/14  5:56 PM      Result Value Ref Range Status   Specimen Description WOUND FOOT   Final   Special Requests NONE   Final   Gram Stain     Final   Value: NO WBC SEEN     NO SQUAMOUS EPITHELIAL CELLS SEEN     NO ORGANISMS SEEN     Performed at [facility]   Culture     Final   Value: MODERATE GROUP B STREP(S.AGALACTIAE)ISOLATED     Note: TESTING AGAINST S. AGALACTIAE NOT ROUTINELY PERFORMED DUE TO PREDICTABILITY OF AMP/PEN/VAN SUSCEPTIBILITY.     Performed at [facility]   Report Status 10/08/2014 FINAL   Final  MRSA PCR SCREENING     Status: None   Collection Time    10/05/14  8:48 PM      Result Value Ref Range Status   MRSA by PCR NEGATIVE  NEGATIVE Final   Comment:            The GeneXpert MRSA Assay (FDA     approved for NASAL specimens     only), is one component of a     comprehensive MRSA colonization     surveillance program. It is not     intended to diagnose MRSA     infection nor to guide or     monitor treatment for     MRSA infections.  URINE CULTURE     Status: None   Collection Time    10/05/14  9:09 PM      Result Value Ref Range Status   Specimen Description URINE, CATHETERIZED   Final   Special Requests NONE  Final   Culture  Setup Time     Final   Value: 10/06/2014 03:13     Performed at [facility]     Final   Value: NO GROWTH     Performed at [facility]   Culture     Final   Value: NO GROWTH     Performed at [facility]   Report Status 10/07/2014 FINAL   Final  CULTURE, BLOOD (ROUTINE X 2)     Status: None   Collection Time     10/06/14 12:43 PM      Result Value Ref Range Status   Specimen Description BLOOD RIGHT WRIST   Final   Special Requests BOTTLES DRAWN AEROBIC ONLY 2 CC   Final   Culture  Setup Time     Final   Value: 10/06/2014 14:49     Performed at [facility]   Culture     Final   Value:        BLOOD CULTURE RECEIVED NO GROWTH TO DATE CULTURE WILL BE HELD FOR 5 DAYS BEFORE ISSUING A FINAL NEGATIVE REPORT     Performed at [facility]   Report Status PENDING   Incomplete  CULTURE, BLOOD (ROUTINE X 2)     Status: None   Collection Time    10/06/14  3:05 PM      Result Value Ref Range Status   Specimen Description BLOOD LEFT HAND   Final   Special Requests BOTTLES DRAWN AEROBIC AND ANAEROBIC  5 CC   Final   Culture  Setup Time     Final   Value: 10/06/2014 21:03     Performed at [facility]   Culture     Final   Value:        BLOOD CULTURE RECEIVED NO GROWTH TO DATE CULTURE WILL BE HELD FOR 5 DAYS BEFORE ISSUING A FINAL NEGATIVE REPORT     Performed at [facility]   Report Status PENDING   Incomplete     Studies/Results: No results found.   Medications: Scheduled: . aspirin EC  81 mg Oral q morning - 10a  . atorvastatin  10 mg Oral QHS  . carbidopa-levodopa  1 tablet Oral QPM  . cefTRIAXone (ROCEPHIN)  IV  2 g Intravenous Q24H  . diltiazem  360 mg Oral Daily  . docusate sodium  100 mg Oral BID  . enoxaparin (LOVENOX) injection  0.5 mg/kg Subcutaneous Q24H  . famotidine  20 mg Oral BID  . feeding supplement (ENSURE)  1 Container Oral TID BM  . fentaNYL  25 mcg Transdermal Q72H  . gabapentin  300 mg Oral BID  . insulin aspart  0-9 Units Subcutaneous TID WC  . ipratropium-albuterol  3 mL Nebulization QID  . lactose free nutrition  237 mL Oral Q24H  . levothyroxine  112 mcg Oral q morning - 10a  . linagliptin  5 mg Oral Daily  . magnesium oxide  400 mg Oral Daily  . mometasone-formoterol  2 puff Inhalation BID  . mupirocin ointment  1 application  Nasal BID  . potassium chloride  20 mEq Oral BID  . saccharomyces boulardii  250 mg Oral BID  . torsemide  10 mg Oral Daily   Continuous: . sodium chloride 30 mL/hr at 10/08/14 0509  . diltiazem (CARDIZEM) infusion 15 mg/hr (10/08/14 0734)     Assessment/Plan: Active Problems:   Chronic kidney disease, stage III (  moderate)   Sepsis   Multifocal atrial tachycardia   Hyperlipidemia   Obesity   Moderate aortic stenosis  Sepsis from R Heel Wound/Osteomyelitis/Group B streptococcal bacteremia - Fevers resolved, Leukoctosis resolved, Lactic Acidosis - better, Blood pressures now fine, Hypoxia/hypoventilation/obtundation already better.    Blood cultures + for GROUP B STREP(S.AGALACTIAE)ISOLATED - S/P Vanco/Rocephi/Flagyl from ID and will be Rxed c 2gm IV Rocephin for 42 days followed by oral Amoxil. MRI Showed: Tiny focus of osteomyelitis in the plantar aspect of the calcaneus deep to the ulcer. Diffuse edema compatible with cellulitis. Dr Hewitt's note stated Definitive treatment of this calcaneus infection would require at a minimum partial calcanectomy with prolonged use of a wound VAC to close the resultant plantar foot wound. This has a poor likelihood of healing given the compromised vascularity and would keep him off the foot for a prolonged period of time. As an alternative a BK amputation could be considered but would also likely have difficulty healing and may ultimately require AKA. Either choice would severely limit his function since he's unlikely to ever ambulate with a prosthesis. A final alternative would be suppression with IV and then oral abx for the foreseeable future. This is probably the option that will allow him to be the most mobile.  Dr Doran Durand stated 10/21 - this is a complicated situation with no good solutions. I agree with Dr. Tommy Medal that suppressive abx may not be successful and that the patient is risking limb loss and conceivably loss of life in the event of recurrent  sepsis. He and his wife understand the risks and benefits of the alternative treatment options and reiterate the option of suppressive antibiotics. I'll see him back in the office as needed.  Working on D/c 10/23 c PICC c IV Abx followed by oral Abx.  He understands this may not work well and he may develop more Sepsis/Cellulitis or gangrene requiring a future amputation.  He realizes that he may die b/c of his med issues.  If this is the case he asked me to make sure he is comfortable.  He will follow up c me and Dr Donnetta Hutching.  Appt c Dr Donnetta Hutching 10/28/14 He will go home c [facility] services He cancelled L Foot X ray Dr Tommy Medal will also see him back in F/up  Hypersomnolence and lethargy - better post hydration, IVF and fentanyl off.  On prn Narcs for pain  Known PAD.  He had LE Arteriogram 9/14 = complete occlusion of his right superficial femoral artery at the origin with reconstitution of the above-knee popliteal. He has open tibial runoff initially but has extensive small vessel disease into his foot.  MRI 9/14 showed no definitive evidence of Osteo involvement of his calcaneus. Sed rate on 9/15 was 65.  Last saw Dr Donnetta Hutching 9/22 and they discussed right femoral to above-knee popliteal bypass if he has progression of tissue loss.  Seen By [facility] surgery yesterday and discussed options.  Repeat ABIs ABI / TBI  R 0.65 / 0.26 and L 0.86 / 0.33.  Await current MRI.  Discussion @ [facility] vrs amputation vrs more conservative options being had and he elects Abx and conservative Rx.  Min AAA  MAT c PACs/PVCs - Cardizem gtt transitioned to oral Cardizem CD 360.  Holding hep gtt - not needed.  Appreciate Cards help - they signed off.  Had ECHO 08/2014 done c murmur - 08/2014 EF 65-70%. Mild LVH. Mod AS. Grade 1 Diastolic Dysfunction. Pulm HTN c PAP  pressures 32mmHg.   Sacral Decub - Barrier cream to buttocks to protect and repel moisture.  Float heel to reduce pressure.   Volume overload c LE Edema in pt c Nml EF but  known DD - Elevated Pro-BNP c nml Trop I - Diuretics Low Albumin - Protein Malnutrition. Supplement Mild Anemia - Hbg 10.3- 12.7  Mild Thrombocytopenia - 126-159 Hypokalemia - Replete. Low Mag - Replete  DVT Proph ordered CBGs 137-212. A1C 6.2. CBGs BID.  Started Tradjenta.  CKD3 - Cr 1.50 - 1.83 COPD - On Rxs. Pulm Toilet.  HTN - BP fine to a little high RLS - Sinemet is home med Obesity-encourage weight loss  Depression - Restart Sertraline and Seroquel. Moderate aortic stenosis-should not be of clinical consequence. Continue to monitor clinically.  Improve pain control. D/c by Friday if possible. HH and IV Abx.  Agree that he has a high Morbidity and Mortality regardless of what we do.   ID -  Anti-infectives   Start     Dose/Rate Route Frequency Ordered Stop   10/07/14 0600  vancomycin (VANCOCIN) 1,500 mg in sodium chloride 0.9 % 500 mL IVPB  Status:  Discontinued     1,500 mg 250 mL/hr over 120 Minutes Intravenous Every 24 hours 10/06/14 1057 10/07/14 1110   10/06/14 1400  metroNIDAZOLE (FLAGYL) tablet 500 mg  Status:  Discontinued     500 mg Oral 3 times per day 10/06/14 0959 10/08/14 0945   10/06/14 1200  cefTRIAXone (ROCEPHIN) 2 g in dextrose 5 % 50 mL IVPB     2 g 100 mL/hr over 30 Minutes Intravenous Every 24 hours 10/06/14 0959     10/06/14 0500  vancomycin (VANCOCIN) IVPB 1000 mg/200 mL premix  Status:  Discontinued     1,000 mg 200 mL/hr over 60 Minutes Intravenous Every 12 hours 10/05/14 1700 10/06/14 1057   10/06/14 0000  piperacillin-tazobactam (ZOSYN) IVPB 3.375 g  Status:  Discontinued     3.375 g 12.5 mL/hr over 240 Minutes Intravenous Every 8 hours 10/05/14 1700 10/06/14 0954   10/05/14 1600  piperacillin-tazobactam (ZOSYN) IVPB 3.375 g     3.375 g 100 mL/hr over 30 Minutes Intravenous  Once 10/05/14 1556 10/05/14 1902   10/05/14 1600  vancomycin (VANCOCIN) IVPB 1000 mg/200 mL premix     1,000 mg 200 mL/hr over 60 Minutes Intravenous  Once 10/05/14  1556 10/05/14 1750       LOS: 4 days   [name] M 10/09/2014, 7:35 AM

## 2014-10-09 NOTE — Progress Notes (Signed)
RN at bedside to monitor tele during intermittent down time. Aziza Stuckert A

## 2014-10-09 NOTE — Evaluation (Signed)
Physical Therapy Evaluation-1x Patient Details Name: GREEN QUINCY MRN: [id number] DOB: 1929-06-15 Today's Date: 10/09/2014   History of Present Illness  80 yo male admitted with sepsis, chronic R heel ulcer now with osteomyelitis. Hx of PVD, COPD, HTN, sacral decubitis  Clinical Impression  On eval, pt was supervision level assist for mobility-able to ambulate ~60 feet with RW. Pt reports 10/10 pain with activity however he tolerated ambulation fairly well. Mod encouragement for pt to participate with evaluation-pt self limited session. Discussed stair negotiation with pt/wife. Wife states son will assist with getting pt into home. Educated pt/wife on step-to technique with use of railings (up with good 1st, down with bad 1st). Per RN, pt may possibly d/c home later this evening. Discussed HHPT-pt may not agree to this. Recommend follow-up to ensure safe mobility in home environment. 1x eval due to pending d/c.     Follow Up Recommendations Home health PT;Supervision/Assistance - 24 hour (if pt will agree)    Equipment Recommendations  None recommended by PT    Recommendations for Other Services       Precautions / Restrictions Precautions Precautions: Fall Restrictions Weight Bearing Restrictions: No      Mobility  Bed Mobility Overal bed mobility: Modified Independent                Transfers Overall transfer level: Needs assistance Equipment used: Rolling walker (2 wheeled) Transfers: Sit to/from Stand Sit to Stand: Supervision         General transfer comment: VCs hand placement. Multiple attempts from low bed surface.   Ambulation/Gait Ambulation/Gait assistance: Supervision Ambulation Distance (Feet): 60 Feet Assistive device: Rolling walker (2 wheeled) Gait Pattern/deviations: Step-through pattern;Trunk flexed;Antalgic     General Gait Details: slow gait speed. Pt reports pain when ambulating but able to participate  Stairs             Wheelchair Mobility    Modified Rankin (Stroke Patients Only)       Balance                                             Pertinent Vitals/Pain Pain Assessment: 0-10 Pain Score: 10-Worst pain ever Pain Location: R foot Pain Intervention(s): Limited activity within patient's tolerance;Monitored during session;Repositioned;Relaxation    Home Living Family/patient expects to be discharged to:: Private residence Living Arrangements: Spouse/significant other Available Help at Discharge: Family Type of Home: House Home Access: Stairs to enter Entrance Stairs-Rails: Psychiatric nurse of Steps: 4 Home Layout: One level Home Equipment: Environmental consultant - 2 wheels      Prior Function Level of Independence: Independent with assistive device(s)               Hand Dominance        Extremity/Trunk Assessment   Upper Extremity Assessment: Generalized weakness           Lower Extremity Assessment: Generalized weakness      Cervical / Trunk Assessment: Kyphotic  Communication   Communication: HOH  Cognition Arousal/Alertness: Awake/alert Behavior During Therapy: WFL for tasks assessed/performed Overall Cognitive Status: Within Functional Limits for tasks assessed                      General Comments      Exercises        Assessment/Plan    PT Assessment All further PT needs  can be met in the next venue of care  PT Diagnosis Difficulty walking;Acute pain   PT Problem List Decreased activity tolerance;Decreased balance;Decreased mobility;Pain;Decreased knowledge of use of DME  PT Treatment Interventions     PT Goals (Current goals can be found in the Care Plan section) Acute Rehab PT Goals Patient Stated Goal: home PT Goal Formulation: All assessment and education complete, DC therapy    Frequency     Barriers to discharge        Co-evaluation               End of Session Equipment Utilized During  Treatment: Gait belt Activity Tolerance: Patient limited by pain Patient left: in bed;with call bell/phone within reach;with family/visitor present           Time: 1438-8875 PT Time Calculation (min): 10 min   Charges:   PT Evaluation $Initial PT Evaluation Tier I: 1 Procedure     PT G Codes:          Weston Anna, MPT Pager: [phone]

## 2014-10-12 LAB — CULTURE, BLOOD (ROUTINE X 2)
CULTURE: NO GROWTH
Culture: NO GROWTH

## 2014-10-27 ENCOUNTER — Encounter: Payer: Self-pay | Admitting: Vascular Surgery

## 2014-10-28 ENCOUNTER — Ambulatory Visit (INDEPENDENT_AMBULATORY_CARE_PROVIDER_SITE_OTHER): Payer: Medicare Other | Admitting: Vascular Surgery

## 2014-10-28 ENCOUNTER — Encounter: Payer: Self-pay | Admitting: Vascular Surgery

## 2014-10-28 VITALS — BP 132/59 | HR 72 | Resp 20 | Ht 68.0 in | Wt 243.3 lb

## 2014-10-28 DIAGNOSIS — I739 Peripheral vascular disease, unspecified: Secondary | ICD-10-CM

## 2014-10-28 DIAGNOSIS — L98499 Non-pressure chronic ulcer of skin of other sites with unspecified severity: Secondary | ICD-10-CM

## 2014-10-28 DIAGNOSIS — I70209 Unspecified atherosclerosis of native arteries of extremities, unspecified extremity: Secondary | ICD-10-CM

## 2014-10-28 NOTE — Progress Notes (Signed)
Here today for follow-up of his heel wound. Very difficult situation. His wife is present as well. She is to have colostomy closure week. He was recently admitted to [facility] with the foot wound. Imaging studies that time suggested posteriorly at suggestion of amputation from orthopedic specialist. He does have known right superficial femoral artery occlusion with arteriogram in September 2015.  He does report pain is ongoing which is managed with pain medication. He does report some increased swelling.  On physical exam he does have a varus a punctate area that is rather deep on the plantar aspect of his heel.  No evidence of obvious infection or purulence currently.  Long discussion with the patient and his wife. Feel it is unlikely that he will be able to heal this since he does have bone involvement by MRI. I do not feel that femoral-popliteal bypass would improve his chances for healing this. His foot does appear to be well perfused with brisk collaterals he has around SFA occlusion. In his debilitated state I would not recommend femoral-popliteal bypass since there is very little chance this would improve his chance for limb salvage. He understands the high risk for amputation. We will see him in 1 month for continued follow-up

## 2014-11-11 ENCOUNTER — Encounter: Payer: Self-pay | Admitting: Infectious Disease

## 2014-11-11 ENCOUNTER — Ambulatory Visit (INDEPENDENT_AMBULATORY_CARE_PROVIDER_SITE_OTHER): Payer: Medicare Other | Admitting: Infectious Disease

## 2014-11-11 DIAGNOSIS — L98499 Non-pressure chronic ulcer of skin of other sites with unspecified severity: Secondary | ICD-10-CM

## 2014-11-11 DIAGNOSIS — M86671 Other chronic osteomyelitis, right ankle and foot: Secondary | ICD-10-CM

## 2014-11-11 DIAGNOSIS — I739 Peripheral vascular disease, unspecified: Secondary | ICD-10-CM

## 2014-11-11 DIAGNOSIS — I7025 Atherosclerosis of native arteries of other extremities with ulceration: Secondary | ICD-10-CM

## 2014-11-11 DIAGNOSIS — A401 Sepsis due to streptococcus, group B: Secondary | ICD-10-CM

## 2014-11-11 DIAGNOSIS — L98494 Non-pressure chronic ulcer of skin of other sites with necrosis of bone: Secondary | ICD-10-CM

## 2014-11-11 DIAGNOSIS — L089 Local infection of the skin and subcutaneous tissue, unspecified: Secondary | ICD-10-CM

## 2014-11-11 DIAGNOSIS — R7881 Bacteremia: Secondary | ICD-10-CM

## 2014-11-11 MED ORDER — AMOXICILLIN 500 MG PO TABS: 500.0000 mg | ORAL_TABLET | Freq: Two times a day (BID) | ORAL | Status: DC

## 2014-11-11 NOTE — Progress Notes (Signed)
Subjective:    Patient ID: [name], male    DOB: Jun 13, 1929, 80 y.o.   MRN: [id number]  HPI  80 y.o. male with Multiple medical problems including "prediabetes", extensive smoking PVD occluded superficial femoral with ulcer that has been not resolved over 6 month + period with recurrent episodes of purulent drainage now with septic shock from gGroup B streptococcal bacteremia whom I saw as an inpatient at [facility]. His MRI showed calcaneal osteomyelitis that VVS, Orthopedics (Dr.Hewitt) and Primary Care (Dr. Virgina Jock) and I agreed would not be curable absent a BKA vs AKA. Patient however did not want to risk surgery nor lose leg and wished to proceed with idea of IV antibiotics followed by pills for suppression and he still feels the same day. He is having considerable pain in his foot today and requested pain meds but I told him he needed to take up that issue with Dr. Virgina Jock. He very much likes the home health aide he is having take care of him from [facility].  Review of Systems  Constitutional: Negative for fever, chills, diaphoresis, activity change, appetite change, fatigue and unexpected weight change.  HENT: Negative for congestion, rhinorrhea, sinus pressure, sneezing, sore throat and trouble swallowing.   Eyes: Negative for photophobia and visual disturbance.  Respiratory: Negative for cough.   Cardiovascular: Positive for leg swelling. Negative for palpitations.  Gastrointestinal: Negative for nausea, vomiting, abdominal pain, diarrhea, constipation, blood in stool, abdominal distention and anal bleeding.  Genitourinary: Negative for dysuria, hematuria, flank pain and difficulty urinating.  Musculoskeletal: Positive for myalgias and arthralgias. Negative for back pain, joint swelling and gait problem.  Skin: Positive for color change and wound. Negative for pallor and rash.  Neurological: Negative for dizziness, tremors, weakness and light-headedness.  Hematological: Negative for  adenopathy. Does not bruise/bleed easily.  Psychiatric/Behavioral: Negative for behavioral problems, confusion, sleep disturbance, dysphoric mood and decreased concentration.       Objective:   Physical Exam  Constitutional: He is oriented to person, place, and time. He appears well-developed and well-nourished. No distress.  HENT:  Head: Normocephalic and atraumatic.  Mouth/Throat: Oropharynx is clear and moist. No oropharyngeal exudate.  Eyes: Conjunctivae and EOM are normal. No scleral icterus.  Neck: Normal range of motion. Neck supple. No JVD present.  Cardiovascular: Normal rate and regular rhythm.  Exam reveals no friction rub.   Pulmonary/Chest: Effort normal and breath sounds normal. No respiratory distress. He has no wheezes.  Abdominal: He exhibits no distension.  Musculoskeletal: He exhibits edema. He exhibits no tenderness.  Neurological: He is alert and oriented to person, place, and time. He exhibits normal muscle tone. Coordination normal.  Skin: Skin is warm and dry. He is not diaphoretic. There is erythema. No pallor.  Psychiatric: He has a normal mood and affect. His behavior is normal. Judgment and thought content normal.  Nursing note and vitals reviewed.  Heel while an inpatient:       Heel today 11/11/14:     PICC is clean dry and intact     Assessment & Plan:   Group B streptococcal bacteremia and septic shock secondary to osteomyelitis of his calcaneous:  He will complete 6 weeks of IV abx on Thanksgiving day. ESR is still unsurprisingly high at 56 with CRP of 24.5  As I have explained to him with other consultants and PCP and as I explained to him with his son again today, amputation is the ONLY way to ensure cure and to give greatest  chance of avoiding yet another episode of uncontrolled infection including sepsis and death, however as before he does not want to lose his leg and enjoys his quality of life and I think this is reasonable well thought  thru decision  We will therefore have him finish his ceftriaxone on Thanksgiving and then have him start Amoxicillin 565m bid indefinitely  He should continue with wound care, off loading and to follow-up with PCP, [facility]  I spent greater than 40 minutes with the patient including greater than 50% of time in face to face counsel of the patient and in coordination of their care.

## 2014-11-12 ENCOUNTER — Telehealth: Payer: Self-pay | Admitting: *Deleted

## 2014-11-12 NOTE — Telephone Encounter (Signed)
Advanced nurse called and advised they needed to verify the stop/pull date for the patient PICC. Advised her per the doctor note on 11/11/14 the PICC can be D/C 11/13/14.

## 2014-11-24 ENCOUNTER — Encounter: Payer: Self-pay | Admitting: Vascular Surgery

## 2014-11-25 ENCOUNTER — Other Ambulatory Visit: Payer: Self-pay | Admitting: Licensed Clinical Social Worker

## 2014-11-25 ENCOUNTER — Encounter: Payer: Self-pay | Admitting: Vascular Surgery

## 2014-11-25 ENCOUNTER — Ambulatory Visit (INDEPENDENT_AMBULATORY_CARE_PROVIDER_SITE_OTHER): Payer: Medicare Other | Admitting: Vascular Surgery

## 2014-11-25 VITALS — BP 156/91 | HR 41 | Resp 18 | Ht 72.0 in | Wt 234.0 lb

## 2014-11-25 DIAGNOSIS — I70209 Unspecified atherosclerosis of native arteries of extremities, unspecified extremity: Secondary | ICD-10-CM

## 2014-11-25 DIAGNOSIS — I739 Peripheral vascular disease, unspecified: Secondary | ICD-10-CM

## 2014-11-25 DIAGNOSIS — L98499 Non-pressure chronic ulcer of skin of other sites with unspecified severity: Secondary | ICD-10-CM

## 2014-11-25 MED ORDER — AMOXICILLIN 500 MG PO TABS: 500.0000 mg | ORAL_TABLET | Freq: Two times a day (BID) | ORAL | Status: DC

## 2014-11-25 NOTE — Progress Notes (Signed)
     Subjective  - [name] is here for a follow up visit for his right heel ulcer.  He reports that his heel pain  Is worse at the end of the day.  He has had a home health nurse until a few days ago doing dressing changes and wound checks.  His son is doing them now.  Daily iodaform guaze wick and dry dressing.  He is on oral antibiotics and is followed by Dr. Tommy Medal.     Objective 156/91 41   18     Right heel wound  depth has decreased and there is no erythema.  It continues to heal.  Assessment/Planning:  arteriogram did reveal complete occlusion of the superficial femoral artery at its origin. He had extensive collaterals to the deep femoral artery with reconstitution of the above-knee popliteal artery with two-vessel runoff.   We recommend continue daily dressing changes with home health RN for wound checks daily.  Weight bearing as needed and elevation is encouraged.  He will f/u with Dr. Donnetta Hutching in 1 month.  He was seen in clinic by Dr. Donnetta Hutching today. I have examined the patient, reviewed and agree with above. Improving superficial wound. Explained if still high risk for nonhealing.  EARLY, TODD, MD 11/25/2014 4:50 PM

## 2014-11-27 ENCOUNTER — Encounter (HOSPITAL_COMMUNITY): Payer: Self-pay | Admitting: Vascular Surgery

## 2014-11-27 ENCOUNTER — Other Ambulatory Visit: Payer: Self-pay | Admitting: *Deleted

## 2014-11-27 DIAGNOSIS — M86671 Other chronic osteomyelitis, right ankle and foot: Secondary | ICD-10-CM

## 2014-11-27 MED ORDER — AMOXICILLIN 500 MG PO TABS: 500.0000 mg | ORAL_TABLET | Freq: Two times a day (BID) | ORAL | Status: DC

## 2014-12-05 ENCOUNTER — Encounter: Payer: Self-pay | Admitting: Infectious Disease

## 2014-12-09 ENCOUNTER — Ambulatory Visit (INDEPENDENT_AMBULATORY_CARE_PROVIDER_SITE_OTHER): Payer: Medicare Other | Admitting: Infectious Disease

## 2014-12-09 ENCOUNTER — Encounter: Payer: Self-pay | Admitting: Infectious Disease

## 2014-12-09 VITALS — BP 144/73 | HR 73 | Temp 98.8°F

## 2014-12-09 DIAGNOSIS — M86671 Other chronic osteomyelitis, right ankle and foot: Secondary | ICD-10-CM

## 2014-12-09 DIAGNOSIS — A401 Sepsis due to streptococcus, group B: Secondary | ICD-10-CM

## 2014-12-09 DIAGNOSIS — R7881 Bacteremia: Secondary | ICD-10-CM

## 2014-12-09 NOTE — Progress Notes (Signed)
Subjective:    Patient ID: [name], male    DOB: 11/16/1929, 80 y.o.   MRN: [id number]  HPI   80 y.o. male with Multiple medical problems including "prediabetes", extensive smoking PVD occluded superficial femoral with ulcer that has been not resolved over 6 month + period with recurrent episodes of purulent drainage now with septic shock from gGroup B streptococcal bacteremia whom I saw as an inpatient at [facility]. His MRI showed calcaneal osteomyelitis that VVS, Orthopedics (Dr.Hewitt) and Primary Care (Dr. Virgina Jock) and I agreed would not be curable absent a BKA vs AKA. Patient however did not want to risk surgery nor lose leg and wished to proceed with idea of IV antibiotics followed by pills for suppression.  He has since transitioned over to oral amoxicillin and tolerating it fine. His heel wound is healing up but he did twist his ankle and fell and has had more pain recently.  They (son) is changing the dressing every other day. He would like to have his Home health aide back from [facility].   Review of Systems  Constitutional: Negative for fever, chills, diaphoresis, activity change, appetite change, fatigue and unexpected weight change.  HENT: Negative for congestion, rhinorrhea, sinus pressure, sneezing, sore throat and trouble swallowing.   Eyes: Negative for photophobia and visual disturbance.  Respiratory: Negative for cough.   Cardiovascular: Positive for leg swelling. Negative for palpitations.  Gastrointestinal: Negative for nausea, vomiting, abdominal pain, diarrhea, constipation, blood in stool, abdominal distention and anal bleeding.  Genitourinary: Negative for dysuria, hematuria, flank pain and difficulty urinating.  Musculoskeletal: Positive for myalgias and arthralgias. Negative for back pain, joint swelling and gait problem.  Skin: Positive for color change and wound. Negative for pallor and rash.  Neurological: Negative for dizziness, tremors, weakness and  light-headedness.  Hematological: Negative for adenopathy. Does not bruise/bleed easily.  Psychiatric/Behavioral: Negative for behavioral problems, confusion, sleep disturbance, dysphoric mood and decreased concentration.       Objective:   Physical Exam  Constitutional: He is oriented to person, place, and time. He appears well-developed and well-nourished. No distress.  HENT:  Head: Normocephalic and atraumatic.  Mouth/Throat: Oropharynx is clear and moist. No oropharyngeal exudate.  Eyes: Conjunctivae and EOM are normal. No scleral icterus.  Neck: Normal range of motion. Neck supple. No JVD present.  Cardiovascular: Normal rate and regular rhythm.  Exam reveals no friction rub.   Pulmonary/Chest: Effort normal and breath sounds normal. No respiratory distress. He has no wheezes.  Abdominal: He exhibits no distension.  Musculoskeletal: He exhibits edema. He exhibits no tenderness.  Neurological: He is alert and oriented to person, place, and time. He exhibits normal muscle tone. Coordination normal.  Skin: Skin is warm and dry. He is not diaphoretic. There is erythema. No pallor.  Psychiatric: He has a normal mood and affect. His behavior is normal. Judgment and thought content normal.  Nursing note and vitals reviewed.  Heel while an inpatient:       Heel 01/11/14:     Heel today 12/09/14:         Assessment & Plan:   Group B streptococcal bacteremia and septic shock secondary to osteomyelitis of his calcaneous:  Completed  6 weeks of IV abx on Thanksgiving day. ESR was  unsurprisingly high at 56 with CRP of 24.5  continue  Amoxicillin 553m bid indefinitely  He should continue with wound care, off loading and to follow-up with PCP, VVS see me in 2 months , sooner if  problems  I spent greater than 25  minutes with the patient including greater than 50% of time in face to face counsel of the patient and in coordination of their care.

## 2014-12-16 ENCOUNTER — Encounter: Payer: Self-pay | Admitting: Infectious Disease

## 2014-12-29 ENCOUNTER — Encounter: Payer: Self-pay | Admitting: Vascular Surgery

## 2014-12-30 ENCOUNTER — Ambulatory Visit: Payer: Medicare Other | Admitting: Vascular Surgery

## 2015-01-12 ENCOUNTER — Encounter: Payer: Self-pay | Admitting: Vascular Surgery

## 2015-01-13 ENCOUNTER — Ambulatory Visit: Payer: Self-pay | Admitting: Vascular Surgery

## 2015-01-30 ENCOUNTER — Encounter: Payer: Self-pay | Admitting: Vascular Surgery

## 2015-02-03 ENCOUNTER — Ambulatory Visit: Payer: Self-pay | Admitting: Vascular Surgery

## 2015-02-09 ENCOUNTER — Ambulatory Visit: Payer: Medicare Other | Admitting: Infectious Disease

## 2015-02-16 ENCOUNTER — Encounter: Payer: Self-pay | Admitting: Vascular Surgery

## 2015-02-16 ENCOUNTER — Ambulatory Visit: Payer: Self-pay | Admitting: Infectious Disease

## 2015-02-17 ENCOUNTER — Ambulatory Visit: Payer: Self-pay | Admitting: Vascular Surgery

## 2015-02-19 ENCOUNTER — Telehealth: Payer: Self-pay | Admitting: Vascular Surgery

## 2015-02-19 NOTE — Telephone Encounter (Signed)
-----   Message from Mena Goes, RN sent at 02/17/2015 12:33 PM EST ----- Regarding: RE: ? follow up Yes i would make another appt in 1 month to see TFE.  ----- Message -----    From: Gena Fray    Sent: 02/17/2015  12:04 PM      To: [facility] Clinical Pool Subject: ? follow up                                    Mr [name] had an appointment today with TFE, but his wife canceled it due to his leg being wrapped by his PCP. She said the ulcer is almost healed. She wants to know if they need to r.s Mr O'Daniels appointment once the wraps are complete.

## 2015-03-10 ENCOUNTER — Ambulatory Visit: Payer: Self-pay | Admitting: Infectious Disease

## 2015-03-24 ENCOUNTER — Ambulatory Visit: Payer: Self-pay | Admitting: Vascular Surgery

## 2015-03-30 ENCOUNTER — Encounter: Payer: Self-pay | Admitting: Vascular Surgery

## 2015-03-31 ENCOUNTER — Ambulatory Visit: Payer: Self-pay | Admitting: Vascular Surgery

## 2015-07-02 ENCOUNTER — Inpatient Hospital Stay (HOSPITAL_COMMUNITY)
Admission: EM | Admit: 2015-07-02 | Discharge: 2015-07-04 | DRG: 291 | Disposition: A | Discharge status: Home Health | Payer: Medicare Other | Attending: Internal Medicine | Admitting: Internal Medicine

## 2015-07-02 ENCOUNTER — Emergency Department (HOSPITAL_COMMUNITY): Payer: Medicare Other

## 2015-07-02 ENCOUNTER — Ambulatory Visit (HOSPITAL_COMMUNITY): Payer: Medicare Other

## 2015-07-02 ENCOUNTER — Other Ambulatory Visit: Payer: Self-pay

## 2015-07-02 ENCOUNTER — Encounter (HOSPITAL_COMMUNITY): Payer: Self-pay | Admitting: *Deleted

## 2015-07-02 DIAGNOSIS — Z9889 Other specified postprocedural states: Secondary | ICD-10-CM | POA: Diagnosis not present

## 2015-07-02 DIAGNOSIS — J9601 Acute respiratory failure with hypoxia: Secondary | ICD-10-CM | POA: Diagnosis present

## 2015-07-02 DIAGNOSIS — E1122 Type 2 diabetes mellitus with diabetic chronic kidney disease: Secondary | ICD-10-CM | POA: Diagnosis present

## 2015-07-02 DIAGNOSIS — N183 Chronic kidney disease, stage 3 unspecified: Secondary | ICD-10-CM

## 2015-07-02 DIAGNOSIS — N179 Acute kidney failure, unspecified: Secondary | ICD-10-CM | POA: Diagnosis present

## 2015-07-02 DIAGNOSIS — M13841 Other specified arthritis, right hand: Secondary | ICD-10-CM | POA: Diagnosis present

## 2015-07-02 DIAGNOSIS — E039 Hypothyroidism, unspecified: Secondary | ICD-10-CM | POA: Diagnosis present

## 2015-07-02 DIAGNOSIS — Z79891 Long term (current) use of opiate analgesic: Secondary | ICD-10-CM | POA: Diagnosis not present

## 2015-07-02 DIAGNOSIS — J449 Chronic obstructive pulmonary disease, unspecified: Secondary | ICD-10-CM | POA: Diagnosis present

## 2015-07-02 DIAGNOSIS — F419 Anxiety disorder, unspecified: Secondary | ICD-10-CM | POA: Diagnosis present

## 2015-07-02 DIAGNOSIS — Z6828 Body mass index (BMI) 28.0-28.9, adult: Secondary | ICD-10-CM | POA: Diagnosis not present

## 2015-07-02 DIAGNOSIS — R0602 Shortness of breath: Secondary | ICD-10-CM

## 2015-07-02 DIAGNOSIS — I350 Nonrheumatic aortic (valve) stenosis: Secondary | ICD-10-CM

## 2015-07-02 DIAGNOSIS — I70245 Atherosclerosis of native arteries of left leg with ulceration of other part of foot: Secondary | ICD-10-CM | POA: Diagnosis present

## 2015-07-02 DIAGNOSIS — L97529 Non-pressure chronic ulcer of other part of left foot with unspecified severity: Secondary | ICD-10-CM | POA: Diagnosis present

## 2015-07-02 DIAGNOSIS — E1159 Type 2 diabetes mellitus with other circulatory complications: Secondary | ICD-10-CM | POA: Diagnosis not present

## 2015-07-02 DIAGNOSIS — Z79899 Other long term (current) drug therapy: Secondary | ICD-10-CM

## 2015-07-02 DIAGNOSIS — K219 Gastro-esophageal reflux disease without esophagitis: Secondary | ICD-10-CM | POA: Diagnosis present

## 2015-07-02 DIAGNOSIS — W19XXXA Unspecified fall, initial encounter: Secondary | ICD-10-CM | POA: Diagnosis present

## 2015-07-02 DIAGNOSIS — I7025 Atherosclerosis of native arteries of other extremities with ulceration: Secondary | ICD-10-CM | POA: Diagnosis present

## 2015-07-02 DIAGNOSIS — Z7982 Long term (current) use of aspirin: Secondary | ICD-10-CM

## 2015-07-02 DIAGNOSIS — Z833 Family history of diabetes mellitus: Secondary | ICD-10-CM

## 2015-07-02 DIAGNOSIS — E1142 Type 2 diabetes mellitus with diabetic polyneuropathy: Secondary | ICD-10-CM | POA: Diagnosis present

## 2015-07-02 DIAGNOSIS — I700 Atherosclerosis of aorta: Secondary | ICD-10-CM | POA: Diagnosis present

## 2015-07-02 DIAGNOSIS — L98499 Non-pressure chronic ulcer of skin of other sites with unspecified severity: Secondary | ICD-10-CM

## 2015-07-02 DIAGNOSIS — I739 Peripheral vascular disease, unspecified: Secondary | ICD-10-CM

## 2015-07-02 DIAGNOSIS — Z87891 Personal history of nicotine dependence: Secondary | ICD-10-CM | POA: Diagnosis not present

## 2015-07-02 DIAGNOSIS — L97419 Non-pressure chronic ulcer of right heel and midfoot with unspecified severity: Secondary | ICD-10-CM | POA: Diagnosis present

## 2015-07-02 DIAGNOSIS — J811 Chronic pulmonary edema: Secondary | ICD-10-CM | POA: Diagnosis present

## 2015-07-02 DIAGNOSIS — F329 Major depressive disorder, single episode, unspecified: Secondary | ICD-10-CM | POA: Diagnosis present

## 2015-07-02 DIAGNOSIS — I70234 Atherosclerosis of native arteries of right leg with ulceration of heel and midfoot: Secondary | ICD-10-CM | POA: Diagnosis present

## 2015-07-02 DIAGNOSIS — M545 Low back pain: Secondary | ICD-10-CM | POA: Diagnosis present

## 2015-07-02 DIAGNOSIS — E785 Hyperlipidemia, unspecified: Secondary | ICD-10-CM | POA: Diagnosis present

## 2015-07-02 DIAGNOSIS — D72829 Elevated white blood cell count, unspecified: Secondary | ICD-10-CM | POA: Diagnosis present

## 2015-07-02 DIAGNOSIS — N529 Male erectile dysfunction, unspecified: Secondary | ICD-10-CM | POA: Diagnosis present

## 2015-07-02 DIAGNOSIS — G8929 Other chronic pain: Secondary | ICD-10-CM | POA: Diagnosis present

## 2015-07-02 DIAGNOSIS — M13842 Other specified arthritis, left hand: Secondary | ICD-10-CM | POA: Diagnosis present

## 2015-07-02 DIAGNOSIS — I714 Abdominal aortic aneurysm, without rupture: Secondary | ICD-10-CM | POA: Diagnosis present

## 2015-07-02 DIAGNOSIS — I5033 Acute on chronic diastolic (congestive) heart failure: Principal | ICD-10-CM | POA: Diagnosis present

## 2015-07-02 DIAGNOSIS — I5031 Acute diastolic (congestive) heart failure: Secondary | ICD-10-CM | POA: Diagnosis not present

## 2015-07-02 DIAGNOSIS — I129 Hypertensive chronic kidney disease with stage 1 through stage 4 chronic kidney disease, or unspecified chronic kidney disease: Secondary | ICD-10-CM | POA: Diagnosis present

## 2015-07-02 DIAGNOSIS — J810 Acute pulmonary edema: Secondary | ICD-10-CM

## 2015-07-02 DIAGNOSIS — Y92009 Unspecified place in unspecified non-institutional (private) residence as the place of occurrence of the external cause: Secondary | ICD-10-CM

## 2015-07-02 DIAGNOSIS — K224 Dyskinesia of esophagus: Secondary | ICD-10-CM | POA: Diagnosis present

## 2015-07-02 DIAGNOSIS — R0902 Hypoxemia: Secondary | ICD-10-CM

## 2015-07-02 LAB — I-STAT TROPONIN, ED: Troponin i, poc: 0.01 ng/mL (ref 0.00–0.08)

## 2015-07-02 LAB — URINALYSIS, ROUTINE W REFLEX MICROSCOPIC
Glucose, UA: NEGATIVE mg/dL
HGB URINE DIPSTICK: NEGATIVE
KETONES UR: 15 mg/dL — AB
Leukocytes, UA: NEGATIVE
Nitrite: NEGATIVE
PROTEIN: 30 mg/dL — AB
Specific Gravity, Urine: 1.021 (ref 1.005–1.030)
UROBILINOGEN UA: 1 mg/dL (ref 0.0–1.0)
pH: 5 (ref 5.0–8.0)

## 2015-07-02 LAB — BASIC METABOLIC PANEL
Anion gap: 13 (ref 5–15)
BUN: 25 mg/dL — AB (ref 6–20)
CALCIUM: 8.5 mg/dL — AB (ref 8.9–10.3)
CO2: 20 mmol/L — ABNORMAL LOW (ref 22–32)
Chloride: 103 mmol/L (ref 101–111)
Creatinine, Ser: 2.11 mg/dL — ABNORMAL HIGH (ref 0.61–1.24)
GFR calc non Af Amer: 27 mL/min — ABNORMAL LOW (ref >60)
GFR, EST AFRICAN AMERICAN: 31 mL/min — AB (ref >60)
Glucose, Bld: 171 mg/dL — ABNORMAL HIGH (ref 65–99)
Potassium: 4.8 mmol/L (ref 3.5–5.1)
Sodium: 136 mmol/L (ref 135–145)

## 2015-07-02 LAB — CBC
HCT: 40 % (ref 39.0–52.0)
Hemoglobin: 13.2 g/dL (ref 13.0–17.0)
MCH: 27.2 pg (ref 26.0–34.0)
MCHC: 33 g/dL (ref 30.0–36.0)
MCV: 82.3 fL (ref 78.0–100.0)
Platelets: 124 10*3/uL — ABNORMAL LOW (ref 150–400)
RBC: 4.86 MIL/uL (ref 4.22–5.81)
RDW: 15.5 % (ref 11.5–15.5)
WBC: 14.3 10*3/uL — ABNORMAL HIGH (ref 4.0–10.5)

## 2015-07-02 LAB — URINE MICROSCOPIC-ADD ON

## 2015-07-02 LAB — TROPONIN I
TROPONIN I: 0.03 ng/mL (ref <0.031)
Troponin I: 0.03 ng/mL (ref <0.031)
Troponin I: 0.03 ng/mL (ref <0.031)

## 2015-07-02 LAB — GLUCOSE, CAPILLARY
Glucose-Capillary: 161 mg/dL — ABNORMAL HIGH (ref 65–99)
Glucose-Capillary: 110 mg/dL — ABNORMAL HIGH (ref 65–99)
Glucose-Capillary: 137 mg/dL — ABNORMAL HIGH (ref 65–99)

## 2015-07-02 LAB — BRAIN NATRIURETIC PEPTIDE: B NATRIURETIC PEPTIDE 5: 181.6 pg/mL — AB (ref 0.0–100.0)

## 2015-07-02 LAB — CBG MONITORING, ED: Glucose-Capillary: 158 mg/dL — ABNORMAL HIGH (ref 65–99)

## 2015-07-02 MED ORDER — DILTIAZEM HCL ER COATED BEADS 360 MG PO CP24
360.0000 mg | ORAL_CAPSULE | Freq: Every day | ORAL | Status: DC
Start: 2015-07-02 — End: 2015-07-04
  Administered 2015-07-02 – 2015-07-04 (×3): 360 mg via ORAL
  Filled 2015-07-02 (×3): qty 1

## 2015-07-02 MED ORDER — ATORVASTATIN CALCIUM 20 MG PO TABS
20.0000 mg | ORAL_TABLET | Freq: Every day | ORAL | Status: DC
  Administered 2015-07-02 – 2015-07-03 (×2): 20 mg via ORAL
  Filled 2015-07-02 (×4): qty 1

## 2015-07-02 MED ORDER — LORAZEPAM 0.5 MG PO TABS
0.5000 mg | ORAL_TABLET | Freq: Three times a day (TID) | ORAL | Status: DC
Start: 2015-07-02 — End: 2015-07-04
  Administered 2015-07-02 – 2015-07-04 (×5): 0.5 mg via ORAL
  Filled 2015-07-02 (×5): qty 1

## 2015-07-02 MED ORDER — SIMVASTATIN 40 MG PO TABS: 40.0000 mg | ORAL_TABLET | Freq: Every day | ORAL | Status: DC

## 2015-07-02 MED ORDER — INSULIN ASPART 100 UNIT/ML ~~LOC~~ SOLN: 0.0000 [IU] | Freq: Three times a day (TID) | SUBCUTANEOUS | Status: DC

## 2015-07-02 MED ORDER — AMOXICILLIN 500 MG PO CAPS
500.0000 mg | ORAL_CAPSULE | Freq: Two times a day (BID) | ORAL | Status: DC
  Administered 2015-07-02 – 2015-07-04 (×5): 500 mg via ORAL
  Filled 2015-07-02 (×6): qty 1

## 2015-07-02 MED ORDER — ACETAMINOPHEN 325 MG PO TABS: 650.0000 mg | ORAL_TABLET | Freq: Four times a day (QID) | ORAL | Status: DC | PRN

## 2015-07-02 MED ORDER — ALBUTEROL SULFATE (2.5 MG/3ML) 0.083% IN NEBU: 2.5000 mg | INHALATION_SOLUTION | Freq: Four times a day (QID) | RESPIRATORY_TRACT | Status: DC | PRN

## 2015-07-02 MED ORDER — ADULT MULTIVITAMIN W/MINERALS CH
1.0000 | ORAL_TABLET | Freq: Every day | ORAL | Status: DC
  Administered 2015-07-02 – 2015-07-04 (×3): 1 via ORAL
  Filled 2015-07-02 (×3): qty 1

## 2015-07-02 MED ORDER — INSULIN ASPART 100 UNIT/ML ~~LOC~~ SOLN
0.0000 [IU] | Freq: Three times a day (TID) | SUBCUTANEOUS | Status: DC
  Administered 2015-07-02: 1 [IU] via SUBCUTANEOUS
  Administered 2015-07-02: 2 [IU] via SUBCUTANEOUS
  Administered 2015-07-03 – 2015-07-04 (×2): 1 [IU] via SUBCUTANEOUS

## 2015-07-02 MED ORDER — AZITHROMYCIN 500 MG IV SOLR
500.0000 mg | Freq: Once | INTRAVENOUS | Status: AC
  Administered 2015-07-02: 500 mg via INTRAVENOUS
  Filled 2015-07-02: qty 500

## 2015-07-02 MED ORDER — SODIUM CHLORIDE 0.9 % IJ SOLN
3.0000 mL | Freq: Two times a day (BID) | INTRAMUSCULAR | Status: DC
  Administered 2015-07-02 – 2015-07-04 (×5): 3 mL via INTRAVENOUS

## 2015-07-02 MED ORDER — HEPARIN SODIUM (PORCINE) 5000 UNIT/ML IJ SOLN
5000.0000 [IU] | Freq: Three times a day (TID) | INTRAMUSCULAR | Status: DC
  Administered 2015-07-02 – 2015-07-04 (×7): 5000 [IU] via SUBCUTANEOUS
  Filled 2015-07-02 (×9): qty 1

## 2015-07-02 MED ORDER — PNEUMOCOCCAL VAC POLYVALENT 25 MCG/0.5ML IJ INJ
0.5000 mL | INJECTION | INTRAMUSCULAR | Status: DC
  Filled 2015-07-02: qty 0.5

## 2015-07-02 MED ORDER — PRO-STAT SUGAR FREE PO LIQD
30.0000 mL | Freq: Two times a day (BID) | ORAL | Status: DC
  Administered 2015-07-02 – 2015-07-04 (×3): 30 mL via ORAL
  Filled 2015-07-02 (×6): qty 30

## 2015-07-02 MED ORDER — OXYCODONE HCL 5 MG PO TABS
10.0000 mg | ORAL_TABLET | ORAL | Status: DC | PRN
  Administered 2015-07-02 – 2015-07-04 (×5): 10 mg via ORAL
  Filled 2015-07-02 (×5): qty 2

## 2015-07-02 MED ORDER — IPRATROPIUM BROMIDE 0.02 % IN SOLN: 500.0000 ug | Freq: Four times a day (QID) | RESPIRATORY_TRACT | Status: DC | PRN

## 2015-07-02 MED ORDER — ENSURE PUDDING PO PUDG: 1.0000 | Freq: Three times a day (TID) | ORAL | Status: DC

## 2015-07-02 MED ORDER — DEXTROSE 5 % IV SOLN
1.0000 g | Freq: Once | INTRAVENOUS | Status: AC
  Administered 2015-07-02: 1 g via INTRAVENOUS
  Filled 2015-07-02: qty 10

## 2015-07-02 MED ORDER — FUROSEMIDE 10 MG/ML IJ SOLN
80.0000 mg | Freq: Once | INTRAMUSCULAR | Status: AC
  Administered 2015-07-02: 80 mg via INTRAVENOUS
  Filled 2015-07-02: qty 8

## 2015-07-02 MED ORDER — GLUCERNA SHAKE PO LIQD
237.0000 mL | Freq: Two times a day (BID) | ORAL | Status: DC
  Administered 2015-07-02 – 2015-07-04 (×4): 237 mL via ORAL

## 2015-07-02 MED ORDER — OXYCODONE HCL 10 MG PO TABS: 10.0000 mg | ORAL_TABLET | ORAL | Status: DC | PRN

## 2015-07-02 MED ORDER — QUETIAPINE FUMARATE 25 MG PO TABS
25.0000 mg | ORAL_TABLET | Freq: Every day | ORAL | Status: DC
  Administered 2015-07-02 – 2015-07-03 (×2): 25 mg via ORAL
  Filled 2015-07-02 (×3): qty 1

## 2015-07-02 MED ORDER — MOMETASONE FURO-FORMOTEROL FUM 100-5 MCG/ACT IN AERO
2.0000 | INHALATION_SPRAY | Freq: Two times a day (BID) | RESPIRATORY_TRACT | Status: DC
  Administered 2015-07-03 – 2015-07-04 (×3): 2 via RESPIRATORY_TRACT
  Filled 2015-07-02 (×3): qty 8.8

## 2015-07-02 MED ORDER — GABAPENTIN 300 MG PO CAPS
300.0000 mg | ORAL_CAPSULE | Freq: Two times a day (BID) | ORAL | Status: DC
  Administered 2015-07-02 – 2015-07-04 (×5): 300 mg via ORAL
  Filled 2015-07-02 (×6): qty 1

## 2015-07-02 MED ORDER — ASPIRIN EC 81 MG PO TBEC
81.0000 mg | DELAYED_RELEASE_TABLET | Freq: Every morning | ORAL | Status: DC
  Administered 2015-07-02 – 2015-07-04 (×3): 81 mg via ORAL
  Filled 2015-07-02 (×3): qty 1

## 2015-07-02 MED ORDER — SODIUM CHLORIDE 0.9 % IV SOLN: 250.0000 mL | INTRAVENOUS | Status: DC | PRN

## 2015-07-02 MED ORDER — CARBIDOPA-LEVODOPA 10-100 MG PO TABS
1.0000 | ORAL_TABLET | Freq: Every evening | ORAL | Status: DC
  Administered 2015-07-02 – 2015-07-03 (×2): 1 via ORAL
  Filled 2015-07-02 (×3): qty 1

## 2015-07-02 MED ORDER — ALBUTEROL SULFATE (5 MG/ML) 0.5% IN NEBU: 2.5000 mg | INHALATION_SOLUTION | Freq: Four times a day (QID) | RESPIRATORY_TRACT | Status: DC | PRN

## 2015-07-02 MED ORDER — ONDANSETRON HCL 4 MG/2ML IJ SOLN: 4.0000 mg | Freq: Four times a day (QID) | INTRAMUSCULAR | Status: DC | PRN

## 2015-07-02 MED ORDER — FUROSEMIDE 10 MG/ML IJ SOLN
40.0000 mg | Freq: Once | INTRAMUSCULAR | Status: AC
  Administered 2015-07-02: 40 mg via INTRAVENOUS
  Filled 2015-07-02: qty 4

## 2015-07-02 MED ORDER — SODIUM CHLORIDE 0.9 % IJ SOLN
3.0000 mL | INTRAMUSCULAR | Status: DC | PRN
Start: 2015-07-02 — End: 2015-07-04

## 2015-07-02 MED ORDER — SERTRALINE HCL 100 MG PO TABS
100.0000 mg | ORAL_TABLET | Freq: Every evening | ORAL | Status: DC
  Administered 2015-07-02 – 2015-07-03 (×2): 100 mg via ORAL
  Filled 2015-07-02 (×3): qty 1

## 2015-07-02 MED ORDER — LEVOTHYROXINE SODIUM 112 MCG PO TABS
112.0000 ug | ORAL_TABLET | Freq: Every day | ORAL | Status: DC
  Administered 2015-07-02 – 2015-07-04 (×3): 112 ug via ORAL
  Filled 2015-07-02 (×4): qty 1

## 2015-07-02 NOTE — Progress Notes (Signed)
I see that my pt got admitted last night for Vol Overload, CHF type Sxs, Hypoxia, Leukocytosis, and progressive Kiney failure as Cr > 2.0. I have included info from my last note in office 04/2015.  He has H/O Dysphagia and H/O Asp PNA as well. Has seen Dr Henrene Pastor for this in past  PMH: R Foot Osteomyelitis 09/2014.  L Foot Arterial Ulcer.  DM2, AAA, Aortic Stenosis  Migraine HA,  Positive Syphilis test (congenital) s/p PCN,  Rheumatic fever,  PAD-RAS s/p PCI 7/06 - Severe LE PAD c claudications; Peripheral neuropathy,  COPD--Smoker,  GERD complicated by peptic stricture/ esophageal diverticula, Hyperlipidemia, Hypothyroidism, HTN, Depression, HOH, CRI/CKD, RLS, ED, Rosacea, Sq Cell Ca of L Ear--Dr Delman Cheadle.  Nephrolithiasis, sleep apnea, PAD,  02/2008 MMSE 29/30.  Sacral Decub 05/2011 Moderate PAD noted. ABI .6 09/2014 admit c Sepsis/Osteomyelitis L Heel Ulcer.  05/2009 PFTs FVC 2.47L, FEV1 1.53L, FEV1/FVC 86%  20-40% improvement post Bonchodilators.  July 24, 2007 Dr Henrene Pastor eval regarding dysphagia, weight loss and an abnormal CT scan (circumferential esophageal wall thickening as well as retained contrast in the esophagus, mild adenopathy and gastric wall   thickening with luminal narrowing, changes in the right lung consistent with aspiration pneumonia, gallstones, and a small umbilical hernia) -- Upper Endo = Strictures/ Diverticulum/ HH/ Esoph Dysmotiliy and S/P Dilitation.  Asp PNA event 08/2012    Impression & Recommendations:  Problem # 1:  Diabetes mellitus, type II, controlled w/ vascular complications     Tradjenta 5 Mg Oral Tabs (Linagliptin) .Marland Kitchen... 1/2 qd    Aspirin 81 Mg Ec Tab (Aspirin) .Marland Kitchen... Take one (1) tablet by mouth daily  Boderline CBGs. Last A1C's 6.2-6.4-5.9. Tradjenta just 2.5 per day Hgb 05/07/15:  A1C: 5.9% B/P: 116/64 Continue Tradjenta at 1/2 dose. I gave some more samples today.   Stay on the drug. tolerating it. A1C was borderline at Dx Hgb A1C: 6.4 @ HOSP      Problem # 2:  Atherosclerosis of aorta   09/2014:  Atherosclerotic calcification is noted in the abdominal aorta. RF reduce. B/P: 116/64  LDL -1 (03/14/2014 11:36:00 AM)   Problem # 3:  Chronic pain   refill for Oxycodone/Fentanyl done.  Uses 6Oxycodone per day On Chronic Pain meds Continue Fentanyl. Discussed. Colace helps c BMs.   Problem # 4:  Osteomyelitis   12/09/14 Group B streptococcal bacteremia and septic shock secondary to osteomyelitis of his calcaneous:  Completed 6 weeks of IV abx on Thanksgiving day. ESR was unsurprisingly high at 56 with CRP of 24.5.  continue Amoxicillin 585m bid indefinitely - They are only doing once per day and can stay with that  He should continue with wound care, off loading, Shoes Group B streptococcal bacteremia and septic shock secondary to osteomyelitis of his calcaneous: Saw Dr VTommy Medal12/22/15 = They will go back prn.  10/06/14 - MRI - Tiny focus of osteomyelitis in the plantar aspect of the calcaneus deep to the ulcer. Diffuse edema compatible with cellulitis.  PICC is out. S/P Rocephin IV till 11/26 Admission 10/05/14 c Sepsis syndrome from R Heel Ulcer Suppressive Abx for the rest of his life  Wearing shoe today and changing UNA Boots 2x per week Still in pain.  No current new obvious Infection.  Some more closure of wound.  The skin looks fine. Amputation - Revascularization again discussed and he is still not interested. He understands r/b/ses and alternative options  Never got back c Dr EDonnetta Hutchingand should go back to  Vasc surgery    Problem # 5:  HYPERTENSION  His updated medication list for this problem includes:    Diltiazem Cd 240 Mg Oral Xr24h-cap (Diltiazem hcl coated beads) .Marland Kitchen... 1 cap qd    Torsemide 10 Mg Oral Tabs (Torsemide) ..... One po daily   BP today: 116/64 Prior BP: 112/70 (03/09/2015)  Labs Reviewed: Creat: 1.6 (02/12/2015) Chol: 121 (03/14/2014)   HDL: 34 (03/31/2009)   LDL: -1 (03/14/2014)       Problem # 6:  Hypertensive chronic kidney disease with stage 1 through stage 4 chronic kidney disease, or unspecified chronic kidney disease    His updated medication list for this problem includes:    Diltiazem Cd 240 Mg Oral Xr24h-cap (Diltiazem hcl coated beads) .Marland Kitchen... 1 cap qd    Torsemide 10 Mg Oral Tabs (Torsemide) ..... One po daily   BP today: 116/64 Prior BP: 112/70 (03/09/2015)  Labs Reviewed: Creat: 1.6 (02/12/2015) Chol: 121 (03/14/2014)   HDL: 34 (03/31/2009)   LDL: -1 (03/14/2014)      Problem # 7:  Morbid obesity He is now relatively immobile and no sig wt loss expected.  BMI:  32.00 (03/09/2015 11:22:34 AM), weight: 223 (03/09/2015 11:22:34 AM), height: 70 (03/09/2015 11:22:34 AM) weight: 233 (11/21/2014 1:41:12 PM) weight: 238 (10/20/2014 4:00:31 PM) Has sig Obesity and Sig MMP. Needs wt loss but needs it while maintaining nutrition.   Problem # 8:  CHRONIC KIDNEY DISEASE STAGE III (MODERATE)   last few Cr's 1.6-1.83 Cr cl @ 38 Cr: 1.7 (12/24/2014)    Cr: 1.6 (02/12/2015)  Recheck labs at next OV    Complete Outpatient Medication List: 1)  Cyclobenzaprine Hcl 10 Mg Oral Tabs (Cyclobenzaprine hcl) .Marland Kitchen.. 1 po bid prn muscle relaxor 2)  Zofran 4 Mg Oral Tabs (Ondansetron hcl) .Marland Kitchen.. 1 po bid prn nausea 3)  Tradjenta 5 Mg Oral Tabs (Linagliptin) .... 1/2 qd 4)  Diltiazem Cd 240 Mg Oral Xr24h-cap (Diltiazem hcl coated beads) .Marland Kitchen.. 1 cap qd 5)  Clotrimazole 1 % Ext Crea (Clotrimazole) .... Apply under abdomen bid x 2 wks, may repeat if needed 6)  Tylenol 325 Mg Oral Tabs (Acetaminophen) .... Take 2 tabs every 6 hrs prn for pain or fever 7)  Albuterol Sulfate (5 Mg/ml) 0.5% Inh Nebu (Albuterol sulfate) .... Take 2.5 mg by nebulization every 6 hrs prn sob 8)  Mupirocin 2 % Oint (Mupirocin) .... Place 1 application in the nose twice a day 9)  Cvs Jock Itch 1 % Ext Crea (Terbinafine hcl) .... Apply bid generously to affected area 10)  Torsemide 10 Mg Oral Tabs  (Torsemide) .... One po daily 11)  Fentanyl 50 Mcg/hr Trans Pt72 (Fentanyl) .... Apply one patch and change q 72 hours.  do not fill until after 05/13/15 12)  Santyl 250 Unit/gm Oint (Collagenase) .... Use santyl to wound qod 13)  Oxycodone Hcl 10 Mg Oral Tabs (Oxycodone hcl) .... Up to 6 per day as needed and directed for pain.  do not fill until > 5/25 14)  Desowen Oint W/cetaphil Lot 0.05 % Kit (Desonide oint-moisturizing lot) .... 2x a day 15)  Gabapentin 300 Mg Caps (Gabapentin) .... Take 1 tablet by mouth 3 times a day 16)  Carbidopa-levodopa 10-100 Mg Tabs (Carbidopa-levodopa) .... Take one tablet before bedtime 17)  Clonazepam 1 Mg Tabs (Clonazepam) .... Take 1/2 tablet in the morning, 1/2 at lunch and 2  tablets in the evening 18)  Remeron 30 Mg Tabs (Mirtazapine) .... Take one tablet before bedtime  19)  Seroquel 50 Mg Tabs (Quetiapine fumarate) .Marland Kitchen.. 1 po qhs 20)  Simvastatin 40 Mg Tabs (Simvastatin) .... Take one tablet at bedtime 21)  Synthroid 112 Mcg Tabs (Levothyroxine sodium) .... Take one tablet daily 22)  Sertraline Hcl 100 Mg Tabs (Sertraline hcl) .Marland Kitchen.. 1 po qd 23)  Aspirin 81 Mg Ec Tab (Aspirin) .... Take one (1) tablet by mouth daily 24)  Melatonin 3 Mg Caps (Melatonin) .Marland Kitchen.. 1 capsule by mouth at night as needed 25)  Ipratropium Bromide 0.02 % Inh Soln (Ipratropium bromide) .... Inhale 59mg by nebulization every 6 hrs prn sob 26)  Viagra 100 Mg Tabs (Sildenafil citrate) .... One po daily prn

## 2015-07-02 NOTE — ED Notes (Signed)
Patient transported to CT SCAN . 

## 2015-07-02 NOTE — Evaluation (Signed)
Clinical/Bedside Swallow Evaluation Patient Details  Name: [name] MRN: [id number] Date of Birth: 07-03-1929  Today's Date: 07/02/2015 Time: SLP Start Time (ACUTE ONLY): 1455 SLP Stop Time (ACUTE ONLY): 1505 SLP Time Calculation (min) (ACUTE ONLY): 10 min  Past Medical History:  Past Medical History  Diagnosis Date  . Hyperlipidemia   . COPD (chronic obstructive pulmonary disease)   . Leg pain   . GERD (gastroesophageal reflux disease)   . Depression   . Dizziness   . Productive cough   . Thyroid disease   . Peripheral vascular disease   . ED (erectile dysfunction)   . Hypertension     08/17/12 - wife denies  . Walking pneumonia 2000's  . H/O hiatal hernia   . Migraine     "when I was a young boy"  . Arthritis     "hands" (08/28/2014)  . Anxiety   . Hypothyroidism   . Chronic kidney disease (CKD), stage III (moderate)     [name] 08/28/2014   Past Surgical History:  Past Surgical History  Procedure Laterality Date  . Renal artery stent Left   . Kidney stone surgery  1989  . Lower extremity angiogram Left 03-20-2014    Dr. Tinnie Gens  . Tonsillectomy    . Cataract extraction, bilateral Bilateral   . Blepharoplasty Right   . Abdominal aortagram N/A 03/20/2013    Procedure: ABDOMINAL Maxcine Ham;  Surgeon: [name], MD;  Location: [facility] CATH LAB;  Service: Cardiovascular;  Laterality: N/A;  . Lower extremity angiogram Left 03/20/2013    Procedure: LOWER EXTREMITY ANGIOGRAM;  Surgeon: [name], MD;  Location: [facility] CATH LAB;  Service: Cardiovascular;  Laterality: Left;   HPI:  81 y.o. male admitted from SNF with acute respiratory failure/hypoxia, esophageal dysmotility disorder (debris in upper esophagus noted on CT neck). Hx of esophageal stricture with dilatation.  Per Dr. Keane Police note,  "July 24, 2007 Dr Henrene Pastor eval regarding dysphagia, weight loss and an abnormal CT scan (circumferential esophageal wall thickening as well as retained contrast in the esophagus,  mild adenopathy and gastric wall thickening with luminal narrowing, changes in the right lung consistent with aspiration pneumonia, gallstones, and a small umbilical hernia) -- Upper Endo = Strictures/ Diverticulum/ HH/ Esoph Dysmotiliy and S/P Dilitation."  Clinical swallow eval 08/18/12 with recs for GI f/u given persisting clinical signs of esophageal deficits.    Assessment / Plan / Recommendation Clinical Impression  Pt presents with a functional oropharyngeal swallow per clinical evaluation.  Given hx described by Dr. Virgina Jock and recent CT showing debris in esophagus, recommend GI involvement again to determine if another dilatation is necessary or appropriate.  No SLP f/u is warranted - continue diet per MD.     Aspiration Risk  Mild    Diet Recommendation  (full liquids per Md)   Medication Administration: Whole meds with liquid Compensations: Follow solids with liquid    Other  Recommendations Recommended Consults: Consider GI evaluation Oral Care Recommendations: Oral care BID   Follow Up Recommendations         Swallow Study Prior Functional Status       General Date of Onset: 07/02/15 Other Pertinent Information: 81 y.o. male admitted from SNF with acute respiratory failure/hypoxia, esophageal dysmotility disorder (debris in upper esophagus noted on CT neck). Hx of esophageal stricture with dilatation.  Per Dr. Keane Police note,  "July 24, 2007 Dr Henrene Pastor eval regarding dysphagia, weight loss and an abnormal CT scan (circumferential esophageal wall thickening as  well as retained contrast in the esophagus, mild adenopathy and gastric wall thickening with luminal narrowing, changes in the right lung consistent with aspiration pneumonia, gallstones, and a small umbilical hernia) -- Upper Endo = Strictures/ Diverticulum/ HH/ Esoph Dysmotiliy and S/P Dilitation."  Clinical swallow eval 08/18/12 with recs for GI f/u given persisting clinical signs of esophageal deficits.  Type of Study:  Bedside swallow evaluation Previous Swallow Assessment: 07/2012 Diet Prior to this Study: Thin liquids Temperature Spikes Noted: No Respiratory Status: Supplemental O2 delivered via (comment) History of Recent Intubation: No Behavior/Cognition: Alert;Cooperative Oral Cavity - Dentition: Missing dentition Self-Feeding Abilities: Able to feed self Patient Positioning: Upright in bed Baseline Vocal Quality: Hoarse Volitional Cough: Strong Volitional Swallow: Able to elicit    Oral/Motor/Sensory Function Overall Oral Motor/Sensory Function: Appears within functional limits for tasks assessed   Ice Chips Ice chips: Within functional limits   Thin Liquid Thin Liquid: Within functional limits Presentation: Cup    Nectar Thick Nectar Thick Liquid: Not tested   Honey Thick Honey Thick Liquid: Not tested   Puree Puree: Within functional limits Presentation: Self Fed;Spoon   Solid   Avelina Mcclurkin L. [place], [place] CCC/SLP Pager [phone]     Solid: Not tested       Juan Quam [facility] 07/02/2015,3:12 PM

## 2015-07-02 NOTE — Evaluation (Signed)
Physical Therapy Evaluation Patient Details Name: [name] MRN: [id number] DOB: Mar 01, 1929 Today's Date: 07/02/2015   History of Present Illness  Patient is a 81 y/o male admitted s/p fall at home and presents with SOB. CXR- mild pulmonary vascular congestion and basilar edema. PMH includes COPD, HLD, PVD, HTN, CKD.   Clinical Impression  Patient presents with pain in Bil feet, weakness and dyspnea on exertion impacting mobility. Ambulation distance limited due to pain. Agreeable to transfer to chair. Pt will need 24/7 S at home for safety. Will continue to follow to maximize independence and mobility prior to return home.     Follow Up Recommendations Home health PT;Supervision/Assistance - 24 hour    Equipment Recommendations  None recommended by PT    Recommendations for Other Services       Precautions / Restrictions Precautions Precautions: Fall Restrictions Weight Bearing Restrictions: No      Mobility  Bed Mobility Overal bed mobility: Needs Assistance Bed Mobility: Supine to Sit     Supine to sit: Supervision;HOB elevated     General bed mobility comments: Supervision for safety.   Transfers Overall transfer level: Needs assistance Equipment used: Rolling walker (2 wheeled) Transfers: Sit to/from Stand Sit to Stand: Min assist         General transfer comment: Pt pulling up on RW despite cues for hand placement.    Ambulation/Gait Ambulation/Gait assistance: Min assist Ambulation Distance (Feet): 6 Feet Assistive device: Rolling walker (2 wheeled) Gait Pattern/deviations: Step-through pattern;Decreased stride length;Trunk flexed   Gait velocity interpretation: <1.8 ft/sec, indicative of risk for recurrent falls General Gait Details: Pt with slow, unsteady gait. Dyspnea present. Sa02 90% on RA. Ambulation distance limited due to "sore feet"  Stairs            Wheelchair Mobility    Modified Rankin (Stroke Patients Only)        Balance Overall balance assessment: Needs assistance Sitting-balance support: Feet supported;No upper extremity supported Sitting balance-Leahy Scale: Good Sitting balance - Comments: Total A to donn socks.   Standing balance support: During functional activity Standing balance-Leahy Scale: Poor Standing balance comment: relient on RW for support.                              Pertinent Vitals/Pain Pain Assessment: Faces Faces Pain Scale: Hurts a little bit Pain Location: "my feet" Pain Descriptors / Indicators: Sore Pain Intervention(s): Monitored during session;Limited activity within patient's tolerance;Repositioned    Home Living Family/patient expects to be discharged to:: Private residence Living Arrangements: Spouse/significant other Available Help at Discharge: Family Type of Home: House Home Access: Stairs to enter Entrance Stairs-Rails: Psychiatric nurse of Steps: 3 Home Layout: One level Home Equipment: Environmental consultant - 2 wheels;Cane - single point      Prior Function Level of Independence: Independent with assistive device(s)         Comments: Pt likes to joke so difficult to get accurate info for PLOF/social hx. Reports using RW PTA.      Hand Dominance        Extremity/Trunk Assessment   Upper Extremity Assessment: Defer to OT evaluation           Lower Extremity Assessment: Generalized weakness (Bil unna boots donned.)         Communication   Communication: HOH  Cognition Arousal/Alertness: Awake/alert Behavior During Therapy: WFL for tasks assessed/performed Overall Cognitive Status: No family/caregiver present to determine baseline cognitive  functioning                      General Comments      Exercises Other Exercises Other Exercises: Sit to stand x5      Assessment/Plan    PT Assessment Patient needs continued PT services  PT Diagnosis Difficulty walking;Acute pain;Generalized weakness    PT Problem List Decreased strength;Pain;Cardiopulmonary status limiting activity;Decreased activity tolerance;Decreased balance;Decreased mobility;Decreased safety awareness  PT Treatment Interventions Balance training;Gait training;Functional mobility training;Therapeutic activities;Therapeutic exercise;Patient/family education;Stair training   PT Goals (Current goals can be found in the Care Plan section) Acute Rehab PT Goals Patient Stated Goal: to go home PT Goal Formulation: With patient Time For Goal Achievement: 07/16/15 Potential to Achieve Goals: Good    Frequency Min 3X/week   Barriers to discharge Inaccessible home environment steps to climb to get into home    Co-evaluation               End of Session Equipment Utilized During Treatment: Gait belt Activity Tolerance: Patient limited by pain Patient left: in chair;with call bell/phone within reach Nurse Communication: Mobility status         Time: 4503-8882 PT Time Calculation (min) (ACUTE ONLY): 19 min   Charges:   PT Evaluation $Initial PT Evaluation Tier I: 1 Procedure     PT G Codes:        Kaidan Spengler A Clorissa Gruenberg 07/02/2015, 4:32 PM Wray Kearns, [place], DPT [phone]

## 2015-07-02 NOTE — Progress Notes (Signed)
Pt c/o c/p, EKG done and VSS, O2 on 4L, after EKG done pt stated his pain was subsiding and did not want nitro, Dr. [place] Pink notified, pt stable

## 2015-07-02 NOTE — Consult Note (Signed)
WOC wound consult note Reason for Consult: Consult requested for [facility] boots.  Pt has chronic venous stasis changes and wears Una boots prior to admission which he states are changed twice a week. Wound type: He also has a chronic full thickness wound to plantar heel. Measurement: Right plantar foot near posterior heel 1X1.5X.8cm Wound bed: Red and moist Drainage (amount, consistency, odor) Mod yellow drainage, no odor Periwound: Intact skin surrounding foot wound.  Bilat legs and feet with generalized edema and patchy areas of red partial thickness wounds, none with significant depth or drainage. Dressing procedure/placement/frequency: Continue present plan of care with calcium alginate to right heel to absorb drainage and promote healing and paged ortho tech to apply bilat Una boots and coban and they can change on Tuesday.  Pt can resume previous plan of care for dressing changes and Una boots after discharge. Discussed plan of care with patient and he verbalized understanding. Please re-consult if further assistance is needed.  Thank-you,  Julien Girt MSN, [place], [place], [place], [place]

## 2015-07-02 NOTE — ED Provider Notes (Signed)
CSN: [id number]     Arrival date & time 07/02/15  0222 History  This chart was scribed for  Bruce Hacker, MD by [name], ED Scribe. This patient was seen in room D31C/D31C and the patient's care was started at 2:39 AM.    Chief Complaint  Patient presents with  . Fall  . Shortness of Breath    The history is provided by the patient and the EMS personnel. No language interpreter was used.    Level 5 caveat due to intermittent confusion. Reportedly lives at home with his wife. [name] is a 81 y.o. male who presents to the Emergency Department complaining of a fall witnessed tonight by his wife at home. Per EMS, the pt lost balance. He also has ongoing SOB and does not wear oxygen at home. The pt reports that he lives with his wife and came to the ED because he had increased SOB when being transferred from one location to another. He notes that this happens frequently but tonight the SOB was worse. Patient is disoriented and only oriented to self (unknown baseline).    Patient requiring 2 L nasal cannula. Not on oxygen at home. .   Past Medical History  Diagnosis Date  . Hyperlipidemia   . COPD (chronic obstructive pulmonary disease)   . Leg pain   . GERD (gastroesophageal reflux disease)   . Depression   . Dizziness   . Productive cough   . Thyroid disease   . Peripheral vascular disease   . ED (erectile dysfunction)   . Hypertension     08/17/12 - wife denies  . Walking pneumonia 2000's  . H/O hiatal hernia   . Migraine     "when I was a young boy"  . Arthritis     "hands" (08/28/2014)  . Anxiety   . Hypothyroidism   . Chronic kidney disease (CKD), stage III (moderate)     [name] 08/28/2014   Past Surgical History  Procedure Laterality Date  . Renal artery stent Left   . Kidney stone surgery  1989  . Lower extremity angiogram Left 03-20-2014    Dr. Tinnie [name]  . Tonsillectomy    . Cataract extraction, bilateral Bilateral   . Blepharoplasty  Right   . Abdominal aortagram N/A 03/20/2013    Procedure: ABDOMINAL [name];  Surgeon: Bruce Misty, MD;  Location: [name];  Service: Cardiovascular;  Laterality: N/A;  . Lower extremity angiogram Left 03/20/2013    Procedure: LOWER EXTREMITY ANGIOGRAM;  Surgeon: Bruce Misty, MD;  Location: [name];  Service: Cardiovascular;  Laterality: Left;   Family History  Problem Relation Age of Onset  . Other Mother     AAA  . AAA (abdominal aortic aneurysm) Mother   . Heart attack Father   . Alcohol abuse Father   . Diabetes Father   . Other Father     amputation  . Diabetes Brother   . Coronary artery disease Brother   . Dementia Brother    History  Substance Use Topics  . Smoking status: Former Smoker -- 1.00 packs/day for 25 years    Types: Cigarettes    Quit date: 08/17/2012  . Smokeless tobacco: Never Used  . Alcohol Use: 0.0 oz/week    0 Standard drinks or equivalent per week     Comment: 08/28/2014 "last drink was in ~ 2012"    Review of Systems  Unable to perform ROS: Mental status change  Constitutional: Negative  for fever.  Respiratory: Positive for shortness of breath. Negative for cough.   Musculoskeletal: Positive for back pain and neck pain.  All other systems reviewed and are negative.     Allergies  Review of patient's allergies indicates no known allergies.  Home Medications   Prior to Admission medications   Medication Sig Start Date End Date Taking? Authorizing Provider  acetaminophen (TYLENOL) 325 MG tablet Take 650 mg by mouth every 6 (six) hours as needed. For pain or fever   Yes Historical Provider, MD  albuterol (PROVENTIL) (5 MG/ML) 0.5% nebulizer solution Take 2.5 mg by nebulization every 6 (six) hours as needed. For shortness of breath   Yes Historical Provider, MD  aspirin EC 81 MG tablet Take 81 mg by mouth every morning.    Yes Historical Provider, MD  carbidopa-levodopa (SINEMET) 10-100 MG per tablet Take 1 tablet by mouth every  evening.    Yes Historical Provider, MD  diltiazem (CARDIZEM CD) 360 MG 24 hr capsule Take 1 capsule (360 mg total) by mouth daily. 10/09/14  Yes Bruce Baton, MD  Fluticasone-Salmeterol (ADVAIR) 250-50 MCG/DOSE AEPB Inhale 1-2 puffs into the lungs 2 (two) times daily as needed (for wheezing.).   Yes Historical Provider, MD  gabapentin (NEURONTIN) 300 MG capsule Take 1 capsule (300 mg total) by mouth 2 (two) to 3 (three)  times daily. 09/02/14  Yes Bruce Baton, MD  ipratropium (ATROVENT) 0.02 % nebulizer solution Take 500 mcg by nebulization every 6 (six) hours as needed. For shortness of breath   Yes Historical Provider, MD  levothyroxine (SYNTHROID, LEVOTHROID) 112 MCG tablet Take 112 mcg by mouth every morning.    Yes Historical Provider, MD  linagliptin (TRADJENTA) 5 MG TABS tablet Take 1 tablet (5 mg total) by mouth daily. 10/09/14  Yes Bruce Baton, MD  LORazepam (ATIVAN) 0.5 MG tablet Take 1 tablet by mouth 3 (three) times daily. 06/02/15  Yes Historical Provider, MD  Melatonin-Pyridoxine (MELATIN PO) Take 1 tablet by mouth at bedtime as needed (sleep).   Yes Historical Provider, MD  Oxycodone HCl 10 MG TABS Take 1 tablet by mouth every 4 (four) hours as needed. 06/20/15  Yes Historical Provider, MD  QUEtiapine (SEROQUEL) 25 MG tablet Take 1 tablet (25 mg total) by mouth at bedtime. May take 1-2 during the day as needed. 10/09/14  Yes Bruce Baton, MD  sertraline (ZOLOFT) 100 MG tablet Take 100 mg by mouth every evening.    Yes Historical Provider, MD  simvastatin (ZOCOR) 40 MG tablet Take 40 mg by mouth at bedtime.    Yes Historical Provider, MD  feeding supplement (ENSURE) PUDG Take 1 Container by mouth 3 (three) times daily between meals. 08/23/12   Bruce Baton, MD   BP 118/55 mmHg  Pulse 72  Temp(Src) 98 F (36.7 C) (Oral)  Resp 20  Wt 226 lb 3.1 oz (102.601 kg)  SpO2 94% Physical Exam  Constitutional: No distress.  Chronically ill-appearing, no acute distress, requiring 2 L nasal cannula   HENT:  Head: Normocephalic and atraumatic.  Eyes: Pupils are equal, round, and reactive to light.  Cardiovascular: Normal rate, regular rhythm and normal heart sounds.   No murmur heard. Pulmonary/Chest: Effort normal. No respiratory distress. He has no wheezes. He has rales.  Abdominal: Soft. Bowel sounds are normal. There is no tenderness. There is no rebound.  Musculoskeletal: He exhibits edema.  2+ bilateral lower extremity edema, bandages as noted below the knees  Neurological: He is alert.  Oriented only to  self, moves all 4 extremities  Skin: Skin is warm and dry.  Psychiatric: He has a normal mood and affect.  Nursing note and vitals reviewed.   ED Course  Procedures   DIAGNOSTIC STUDIES: Oxygen Saturation is 88% on RA and 92% on 2 L [place].  COORDINATION OF CARE: 2:45 AM Discussed treatment plan which includes XRs of the pelvis, chest, and lumbar spine, CT scans of the head and cervical spine, and lab work with pt at bedside and pt agreed to plan.  Labs Review Labs Reviewed  BASIC METABOLIC PANEL - Abnormal; Notable for the following:    CO2 20 (*)    Glucose, Bld 171 (*)    BUN 25 (*)    Creatinine, Ser 2.11 (*)    Calcium 8.5 (*)    GFR calc non Af Amer 27 (*)    GFR calc Af Amer 31 (*)    All other components within normal limits  CBC - Abnormal; Notable for the following:    WBC 14.3 (*)    Platelets 124 (*)    All other components within normal limits  BRAIN NATRIURETIC PEPTIDE - Abnormal; Notable for the following:    B Natriuretic Peptide 181.6 (*)    All other components within normal limits  URINALYSIS, ROUTINE W REFLEX MICROSCOPIC (NOT AT [name]) - Abnormal; Notable for the following:    Color, Urine AMBER (*)    APPearance CLOUDY (*)    Bilirubin Urine SMALL (*)    Ketones, ur 15 (*)    Protein, ur 30 (*)    All other components within normal limits  URINE MICROSCOPIC-ADD ON - Abnormal; Notable for the following:    Bacteria, UA FEW (*)    Casts  HYALINE CASTS (*)    All other components within normal limits  CBG MONITORING, ED - Abnormal; Notable for the following:    Glucose-Capillary 158 (*)    All other components within normal limits  CULTURE, BLOOD (ROUTINE X 2)  CULTURE, BLOOD (ROUTINE X 2)  URINE CULTURE  I-STAT TROPOININ, ED    Imaging Review Dg Chest 2 View  07/02/2015   CLINICAL DATA:  Shortness of breath  EXAM: CHEST  2 VIEW  COMPARISON:  10/09/2014  FINDINGS: Cardiac enlargement. Pulmonary vascular congestion. Interstitial edema in the lung bases. No blunting of costophrenic angles. No pneumothorax. Calcified and tortuous aorta with ectasia. Degenerative changes in the spine.  IMPRESSION: Cardiac enlargement with mild pulmonary vascular congestion and basilar edema.   Electronically Signed   By: Lucienne Capers M.D.   On: 07/02/2015 03:22   Dg Lumbar Spine Complete  07/02/2015   CLINICAL DATA:  Shortness of breath tonight. [employer]. Low back pain. Pain both hips.  EXAM: LUMBAR SPINE - COMPLETE 4+ VIEW  COMPARISON:  None.  FINDINGS: Normal alignment of the lumbar spine. Degenerative changes throughout with bridging anterior osteophytes. No vertebral compression deformities. No focal bone lesion or bone destruction. Bone cortex and trabecular architecture appear intact. Vascular calcifications.  IMPRESSION: Degenerative changes in the lumbar spine. Normal alignment. No acute fractures identified.   Electronically Signed   By: Lucienne Capers M.D.   On: 07/02/2015 03:24   Dg Pelvis 1-2 Views  07/02/2015   CLINICAL DATA:  Shortness of breath tonight. Fall tonight. Low back pain. Pain both hips.  EXAM: PELVIS - 1-2 VIEW  COMPARISON:  None.  FINDINGS: There is no evidence of pelvic fracture or diastasis. No pelvic bone lesions are seen. Degenerative changes in  both hips. Surgical clips in the pelvis. Vascular calcifications.  IMPRESSION: No acute bony abnormalities.   Electronically Signed   By: Lucienne Capers M.D.   On:  07/02/2015 03:22   Ct Head Wo Contrast  07/02/2015   CLINICAL DATA:  Initial evaluation for acute trauma, fall.  EXAM: CT HEAD WITHOUT CONTRAST  CT CERVICAL SPINE WITHOUT CONTRAST  TECHNIQUE: Multidetector CT imaging of the head and cervical spine was performed following the standard protocol without intravenous contrast. Multiplanar CT image reconstructions of the cervical spine were also generated.  COMPARISON:  None.  FINDINGS: CT HEAD FINDINGS  Diffuse prominence of the CSF containing spaces is compatible with generalized cerebral atrophy. Patchy and confluent hypodensity within the periventricular and deep white matter both cerebral hemispheres present, most consistent with chronic small vessel ischemic disease. Small remote lacunar infarct present within the bilateral basal ganglia. Prominent vascular calcifications present within the carotid siphons and distal vertebral arteries.  No acute large vessel territory infarct. No intracranial hemorrhage. No mass lesion, midline shift, or mass effect. No extra-axial fluid collection. No hydrocephalus.  Scalp soft tissues within normal limits. No acute abnormality about the orbits.  Calvarium intact.  Chronic mucosal thickening present within the right sphenoid sinus. Mild mucosal thickening within the max O sinuses bilaterally, likely chronic. Paranasal sinuses are otherwise clear. No mastoid effusion.  CT CERVICAL SPINE FINDINGS  Trace anterolisthesis of C6 on C7, likely chronic. Otherwise, vertebral bodies are normally aligned with preservation of the normal cervical lordosis. Vertebral body heights are preserved. Normal C1-2 articulations are intact. No prevertebral soft tissue swelling. No acute fracture or listhesis.  Advanced multilevel degenerative disc disease and facet arthropathy present throughout the cervical spine, most prevalent at C5-6 and C6-7.  No acute soft tissue abnormality. Prominent vascular calcifications present about the carotid  bifurcations.  Visualized lung apices are clear without evidence of apical pneumothorax. Large amount a debris present within the distal esophagus.  IMPRESSION: CT BRAIN:  1. No acute intracranial process. 2. Generalized cerebral atrophy with chronic microvascular ischemic disease and small remote lacunar infarcts within the bilateral basal ganglia.  CT CERVICAL SPINE:  1. No acute traumatic injury within the cervical spine. 2. Large amount of debris within the partially visualized upper esophagus. This patient is likely at risk for aspiration.   Electronically Signed   By: Jeannine Boga M.D.   On: 07/02/2015 05:00   Ct Cervical Spine Wo Contrast  07/02/2015   CLINICAL DATA:  Initial evaluation for acute trauma, fall.  EXAM: CT HEAD WITHOUT CONTRAST  CT CERVICAL SPINE WITHOUT CONTRAST  TECHNIQUE: Multidetector CT imaging of the head and cervical spine was performed following the standard protocol without intravenous contrast. Multiplanar CT image reconstructions of the cervical spine were also generated.  COMPARISON:  None.  FINDINGS: CT HEAD FINDINGS  Diffuse prominence of the CSF containing spaces is compatible with generalized cerebral atrophy. Patchy and confluent hypodensity within the periventricular and deep white matter both cerebral hemispheres present, most consistent with chronic small vessel ischemic disease. Small remote lacunar infarct present within the bilateral basal ganglia. Prominent vascular calcifications present within the carotid siphons and distal vertebral arteries.  No acute large vessel territory infarct. No intracranial hemorrhage. No mass lesion, midline shift, or mass effect. No extra-axial fluid collection. No hydrocephalus.  Scalp soft tissues within normal limits. No acute abnormality about the orbits.  Calvarium intact.  Chronic mucosal thickening present within the right sphenoid sinus. Mild mucosal thickening within the max O sinuses  bilaterally, likely chronic.  Paranasal sinuses are otherwise clear. No mastoid effusion.  CT CERVICAL SPINE FINDINGS  Trace anterolisthesis of C6 on C7, likely chronic. Otherwise, vertebral bodies are normally aligned with preservation of the normal cervical lordosis. Vertebral body heights are preserved. Normal C1-2 articulations are intact. No prevertebral soft tissue swelling. No acute fracture or listhesis.  Advanced multilevel degenerative disc disease and facet arthropathy present throughout the cervical spine, most prevalent at C5-6 and C6-7.  No acute soft tissue abnormality. Prominent vascular calcifications present about the carotid bifurcations.  Visualized lung apices are clear without evidence of apical pneumothorax. Large amount a debris present within the distal esophagus.  IMPRESSION: CT BRAIN:  1. No acute intracranial process. 2. Generalized cerebral atrophy with chronic microvascular ischemic disease and small remote lacunar infarcts within the bilateral basal ganglia.  CT CERVICAL SPINE:  1. No acute traumatic injury within the cervical spine. 2. Large amount of debris within the partially visualized upper esophagus. This patient is likely at risk for aspiration.   Electronically Signed   By: Jeannine Boga M.D.   On: 07/02/2015 05:00     EKG Interpretation   Date/Time:  Thursday July 02 2015 02:35:06 EDT Ventricular Rate:  74 PR Interval:    QRS Duration: 159 QT Interval:  400 QTC Calculation: 444 R Axis:   -55 Text Interpretation:  Accelerated junctional rhythm Right bundle branch  block Inferior infarct, old Anterior infarct, old Confirmed by HORTON  MD,  Ingalls ([id number]) on 07/02/2015 3:36:15 AM      MDM   Final diagnoses:  Shortness of breath  Hypoxia  Fall, initial encounter    Patient presents following a fall. Reports increasing shortness of breath. Hypoxic requiring 2 L nasal cannula. History of COPD. Also has extensive lower extremity edema. Currently under bandaging. Afebrile.  Otherwise nontoxic and nonfocal. No obvious signs of trauma.  CT head and neck obtained and x-ray of the chest and lumbar spine and pelvis. Plain films are reassuring. He does have evidence of pulmonary edema. Creatinine is 2.1. Baseline appears to be around 1.5. Patient does have a leukocytosis to 14. He is afebrile. Given constellation of symptoms, hypoxia, leukocytosis, will cover with antibiotics for community-acquired pneumonia. He is supposed to take torsemide when necessary for swelling but does not like to take this. Given his kidney function and that he is in no acute respiratory distress, will defer to the admitting doctor and primary team for gentle diuresis. Will admit given hypoxia.  I personally performed the services described in this documentation, which was scribed in my presence. The recorded information has been reviewed and is accurate.      Bruce Hacker, MD 07/02/15 [phone]

## 2015-07-02 NOTE — Progress Notes (Signed)
Orthopedic Tech Progress Note Patient Details:  [name] [birth year] [id number]  Ortho Devices Type of Ortho Device: Louretta Parma boot Ortho Device/Splint Location: bilateral Ortho Device/Splint Interventions: Application   Tatiana Courter 07/02/2015, 10:06 AM

## 2015-07-02 NOTE — Progress Notes (Signed)
[facility] PROGRESS NOTE  [name] MVH:[id number] DOB: [birth year] DOA: 07/02/2015  PCP: [name], MD  Brief HPI: 81 year old Caucasian male with a past medical history of COPD, peripheral vascular disease, chronic lower extremity venous stasis with chronic heel wound, chronic kidney disease stage III. He presented due to fall at home along with shortness of breath. He does not use oxygen at home. Is supposed to be taking torsemide as needed for leg swelling, but has not taken it recently. Chest x-ray revealed pulmonary edema. He was hospitalized for further management.  Past medical history:  Past Medical History  Diagnosis Date  . Hyperlipidemia   . COPD (chronic obstructive pulmonary disease)   . Leg pain   . GERD (gastroesophageal reflux disease)   . Depression   . Dizziness   . Productive cough   . Thyroid disease   . Peripheral vascular disease   . ED (erectile dysfunction)   . Hypertension     08/17/12 - wife denies  . Walking pneumonia 2000's  . H/O hiatal hernia   . Migraine     "when I was a young boy"  . Arthritis     "hands" (08/28/2014)  . Anxiety   . Hypothyroidism   . Chronic kidney disease (CKD), stage III (moderate)     [name] 08/28/2014    Consultants: None  Procedures:  2-D echocardiogram is pending  Antibiotics: Was on amoxicillin at home.  Subjective: Patient feels better this morning. Breathing is improved. Denies any chest pain. No nausea, vomiting. No cough.  Objective: Vital Signs  Filed Vitals:   07/02/15 0504 07/02/15 0601 07/02/15 0640 07/02/15 0706  BP: 102/65 118/55  112/57  Pulse: 76 72  69  Temp:    98.2 F (36.8 C)  TempSrc:    Oral  Resp: 22 20  18   Height:   6' (1.829 m)   Weight:   97.796 kg (215 lb 9.6 oz)   SpO2: 94% 94%  96%   No intake or output data in the 24 hours ending 07/02/15 1229 Filed Weights   07/02/15 0244 07/02/15 0640  Weight: 102.601 kg (226 lb 3.1 oz) 97.796 kg (215 lb 9.6 oz)     General appearance: alert, cooperative and no distress Head: Normocephalic, without obvious abnormality, atraumatic Resp: Diminished air entry at the bases with crackles bilaterally. No wheezing. No rhonchi. Cardio: S1S2 is normal, regular. Systolic murmur appreciated over the precordium. No S3, S4. No rubs, or bruit. About 2-3+ pitting edema bilateral lower extremities. GI: soft, non-tender; bowel sounds normal; no masses,  no organomegaly Extremities: Most of the legs covered by [employer]. Neurologic: No focal deficits.  Lab Results:  Basic Metabolic Panel:  Recent Labs Lab 07/02/15 0250  NA 136  K 4.8  CL 103  CO2 20*  GLUCOSE 171*  BUN 25*  CREATININE 2.11*  CALCIUM 8.5*   CBC:  Recent Labs Lab 07/02/15 0250  WBC 14.3*  HGB 13.2  HCT 40.0  MCV 82.3  PLT 124*   Cardiac Enzymes:  Recent Labs Lab 07/02/15 0912  TROPONINI 0.03   BNP (last 3 results)  Recent Labs  07/02/15 0250  BNP 181.6*   CBG:  Recent Labs Lab 07/02/15 0607 07/02/15 0652 07/02/15 1027  GLUCAP 158* 161* 110*      Studies/Results: Dg Chest 2 View  07/02/2015   CLINICAL DATA:  Shortness of breath  EXAM: CHEST  2 VIEW  COMPARISON:  10/09/2014  FINDINGS: Cardiac enlargement. Pulmonary vascular  congestion. Interstitial edema in the lung bases. No blunting of costophrenic angles. No pneumothorax. Calcified and tortuous aorta with ectasia. Degenerative changes in the spine.  IMPRESSION: Cardiac enlargement with mild pulmonary vascular congestion and basilar edema.   Electronically Signed   By: Lucienne Capers M.D.   On: 07/02/2015 03:22   Dg Lumbar Spine Complete  07/02/2015   CLINICAL DATA:  Shortness of breath tonight. [employer]. Low back pain. Pain both hips.  EXAM: LUMBAR SPINE - COMPLETE 4+ VIEW  COMPARISON:  None.  FINDINGS: Normal alignment of the lumbar spine. Degenerative changes throughout with bridging anterior osteophytes. No vertebral compression deformities. No focal  bone lesion or bone destruction. Bone cortex and trabecular architecture appear intact. Vascular calcifications.  IMPRESSION: Degenerative changes in the lumbar spine. Normal alignment. No acute fractures identified.   Electronically Signed   By: Lucienne Capers M.D.   On: 07/02/2015 03:24   Dg Pelvis 1-2 Views  07/02/2015   CLINICAL DATA:  Shortness of breath tonight. Fall tonight. Low back pain. Pain both hips.  EXAM: PELVIS - 1-2 VIEW  COMPARISON:  None.  FINDINGS: There is no evidence of pelvic fracture or diastasis. No pelvic bone lesions are seen. Degenerative changes in both hips. Surgical clips in the pelvis. Vascular calcifications.  IMPRESSION: No acute bony abnormalities.   Electronically Signed   By: Lucienne Capers M.D.   On: 07/02/2015 03:22   Ct Head Wo Contrast  07/02/2015   CLINICAL DATA:  Initial evaluation for acute trauma, fall.  EXAM: CT HEAD WITHOUT CONTRAST  CT CERVICAL SPINE WITHOUT CONTRAST  TECHNIQUE: Multidetector CT imaging of the head and cervical spine was performed following the standard protocol without intravenous contrast. Multiplanar CT image reconstructions of the cervical spine were also generated.  COMPARISON:  None.  FINDINGS: CT HEAD FINDINGS  Diffuse prominence of the CSF containing spaces is compatible with generalized cerebral atrophy. Patchy and confluent hypodensity within the periventricular and deep white matter both cerebral hemispheres present, most consistent with chronic small vessel ischemic disease. Small remote lacunar infarct present within the bilateral basal ganglia. Prominent vascular calcifications present within the carotid siphons and distal vertebral arteries.  No acute large vessel territory infarct. No intracranial hemorrhage. No mass lesion, midline shift, or mass effect. No extra-axial fluid collection. No hydrocephalus.  Scalp soft tissues within normal limits. No acute abnormality about the orbits.  Calvarium intact.  Chronic mucosal  thickening present within the right sphenoid sinus. Mild mucosal thickening within the max O sinuses bilaterally, likely chronic. Paranasal sinuses are otherwise clear. No mastoid effusion.  CT CERVICAL SPINE FINDINGS  Trace anterolisthesis of C6 on C7, likely chronic. Otherwise, vertebral bodies are normally aligned with preservation of the normal cervical lordosis. Vertebral body heights are preserved. Normal C1-2 articulations are intact. No prevertebral soft tissue swelling. No acute fracture or listhesis.  Advanced multilevel degenerative disc disease and facet arthropathy present throughout the cervical spine, most prevalent at C5-6 and C6-7.  No acute soft tissue abnormality. Prominent vascular calcifications present about the carotid bifurcations.  Visualized lung apices are clear without evidence of apical pneumothorax. Large amount a debris present within the distal esophagus.  IMPRESSION: CT BRAIN:  1. No acute intracranial process. 2. Generalized cerebral atrophy with chronic microvascular ischemic disease and small remote lacunar infarcts within the bilateral basal ganglia.  CT CERVICAL SPINE:  1. No acute traumatic injury within the cervical spine. 2. Large amount of debris within the partially visualized upper esophagus. This patient is  likely at risk for aspiration.   Electronically Signed   By: Jeannine Boga M.D.   On: 07/02/2015 05:00   Ct Cervical Spine Wo Contrast  07/02/2015   CLINICAL DATA:  Initial evaluation for acute trauma, fall.  EXAM: CT HEAD WITHOUT CONTRAST  CT CERVICAL SPINE WITHOUT CONTRAST  TECHNIQUE: Multidetector CT imaging of the head and cervical spine was performed following the standard protocol without intravenous contrast. Multiplanar CT image reconstructions of the cervical spine were also generated.  COMPARISON:  None.  FINDINGS: CT HEAD FINDINGS  Diffuse prominence of the CSF containing spaces is compatible with generalized cerebral atrophy. Patchy and confluent  hypodensity within the periventricular and deep white matter both cerebral hemispheres present, most consistent with chronic small vessel ischemic disease. Small remote lacunar infarct present within the bilateral basal ganglia. Prominent vascular calcifications present within the carotid siphons and distal vertebral arteries.  No acute large vessel territory infarct. No intracranial hemorrhage. No mass lesion, midline shift, or mass effect. No extra-axial fluid collection. No hydrocephalus.  Scalp soft tissues within normal limits. No acute abnormality about the orbits.  Calvarium intact.  Chronic mucosal thickening present within the right sphenoid sinus. Mild mucosal thickening within the max O sinuses bilaterally, likely chronic. Paranasal sinuses are otherwise clear. No mastoid effusion.  CT CERVICAL SPINE FINDINGS  Trace anterolisthesis of C6 on C7, likely chronic. Otherwise, vertebral bodies are normally aligned with preservation of the normal cervical lordosis. Vertebral body heights are preserved. Normal C1-2 articulations are intact. No prevertebral soft tissue swelling. No acute fracture or listhesis.  Advanced multilevel degenerative disc disease and facet arthropathy present throughout the cervical spine, most prevalent at C5-6 and C6-7.  No acute soft tissue abnormality. Prominent vascular calcifications present about the carotid bifurcations.  Visualized lung apices are clear without evidence of apical pneumothorax. Large amount a debris present within the distal esophagus.  IMPRESSION: CT BRAIN:  1. No acute intracranial process. 2. Generalized cerebral atrophy with chronic microvascular ischemic disease and small remote lacunar infarcts within the bilateral basal ganglia.  CT CERVICAL SPINE:  1. No acute traumatic injury within the cervical spine. 2. Large amount of debris within the partially visualized upper esophagus. This patient is likely at risk for aspiration.   Electronically Signed   By:  Jeannine Boga M.D.   On: 07/02/2015 05:00    Medications:  Scheduled: . aspirin EC  81 mg Oral q morning - 10a  . atorvastatin  20 mg Oral QHS  . carbidopa-levodopa  1 tablet Oral QPM  . diltiazem  360 mg Oral Daily  . furosemide  80 mg Intravenous Once  . gabapentin  300 mg Oral BID  . heparin  5,000 Units Subcutaneous 3 times per day  . insulin aspart  0-9 Units Subcutaneous TID WC  . levothyroxine  112 mcg Oral QAC breakfast  . LORazepam  0.5 mg Oral TID  . mometasone-formoterol  2 puff Inhalation BID  . QUEtiapine  25 mg Oral QHS  . sertraline  100 mg Oral QPM  . sodium chloride  3 mL Intravenous Q12H   Continuous:  OZH:YQMVHQ chloride, acetaminophen, albuterol, ipratropium, ondansetron (ZOFRAN) IV, oxyCODONE, sodium chloride  Assessment/Plan:  Principal Problem:   Acute respiratory failure with hypoxia Active Problems:   ESOPHAGEAL MOTILITY DISORDER   Atherosclerosis of native arteries of the extremities with ulceration   Moderate aortic stenosis   Pulmonary edema   Leukocytosis   DM2 (diabetes mellitus, type 2)    Acute respiratory failure  with hypoxia CXR showed basilar pulmonary edema and pulmonary vascular congestion.Concern for diastolic heart failure. Echocardiogram is pending. He was noted to have normal systolic function in 6333. Continue with IV Lasix. He does have significant lower extremity edema, though most of it is most likely due to his chronic venous stasis. No ACEi ordered due to kidney function.  Leukocytosis/history of osteomyelitis right calcaneum He is afebrile. No obvious source of infection. He does ever chronic wound in his right heel. He takes amoxicillin chronically for suppression. He has been seen by infectious disease in the past. We will resume this medication.  CKD stage III Creatinine up to 2.0 from 1.5 baseline. Continue to monitor urine output. Repeat in the morning. It continues to worsen, we may need to do a renal  ultrasound. Close monitoring with diuresis  BLE wounds wound care consult  DM2 Holding home tradjenda. Sliding scale insulin coverage.  Moderate aortic stenosis Appears to be stable. Will follow up echocardiogram.  History of essential hypertension Monitor blood pressures closely. Continue medications.  Esophageal dysmotility disorder With debris in esophagus on CT neck. Liquid diet for now. SLP consult  DVT Prophylaxis: Subcutaneous heparin    Code Status: Full code  Family Communication: Discussed with the patient  Disposition Plan: Continue current treatment. PT and OT evaluation.  Follow-up Appointment?: Will need to follow-up with his primary care physician one week after discharge.   LOS: 0 days   [facility] Pager [phone] 07/02/2015, 12:29 PM  If 7PM-7AM, please contact night-coverage at [email or web address], password [facility]

## 2015-07-02 NOTE — ED Notes (Signed)
Pt to ED from home via [facility] c/o fall and shortness of breath. Pt reports a witnessed fall by wife after losing his balance. Pt with bilateral bandages to legs for "poor circulation." Pt also reports shortness of breath that has been ongoing. Room air sats 88% on arrival. Pt c/o neck and back pain. Per EMS, pt recently started on Cardizem

## 2015-07-02 NOTE — Progress Notes (Signed)
UR COMPLETED  

## 2015-07-02 NOTE — Progress Notes (Signed)
Initial Nutrition Assessment  INTERVENTION:   Glucerna Shake po BID, each supplement provides 220 kcal and 10 grams of protein Provide 30 ml pro-stat BID, each dose provides 100 kcal and 15 grams of protein Provide Multivitamin with minerals daily   NUTRITION DIAGNOSIS:   Predicted suboptimal nutrient intake related to lethargy/confusion as evidenced by meal completion < 50%.   GOAL:   Patient will meet greater than or equal to 90% of their needs   MONITOR:   PO intake, Supplement acceptance, Labs, Skin, I & O's  REASON FOR ASSESSMENT:   Malnutrition Screening Tool    ASSESSMENT:   81 y.o. male who presents to the ED from NH. He presents due to fall and SOB.   Pt lethargic/confused at time of visit. He denies any weight loss and reports that he eats when her wants but, he is unable to provide any further details. RN reports that pt was too lethargic this morning to eat- suspects pt will not eat well during admission. Weight history shows that pt has lost 19 lbs in the past 7 months- 8% wt loss. Pt appear fairly well-nourished.   Labs: elevated glucose, low calcium, low GFR  Diet Order:  Diet full liquid Room service appropriate?: Yes; Fluid consistency:: Thin  Skin:  Wound (see comment) (Right chronic full thickness wound to plantar heel.)  Last BM:  PTA  Height:   Ht Readings from Last 1 Encounters:  07/02/15 6' (1.829 m)    Weight:   Wt Readings from Last 1 Encounters:  07/02/15 215 lb 9.6 oz (97.796 kg)    Ideal Body Weight:  80.9 kg  Wt Readings from Last 10 Encounters:  07/02/15 215 lb 9.6 oz (97.796 kg)  11/25/14 234 lb (106.142 kg)  10/28/14 243 lb 4.8 oz (110.36 kg)  10/09/14 233 lb 4.8 oz (105.824 kg)  09/09/14 238 lb (107.956 kg)  09/02/14 242 lb 4.6 oz (109.9 kg)  07/29/14 244 lb (110.678 kg)  04/22/14 241 lb 4.8 oz (109.453 kg)  03/20/13 230 lb (104.327 kg)  02/05/13 234 lb (106.142 kg)    BMI:  Body mass index is 29.23  kg/(m^2).  Estimated Nutritional Needs:   Kcal:  1900-2100  Protein:  100-115 grams  Fluid:  2.6 L/day  EDUCATION NEEDS:   No education needs identified at this time  Pryor Ochoa RD, LDN Inpatient Clinical Dietitian Pager: [phone] After Hours Pager: [phone]

## 2015-07-02 NOTE — ED Notes (Signed)
CBG 158  

## 2015-07-02 NOTE — H&P (Signed)
[facility] History and Physical  [name] JJH:[id number] DOB: 28-Aug-1929 DOA: 07/02/2015  Referring physician: EDP PCP: Bruce Reel, MD   Chief Complaint: SOB   HPI: [name] is a 81 y.o. male who presents to the ED from NH.  He presents due to fall and SOB.  Thankfully uninjured in the fall, patient has been having more SOB than baseline recently.  SOB seems worse with activity, better at rest.  Frequently has SOB with activity but tonight it is worse.  Improves with O2 via [place].  Not normally on O2 at baseline.  Did have productive cough earlier but none now.  Is supposed to be taking torsemide PRN swelling in his legs but he dosent "because it makes my left kidney hurt".  Review of Systems: Systems reviewed.  As above, otherwise negative  Past Medical History  Diagnosis Date  . Hyperlipidemia   . COPD (chronic obstructive pulmonary disease)   . Leg pain   . GERD (gastroesophageal reflux disease)   . Depression   . Dizziness   . Productive cough   . Thyroid disease   . Peripheral vascular disease   . ED (erectile dysfunction)   . Hypertension     08/17/12 - wife denies  . Walking pneumonia 2000's  . H/O hiatal hernia   . Migraine     "when I was a young boy"  . Arthritis     "hands" (08/28/2014)  . Anxiety   . Hypothyroidism   . Chronic kidney disease (CKD), stage III (moderate)     [name] 08/28/2014   Past Surgical History  Procedure Laterality Date  . Renal artery stent Left   . Kidney stone surgery  1989  . Lower extremity angiogram Left 03-20-2014    Dr. Tinnie Gens  . Tonsillectomy    . Cataract extraction, bilateral Bilateral   . Blepharoplasty Right   . Abdominal aortagram N/A 03/20/2013    Procedure: ABDOMINAL Maxcine Ham;  Surgeon: [name], MD;  Location: [facility] CATH LAB;  Service: Cardiovascular;  Laterality: N/A;  . Lower extremity angiogram Left 03/20/2013    Procedure: LOWER EXTREMITY ANGIOGRAM;  Surgeon: [name], MD;   Location: [facility] CATH LAB;  Service: Cardiovascular;  Laterality: Left;   Social History:  reports that he quit smoking about 2 years ago. His smoking use included Cigarettes. He has a 25 pack-year smoking history. He has never used smokeless tobacco. He reports that he drinks alcohol. He reports that he does not use illicit drugs.  No Known Allergies  Family History  Problem Relation Age of Onset  . Other Mother     AAA  . AAA (abdominal aortic aneurysm) Mother   . Heart attack Father   . Alcohol abuse Father   . Diabetes Father   . Other Father     amputation  . Diabetes Brother   . Coronary artery disease Brother   . Dementia Brother      Prior to Admission medications   Medication Sig Start Date End Date Taking? Authorizing Provider  acetaminophen (TYLENOL) 325 MG tablet Take 650 mg by mouth every 6 (six) hours as needed. For pain or fever   Yes Historical Provider, MD  albuterol (PROVENTIL) (5 MG/ML) 0.5% nebulizer solution Take 2.5 mg by nebulization every 6 (six) hours as needed. For shortness of breath   Yes Historical Provider, MD  aspirin EC 81 MG tablet Take 81 mg by mouth every morning.    Yes Historical Provider, MD  carbidopa-levodopa (SINEMET) 10-100 MG per tablet Take 1 tablet by mouth every evening.    Yes Historical Provider, MD  diltiazem (CARDIZEM CD) 360 MG 24 hr capsule Take 1 capsule (360 mg total) by mouth daily. 10/09/14  Yes Shon Baton, MD  Fluticasone-Salmeterol (ADVAIR) 250-50 MCG/DOSE AEPB Inhale 1-2 puffs into the lungs 2 (two) times daily as needed (for wheezing.).   Yes Historical Provider, MD  gabapentin (NEURONTIN) 300 MG capsule Take 1 capsule (300 mg total) by mouth 2 (two) to 3 (three)  times daily. 09/02/14  Yes Shon Baton, MD  ipratropium (ATROVENT) 0.02 % nebulizer solution Take 500 mcg by nebulization every 6 (six) hours as needed. For shortness of breath   Yes Historical Provider, MD  levothyroxine (SYNTHROID, LEVOTHROID) 112 MCG tablet Take 112 mcg  by mouth every morning.    Yes Historical Provider, MD  linagliptin (TRADJENTA) 5 MG TABS tablet Take 1 tablet (5 mg total) by mouth daily. 10/09/14  Yes Shon Baton, MD  LORazepam (ATIVAN) 0.5 MG tablet Take 1 tablet by mouth 3 (three) times daily. 06/02/15  Yes Historical Provider, MD  Melatonin-Pyridoxine (MELATIN PO) Take 1 tablet by mouth at bedtime as needed (sleep).   Yes Historical Provider, MD  Oxycodone HCl 10 MG TABS Take 1 tablet by mouth every 4 (four) hours as needed. 06/20/15  Yes Historical Provider, MD  QUEtiapine (SEROQUEL) 25 MG tablet Take 1 tablet (25 mg total) by mouth at bedtime. May take 1-2 during the day as needed. 10/09/14  Yes Shon Baton, MD  sertraline (ZOLOFT) 100 MG tablet Take 100 mg by mouth every evening.    Yes Historical Provider, MD  simvastatin (ZOCOR) 40 MG tablet Take 40 mg by mouth at bedtime.    Yes Historical Provider, MD  feeding supplement (ENSURE) PUDG Take 1 Container by mouth 3 (three) times daily between meals. 08/23/12   Shon Baton, MD   Physical Exam: Filed Vitals:   07/02/15 0601  BP: 118/55  Pulse: 72  Temp:   Resp: 20    BP 118/55 mmHg  Pulse 72  Temp(Src) 98 F (36.7 C) (Oral)  Resp 20  Wt 102.601 kg (226 lb 3.1 oz)  SpO2 94%  General Appearance:    Alert, oriented, no distress, appears stated age  Head:    Normocephalic, atraumatic  Eyes:    PERRL, EOMI, sclera non-icteric        Nose:   Nares without drainage or epistaxis. Mucosa, turbinates normal  Throat:   Moist mucous membranes. Oropharynx without erythema or exudate.  Neck:   Supple. No carotid bruits.  No thyromegaly.  No lymphadenopathy.   Back:     No CVA tenderness, no spinal tenderness  Lungs:     Clear to auscultation bilaterally, without wheezes, rhonchi or rales  Chest wall:    No tenderness to palpitation  Heart:    Regular rate and rhythm without murmurs, gallops, rubs  Abdomen:     Soft, non-tender, nondistended, normal bowel sounds, no organomegaly  Genitalia:     deferred  Rectal:    deferred  Extremities:   4+ pitting edema BLE, BLE are under wrappings for chronic ulcerations.  Pulses:   2+ and symmetric all extremities  Skin:   Skin color, texture, turgor normal, no rashes or lesions  Lymph nodes:   Cervical, supraclavicular, and axillary nodes normal  Neurologic:   CNII-XII intact. Normal strength, sensation and reflexes      throughout    Labs on Admission:  Basic Metabolic Panel:  Recent Labs Lab 07/02/15 0250  NA 136  K 4.8  CL 103  CO2 20*  GLUCOSE 171*  BUN 25*  CREATININE 2.11*  CALCIUM 8.5*   Liver Function Tests: No results for input(s): AST, ALT, ALKPHOS, BILITOT, PROT, ALBUMIN in the last 168 hours. No results for input(s): LIPASE, AMYLASE in the last 168 hours. No results for input(s): AMMONIA in the last 168 hours. CBC:  Recent Labs Lab 07/02/15 0250  WBC 14.3*  HGB 13.2  HCT 40.0  MCV 82.3  PLT 124*   Cardiac Enzymes: No results for input(s): CKTOTAL, CKMB, CKMBINDEX, TROPONINI in the last 168 hours.  BNP (last 3 results)  Recent Labs  10/05/14 1555  PROBNP 5498.0*   CBG: No results for input(s): GLUCAP in the last 168 hours.  Radiological Exams on Admission: Dg Chest 2 View  07/02/2015   CLINICAL DATA:  Shortness of breath  EXAM: CHEST  2 VIEW  COMPARISON:  10/09/2014  FINDINGS: Cardiac enlargement. Pulmonary vascular congestion. Interstitial edema in the lung bases. No blunting of costophrenic angles. No pneumothorax. Calcified and tortuous aorta with ectasia. Degenerative changes in the spine.  IMPRESSION: Cardiac enlargement with mild pulmonary vascular congestion and basilar edema.   Electronically Signed   By: Lucienne Capers M.D.   On: 07/02/2015 03:22   Dg Lumbar Spine Complete  07/02/2015   CLINICAL DATA:  Shortness of breath tonight. [employer]. Low back pain. Pain both hips.  EXAM: LUMBAR SPINE - COMPLETE 4+ VIEW  COMPARISON:  None.  FINDINGS: Normal alignment of the lumbar spine.  Degenerative changes throughout with bridging anterior osteophytes. No vertebral compression deformities. No focal bone lesion or bone destruction. Bone cortex and trabecular architecture appear intact. Vascular calcifications.  IMPRESSION: Degenerative changes in the lumbar spine. Normal alignment. No acute fractures identified.   Electronically Signed   By: Lucienne Capers M.D.   On: 07/02/2015 03:24   Dg Pelvis 1-2 Views  07/02/2015   CLINICAL DATA:  Shortness of breath tonight. Fall tonight. Low back pain. Pain both hips.  EXAM: PELVIS - 1-2 VIEW  COMPARISON:  None.  FINDINGS: There is no evidence of pelvic fracture or diastasis. No pelvic bone lesions are seen. Degenerative changes in both hips. Surgical clips in the pelvis. Vascular calcifications.  IMPRESSION: No acute bony abnormalities.   Electronically Signed   By: Lucienne Capers M.D.   On: 07/02/2015 03:22   Ct Head Wo Contrast  07/02/2015   CLINICAL DATA:  Initial evaluation for acute trauma, fall.  EXAM: CT HEAD WITHOUT CONTRAST  CT CERVICAL SPINE WITHOUT CONTRAST  TECHNIQUE: Multidetector CT imaging of the head and cervical spine was performed following the standard protocol without intravenous contrast. Multiplanar CT image reconstructions of the cervical spine were also generated.  COMPARISON:  None.  FINDINGS: CT HEAD FINDINGS  Diffuse prominence of the CSF containing spaces is compatible with generalized cerebral atrophy. Patchy and confluent hypodensity within the periventricular and deep white matter both cerebral hemispheres present, most consistent with chronic small vessel ischemic disease. Small remote lacunar infarct present within the bilateral basal ganglia. Prominent vascular calcifications present within the carotid siphons and distal vertebral arteries.  No acute large vessel territory infarct. No intracranial hemorrhage. No mass lesion, midline shift, or mass effect. No extra-axial fluid collection. No hydrocephalus.  Scalp  soft tissues within normal limits. No acute abnormality about the orbits.  Calvarium intact.  Chronic mucosal thickening present within the right sphenoid sinus. Mild mucosal  thickening within the max O sinuses bilaterally, likely chronic. Paranasal sinuses are otherwise clear. No mastoid effusion.  CT CERVICAL SPINE FINDINGS  Trace anterolisthesis of C6 on C7, likely chronic. Otherwise, vertebral bodies are normally aligned with preservation of the normal cervical lordosis. Vertebral body heights are preserved. Normal C1-2 articulations are intact. No prevertebral soft tissue swelling. No acute fracture or listhesis.  Advanced multilevel degenerative disc disease and facet arthropathy present throughout the cervical spine, most prevalent at C5-6 and C6-7.  No acute soft tissue abnormality. Prominent vascular calcifications present about the carotid bifurcations.  Visualized lung apices are clear without evidence of apical pneumothorax. Large amount a debris present within the distal esophagus.  IMPRESSION: CT BRAIN:  1. No acute intracranial process. 2. Generalized cerebral atrophy with chronic microvascular ischemic disease and small remote lacunar infarcts within the bilateral basal ganglia.  CT CERVICAL SPINE:  1. No acute traumatic injury within the cervical spine. 2. Large amount of debris within the partially visualized upper esophagus. This patient is likely at risk for aspiration.   Electronically Signed   By: Jeannine Boga M.D.   On: 07/02/2015 05:00   Ct Cervical Spine Wo Contrast  07/02/2015   CLINICAL DATA:  Initial evaluation for acute trauma, fall.  EXAM: CT HEAD WITHOUT CONTRAST  CT CERVICAL SPINE WITHOUT CONTRAST  TECHNIQUE: Multidetector CT imaging of the head and cervical spine was performed following the standard protocol without intravenous contrast. Multiplanar CT image reconstructions of the cervical spine were also generated.  COMPARISON:  None.  FINDINGS: CT HEAD FINDINGS  Diffuse  prominence of the CSF containing spaces is compatible with generalized cerebral atrophy. Patchy and confluent hypodensity within the periventricular and deep white matter both cerebral hemispheres present, most consistent with chronic small vessel ischemic disease. Small remote lacunar infarct present within the bilateral basal ganglia. Prominent vascular calcifications present within the carotid siphons and distal vertebral arteries.  No acute large vessel territory infarct. No intracranial hemorrhage. No mass lesion, midline shift, or mass effect. No extra-axial fluid collection. No hydrocephalus.  Scalp soft tissues within normal limits. No acute abnormality about the orbits.  Calvarium intact.  Chronic mucosal thickening present within the right sphenoid sinus. Mild mucosal thickening within the max O sinuses bilaterally, likely chronic. Paranasal sinuses are otherwise clear. No mastoid effusion.  CT CERVICAL SPINE FINDINGS  Trace anterolisthesis of C6 on C7, likely chronic. Otherwise, vertebral bodies are normally aligned with preservation of the normal cervical lordosis. Vertebral body heights are preserved. Normal C1-2 articulations are intact. No prevertebral soft tissue swelling. No acute fracture or listhesis.  Advanced multilevel degenerative disc disease and facet arthropathy present throughout the cervical spine, most prevalent at C5-6 and C6-7.  No acute soft tissue abnormality. Prominent vascular calcifications present about the carotid bifurcations.  Visualized lung apices are clear without evidence of apical pneumothorax. Large amount a debris present within the distal esophagus.  IMPRESSION: CT BRAIN:  1. No acute intracranial process. 2. Generalized cerebral atrophy with chronic microvascular ischemic disease and small remote lacunar infarcts within the bilateral basal ganglia.  CT CERVICAL SPINE:  1. No acute traumatic injury within the cervical spine. 2. Large amount of debris within the  partially visualized upper esophagus. This patient is likely at risk for aspiration.   Electronically Signed   By: Jeannine Boga M.D.   On: 07/02/2015 05:00    EKG: Independently reviewed.  Assessment/Plan Principal Problem:   Acute respiratory failure with hypoxia Active Problems:   ESOPHAGEAL MOTILITY  DISORDER   Atherosclerosis of native arteries of the extremities with ulceration   Moderate aortic stenosis   Pulmonary edema   Leukocytosis   DM2 (diabetes mellitus, type 2)   1. Acute respiratory failure with hypoxia - CXR shows basilar pulmonary edema and pulmonary vascular congestion.   For now will try and treat as diastolic CHF and see how he responds. 1. Gentle diuresis (ordered one time dose of lasix for now) 2. Do think he has volume to spare for this (4+ pitting edema in BLE) 3. No ACEi ordered due to kidney function. 4. HF pathway 5. 2d echo ordered (previous 2d echo showed moderate aortic stenosis and grade 1 diastolic dysfunction). 2. Leukocytosis - no other SIRS criteria at this point, plan to monitor, got 1 dose of CAP ABx coverage in ED 3. CKD - creatinine up to 2.0 from 1.5 baseline, does not appear to be pre-renal, suspect this is progression of his CKD over the past year. 1. Daily BMP 2. Strict intake and output 3. Close monitoring with diuresis 4. BLE wounds - wound care consult ordered 5. DM2 - 1. Hold home tradjenda 2. Sensitive scale SSI AC/HS 6. Esophageal dysmotility disorder - with debris in esophagus on CT neck 1. Liquid diet for now 2. SLP consult    Code Status: Full Code  Family Communication: No family in room Disposition Plan: Admit to inpatient   Time spent: 72 min  GARDNER, JARED M. [facility] Pager [phone]  If 7AM-7PM, please contact the day team taking care of the patient [email or web address] Password [facility] 07/02/2015, 6:09 AM

## 2015-07-03 ENCOUNTER — Inpatient Hospital Stay (HOSPITAL_COMMUNITY): Payer: Medicare Other

## 2015-07-03 ENCOUNTER — Inpatient Hospital Stay (HOSPITAL_BASED_OUTPATIENT_CLINIC_OR_DEPARTMENT_OTHER): Payer: Medicare Other

## 2015-07-03 DIAGNOSIS — I5031 Acute diastolic (congestive) heart failure: Secondary | ICD-10-CM

## 2015-07-03 DIAGNOSIS — I350 Nonrheumatic aortic (valve) stenosis: Secondary | ICD-10-CM

## 2015-07-03 DIAGNOSIS — E1159 Type 2 diabetes mellitus with other circulatory complications: Secondary | ICD-10-CM

## 2015-07-03 DIAGNOSIS — I5033 Acute on chronic diastolic (congestive) heart failure: Principal | ICD-10-CM

## 2015-07-03 LAB — BASIC METABOLIC PANEL
ANION GAP: 10 (ref 5–15)
BUN: 28 mg/dL — AB (ref 6–20)
CALCIUM: 8.1 mg/dL — AB (ref 8.9–10.3)
CHLORIDE: 98 mmol/L — AB (ref 101–111)
CO2: 26 mmol/L (ref 22–32)
Creatinine, Ser: 1.86 mg/dL — ABNORMAL HIGH (ref 0.61–1.24)
GFR calc Af Amer: 37 mL/min — ABNORMAL LOW (ref >60)
GFR calc non Af Amer: 32 mL/min — ABNORMAL LOW (ref >60)
GLUCOSE: 115 mg/dL — AB (ref 65–99)
POTASSIUM: 3.4 mmol/L — AB (ref 3.5–5.1)
SODIUM: 134 mmol/L — AB (ref 135–145)

## 2015-07-03 LAB — CBC
HCT: 35.6 % — ABNORMAL LOW (ref 39.0–52.0)
HEMOGLOBIN: 11.6 g/dL — AB (ref 13.0–17.0)
MCH: 26.4 pg (ref 26.0–34.0)
MCHC: 32.6 g/dL (ref 30.0–36.0)
MCV: 80.9 fL (ref 78.0–100.0)
PLATELETS: 121 10*3/uL — AB (ref 150–400)
RBC: 4.4 MIL/uL (ref 4.22–5.81)
RDW: 15.4 % (ref 11.5–15.5)
WBC: 10.9 10*3/uL — ABNORMAL HIGH (ref 4.0–10.5)

## 2015-07-03 LAB — GLUCOSE, CAPILLARY
GLUCOSE-CAPILLARY: 101 mg/dL — AB (ref 65–99)
GLUCOSE-CAPILLARY: 105 mg/dL — AB (ref 65–99)
Glucose-Capillary: 140 mg/dL — ABNORMAL HIGH (ref 65–99)
Glucose-Capillary: 110 mg/dL — ABNORMAL HIGH (ref 65–99)
Glucose-Capillary: 147 mg/dL — ABNORMAL HIGH (ref 65–99)

## 2015-07-03 LAB — URINE CULTURE: Culture: NO GROWTH

## 2015-07-03 MED ORDER — POTASSIUM CHLORIDE CRYS ER 20 MEQ PO TBCR
40.0000 meq | EXTENDED_RELEASE_TABLET | Freq: Once | ORAL | Status: AC
  Administered 2015-07-03: 40 meq via ORAL
  Filled 2015-07-03: qty 2

## 2015-07-03 MED ORDER — FUROSEMIDE 10 MG/ML IJ SOLN
80.0000 mg | Freq: Once | INTRAMUSCULAR | Status: AC
  Administered 2015-07-03: 80 mg via INTRAVENOUS
  Filled 2015-07-03: qty 8

## 2015-07-03 MED ORDER — POTASSIUM CHLORIDE CRYS ER 20 MEQ PO TBCR
30.0000 meq | EXTENDED_RELEASE_TABLET | Freq: Once | ORAL | Status: AC
  Administered 2015-07-03: 30 meq via ORAL
  Filled 2015-07-03: qty 1

## 2015-07-03 MED ORDER — POTASSIUM CHLORIDE CRYS ER 10 MEQ PO TBCR: 30.0000 meq | EXTENDED_RELEASE_TABLET | Freq: Two times a day (BID) | ORAL | Status: DC

## 2015-07-03 NOTE — Progress Notes (Signed)
OT Note: Pt's 02 sats noted to drop to 88% on RA with ambulation to bathroom, standing grooming and return to bed. Within 2 minutes, rebounded to 95% 07/03/2015 Nestor Lewandowsky, OTR/L Pager: [phone]

## 2015-07-03 NOTE — Evaluation (Signed)
Occupational Therapy Evaluation Patient Details Name: [name] MRN: [id number] DOB: 1929/06/21 Today's Date: 07/03/2015    History of Present Illness Patient is a 81 y/o male admitted s/p fall at home and presents with SOB. CXR- mild pulmonary vascular congestion and basilar edema. PMH includes COPD, HLD, PVD, HTN, CKD.    Clinical Impression   Prior to admission, pt ambulated with a walker and was assisted for LB bathing and dressing.  He presents with decreased safety awareness and impaired balance interfering with ability to perform self care at his baseline. 02 sats noted to drop to 88% with ambulation to and from bathroom.  Will follow acutely.     Follow Up Recommendations  Home health OT;Supervision/Assistance - 24 hour    Equipment Recommendations       Recommendations for Other Services       Precautions / Restrictions Precautions Precautions: Fall Restrictions Weight Bearing Restrictions: No      Mobility Bed Mobility   Bed Mobility: Supine to Sit     Supine to sit: Supervision;HOB elevated     General bed mobility comments: Supervision for safety.   Transfers Overall transfer level: Needs assistance Equipment used: Rolling walker (2 wheeled) Transfers: Sit to/from Stand Sit to Stand: Min guard         General transfer comment: Pt pulling up on RW despite cues for hand placement.      Balance     Sitting balance-Leahy Scale: Good       Standing balance-Leahy Scale: Poor                              ADL Overall ADL's : Needs assistance/impaired Eating/Feeding: Independent;Sitting   Grooming: Wash/dry hands;Supervision/safety;Standing   Upper Body Bathing: Set up;Sitting   Lower Body Bathing: Moderate assistance;Sit to/from stand   Upper Body Dressing : Set up;Sitting   Lower Body Dressing: Moderate assistance;Sit to/from stand   Toilet Transfer: Ambulation;RW;Min guard   Toileting- Water quality scientist and  Hygiene: Minimal assistance Toileting - Clothing Manipulation Details (indicate cue type and reason): assist to manage gowns     Functional mobility during ADLs: Minimal assistance;Rolling walker General ADL Comments: Pt with dyspnea with ambulation to and from bathroom with desaturation to 88%. RN notified.     Vision     Perception     Praxis      Pertinent Vitals/Pain Pain Assessment: No/denies pain     Hand Dominance Right   Extremity/Trunk Assessment Upper Extremity Assessment Upper Extremity Assessment: Overall WFL for tasks assessed   Lower Extremity Assessment Lower Extremity Assessment: Defer to PT evaluation       Communication Communication Communication: HOH   Cognition Arousal/Alertness: Awake/alert Behavior During Therapy: WFL for tasks assessed/performed Overall Cognitive Status: No family/caregiver present to determine baseline cognitive functioning Area of Impairment: Safety/judgement         Safety/Judgement: Decreased awareness of safety     General Comments: Pt with decreased safety due to urinary urgency with ambulation to bathroom.  Pt attempted to release walker when within 5 feet of bed and walk without it the rest of the way.   General Comments       Exercises       Shoulder Instructions      Home Living Family/patient expects to be discharged to:: Private residence Living Arrangements: Spouse/significant other Available Help at Discharge: Family Type of Home: House Home Access: Stairs to enter   Entrance  Stairs-Rails: Right;Left Home Layout: One level     Bathroom Shower/Tub: Occupational psychologist: Handicapped height     Home Equipment: Environmental consultant - 2 wheels;Cane - single point;Shower seat          Prior Functioning/Environment Level of Independence: Needs assistance  Gait / Transfers Assistance Needed: ambulates with RW ADL's / Homemaking Assistance Needed: wife assists with LB bathing and dressing         OT Diagnosis: Generalized weakness;Cognitive deficits   OT Problem List: Decreased activity tolerance;Impaired balance (sitting and/or standing);Decreased safety awareness;Decreased knowledge of use of DME or AE   OT Treatment/Interventions: Self-care/ADL training;Energy conservation;DME and/or AE instruction;Patient/family education    OT Goals(Current goals can be found in the care plan section) Acute Rehab OT Goals Patient Stated Goal: to go home OT Goal Formulation: With patient Time For Goal Achievement: 07/17/15 Potential to Achieve Goals: Good ADL Goals Pt Will Perform Grooming: standing;with modified independence Pt Will Transfer to Toilet: with modified independence;ambulating;regular height toilet Pt Will Perform Toileting - Clothing Manipulation and hygiene: with modified independence;sit to/from stand Pt Will Perform Tub/Shower Transfer: Shower transfer;with supervision;ambulating;rolling walker Additional ADL Goal #1: Pt will utilize energy conservation strategies in ADL with minimal verbal cues.  OT Frequency: Min 2X/week   Barriers to D/C:            Co-evaluation              End of Session Equipment Utilized During Treatment: Gait belt;Rolling walker Nurse Communication:  (02 sats, ok to give water)  Activity Tolerance: Patient tolerated treatment well Patient left: in bed;with call bell/phone within reach;with bed alarm set   Time: 1430-1458 OT Time Calculation (min): 28 min Charges:  OT General Charges $OT Visit: 1 Procedure OT Evaluation $Initial OT Evaluation Tier I: 1 Procedure OT Treatments $Self Care/Home Management : 8-22 mins G-Codes:    Malka So 07/03/2015, 3:10 PM  [phone]

## 2015-07-03 NOTE — Progress Notes (Signed)
[facility] PROGRESS NOTE  [name] QMV:[id number] DOB: [birth year] DOA: 07/02/2015  PCP: [name], MD  Brief HPI: 81 year old Caucasian male with a past medical history of COPD, peripheral vascular disease, chronic lower extremity venous stasis with chronic heel wound, chronic kidney disease stage III. He presented due to fall at home along with shortness of breath. He does not use oxygen at home. Is supposed to be taking torsemide as needed for leg swelling, but has not taken it recently. Chest x-ray revealed pulmonary edema. He was hospitalized for further management.  Past medical history:  Past Medical History  Diagnosis Date  . Hyperlipidemia   . COPD (chronic obstructive pulmonary disease)   . Leg pain   . GERD (gastroesophageal reflux disease)   . Depression   . Dizziness   . Productive cough   . Thyroid disease   . Peripheral vascular disease   . ED (erectile dysfunction)   . Hypertension     08/17/12 - wife denies  . Walking pneumonia 2000's  . H/O hiatal hernia   . Migraine     "when I was a young boy"  . Arthritis     "hands" (08/28/2014)  . Anxiety   . Hypothyroidism   . Chronic kidney disease (CKD), stage III (moderate)     [name] 08/28/2014    Consultants: None  Procedures:  2-D echocardiogram Study Conclusions - Left ventricle: The cavity size was normal. There was mildconcentric hypertrophy. Systolic function was normal. Theestimated ejection fraction was in the range of 60% to 65%. Thereis akinesis of the basalinferior myocardium. - Aortic valve: Valve mobility was restricted. There was moderateto severe stenosis. Peak velocity (S): 393 cm/s. Mean gradient(S): 34 mm Hg. - Aorta: Ascending aortic diameter: 41 mm (S). - Ascending aorta: The ascending aorta was mildly dilated. - Mitral valve: Calcified annulus. - Left atrium: The atrium was mildly dilated. Impressions: - When compared to prior echocardiogram, aortic stenosis  hasadvanced.  Antibiotics: Was on amoxicillin at home.  Subjective: Patient feels well. States that his breathing is better. He did have some chest pain last night but none this morning. Denies any difficulty swallowing.   Objective: Vital Signs  Filed Vitals:   07/02/15 1820 07/02/15 2121 07/03/15 0124 07/03/15 0626  BP: 129/52 148/62 130/59 145/64  Pulse: 67 62 69 75  Temp: 97.9 F (36.6 C) 97.9 F (36.6 C) 98.4 F (36.9 C) 98.2 F (36.8 C)  TempSrc: Oral Oral Oral Oral  Resp: 18 18 17 20   Height:      Weight:    97.07 kg (214 lb)  SpO2: 98% 94% 95% 94%    Intake/Output Summary (Last 24 hours) at 07/03/15 0743 Last data filed at 07/03/15 0625  Gross per 24 hour  Intake    480 ml  Output   2000 ml  Net  -1520 ml   Filed Weights   07/02/15 0244 07/02/15 0640 07/03/15 0626  Weight: 102.601 kg (226 lb 3.1 oz) 97.796 kg (215 lb 9.6 oz) 97.07 kg (214 lb)    General appearance: alert, cooperative and no distress Resp: Prove air entry bilaterally with a few crackles at the bases. No wheezing. No rhonchi. Cardio: S1S2 is normal, regular. Systolic murmur appreciated over the precordium. No S3, S4. No rubs, or bruit. About 2+ pitting edema bilateral lower extremities. GI: soft, non-tender; bowel sounds normal; no masses,  no organomegaly Extremities: Most of the legs covered by [employer]. seems to be less swollen. Neurologic: No focal  deficits.  Lab Results:  Basic Metabolic Panel:  Recent Labs Lab 07/02/15 0250 07/03/15 0333  NA 136 134*  K 4.8 3.4*  CL 103 98*  CO2 20* 26  GLUCOSE 171* 115*  BUN 25* 28*  CREATININE 2.11* 1.86*  CALCIUM 8.5* 8.1*   CBC:  Recent Labs Lab 07/02/15 0250 07/03/15 0333  WBC 14.3* 10.9*  HGB 13.2 11.6*  HCT 40.0 35.6*  MCV 82.3 80.9  PLT 124* 121*   Cardiac Enzymes:  Recent Labs Lab 07/02/15 0912 07/02/15 1350 07/02/15 1932  TROPONINI 0.03 0.03 0.03   BNP (last 3 results)  Recent Labs  07/02/15 0250  BNP  181.6*   CBG:  Recent Labs Lab 07/02/15 0652 07/02/15 1027 07/02/15 1632 07/02/15 2150 07/03/15 0616  GLUCAP 161* 110* 137* 140* 110*      Studies/Results: Dg Chest 2 View  07/02/2015   CLINICAL DATA:  Shortness of breath  EXAM: CHEST  2 VIEW  COMPARISON:  10/09/2014  FINDINGS: Cardiac enlargement. Pulmonary vascular congestion. Interstitial edema in the lung bases. No blunting of costophrenic angles. No pneumothorax. Calcified and tortuous aorta with ectasia. Degenerative changes in the spine.  IMPRESSION: Cardiac enlargement with mild pulmonary vascular congestion and basilar edema.   Electronically Signed   By: Lucienne Capers M.D.   On: 07/02/2015 03:22   Dg Lumbar Spine Complete  07/02/2015   CLINICAL DATA:  Shortness of breath tonight. [employer]. Low back pain. Pain both hips.  EXAM: LUMBAR SPINE - COMPLETE 4+ VIEW  COMPARISON:  None.  FINDINGS: Normal alignment of the lumbar spine. Degenerative changes throughout with bridging anterior osteophytes. No vertebral compression deformities. No focal bone lesion or bone destruction. Bone cortex and trabecular architecture appear intact. Vascular calcifications.  IMPRESSION: Degenerative changes in the lumbar spine. Normal alignment. No acute fractures identified.   Electronically Signed   By: Lucienne Capers M.D.   On: 07/02/2015 03:24   Dg Pelvis 1-2 Views  07/02/2015   CLINICAL DATA:  Shortness of breath tonight. Fall tonight. Low back pain. Pain both hips.  EXAM: PELVIS - 1-2 VIEW  COMPARISON:  None.  FINDINGS: There is no evidence of pelvic fracture or diastasis. No pelvic bone lesions are seen. Degenerative changes in both hips. Surgical clips in the pelvis. Vascular calcifications.  IMPRESSION: No acute bony abnormalities.   Electronically Signed   By: Lucienne Capers M.D.   On: 07/02/2015 03:22   Ct Head Wo Contrast  07/02/2015   CLINICAL DATA:  Initial evaluation for acute trauma, fall.  EXAM: CT HEAD WITHOUT CONTRAST  CT  CERVICAL SPINE WITHOUT CONTRAST  TECHNIQUE: Multidetector CT imaging of the head and cervical spine was performed following the standard protocol without intravenous contrast. Multiplanar CT image reconstructions of the cervical spine were also generated.  COMPARISON:  None.  FINDINGS: CT HEAD FINDINGS  Diffuse prominence of the CSF containing spaces is compatible with generalized cerebral atrophy. Patchy and confluent hypodensity within the periventricular and deep white matter both cerebral hemispheres present, most consistent with chronic small vessel ischemic disease. Small remote lacunar infarct present within the bilateral basal ganglia. Prominent vascular calcifications present within the carotid siphons and distal vertebral arteries.  No acute large vessel territory infarct. No intracranial hemorrhage. No mass lesion, midline shift, or mass effect. No extra-axial fluid collection. No hydrocephalus.  Scalp soft tissues within normal limits. No acute abnormality about the orbits.  Calvarium intact.  Chronic mucosal thickening present within the right sphenoid sinus. Mild mucosal thickening within  the max O sinuses bilaterally, likely chronic. Paranasal sinuses are otherwise clear. No mastoid effusion.  CT CERVICAL SPINE FINDINGS  Trace anterolisthesis of C6 on C7, likely chronic. Otherwise, vertebral bodies are normally aligned with preservation of the normal cervical lordosis. Vertebral body heights are preserved. Normal C1-2 articulations are intact. No prevertebral soft tissue swelling. No acute fracture or listhesis.  Advanced multilevel degenerative disc disease and facet arthropathy present throughout the cervical spine, most prevalent at C5-6 and C6-7.  No acute soft tissue abnormality. Prominent vascular calcifications present about the carotid bifurcations.  Visualized lung apices are clear without evidence of apical pneumothorax. Large amount a debris present within the distal esophagus.  IMPRESSION:  CT BRAIN:  1. No acute intracranial process. 2. Generalized cerebral atrophy with chronic microvascular ischemic disease and small remote lacunar infarcts within the bilateral basal ganglia.  CT CERVICAL SPINE:  1. No acute traumatic injury within the cervical spine. 2. Large amount of debris within the partially visualized upper esophagus. This patient is likely at risk for aspiration.   Electronically Signed   By: Jeannine Boga M.D.   On: 07/02/2015 05:00   Ct Cervical Spine Wo Contrast  07/02/2015   CLINICAL DATA:  Initial evaluation for acute trauma, fall.  EXAM: CT HEAD WITHOUT CONTRAST  CT CERVICAL SPINE WITHOUT CONTRAST  TECHNIQUE: Multidetector CT imaging of the head and cervical spine was performed following the standard protocol without intravenous contrast. Multiplanar CT image reconstructions of the cervical spine were also generated.  COMPARISON:  None.  FINDINGS: CT HEAD FINDINGS  Diffuse prominence of the CSF containing spaces is compatible with generalized cerebral atrophy. Patchy and confluent hypodensity within the periventricular and deep white matter both cerebral hemispheres present, most consistent with chronic small vessel ischemic disease. Small remote lacunar infarct present within the bilateral basal ganglia. Prominent vascular calcifications present within the carotid siphons and distal vertebral arteries.  No acute large vessel territory infarct. No intracranial hemorrhage. No mass lesion, midline shift, or mass effect. No extra-axial fluid collection. No hydrocephalus.  Scalp soft tissues within normal limits. No acute abnormality about the orbits.  Calvarium intact.  Chronic mucosal thickening present within the right sphenoid sinus. Mild mucosal thickening within the max O sinuses bilaterally, likely chronic. Paranasal sinuses are otherwise clear. No mastoid effusion.  CT CERVICAL SPINE FINDINGS  Trace anterolisthesis of C6 on C7, likely chronic. Otherwise, vertebral bodies  are normally aligned with preservation of the normal cervical lordosis. Vertebral body heights are preserved. Normal C1-2 articulations are intact. No prevertebral soft tissue swelling. No acute fracture or listhesis.  Advanced multilevel degenerative disc disease and facet arthropathy present throughout the cervical spine, most prevalent at C5-6 and C6-7.  No acute soft tissue abnormality. Prominent vascular calcifications present about the carotid bifurcations.  Visualized lung apices are clear without evidence of apical pneumothorax. Large amount a debris present within the distal esophagus.  IMPRESSION: CT BRAIN:  1. No acute intracranial process. 2. Generalized cerebral atrophy with chronic microvascular ischemic disease and small remote lacunar infarcts within the bilateral basal ganglia.  CT CERVICAL SPINE:  1. No acute traumatic injury within the cervical spine. 2. Large amount of debris within the partially visualized upper esophagus. This patient is likely at risk for aspiration.   Electronically Signed   By: Jeannine Boga M.D.   On: 07/02/2015 05:00    Medications:  Scheduled: . amoxicillin  500 mg Oral Q12H  . aspirin EC  81 mg Oral q morning - 10a  .  atorvastatin  20 mg Oral QHS  . carbidopa-levodopa  1 tablet Oral QPM  . diltiazem  360 mg Oral Daily  . feeding supplement (GLUCERNA SHAKE)  237 mL Oral BID BM  . feeding supplement (PRO-STAT SUGAR FREE 64)  30 mL Oral BID WC  . furosemide  80 mg Intravenous Once  . gabapentin  300 mg Oral BID  . heparin  5,000 Units Subcutaneous 3 times per day  . insulin aspart  0-9 Units Subcutaneous TID WC  . levothyroxine  112 mcg Oral QAC breakfast  . LORazepam  0.5 mg Oral TID  . mometasone-formoterol  2 puff Inhalation BID  . multivitamin with minerals  1 tablet Oral Daily  . pneumococcal 23 valent vaccine  0.5 mL Intramuscular Tomorrow-1000  . QUEtiapine  25 mg Oral QHS  . sertraline  100 mg Oral QPM  . sodium chloride  3 mL  Intravenous Q12H   Continuous:  LJQ:GBEEFE chloride, acetaminophen, albuterol, ipratropium, ondansetron (ZOFRAN) IV, oxyCODONE, sodium chloride  Assessment/Plan:  Principal Problem:   Acute respiratory failure with hypoxia Active Problems:   ESOPHAGEAL MOTILITY DISORDER   Atherosclerosis of native arteries of the extremities with ulceration   Moderate aortic stenosis   Pulmonary edema   Leukocytosis   DM2 (diabetes mellitus, type 2)    Acute respiratory failure with hypoxia Improving with Lasix. He did diurese quite well last night. CXR showed basilar pulmonary edema and pulmonary vascular congestion.Concern for diastolic heart failure. Echocardiogram is  as above He was noted to have normal systolic function in 0712. Continue with IV Lasix. His lower extremity edema seems to be improving. There is also a component of chronic venous stasis. No ACEi ordered due to kidney function. Repeat chest x-ray today. Replace potassium.  Moderate to severe aortic stenosis with acute on chronic diastolic heart failure Aortic stenosis appears to have progressed based on echocardiogram. This could be contributing to his symptoms. Plus, he hesitates taking diuretics at home as he feels that they hurt his kidneys. I have explained to him that it is important to take the medications to prevent excessive fluid buildup in the body. Regarding the aortic stenosis. I have discussed with his wife. However, they should discuss this issue with the patient's primary care physician first before seeing a consultant as it may be that patient may not be a candidate for any intervention.  Leukocytosis/history of osteomyelitis right calcaneum He is afebrile. No obvious source of infection. He does ever chronic wound in his right heel. He takes amoxicillin chronically for suppression. He has been seen by infectious disease in the past.  This was resumed. WBC is better.  Acute on CKD stage III Creatinine  wasup to 2.0  from 1.5 baseline.  Improved today. Monitor urine output. No need for further investigations at this time.  BLE wounds Seen by the wound care nurse. Patient was getting home health for same.  DM2 Holding home tradjenda. Sliding scale insulin coverage.  History of essential hypertension Monitor blood pressures closely. Continue medications.  Esophageal dysmotility disorder With debris in esophagus on CT neck. SLP consult note reviewed. Patient denies any difficulty with swallowing. Diet was advanced last night. This can be pursued as an outpatient.   DVT Prophylaxis: Subcutaneous heparin    Code Status: Full code  Family Communication: Discussed with the patient  Disposition Plan: Continue current treatment. PT and OT evaluation. . Home health has been recommended. This will be ordered. Anticipate discharge tomorrow.  Follow-up Appointment?: Will need to  follow-up with his primary care physician within one week after discharge.Marland Kitchen His wife will set up the appointment.   LOS: 1 day   [facility] Pager [phone] 07/03/2015, 7:43 AM  If 7PM-7AM, please contact night-coverage at [email or web address], password [facility]

## 2015-07-03 NOTE — Care Management Note (Signed)
Case Management Note  Patient Details  Name: [name] MRN: [id number] Date of Birth: [birth year]  Subjective/Objective:        Admitted with Acute respiratory failure with hypoxia            Action/Plan: Patient lives at home with spouse, use a walker at home. Patient stated that he has [place] and they change his dressings on his legs twice a week; PCP is Dr Virgina Jock, has private insurance with [facility] and does not have any problem getting his medication - pharmacy of choice is [facility]; CM will continue to follow for DCP. Expected Discharge Date:     Possibly 07/04/2015             Expected Discharge Plan:  [employer]   Discharge planning Services  CM Consult Choice offered to:  Patient  HH Arranged:  RN, PT, OT [facility] Agency:  [place]  Status of Service:  In process, will continue to follow   Sherrilyn Rist [phone] 07/03/2015, 11:10 AM

## 2015-07-03 NOTE — Progress Notes (Signed)
Ambulated patient in the hall on room-air. He o2 sats remained 93% or greater. He is extremely unsteady on his feet and his heart rate dipped into low 40's upper 30's according to pulse ox monitor.  His heart rate quickly increase once he sat down.

## 2015-07-04 LAB — BASIC METABOLIC PANEL
Anion gap: 8 (ref 5–15)
BUN: 24 mg/dL — ABNORMAL HIGH (ref 6–20)
CALCIUM: 8.4 mg/dL — AB (ref 8.9–10.3)
CO2: 31 mmol/L (ref 22–32)
Chloride: 97 mmol/L — ABNORMAL LOW (ref 101–111)
Creatinine, Ser: 1.6 mg/dL — ABNORMAL HIGH (ref 0.61–1.24)
GFR calc Af Amer: 44 mL/min — ABNORMAL LOW (ref >60)
GFR calc non Af Amer: 38 mL/min — ABNORMAL LOW (ref >60)
GLUCOSE: 112 mg/dL — AB (ref 65–99)
Potassium: 3.3 mmol/L — ABNORMAL LOW (ref 3.5–5.1)
SODIUM: 136 mmol/L (ref 135–145)

## 2015-07-04 LAB — GLUCOSE, CAPILLARY
GLUCOSE-CAPILLARY: 143 mg/dL — AB (ref 65–99)
Glucose-Capillary: 116 mg/dL — ABNORMAL HIGH (ref 65–99)

## 2015-07-04 MED ORDER — POTASSIUM CHLORIDE CRYS ER 20 MEQ PO TBCR: 40.0000 meq | EXTENDED_RELEASE_TABLET | Freq: Once | ORAL | Status: DC

## 2015-07-04 MED ORDER — POTASSIUM CHLORIDE CRYS ER 20 MEQ PO TBCR
40.0000 meq | EXTENDED_RELEASE_TABLET | Freq: Once | ORAL | Status: AC
  Administered 2015-07-04: 40 meq via ORAL
  Filled 2015-07-04: qty 2

## 2015-07-04 MED ORDER — FUROSEMIDE 80 MG PO TABS: 80.0000 mg | ORAL_TABLET | Freq: Every day | ORAL | Status: DC

## 2015-07-04 MED ORDER — AMOXICILLIN 500 MG PO CAPS: 500.0000 mg | ORAL_CAPSULE | Freq: Two times a day (BID) | ORAL | Status: DC

## 2015-07-04 MED ORDER — FUROSEMIDE 10 MG/ML IJ SOLN
80.0000 mg | Freq: Once | INTRAMUSCULAR | Status: AC
  Administered 2015-07-04: 80 mg via INTRAVENOUS
  Filled 2015-07-04: qty 8

## 2015-07-04 NOTE — Progress Notes (Signed)
NURSING PROGRESS NOTE  [name] [id number] Discharge Data: 07/04/2015 1:38 PM Attending Provider: Bonnielee Haff, MD PPJ:KDTOI,ZTIW Jerilynn Mages, MD     [name] to be D/C'd home (resume home health) per MD order.  Discussed with the patient the After Visit Summary and all questions fully answered. All IV's discontinued with no bleeding noted. All belongings returned to patient for patient to take home.   Last Vital Signs:  Blood pressure 161/70, pulse 84, temperature 98.3 F (36.8 C), temperature source Oral, resp. rate 20, height 6' (1.829 m), weight 94.257 kg (207 lb 12.8 oz), SpO2 95 %.  Discharge Medication List   Medication List    TAKE these medications        acetaminophen 325 MG tablet  Commonly known as:  TYLENOL  Take 650 mg by mouth every 6 (six) hours as needed. For pain or fever     albuterol (5 MG/ML) 0.5% nebulizer solution  Commonly known as:  PROVENTIL  Take 2.5 mg by nebulization every 6 (six) hours as needed. For shortness of breath     amoxicillin 500 MG capsule  Commonly known as:  AMOXIL  Take 1 capsule (500 mg total) by mouth every 12 (twelve) hours.     aspirin EC 81 MG tablet  Take 81 mg by mouth every morning.     carbidopa-levodopa 10-100 MG per tablet  Commonly known as:  SINEMET IR  Take 1 tablet by mouth every evening.     diltiazem 360 MG 24 hr capsule  Commonly known as:  CARDIZEM CD  Take 1 capsule (360 mg total) by mouth daily.     feeding supplement (ENSURE) Pudg  Take 1 Container by mouth 3 (three) times daily between meals.     Fluticasone-Salmeterol 250-50 MCG/DOSE Aepb  Commonly known as:  ADVAIR  Inhale 1-2 puffs into the lungs 2 (two) times daily as needed (for wheezing.).     furosemide 80 MG tablet  Commonly known as:  LASIX  Take 1 tablet (80 mg total) by mouth daily.     gabapentin 300 MG capsule  Commonly known as:  NEURONTIN  Take 1 capsule (300 mg total) by mouth 2 (two) to 3 (three)  times daily.     ipratropium 0.02 % nebulizer solution  Commonly known as:  ATROVENT  Take 500 mcg by nebulization every 6 (six) hours as needed. For shortness of breath     levothyroxine 112 MCG tablet  Commonly known as:  SYNTHROID, LEVOTHROID  Take 112 mcg by mouth every morning.     linagliptin 5 MG Tabs tablet  Commonly known as:  TRADJENTA  Take 1 tablet (5 mg total) by mouth daily.     LORazepam 0.5 MG tablet  Commonly known as:  ATIVAN  Take 1 tablet by mouth 3 (three) times daily.     MELATIN PO  Take 1 tablet by mouth at bedtime as needed (sleep).     Oxycodone HCl 10 MG Tabs  Take 1 tablet by mouth every 4 (four) hours as needed.     potassium chloride SA 20 MEQ tablet  Commonly known as:  K-DUR,KLOR-CON  Take 2 tablets (40 mEq total) by mouth once.     QUEtiapine 25 MG tablet  Commonly known as:  SEROQUEL  Take 1 tablet (25 mg total) by mouth at bedtime. May take 1-2 during the day as needed.     sertraline 100 MG tablet  Commonly known as:  ZOLOFT  Take 100  mg by mouth every evening.     simvastatin 40 MG tablet  Commonly known as:  ZOCOR  Take 40 mg by mouth at bedtime.

## 2015-07-04 NOTE — Discharge Summary (Addendum)
[facility]  Physician Discharge Summary   Patient ID: [name] MRN: [id number] DOB/AGE: 1929/07/29 81 y.o.  Admit date: 07/02/2015 Discharge date: 07/04/2015  PCP: [name], MD  DISCHARGE DIAGNOSES:  Principal Problem:   Acute respiratory failure with hypoxia Active Problems:   ESOPHAGEAL MOTILITY DISORDER   Atherosclerosis of native arteries of the extremities with ulceration   Moderate aortic stenosis   Pulmonary edema   Leukocytosis   DM2 (diabetes mellitus, type 2)   RECOMMENDATIONS FOR OUTPATIENT FOLLOW UP: 1. Patient to follow-up with his primary care physician in the next 5-7 days. They already have an appointment. 2. He will benefit from repeat blood work as an outpatient to check his renal function 3. His primary care physician to determine if he will benefit from referral to cardiology 4. Health PT and OT has been ordered. Home oxygen has been ordered as well.   DISCHARGE CONDITION: fair  Diet recommendation: Modified carbohydrate  Filed Weights   07/02/15 0640 07/03/15 0626 07/04/15 0500  Weight: 97.796 kg (215 lb 9.6 oz) 97.07 kg (214 lb) 94.257 kg (207 lb 12.8 oz)    INITIAL HISTORY: 81 year old Caucasian male with a past medical history of COPD, peripheral vascular disease, chronic lower extremity venous stasis with chronic heel wound, chronic kidney disease stage III. He presented due to fall at home along with shortness of breath. He does not use oxygen at home. Is supposed to be taking torsemide as needed for leg swelling, but has not taken it recently. Chest x-ray revealed pulmonary edema. He was hospitalized for further management.  Consultants: None  Procedures:  2-D echocardiogram Study Conclusions - Left ventricle: The cavity size was normal. There was mildconcentric hypertrophy. Systolic function was normal. Theestimated ejection fraction was in the range of 60% to 65%. Thereis akinesis of the basalinferior  myocardium. - Aortic valve: Valve mobility was restricted. There was moderateto severe stenosis. Peak velocity (S): 393 cm/s. Mean gradient(S): 34 mm Hg. - Aorta: Ascending aortic diameter: 41 mm (S). - Ascending aorta: The ascending aorta was mildly dilated. - Mitral valve: Calcified annulus. - Left atrium: The atrium was mildly dilated. Impressions: - When compared to prior echocardiogram, aortic stenosis hasadvanced.   HOSPITAL COURSE:   Acute respiratory failure with hypoxia Issues x-ray showed primary edema. Patient was started on intravenous Lasix. He is on diuretics at home, but is not compliant. He has diuresed significantly and has lost almost 10 pounds during this hospitalization. His breathing has improved. His oxygen saturation is at rest are normal, but with exertion drops to 88%. He will be set up with home oxygen. Repeat chest x-ray shows improvement in pulmonary edema. Echocardiogram report as above. No Ace inhibitors due to chronic kidney disease.   Moderate to severe aortic stenosis with acute on chronic diastolic heart failure Aortic stenosis appears to have progressed based on echocardiogram. This could be contributing to his symptoms. Plus, he hesitates taking diuretics at home as he feels that they "hurt his kidneys." I have explained to him that it is important to take the medications to prevent excessive fluid buildup in the body. Regarding the aortic stenosis. I have discussed with his wife. However, they should discuss this issue with the patient's primary care physician first before seeing a consultant as it may be that patient may not be a candidate for any intervention apart from medical management.  Leukocytosis/history of osteomyelitis right calcaneum He is afebrile. No obvious source of infection. He does have a chronic wound  in his right heel. He takes amoxicillin chronically for suppression. He has been seen by infectious disease in the past. This was resumed.  WBC is better.  Acute on CKD stage III Creatininewas up to 2.0 from 1.5 baseline.With IV Lasix. Creatinine has improved with improving urine output. He will need repeat blood work as an outpatient.  BLE wounds Seen by the wound care nurse. Patient was getting home health for same.  DM2 Resume his home medication regimen.  History of essential hypertension Stable. Continue current management  Esophageal dysmotility disorder He was noted to have debris in esophagus on CT neck. He was seen by speech therapist. There was no oral pharyngeal component. Patient denies any difficulty swallowing. He has been tolerating a soft diet without any problems. He may discuss this further with his primary care physician.   His dose of simvastatin was reduced due to possible interaction with Cardizem increasing the risk of myopathy.   All of the above was discussed with the patient's wife. Patient wanted to go home today. He is considered stable for discharge.   PERTINENT LABS:  The results of significant diagnostics from this hospitalization (including imaging, microbiology, ancillary and laboratory) are listed below for reference.    Microbiology: Recent Results (from the past 240 hour(s))  Urine culture     Status: None   Collection Time: 07/02/15  4:40 AM  Result Value Ref Range Status   Specimen Description URINE, CATHETERIZED  Final   Special Requests NONE  Final   Culture NO GROWTH 1 DAY  Final   Report Status 07/03/2015 FINAL  Final  Blood culture (routine x 2)     Status: None (Preliminary result)   Collection Time: 07/02/15  5:25 AM  Result Value Ref Range Status   Specimen Description BLOOD FOREARM RIGHT  Final   Special Requests BOTTLES DRAWN AEROBIC AND ANAEROBIC 5CC  Final   Culture NO GROWTH 2 DAYS  Final   Report Status PENDING  Incomplete  Blood culture (routine x 2)     Status: None (Preliminary result)   Collection Time: 07/02/15  5:30 AM  Result Value Ref Range Status    Specimen Description BLOOD HAND RIGHT  Final   Special Requests BOTTLES DRAWN AEROBIC AND ANAEROBIC 5CC  Final   Culture NO GROWTH 2 DAYS  Final   Report Status PENDING  Incomplete     Labs: Basic Metabolic Panel:  Recent Labs Lab 07/02/15 0250 07/03/15 0333 07/04/15 0325  NA 136 134* 136  K 4.8 3.4* 3.3*  CL 103 98* 97*  CO2 20* 26 31  GLUCOSE 171* 115* 112*  BUN 25* 28* 24*  CREATININE 2.11* 1.86* 1.60*  CALCIUM 8.5* 8.1* 8.4*   CBC:  Recent Labs Lab 07/02/15 0250 07/03/15 0333  WBC 14.3* 10.9*  HGB 13.2 11.6*  HCT 40.0 35.6*  MCV 82.3 80.9  PLT 124* 121*   Cardiac Enzymes:  Recent Labs Lab 07/02/15 0912 07/02/15 1350 07/02/15 1932  TROPONINI 0.03 0.03 0.03   BNP: BNP (last 3 results)  Recent Labs  07/02/15 0250  BNP 181.6*    CBG:  Recent Labs Lab 07/03/15 1109 07/03/15 1621 07/03/15 2132 07/04/15 0556 07/04/15 1120  GLUCAP 101* 147* 105* 143* 116*     IMAGING STUDIES Dg Chest 2 View  07/03/2015   CLINICAL DATA:  Evaluate pulmonary edema.  EXAM: CHEST  2 VIEW  COMPARISON:  Chest radiograph 07/02/2015.  FINDINGS: Monitoring leads overlie the patient. Stable cardiomegaly. Mild interval improvement pulmonary  vascular redistribution and mid and lower lung interstitial opacities. Small bilateral pleural effusions. Mid thoracic spine degenerative changes.  IMPRESSION: Cardiomegaly with slight interval improvement pulmonary edema.   Electronically Signed   By: Lovey Newcomer M.D.   On: 07/03/2015 13:02   Dg Chest 2 View  07/02/2015   CLINICAL DATA:  Shortness of breath  EXAM: CHEST  2 VIEW  COMPARISON:  10/09/2014  FINDINGS: Cardiac enlargement. Pulmonary vascular congestion. Interstitial edema in the lung bases. No blunting of costophrenic angles. No pneumothorax. Calcified and tortuous aorta with ectasia. Degenerative changes in the spine.  IMPRESSION: Cardiac enlargement with mild pulmonary vascular congestion and basilar edema.   Electronically  Signed   By: Lucienne Capers M.D.   On: 07/02/2015 03:22   Dg Lumbar Spine Complete  07/02/2015   CLINICAL DATA:  Shortness of breath tonight. [employer]. Low back pain. Pain both hips.  EXAM: LUMBAR SPINE - COMPLETE 4+ VIEW  COMPARISON:  None.  FINDINGS: Normal alignment of the lumbar spine. Degenerative changes throughout with bridging anterior osteophytes. No vertebral compression deformities. No focal bone lesion or bone destruction. Bone cortex and trabecular architecture appear intact. Vascular calcifications.  IMPRESSION: Degenerative changes in the lumbar spine. Normal alignment. No acute fractures identified.   Electronically Signed   By: Lucienne Capers M.D.   On: 07/02/2015 03:24   Dg Pelvis 1-2 Views  07/02/2015   CLINICAL DATA:  Shortness of breath tonight. Fall tonight. Low back pain. Pain both hips.  EXAM: PELVIS - 1-2 VIEW  COMPARISON:  None.  FINDINGS: There is no evidence of pelvic fracture or diastasis. No pelvic bone lesions are seen. Degenerative changes in both hips. Surgical clips in the pelvis. Vascular calcifications.  IMPRESSION: No acute bony abnormalities.   Electronically Signed   By: Lucienne Capers M.D.   On: 07/02/2015 03:22   Ct Head Wo Contrast  07/02/2015   CLINICAL DATA:  Initial evaluation for acute trauma, fall.  EXAM: CT HEAD WITHOUT CONTRAST  CT CERVICAL SPINE WITHOUT CONTRAST  TECHNIQUE: Multidetector CT imaging of the head and cervical spine was performed following the standard protocol without intravenous contrast. Multiplanar CT image reconstructions of the cervical spine were also generated.  COMPARISON:  None.  FINDINGS: CT HEAD FINDINGS  Diffuse prominence of the CSF containing spaces is compatible with generalized cerebral atrophy. Patchy and confluent hypodensity within the periventricular and deep white matter both cerebral hemispheres present, most consistent with chronic small vessel ischemic disease. Small remote lacunar infarct present within the  bilateral basal ganglia. Prominent vascular calcifications present within the carotid siphons and distal vertebral arteries.  No acute large vessel territory infarct. No intracranial hemorrhage. No mass lesion, midline shift, or mass effect. No extra-axial fluid collection. No hydrocephalus.  Scalp soft tissues within normal limits. No acute abnormality about the orbits.  Calvarium intact.  Chronic mucosal thickening present within the right sphenoid sinus. Mild mucosal thickening within the max O sinuses bilaterally, likely chronic. Paranasal sinuses are otherwise clear. No mastoid effusion.  CT CERVICAL SPINE FINDINGS  Trace anterolisthesis of C6 on C7, likely chronic. Otherwise, vertebral bodies are normally aligned with preservation of the normal cervical lordosis. Vertebral body heights are preserved. Normal C1-2 articulations are intact. No prevertebral soft tissue swelling. No acute fracture or listhesis.  Advanced multilevel degenerative disc disease and facet arthropathy present throughout the cervical spine, most prevalent at C5-6 and C6-7.  No acute soft tissue abnormality. Prominent vascular calcifications present about the carotid bifurcations.  Visualized  lung apices are clear without evidence of apical pneumothorax. Large amount a debris present within the distal esophagus.  IMPRESSION: CT BRAIN:  1. No acute intracranial process. 2. Generalized cerebral atrophy with chronic microvascular ischemic disease and small remote lacunar infarcts within the bilateral basal ganglia.  CT CERVICAL SPINE:  1. No acute traumatic injury within the cervical spine. 2. Large amount of debris within the partially visualized upper esophagus. This patient is likely at risk for aspiration.   Electronically Signed   By: Jeannine Boga M.D.   On: 07/02/2015 05:00   Ct Cervical Spine Wo Contrast  07/02/2015   CLINICAL DATA:  Initial evaluation for acute trauma, fall.  EXAM: CT HEAD WITHOUT CONTRAST  CT CERVICAL  SPINE WITHOUT CONTRAST  TECHNIQUE: Multidetector CT imaging of the head and cervical spine was performed following the standard protocol without intravenous contrast. Multiplanar CT image reconstructions of the cervical spine were also generated.  COMPARISON:  None.  FINDINGS: CT HEAD FINDINGS  Diffuse prominence of the CSF containing spaces is compatible with generalized cerebral atrophy. Patchy and confluent hypodensity within the periventricular and deep white matter both cerebral hemispheres present, most consistent with chronic small vessel ischemic disease. Small remote lacunar infarct present within the bilateral basal ganglia. Prominent vascular calcifications present within the carotid siphons and distal vertebral arteries.  No acute large vessel territory infarct. No intracranial hemorrhage. No mass lesion, midline shift, or mass effect. No extra-axial fluid collection. No hydrocephalus.  Scalp soft tissues within normal limits. No acute abnormality about the orbits.  Calvarium intact.  Chronic mucosal thickening present within the right sphenoid sinus. Mild mucosal thickening within the max O sinuses bilaterally, likely chronic. Paranasal sinuses are otherwise clear. No mastoid effusion.  CT CERVICAL SPINE FINDINGS  Trace anterolisthesis of C6 on C7, likely chronic. Otherwise, vertebral bodies are normally aligned with preservation of the normal cervical lordosis. Vertebral body heights are preserved. Normal C1-2 articulations are intact. No prevertebral soft tissue swelling. No acute fracture or listhesis.  Advanced multilevel degenerative disc disease and facet arthropathy present throughout the cervical spine, most prevalent at C5-6 and C6-7.  No acute soft tissue abnormality. Prominent vascular calcifications present about the carotid bifurcations.  Visualized lung apices are clear without evidence of apical pneumothorax. Large amount a debris present within the distal esophagus.  IMPRESSION: CT  BRAIN:  1. No acute intracranial process. 2. Generalized cerebral atrophy with chronic microvascular ischemic disease and small remote lacunar infarcts within the bilateral basal ganglia.  CT CERVICAL SPINE:  1. No acute traumatic injury within the cervical spine. 2. Large amount of debris within the partially visualized upper esophagus. This patient is likely at risk for aspiration.   Electronically Signed   By: Jeannine Boga M.D.   On: 07/02/2015 05:00    DISCHARGE EXAMINATION: Filed Vitals:   07/03/15 2059 07/04/15 0201 07/04/15 0500 07/04/15 0825  BP: 164/67 162/85 161/70   Pulse: 85 80 84   Temp: 98 F (36.7 C) 98.2 F (36.8 C) 98.3 F (36.8 C)   TempSrc: Oral Oral Oral   Resp: 20 20 20    Height:      Weight:   94.257 kg (207 lb 12.8 oz)   SpO2: 94% 95% 95% 95%   General appearance: alert, cooperative, appears stated age and no distress Resp: Few Crackles bilateral bases. No wheezing. No rhonchi. Cardio: regular rate and rhythm, S1, S2 normal, no murmur, click, rub or gallop GI: soft, non-tender; bowel sounds normal; no masses,  no organomegaly Extremities: Both legs are covered with dressings. They appear to be less swollen.  DISPOSITION: Home with wife  Discharge Instructions    Call MD for:  difficulty breathing, headache or visual disturbances    Complete by:  As directed      Call MD for:  extreme fatigue    Complete by:  As directed      Call MD for:  persistant dizziness or light-headedness    Complete by:  As directed      Call MD for:  persistant nausea and vomiting    Complete by:  As directed      Call MD for:  severe uncontrolled pain    Complete by:  As directed      Diet Carb Modified    Complete by:  As directed      Discharge instructions    Complete by:  As directed   Please see your PCP in follow up regarding your aortic stenosis. Will also need repeat blood work to check his renal function.  You were cared for by a hospitalist during your  hospital stay. If you have any questions about your discharge medications or the care you received while you were in the hospital after you are discharged, you can call the unit and asked to speak with the hospitalist on call if the hospitalist that took care of you is not available. Once you are discharged, your primary care physician will handle any further medical issues. Please note that NO REFILLS for any discharge medications will be authorized once you are discharged, as it is imperative that you return to your primary care physician (or establish a relationship with a primary care physician if you do not have one) for your aftercare needs so that they can reassess your need for medications and monitor your lab values. If you do not have a primary care physician, you can call [phone] for a physician referral.     Increase activity slowly    Complete by:  As directed            ALLERGIES: No Known Allergies   Discharge Medication List as of 07/04/2015 12:51 PM    START taking these medications   Details  amoxicillin (AMOXIL) 500 MG capsule Take 1 capsule (500 mg total) by mouth every 12 (twelve) hours., Starting 07/04/2015, Until Discontinued, Print    furosemide (LASIX) 80 MG tablet Take 1 tablet (80 mg total) by mouth daily., Starting 07/04/2015, Until Discontinued, Print    potassium chloride SA (K-DUR,KLOR-CON) 20 MEQ tablet Take 2 tablets (40 mEq total) by mouth once., Starting 07/04/2015, Print      CONTINUE these medications which have NOT CHANGED   Details  acetaminophen (TYLENOL) 325 MG tablet Take 650 mg by mouth every 6 (six) hours as needed. For pain or fever, Until Discontinued, Historical Med    albuterol (PROVENTIL) (5 MG/ML) 0.5% nebulizer solution Take 2.5 mg by nebulization every 6 (six) hours as needed. For shortness of breath, Until Discontinued, Historical Med    aspirin EC 81 MG tablet Take 81 mg by mouth every morning. , Until Discontinued, Historical Med      carbidopa-levodopa (SINEMET) 10-100 MG per tablet Take 1 tablet by mouth every evening. , Until Discontinued, Historical Med    diltiazem (CARDIZEM CD) 360 MG 24 hr capsule Take 1 capsule (360 mg total) by mouth daily., Starting 10/09/2014, Until Discontinued, Print    Fluticasone-Salmeterol (ADVAIR) 250-50 MCG/DOSE AEPB Inhale 1-2 puffs into  the lungs 2 (two) times daily as needed (for wheezing.)., Until Discontinued, Historical Med    gabapentin (NEURONTIN) 300 MG capsule Take 1 capsule (300 mg total) by mouth 2 (two) to 3 (three)  times daily., No Print    ipratropium (ATROVENT) 0.02 % nebulizer solution Take 500 mcg by nebulization every 6 (six) hours as needed. For shortness of breath, Until Discontinued, Historical Med    levothyroxine (SYNTHROID, LEVOTHROID) 112 MCG tablet Take 112 mcg by mouth every morning. , Until Discontinued, Historical Med    linagliptin (TRADJENTA) 5 MG TABS tablet Take 1 tablet (5 mg total) by mouth daily., Starting 10/09/2014, Until Discontinued, Print    LORazepam (ATIVAN) 0.5 MG tablet Take 1 tablet by mouth 3 (three) times daily., Starting 06/02/2015, Until Discontinued, Historical Med    Melatonin-Pyridoxine (MELATIN PO) Take 1 tablet by mouth at bedtime as needed (sleep)., Until Discontinued, Historical Med    Oxycodone HCl 10 MG TABS Take 1 tablet by mouth every 4 (four) hours as needed., Starting 06/20/2015, Until Discontinued, Historical Med    QUEtiapine (SEROQUEL) 25 MG tablet Take 1 tablet (25 mg total) by mouth at bedtime. May take 1-2 during the day as needed., Starting 10/09/2014, Until Discontinued, No Print    sertraline (ZOLOFT) 100 MG tablet Take 100 mg by mouth every evening. , Until Discontinued, Historical Med    simvastatin (ZOCOR) 40 MG tablet Take 40 mg by mouth at bedtime. , Until Discontinued, Historical Med    feeding supplement (ENSURE) PUDG Take 1 Container by mouth 3 (three) times daily between meals., Starting 08/23/2012, Until  Discontinued, OTC       Follow-up Information    Follow up with [facility].   Why:  they will continue to provide you with as home health nurse as prior to admission   Contact information:   [address] [place] [address] [phone]       Follow up with [name], MD. Schedule an appointment as soon as possible for a visit in 1 week.   Specialty:  Internal Medicine   Why:  post hospitalization follow up and to discuss the Aortic stenosis.   Contact information:   [address] [place] [address] [phone]       TOTAL DISCHARGE TIME: 35 minutes  [facility] Pager [phone]  07/04/2015, 2:43 PM

## 2015-07-04 NOTE — Discharge Instructions (Signed)
Aortic Stenosis  Aortic stenosis is a narrowing of the aortic valve. The aortic valve is a gatelike structure that is located between the lower left chamber of the heart (left ventricle) and the blood vessel that leads away from the heart (aorta). When the aortic valve is narrowed, it does not open all the way. This makes it hard for the heart to pump blood into the aorta and causes the heart to work harder. The extra work can weaken the heart over time and lead to heart failure.  CAUSES   Causes of aortic stenosis include:  · Calcium deposits on the aortic valve that have made the valve stiff. This condition generally affects those over the age of 65. It is the most common cause of aortic stenosis.  · A birth defect.   · Rheumatic fever. This is a problem that may occur after a strep throat infection that was not treated adequately. Rheumatic fever can cause permanent damage to heart valves.  SIGNS AND SYMPTOMS   People with aortic stenosis usually have no symptoms until the condition becomes severe. It may take 10-20 years for mild or moderate aortic stenosis to become severe. Symptoms may include:   · Shortness of breath, especially with physical activity.    · Feeling weak and tired (fatigued) or getting tired easily.  · Chest discomfort (angina). This may occur with minimal activity if the aortic stenosis is severe.   · An irregular or faster-than-normal heartbeat.  · Dizziness or fainting that happens with exertion or after taking certain heart medicines (such as nitroglycerin).  DIAGNOSIS   Aortic stenosis is usually diagnosed with a physical exam and with a type of imaging test called echocardiography. During echocardiography, sound waves are used to evaluate how blood flows through the heart. If your health care provider suspects aortic stenosis but the test does not clearly show it, a procedure called cardiac catheterization may be done to diagnose the condition. Tests may also be done to evaluate heart  function. They may include:  · Electrocardiography. During this test, the electrical impulses of the heart are recorded while you are lying down and sticky patches are placed on your chest, arms, and legs.  · Stress tests. There is more than one type of stress test. If a stress test is needed, ask your health care provider about which type is best for you.  · Blood tests.  TREATMENT   Treatment depends on how severe the aortic stenosis is, your symptoms, and the problems it is causing.   · Observation. If the aortic stenosis is mild, no treatment may be needed. However, you will need to have the condition checked regularly to make sure it is not getting worse or causing serious problems.  · Surgery. Surgery to repair or replace the aortic valve is the most common treatment for aortic stenosis. Several types of surgeries are available. The most common are open-heart surgery and transcutaneous aortic valve replacement (TAVR). TAVR does not require that the chest be opened. It is usually performed on elderly patients and those who are not able to have open-heart surgery.  · Medicines. Medicines may be given to keep symptoms from getting worse. Medicines cannot reverse aortic stenosis.  HOME CARE INSTRUCTIONS   · You may need to avoid certain types of physical activity. If your aortic stenosis is mild, you may need to avoid only strenuous activity. The more severe your aortic stenosis, the more activities you will need to avoid. Talk with your health care provider about   the types of activity you should avoid.  · Take medicines only as directed by your health care provider.  · If you are a woman with aortic valve stenosis and want to get pregnant, talk to your health care provider before you become pregnant.  · If you are a woman with aortic valve stenosis and are pregnant, keep all follow-up visits with all recommended health care providers.  · Keep all follow-up visits for tests, exams, and treatments as directed by  your health care provider.  SEEK IMMEDIATE MEDICAL CARE IF:  · You develop chest pain or tightness.    · You develop shortness of breath or difficulty breathing.    · You develop light-headedness or faint.    · It feels like your heartbeat is irregular or faster than normal.  · You have a fever.  Document Released: 09/03/2003 Document Revised: 04/21/2014 Document Reviewed: 11/29/2012  [employer]® Patient Information ©2015 [employer]. This information is not intended to replace advice given to you by your health care provider. Make sure you discuss any questions you have with your health care provider.

## 2015-07-07 LAB — CULTURE, BLOOD (ROUTINE X 2)
Culture: NO GROWTH
Culture: NO GROWTH

## 2015-07-08 ENCOUNTER — Inpatient Hospital Stay (HOSPITAL_COMMUNITY)
Admission: EM | Admit: 2015-07-08 | Discharge: 2015-07-13 | DRG: 871 | Disposition: A | Discharge status: Skilled Nursing Facility | Payer: Medicare Other | Attending: Family Medicine | Admitting: Family Medicine

## 2015-07-08 ENCOUNTER — Emergency Department (HOSPITAL_COMMUNITY): Payer: Medicare Other

## 2015-07-08 ENCOUNTER — Encounter (HOSPITAL_COMMUNITY): Payer: Self-pay | Admitting: Emergency Medicine

## 2015-07-08 DIAGNOSIS — F319 Bipolar disorder, unspecified: Secondary | ICD-10-CM | POA: Diagnosis present

## 2015-07-08 DIAGNOSIS — J449 Chronic obstructive pulmonary disease, unspecified: Secondary | ICD-10-CM | POA: Diagnosis present

## 2015-07-08 DIAGNOSIS — I83009 Varicose veins of unspecified lower extremity with ulcer of unspecified site: Secondary | ICD-10-CM

## 2015-07-08 DIAGNOSIS — I4510 Unspecified right bundle-branch block: Secondary | ICD-10-CM | POA: Diagnosis present

## 2015-07-08 DIAGNOSIS — I5032 Chronic diastolic (congestive) heart failure: Secondary | ICD-10-CM | POA: Diagnosis present

## 2015-07-08 DIAGNOSIS — I493 Ventricular premature depolarization: Secondary | ICD-10-CM | POA: Diagnosis present

## 2015-07-08 DIAGNOSIS — Z7982 Long term (current) use of aspirin: Secondary | ICD-10-CM

## 2015-07-08 DIAGNOSIS — Z87442 Personal history of urinary calculi: Secondary | ICD-10-CM | POA: Diagnosis not present

## 2015-07-08 DIAGNOSIS — Z833 Family history of diabetes mellitus: Secondary | ICD-10-CM

## 2015-07-08 DIAGNOSIS — A419 Sepsis, unspecified organism: Secondary | ICD-10-CM | POA: Diagnosis not present

## 2015-07-08 DIAGNOSIS — I248 Other forms of acute ischemic heart disease: Secondary | ICD-10-CM | POA: Diagnosis present

## 2015-07-08 DIAGNOSIS — E039 Hypothyroidism, unspecified: Secondary | ICD-10-CM | POA: Diagnosis present

## 2015-07-08 DIAGNOSIS — K219 Gastro-esophageal reflux disease without esophagitis: Secondary | ICD-10-CM | POA: Diagnosis present

## 2015-07-08 DIAGNOSIS — N183 Chronic kidney disease, stage 3 unspecified: Secondary | ICD-10-CM | POA: Diagnosis present

## 2015-07-08 DIAGNOSIS — J9691 Respiratory failure, unspecified with hypoxia: Secondary | ICD-10-CM | POA: Diagnosis not present

## 2015-07-08 DIAGNOSIS — E785 Hyperlipidemia, unspecified: Secondary | ICD-10-CM | POA: Diagnosis present

## 2015-07-08 DIAGNOSIS — E872 Acidosis: Secondary | ICD-10-CM | POA: Diagnosis present

## 2015-07-08 DIAGNOSIS — I739 Peripheral vascular disease, unspecified: Secondary | ICD-10-CM | POA: Diagnosis present

## 2015-07-08 DIAGNOSIS — J189 Pneumonia, unspecified organism: Secondary | ICD-10-CM | POA: Diagnosis not present

## 2015-07-08 DIAGNOSIS — R0902 Hypoxemia: Secondary | ICD-10-CM | POA: Diagnosis not present

## 2015-07-08 DIAGNOSIS — W010XXA Fall on same level from slipping, tripping and stumbling without subsequent striking against object, initial encounter: Secondary | ICD-10-CM | POA: Diagnosis present

## 2015-07-08 DIAGNOSIS — R531 Weakness: Secondary | ICD-10-CM | POA: Diagnosis not present

## 2015-07-08 DIAGNOSIS — E119 Type 2 diabetes mellitus without complications: Secondary | ICD-10-CM | POA: Diagnosis present

## 2015-07-08 DIAGNOSIS — I471 Supraventricular tachycardia: Secondary | ICD-10-CM | POA: Diagnosis present

## 2015-07-08 DIAGNOSIS — Z6827 Body mass index (BMI) 27.0-27.9, adult: Secondary | ICD-10-CM

## 2015-07-08 DIAGNOSIS — Z87891 Personal history of nicotine dependence: Secondary | ICD-10-CM | POA: Diagnosis not present

## 2015-07-08 DIAGNOSIS — I4719 Other supraventricular tachycardia: Secondary | ICD-10-CM | POA: Diagnosis present

## 2015-07-08 DIAGNOSIS — I872 Venous insufficiency (chronic) (peripheral): Secondary | ICD-10-CM | POA: Diagnosis present

## 2015-07-08 DIAGNOSIS — I350 Nonrheumatic aortic (valve) stenosis: Secondary | ICD-10-CM

## 2015-07-08 DIAGNOSIS — Z8249 Family history of ischemic heart disease and other diseases of the circulatory system: Secondary | ICD-10-CM

## 2015-07-08 DIAGNOSIS — I5033 Acute on chronic diastolic (congestive) heart failure: Secondary | ICD-10-CM | POA: Diagnosis present

## 2015-07-08 DIAGNOSIS — Y95 Nosocomial condition: Secondary | ICD-10-CM | POA: Diagnosis present

## 2015-07-08 DIAGNOSIS — Z79899 Other long term (current) drug therapy: Secondary | ICD-10-CM | POA: Diagnosis not present

## 2015-07-08 DIAGNOSIS — I150 Renovascular hypertension: Secondary | ICD-10-CM | POA: Diagnosis not present

## 2015-07-08 DIAGNOSIS — L97519 Non-pressure chronic ulcer of other part of right foot with unspecified severity: Secondary | ICD-10-CM | POA: Diagnosis present

## 2015-07-08 DIAGNOSIS — E876 Hypokalemia: Secondary | ICD-10-CM | POA: Diagnosis present

## 2015-07-08 DIAGNOSIS — E440 Moderate protein-calorie malnutrition: Secondary | ICD-10-CM | POA: Diagnosis present

## 2015-07-08 DIAGNOSIS — E1121 Type 2 diabetes mellitus with diabetic nephropathy: Secondary | ICD-10-CM | POA: Diagnosis not present

## 2015-07-08 LAB — CG4 I-STAT (LACTIC ACID): Lactic Acid, Venous: 1.54 mmol/L (ref 0.5–2.0)

## 2015-07-08 LAB — CBC WITH DIFFERENTIAL/PLATELET
Basophils Absolute: 0 10*3/uL (ref 0.0–0.1)
Basophils Relative: 0 % (ref 0–1)
Eosinophils Absolute: 0 10*3/uL (ref 0.0–0.7)
Eosinophils Relative: 0 % (ref 0–5)
HCT: 41.6 % (ref 39.0–52.0)
Hemoglobin: 13.4 g/dL (ref 13.0–17.0)
LYMPHS ABS: 0.6 10*3/uL — AB (ref 0.7–4.0)
Lymphocytes Relative: 4 % — ABNORMAL LOW (ref 12–46)
MCH: 26.6 pg (ref 26.0–34.0)
MCHC: 32.2 g/dL (ref 30.0–36.0)
MCV: 82.7 fL (ref 78.0–100.0)
MONOS PCT: 4 % (ref 3–12)
Monocytes Absolute: 0.6 10*3/uL (ref 0.1–1.0)
Neutro Abs: 16.3 10*3/uL — ABNORMAL HIGH (ref 1.7–7.7)
Neutrophils Relative %: 92 % — ABNORMAL HIGH (ref 43–77)
Platelets: 173 10*3/uL (ref 150–400)
RBC: 5.03 MIL/uL (ref 4.22–5.81)
RDW: 15.3 % (ref 11.5–15.5)
WBC: 17.6 10*3/uL — AB (ref 4.0–10.5)

## 2015-07-08 LAB — URINE MICROSCOPIC-ADD ON

## 2015-07-08 LAB — CBC
HCT: 39.3 % (ref 39.0–52.0)
HEMOGLOBIN: 12.6 g/dL — AB (ref 13.0–17.0)
MCH: 26.6 pg (ref 26.0–34.0)
MCHC: 32.1 g/dL (ref 30.0–36.0)
MCV: 83.1 fL (ref 78.0–100.0)
Platelets: 160 10*3/uL (ref 150–400)
RBC: 4.73 MIL/uL (ref 4.22–5.81)
RDW: 15.5 % (ref 11.5–15.5)
WBC: 20.4 10*3/uL — ABNORMAL HIGH (ref 4.0–10.5)

## 2015-07-08 LAB — COMPREHENSIVE METABOLIC PANEL
ALBUMIN: 3.2 g/dL — AB (ref 3.5–5.0)
ALT: 16 U/L — ABNORMAL LOW (ref 17–63)
AST: 20 U/L (ref 15–41)
Alkaline Phosphatase: 75 U/L (ref 38–126)
Anion gap: 13 (ref 5–15)
BUN: 32 mg/dL — AB (ref 6–20)
CHLORIDE: 95 mmol/L — AB (ref 101–111)
CO2: 26 mmol/L (ref 22–32)
Calcium: 8.6 mg/dL — ABNORMAL LOW (ref 8.9–10.3)
Creatinine, Ser: 1.97 mg/dL — ABNORMAL HIGH (ref 0.61–1.24)
GFR calc non Af Amer: 29 mL/min — ABNORMAL LOW (ref >60)
GFR, EST AFRICAN AMERICAN: 34 mL/min — AB (ref >60)
Glucose, Bld: 188 mg/dL — ABNORMAL HIGH (ref 65–99)
Potassium: 4.2 mmol/L (ref 3.5–5.1)
SODIUM: 134 mmol/L — AB (ref 135–145)
Total Bilirubin: 0.8 mg/dL (ref 0.3–1.2)
Total Protein: 7.1 g/dL (ref 6.5–8.1)

## 2015-07-08 LAB — URINALYSIS, ROUTINE W REFLEX MICROSCOPIC
BILIRUBIN URINE: NEGATIVE
GLUCOSE, UA: NEGATIVE mg/dL
Hgb urine dipstick: NEGATIVE
Ketones, ur: NEGATIVE mg/dL
Nitrite: NEGATIVE
PH: 5.5 (ref 5.0–8.0)
Protein, ur: 30 mg/dL — AB
SPECIFIC GRAVITY, URINE: 1.014 (ref 1.005–1.030)
Urobilinogen, UA: 0.2 mg/dL (ref 0.0–1.0)

## 2015-07-08 LAB — BRAIN NATRIURETIC PEPTIDE: B Natriuretic Peptide: 148.9 pg/mL — ABNORMAL HIGH (ref 0.0–100.0)

## 2015-07-08 LAB — I-STAT CG4 LACTIC ACID, ED: LACTIC ACID, VENOUS: 2.56 mmol/L — AB (ref 0.5–2.0)

## 2015-07-08 LAB — CREATININE, SERUM
Creatinine, Ser: 1.93 mg/dL — ABNORMAL HIGH (ref 0.61–1.24)
GFR calc Af Amer: 35 mL/min — ABNORMAL LOW (ref >60)
GFR calc non Af Amer: 30 mL/min — ABNORMAL LOW (ref >60)

## 2015-07-08 LAB — TROPONIN I
TROPONIN I: 0.06 ng/mL — AB (ref <0.031)
Troponin I: 0.04 ng/mL — ABNORMAL HIGH (ref <0.031)

## 2015-07-08 MED ORDER — ENOXAPARIN SODIUM 40 MG/0.4ML ~~LOC~~ SOLN
40.0000 mg | Freq: Every day | SUBCUTANEOUS | Status: DC
  Administered 2015-07-08 – 2015-07-12 (×5): 40 mg via SUBCUTANEOUS
  Filled 2015-07-08 (×5): qty 0.4

## 2015-07-08 MED ORDER — PIPERACILLIN-TAZOBACTAM 3.375 G IVPB
3.3750 g | Freq: Three times a day (TID) | INTRAVENOUS | Status: DC
  Administered 2015-07-09 – 2015-07-11 (×8): 3.375 g via INTRAVENOUS
  Filled 2015-07-08 (×9): qty 50

## 2015-07-08 MED ORDER — SIMVASTATIN 40 MG PO TABS
40.0000 mg | ORAL_TABLET | Freq: Every day | ORAL | Status: DC
  Administered 2015-07-08 – 2015-07-09 (×2): 40 mg via ORAL
  Filled 2015-07-08 (×2): qty 1

## 2015-07-08 MED ORDER — VANCOMYCIN HCL IN DEXTROSE 1-5 GM/200ML-% IV SOLN
1000.0000 mg | Freq: Once | INTRAVENOUS | Status: AC
  Administered 2015-07-08: 1000 mg via INTRAVENOUS
  Filled 2015-07-08: qty 200

## 2015-07-08 MED ORDER — MOMETASONE FURO-FORMOTEROL FUM 100-5 MCG/ACT IN AERO
2.0000 | INHALATION_SPRAY | Freq: Two times a day (BID) | RESPIRATORY_TRACT | Status: DC
  Administered 2015-07-09 – 2015-07-13 (×9): 2 via RESPIRATORY_TRACT
  Filled 2015-07-08: qty 8.8

## 2015-07-08 MED ORDER — SODIUM CHLORIDE 0.9 % IJ SOLN
3.0000 mL | Freq: Two times a day (BID) | INTRAMUSCULAR | Status: DC
  Administered 2015-07-08 – 2015-07-13 (×7): 3 mL via INTRAVENOUS

## 2015-07-08 MED ORDER — LINAGLIPTIN 5 MG PO TABS
5.0000 mg | ORAL_TABLET | Freq: Every day | ORAL | Status: DC
  Administered 2015-07-08 – 2015-07-13 (×6): 5 mg via ORAL
  Filled 2015-07-08 (×6): qty 1

## 2015-07-08 MED ORDER — ACETAMINOPHEN 650 MG RE SUPP
RECTAL | Status: AC
  Filled 2015-07-08: qty 1

## 2015-07-08 MED ORDER — ASPIRIN EC 81 MG PO TBEC
81.0000 mg | DELAYED_RELEASE_TABLET | Freq: Every morning | ORAL | Status: DC
  Administered 2015-07-09 – 2015-07-13 (×5): 81 mg via ORAL
  Filled 2015-07-08 (×5): qty 1

## 2015-07-08 MED ORDER — VANCOMYCIN HCL IN DEXTROSE 1-5 GM/200ML-% IV SOLN
1000.0000 mg | INTRAVENOUS | Status: DC
  Administered 2015-07-09 – 2015-07-10 (×2): 1000 mg via INTRAVENOUS
  Filled 2015-07-08 (×3): qty 200

## 2015-07-08 MED ORDER — ONDANSETRON HCL 4 MG/2ML IJ SOLN: 4.0000 mg | Freq: Four times a day (QID) | INTRAMUSCULAR | Status: DC | PRN

## 2015-07-08 MED ORDER — LORAZEPAM 0.5 MG PO TABS
0.5000 mg | ORAL_TABLET | Freq: Three times a day (TID) | ORAL | Status: DC
  Administered 2015-07-08 – 2015-07-13 (×14): 0.5 mg via ORAL
  Filled 2015-07-08 (×15): qty 1

## 2015-07-08 MED ORDER — DILTIAZEM HCL ER COATED BEADS 180 MG PO CP24
360.0000 mg | ORAL_CAPSULE | Freq: Every day | ORAL | Status: DC
  Administered 2015-07-08 – 2015-07-11 (×4): 360 mg via ORAL
  Filled 2015-07-08 (×4): qty 2

## 2015-07-08 MED ORDER — ALBUTEROL SULFATE (2.5 MG/3ML) 0.083% IN NEBU: 2.5000 mg | INHALATION_SOLUTION | Freq: Four times a day (QID) | RESPIRATORY_TRACT | Status: DC | PRN

## 2015-07-08 MED ORDER — SODIUM CHLORIDE 0.9 % IV BOLUS (SEPSIS)
1000.0000 mL | INTRAVENOUS | Status: DC
  Administered 2015-07-08: 1000 mL via INTRAVENOUS

## 2015-07-08 MED ORDER — ONDANSETRON HCL 4 MG PO TABS: 4.0000 mg | ORAL_TABLET | Freq: Four times a day (QID) | ORAL | Status: DC | PRN

## 2015-07-08 MED ORDER — SODIUM CHLORIDE 0.9 % IV SOLN: 250.0000 mL | INTRAVENOUS | Status: DC | PRN

## 2015-07-08 MED ORDER — ACETAMINOPHEN 650 MG RE SUPP
650.0000 mg | Freq: Once | RECTAL | Status: AC
  Administered 2015-07-08: 650 mg via RECTAL

## 2015-07-08 MED ORDER — POLYETHYLENE GLYCOL 3350 17 G PO PACK: 17.0000 g | PACK | Freq: Every day | ORAL | Status: DC | PRN

## 2015-07-08 MED ORDER — LEVOTHYROXINE SODIUM 112 MCG PO TABS
112.0000 ug | ORAL_TABLET | Freq: Every day | ORAL | Status: DC
  Administered 2015-07-09 – 2015-07-13 (×5): 112 ug via ORAL
  Filled 2015-07-08 (×5): qty 1

## 2015-07-08 MED ORDER — SODIUM CHLORIDE 0.9 % IJ SOLN: 3.0000 mL | INTRAMUSCULAR | Status: DC | PRN

## 2015-07-08 MED ORDER — QUETIAPINE FUMARATE 25 MG PO TABS
25.0000 mg | ORAL_TABLET | Freq: Every day | ORAL | Status: DC
  Administered 2015-07-08 – 2015-07-12 (×5): 25 mg via ORAL
  Filled 2015-07-08 (×5): qty 1

## 2015-07-08 MED ORDER — CARBIDOPA-LEVODOPA 10-100 MG PO TABS
1.0000 | ORAL_TABLET | Freq: Every evening | ORAL | Status: DC
  Administered 2015-07-08 – 2015-07-12 (×5): 1 via ORAL
  Filled 2015-07-08 (×6): qty 1

## 2015-07-08 MED ORDER — OXYCODONE HCL 5 MG PO TABS
10.0000 mg | ORAL_TABLET | ORAL | Status: DC | PRN
  Administered 2015-07-09 – 2015-07-13 (×11): 10 mg via ORAL
  Filled 2015-07-08 (×11): qty 2

## 2015-07-08 MED ORDER — ALUM & MAG HYDROXIDE-SIMETH 200-200-20 MG/5ML PO SUSP
30.0000 mL | Freq: Four times a day (QID) | ORAL | Status: DC | PRN
  Administered 2015-07-11: 30 mL via ORAL
  Filled 2015-07-08: qty 30

## 2015-07-08 MED ORDER — FUROSEMIDE 40 MG PO TABS
80.0000 mg | ORAL_TABLET | Freq: Every day | ORAL | Status: DC
  Administered 2015-07-09: 80 mg via ORAL
  Filled 2015-07-08: qty 2

## 2015-07-08 MED ORDER — SERTRALINE HCL 100 MG PO TABS
100.0000 mg | ORAL_TABLET | Freq: Every evening | ORAL | Status: DC
  Administered 2015-07-08 – 2015-07-12 (×5): 100 mg via ORAL
  Filled 2015-07-08 (×5): qty 1

## 2015-07-08 MED ORDER — PIPERACILLIN-TAZOBACTAM 3.375 G IVPB
3.3750 g | Freq: Once | INTRAVENOUS | Status: AC
  Administered 2015-07-08: 3.375 g via INTRAVENOUS
  Filled 2015-07-08: qty 50

## 2015-07-08 NOTE — Progress Notes (Signed)
CSW attempted to speak with patient at bedside. However, nurse is at bedside conducting procedure.   CSW will attempt to visit pt later.  Willette Brace [phone] ED CSW 07/08/2015 6:33 PM

## 2015-07-08 NOTE — H&P (Signed)
[facility]  History and Physical [name] L. Ardeth Perfect, MD Pager [phone] (if 7P to 7A, page night hospitalist on amion.[name] UMP:[id number] DOB: 1929/05/14 DOA: 07/08/2015  Referring physician: EDP PCP: Bruce Reel, MD   Chief Complaint: increasing weakness and falls   HPI:  81 year old Caucasian male with a past medical history of COPD, peripheral vascular disease, chronic lower extremity venous stasis with chronic heel wound, chronic kidney disease stage III. He returns after discharge 4 days prior due to increasing falls and weakness. Found to have sepsis 2/2 HCAP based on fever, leukocytosis and PNA on CXR . In ED he was treated w/ IV vanc/zosyn and IVF . He had leukocytosis , fever to 103, hypoxia to 83% on RA, lactic acidosis, borderline trop from demand ischemia, and BNP of 148. CXR showed diffuse edema. He will be admitted to treat sepsis 2/2 HCAP .  holding on IVF given hx of severe AS and concern for volume overload .  Chart Review:  Prior admission notes, current ED notes, labs, images   Review of Systems:  Neg except as noted above   Past Medical History  Diagnosis Date  . Hyperlipidemia   . COPD (chronic obstructive pulmonary disease)   . Leg pain   . GERD (gastroesophageal reflux disease)   . Depression   . Dizziness   . Productive cough   . Thyroid disease   . Peripheral vascular disease   . ED (erectile dysfunction)   . Hypertension     08/17/12 - wife denies  . Walking pneumonia 2000's  . H/O hiatal hernia   . Migraine     "when I was a young boy"  . Arthritis     "hands" (08/28/2014)  . Anxiety   . Hypothyroidism   . Chronic kidney disease (CKD), stage III (moderate)     [name] 08/28/2014    Past Surgical History  Procedure Laterality Date  . Renal artery stent Left   . Kidney stone surgery  1989  . Lower extremity angiogram Left 03-20-2014    Dr. Tinnie Gens  . Tonsillectomy    . Cataract extraction, bilateral Bilateral   .  Blepharoplasty Right   . Abdominal aortagram N/A 03/20/2013    Procedure: ABDOMINAL Maxcine Ham;  Surgeon: [name], MD;  Location: [facility] CATH LAB;  Service: Cardiovascular;  Laterality: N/A;  . Lower extremity angiogram Left 03/20/2013    Procedure: LOWER EXTREMITY ANGIOGRAM;  Surgeon: [name], MD;  Location: [facility] CATH LAB;  Service: Cardiovascular;  Laterality: Left;    Social History:  reports that he quit smoking about 2 years ago. His smoking use included Cigarettes. He has a 25 pack-year smoking history. He has never used smokeless tobacco. He reports that he drinks alcohol. He reports that he does not use illicit drugs.  No Known Allergies  Family History  Problem Relation Age of Onset  . Other Mother     AAA  . AAA (abdominal aortic aneurysm) Mother   . Heart attack Father   . Alcohol abuse Father   . Diabetes Father   . Other Father     amputation  . Diabetes Brother   . Coronary artery disease Brother   . Dementia Brother      Prior to Admission medications   Medication Sig Start Date End Date Taking? Authorizing Provider  acetaminophen (TYLENOL) 325 MG tablet Take 650 mg by mouth every 6 (six) hours as needed. For pain or fever    Historical  Provider, MD  albuterol (PROVENTIL) (5 MG/ML) 0.5% nebulizer solution Take 2.5 mg by nebulization every 6 (six) hours as needed. For shortness of breath    Historical Provider, MD  amoxicillin (AMOXIL) 500 MG capsule Take 1 capsule (500 mg total) by mouth every 12 (twelve) hours. 07/04/15   Bonnielee Haff, MD  aspirin EC 81 MG tablet Take 81 mg by mouth every morning.     Historical Provider, MD  carbidopa-levodopa (SINEMET) 10-100 MG per tablet Take 1 tablet by mouth every evening.     Historical Provider, MD  diltiazem (CARDIZEM CD) 360 MG 24 hr capsule Take 1 capsule (360 mg total) by mouth daily. 10/09/14   Shon Baton, MD  feeding supplement (ENSURE) PUDG Take 1 Container by mouth 3 (three) times daily between meals. 08/23/12    Shon Baton, MD  Fluticasone-Salmeterol (ADVAIR) 250-50 MCG/DOSE AEPB Inhale 1-2 puffs into the lungs 2 (two) times daily as needed (for wheezing.).    Historical Provider, MD  furosemide (LASIX) 80 MG tablet Take 1 tablet (80 mg total) by mouth daily. 07/04/15   Bonnielee Haff, MD  gabapentin (NEURONTIN) 300 MG capsule Take 1 capsule (300 mg total) by mouth 2 (two) to 3 (three)  times daily. 09/02/14   Shon Baton, MD  ipratropium (ATROVENT) 0.02 % nebulizer solution Take 500 mcg by nebulization every 6 (six) hours as needed. For shortness of breath    Historical Provider, MD  levothyroxine (SYNTHROID, LEVOTHROID) 112 MCG tablet Take 112 mcg by mouth every morning.     Historical Provider, MD  linagliptin (TRADJENTA) 5 MG TABS tablet Take 1 tablet (5 mg total) by mouth daily. 10/09/14   Shon Baton, MD  LORazepam (ATIVAN) 0.5 MG tablet Take 1 tablet by mouth 3 (three) times daily. 06/02/15   Historical Provider, MD  Melatonin-Pyridoxine (MELATIN PO) Take 1 tablet by mouth at bedtime as needed (sleep).    Historical Provider, MD  Oxycodone HCl 10 MG TABS Take 1 tablet by mouth every 4 (four) hours as needed. 06/20/15   Historical Provider, MD  potassium chloride SA (K-DUR,KLOR-CON) 20 MEQ tablet Take 2 tablets (40 mEq total) by mouth once. 07/04/15   Bonnielee Haff, MD  QUEtiapine (SEROQUEL) 25 MG tablet Take 1 tablet (25 mg total) by mouth at bedtime. May take 1-2 during the day as needed. 10/09/14   Shon Baton, MD  sertraline (ZOLOFT) 100 MG tablet Take 100 mg by mouth every evening.     Historical Provider, MD  simvastatin (ZOCOR) 40 MG tablet Take 40 mg by mouth at bedtime.     Historical Provider, MD   Physical Exam: Filed Vitals:   07/08/15 1900 07/08/15 1915 07/08/15 1946 07/08/15 1951  BP: 132/59 152/59  138/64  Pulse: 99 99  108  Temp:   101.6 F (38.7 C)   TempSrc:   Rectal   Resp: 22 23  23   Height:      Weight:      SpO2: 89% 89%  90%     General:  WM in NAD   HEENT: dry MM ,  anicteric   Cardiovascular: RRR, no mrg   Respiratory: ctab, diffuse crackles preset   Abdomen: soft, mildly TTP , no rebound or guarding   Skin: dry, has bandages on B/L LE to mid shin   Musculoskeletal: weakness of B/L LE at the hip   Psychiatric: no signs of anxiety or depression  Neurologic: not oriented to month or situation   Wt Readings from Last 3  Encounters:  07/08/15 202 lb (91.627 kg)  07/04/15 207 lb 12.8 oz (94.257 kg)  11/25/14 234 lb (106.142 kg)    Labs on Admission:  Basic Metabolic Panel:  Recent Labs Lab 07/02/15 0250 07/03/15 0333 07/04/15 0325 07/08/15 1835  NA 136 134* 136 134*  K 4.8 3.4* 3.3* 4.2  CL 103 98* 97* 95*  CO2 20* 26 31 26   GLUCOSE 171* 115* 112* 188*  BUN 25* 28* 24* 32*  CREATININE 2.11* 1.86* 1.60* 1.97*  CALCIUM 8.5* 8.1* 8.4* 8.6*    Liver Function Tests:  Recent Labs Lab 07/08/15 1835  AST 20  ALT 16*  ALKPHOS 75  BILITOT 0.8  PROT 7.1  ALBUMIN 3.2*   No results for input(s): LIPASE, AMYLASE in the last 168 hours. No results for input(s): AMMONIA in the last 168 hours.  CBC:  Recent Labs Lab 07/02/15 0250 07/03/15 0333 07/08/15 1835  WBC 14.3* 10.9* 17.6*  NEUTROABS  --   --  16.3*  HGB 13.2 11.6* 13.4  HCT 40.0 35.6* 41.6  MCV 82.3 80.9 82.7  PLT 124* 121* 173    Cardiac Enzymes:  Recent Labs Lab 07/02/15 0912 07/02/15 1350 07/02/15 1932 07/08/15 1835  TROPONINI 0.03 0.03 0.03 0.04*    Troponin (Point of Care Test) No results for input(s): TROPIPOC in the last 72 hours.  BNP (last 3 results)  Recent Labs  10/05/14 1555  PROBNP 5498.0*    CBG:  Recent Labs Lab 07/03/15 1109 07/03/15 1621 07/03/15 2132 07/04/15 0556 07/04/15 1120  GLUCAP 101* 147* 105* 143* 116*     Radiological Exams on Admission: Dg Chest Port 1 View  07/08/2015   CLINICAL DATA:  Increased shortness of breath and weakness today. COPD.  EXAM: PORTABLE CHEST - 1 VIEW  COMPARISON:  07/03/2015  FINDINGS:  Cardiomegaly stable. Diffuse interstitial prominence and bibasilar airspace disease shows no significant change, suspicious for diffuse pulmonary edema. Small bilateral pleural effusions cannot be excluded. No evidence of pneumothorax.  IMPRESSION: Stable cardiomegaly and symmetric interstitial and bibasilar airspace disease, suspicious for diffuse pulmonary edema/congestive heart failure. Probable small bilateral pleural effusions.   Electronically Signed   By: Earle Gell M.D.   On: 07/08/2015 19:08       Active Problems:   Sepsis due to pneumonia   Assessment/Plan  1 . Sepsis 2/2 HCAP -  Vanc, zosyn . Holding on IVF given hx of volume overload and elevated BNP . Cultures pending . Repeat lactate and trop pending   2 . Hypoxic resp failure - 2/2 HCAP. On supplemental O2  3. B/L LE wounds - wound consult placed  4. Severe AS on TTE 07/03/15 - being cautious w/ IVF. I/O. Daily weights  5. Demand ischemia - trending troponin  Code Status: full code Family Communication: none  Disposition Plan/Anticipated LOS: 3-5 days   Time spent: 66 minutes  Velna Hatchet, MD  Internal Medicine Pager [phone] If 7PM-7AM, please contact night-coverage at [email or web address], password [facility] 07/08/2015, 8:24 PM

## 2015-07-08 NOTE — ED Notes (Addendum)
Per EMS wife request pt evaluation for fall last week related to decrease/difficulty with ambulation since fall; pt denies complaints or pain at presents time. Pt alert and oriented x3 (disoriented to time) per normal.  Pt took 3 flexeril and 3 hydrocodone at 1100.

## 2015-07-08 NOTE — ED Notes (Signed)
Bed: WA21 Expected date:  Expected time:  Means of arrival:  Comments: EMS  

## 2015-07-08 NOTE — ED Notes (Signed)
[name], EDP made aware of patient CG4 lactic result.

## 2015-07-08 NOTE — Progress Notes (Signed)
ANTIBIOTIC CONSULT NOTE - INITIAL  Pharmacy Consult for vancomycin and zosyn Indication: sepsis and pna  No Known Allergies  Patient Measurements: Height: 6' (182.9 cm) Weight: 206 lb 3.2 oz (93.532 kg) IBW/kg (Calculated) : 77.6   Vital Signs: Temp: 98.8 F (37.1 C) (07/20 2156) Temp Source: Oral (07/20 2156) BP: 145/62 mmHg (07/20 2156) Pulse Rate: 80 (07/20 2156) Intake/Output from previous day:   Intake/Output from this shift: Total I/O In: 750 [IV Piggyback:750] Out: -   Labs:  Recent Labs  07/08/15 1835  WBC 17.6*  HGB 13.4  PLT 173  CREATININE 1.97*   Estimated Creatinine Clearance: 33.2 mL/min (by C-G formula based on Cr of 1.97). No results for input(s): VANCOTROUGH, VANCOPEAK, VANCORANDOM, GENTTROUGH, GENTPEAK, GENTRANDOM, TOBRATROUGH, TOBRAPEAK, TOBRARND, AMIKACINPEAK, AMIKACINTROU, AMIKACIN in the last 72 hours.   Microbiology: Recent Results (from the past 720 hour(s))  Urine culture     Status: None   Collection Time: 07/02/15  4:40 AM  Result Value Ref Range Status   Specimen Description URINE, CATHETERIZED  Final   Special Requests NONE  Final   Culture NO GROWTH 1 DAY  Final   Report Status 07/03/2015 FINAL  Final  Blood culture (routine x 2)     Status: None   Collection Time: 07/02/15  5:25 AM  Result Value Ref Range Status   Specimen Description BLOOD FOREARM RIGHT  Final   Special Requests BOTTLES DRAWN AEROBIC AND ANAEROBIC 5CC  Final   Culture NO GROWTH 5 DAYS  Final   Report Status 07/07/2015 FINAL  Final  Blood culture (routine x 2)     Status: None   Collection Time: 07/02/15  5:30 AM  Result Value Ref Range Status   Specimen Description BLOOD HAND RIGHT  Final   Special Requests BOTTLES DRAWN AEROBIC AND ANAEROBIC 5CC  Final   Culture NO GROWTH 5 DAYS  Final   Report Status 07/07/2015 FINAL  Final    Medical History: Past Medical History  Diagnosis Date  . Hyperlipidemia   . COPD (chronic obstructive pulmonary disease)    . Leg pain   . GERD (gastroesophageal reflux disease)   . Depression   . Dizziness   . Productive cough   . Thyroid disease   . Peripheral vascular disease   . ED (erectile dysfunction)   . Hypertension     08/17/12 - wife denies  . Walking pneumonia 2000's  . H/O hiatal hernia   . Migraine     "when I was a young boy"  . Arthritis     "hands" (08/28/2014)  . Anxiety   . Hypothyroidism   . Chronic kidney disease (CKD), stage III (moderate)     /notes 08/28/2014     Assessment: 81 year old Caucasian male with a past medical history of COPD, peripheral vascular disease, chronic lower extremity venous stasis with chronic heel wound, chronic kidney disease stage III. He returns after discharge 4 days prior due to increasing falls and weakness. Found to have sepsis 2/2 HCAP based on fever, leukocytosis and PNA on CXR . In ED he was treated w/ IV vanc/zosyn and IVF.  Pharmacy consulted to dose vanc and zosyn for sepsis 2/2 HCAP.  Goal of Therapy:  Vancomycin trough level 15-20 mcg/ml vanc and zosyn per renal function  Plan:  Vancomycin 1gm IV q24h Zosyn 3.375mg  IV q8h extended interval Follow renal function, cultures, clinical course vanc trough as needed  Dolly Rias RPh 07/08/2015, 10:11 PM Pager [phone]

## 2015-07-08 NOTE — ED Provider Notes (Signed)
CSN: [id number]     Arrival date & time 07/08/15  1746 History   First MD Initiated Contact with Patient 07/08/15 1801     Chief Complaint  Patient presents with  . Fall     (Consider location/radiation/quality/duration/timing/severity/associated sxs/prior Treatment) HPI Comments: Patient here from home with increased weakness times several days. Seen here recently for respiratory failure and discharged 4 days ago. Has been having increasing falls since then. Notes increased cough and congestion. No vomiting or diarrhea. No urinary symptoms. EMS called and patient was febrile with pulse oximetry in the 80s. Was placed on oxygen and transported here. Due to his condition of further history obtainable.  Patient is a 81 y.o. male presenting with fall. The history is provided by the patient. The history is limited by the condition of the patient.  Fall    Past Medical History  Diagnosis Date  . Hyperlipidemia   . COPD (chronic obstructive pulmonary disease)   . Leg pain   . GERD (gastroesophageal reflux disease)   . Depression   . Dizziness   . Productive cough   . Thyroid disease   . Peripheral vascular disease   . ED (erectile dysfunction)   . Hypertension     08/17/12 - wife denies  . Walking pneumonia 2000's  . H/O hiatal hernia   . Migraine     "when I was a young boy"  . Arthritis     "hands" (08/28/2014)  . Anxiety   . Hypothyroidism   . Chronic kidney disease (CKD), stage III (moderate)     [name] 08/28/2014   Past Surgical History  Procedure Laterality Date  . Renal artery stent Left   . Kidney stone surgery  1989  . Lower extremity angiogram Left 03-20-2014    Dr. Tinnie Gens  . Tonsillectomy    . Cataract extraction, bilateral Bilateral   . Blepharoplasty Right   . Abdominal aortagram N/A 03/20/2013    Procedure: ABDOMINAL Maxcine Ham;  Surgeon: [name], MD;  Location: [facility] CATH LAB;  Service: Cardiovascular;  Laterality: N/A;  . Lower extremity angiogram  Left 03/20/2013    Procedure: LOWER EXTREMITY ANGIOGRAM;  Surgeon: [name], MD;  Location: [facility] CATH LAB;  Service: Cardiovascular;  Laterality: Left;   Family History  Problem Relation Age of Onset  . Other Mother     AAA  . AAA (abdominal aortic aneurysm) Mother   . Heart attack Father   . Alcohol abuse Father   . Diabetes Father   . Other Father     amputation  . Diabetes Brother   . Coronary artery disease Brother   . Dementia Brother    History  Substance Use Topics  . Smoking status: Former Smoker -- 1.00 packs/day for 25 years    Types: Cigarettes    Quit date: 08/17/2012  . Smokeless tobacco: Never Used  . Alcohol Use: 0.0 oz/week    0 Standard drinks or equivalent per week     Comment: 08/28/2014 "last drink was in ~ 2012"    Review of Systems  Unable to perform ROS     Allergies  Review of patient's allergies indicates no known allergies.  Home Medications   Prior to Admission medications   Medication Sig Start Date End Date Taking? Authorizing Provider  acetaminophen (TYLENOL) 325 MG tablet Take 650 mg by mouth every 6 (six) hours as needed. For pain or fever    Historical Provider, MD  albuterol (PROVENTIL) (5 MG/ML) 0.5% nebulizer  solution Take 2.5 mg by nebulization every 6 (six) hours as needed. For shortness of breath    Historical Provider, MD  amoxicillin (AMOXIL) 500 MG capsule Take 1 capsule (500 mg total) by mouth every 12 (twelve) hours. 07/04/15   Bonnielee Haff, MD  aspirin EC 81 MG tablet Take 81 mg by mouth every morning.     Historical Provider, MD  carbidopa-levodopa (SINEMET) 10-100 MG per tablet Take 1 tablet by mouth every evening.     Historical Provider, MD  diltiazem (CARDIZEM CD) 360 MG 24 hr capsule Take 1 capsule (360 mg total) by mouth daily. 10/09/14   Shon Baton, MD  feeding supplement (ENSURE) PUDG Take 1 Container by mouth 3 (three) times daily between meals. 08/23/12   Shon Baton, MD  Fluticasone-Salmeterol (ADVAIR) 250-50  MCG/DOSE AEPB Inhale 1-2 puffs into the lungs 2 (two) times daily as needed (for wheezing.).    Historical Provider, MD  furosemide (LASIX) 80 MG tablet Take 1 tablet (80 mg total) by mouth daily. 07/04/15   Bonnielee Haff, MD  gabapentin (NEURONTIN) 300 MG capsule Take 1 capsule (300 mg total) by mouth 2 (two) to 3 (three)  times daily. 09/02/14   Shon Baton, MD  ipratropium (ATROVENT) 0.02 % nebulizer solution Take 500 mcg by nebulization every 6 (six) hours as needed. For shortness of breath    Historical Provider, MD  levothyroxine (SYNTHROID, LEVOTHROID) 112 MCG tablet Take 112 mcg by mouth every morning.     Historical Provider, MD  linagliptin (TRADJENTA) 5 MG TABS tablet Take 1 tablet (5 mg total) by mouth daily. 10/09/14   Shon Baton, MD  LORazepam (ATIVAN) 0.5 MG tablet Take 1 tablet by mouth 3 (three) times daily. 06/02/15   Historical Provider, MD  Melatonin-Pyridoxine (MELATIN PO) Take 1 tablet by mouth at bedtime as needed (sleep).    Historical Provider, MD  Oxycodone HCl 10 MG TABS Take 1 tablet by mouth every 4 (four) hours as needed. 06/20/15   Historical Provider, MD  potassium chloride SA (K-DUR,KLOR-CON) 20 MEQ tablet Take 2 tablets (40 mEq total) by mouth once. 07/04/15   Bonnielee Haff, MD  QUEtiapine (SEROQUEL) 25 MG tablet Take 1 tablet (25 mg total) by mouth at bedtime. May take 1-2 during the day as needed. 10/09/14   Shon Baton, MD  sertraline (ZOLOFT) 100 MG tablet Take 100 mg by mouth every evening.     Historical Provider, MD  simvastatin (ZOCOR) 40 MG tablet Take 40 mg by mouth at bedtime.     Historical Provider, MD   BP 159/75 mmHg  Pulse 119  Temp(Src) 103.5 F (39.7 C) (Rectal)  Resp 24  SpO2 88% Physical Exam  Constitutional: He appears well-developed and well-nourished. He appears lethargic.  Non-toxic appearance. No distress.  HENT:  Head: Normocephalic and atraumatic.  Eyes: Conjunctivae, EOM and lids are normal. Pupils are equal, round, and reactive to  light.  Neck: Normal range of motion. Neck supple. No tracheal deviation present. No thyroid mass present.  Cardiovascular: Regular rhythm and normal heart sounds.  Tachycardia present.  Exam reveals no gallop.   No murmur heard. Pulmonary/Chest: Effort normal. No stridor. No respiratory distress. He has decreased breath sounds. He has wheezes. He has no rhonchi. He has no rales.  Abdominal: Soft. Normal appearance and bowel sounds are normal. He exhibits no distension. There is no tenderness. There is no rebound and no CVA tenderness.  Musculoskeletal: Normal range of motion. He exhibits no edema or tenderness.  3+  bilateral lower extremity pitting edema  Neurological: He appears lethargic. No cranial nerve deficit or sensory deficit. GCS eye subscore is 4. GCS verbal subscore is 5. GCS motor subscore is 6.  Skin: Skin is warm and dry. No abrasion and no rash noted.  Psychiatric: His affect is blunt. His speech is delayed. He is slowed.  Nursing note and vitals reviewed.   ED Course  Procedures (including critical care time) Labs Review Labs Reviewed  CULTURE, BLOOD (ROUTINE X 2)  CULTURE, BLOOD (ROUTINE X 2)  URINE CULTURE  COMPREHENSIVE METABOLIC PANEL  CBC WITH DIFFERENTIAL/PLATELET  URINALYSIS, ROUTINE W REFLEX MICROSCOPIC (NOT AT [facility])  TROPONIN I  BRAIN NATRIURETIC PEPTIDE  I-STAT CG4 LACTIC ACID, ED    Imaging Review No results found.   EKG Interpretation   Date/Time:  Wednesday July 08 2015 18:05:21 EDT Ventricular Rate:  117 PR Interval:  108 QRS Duration: 143 QT Interval:  366 QTC Calculation: 511 R Axis:   -78 Text Interpretation:  Sinus tachycardia with irregular rate Right bundle  branch block Left ventricular hypertrophy Inferior infarct, old Lateral  leads are also involved Confirmed by Seira Cody  MD, Cadynce Garrette ([id number]) on  07/08/2015 6:10:43 PM      MDM   Final diagnoses:  None    Patient started on IV antibiotics for hcap. Given Tylenol for fever. Will  be admitted    Lacretia Leigh, MD 07/08/15 [phone]

## 2015-07-08 NOTE — Progress Notes (Signed)
Cm noted CM consult CM went to assess pt and walked in his room to find him attempting to remove oximetry and with the Nasal cannula tubing on his fore head Pt alert to person only Pt unable to tell CM place, time and situation Pt asked for Potty and when asked if Cm would help him use the urinal, he told CM "No, I have already wet myself" Cm noted pt's bed linens wet Cm spoke with ED CNA about pt incontinence who states she had recently assisted pt to use rest room  When CM asked pt if he had some one from home health go to see him at home, he said yes When CM asked if she could assist with ordered home health services Pt informed CM he "don't prefer any" Pt gave permission for family, "my wife" to be consulted

## 2015-07-09 DIAGNOSIS — I350 Nonrheumatic aortic (valve) stenosis: Secondary | ICD-10-CM

## 2015-07-09 DIAGNOSIS — A419 Sepsis, unspecified organism: Principal | ICD-10-CM

## 2015-07-09 DIAGNOSIS — F3131 Bipolar disorder, current episode depressed, mild: Secondary | ICD-10-CM

## 2015-07-09 DIAGNOSIS — I150 Renovascular hypertension: Secondary | ICD-10-CM

## 2015-07-09 DIAGNOSIS — M869 Osteomyelitis, unspecified: Secondary | ICD-10-CM

## 2015-07-09 DIAGNOSIS — M9080 Osteopathy in diseases classified elsewhere, unspecified site: Secondary | ICD-10-CM

## 2015-07-09 DIAGNOSIS — J189 Pneumonia, unspecified organism: Secondary | ICD-10-CM

## 2015-07-09 DIAGNOSIS — E1169 Type 2 diabetes mellitus with other specified complication: Secondary | ICD-10-CM

## 2015-07-09 DIAGNOSIS — I471 Supraventricular tachycardia: Secondary | ICD-10-CM

## 2015-07-09 DIAGNOSIS — E1121 Type 2 diabetes mellitus with diabetic nephropathy: Secondary | ICD-10-CM

## 2015-07-09 DIAGNOSIS — I5032 Chronic diastolic (congestive) heart failure: Secondary | ICD-10-CM | POA: Diagnosis present

## 2015-07-09 LAB — CBC
HEMATOCRIT: 39.2 % (ref 39.0–52.0)
HEMOGLOBIN: 12.5 g/dL — AB (ref 13.0–17.0)
MCH: 26.2 pg (ref 26.0–34.0)
MCHC: 31.9 g/dL (ref 30.0–36.0)
MCV: 82 fL (ref 78.0–100.0)
Platelets: 148 10*3/uL — ABNORMAL LOW (ref 150–400)
RBC: 4.78 MIL/uL (ref 4.22–5.81)
RDW: 15.3 % (ref 11.5–15.5)
WBC: 16.7 10*3/uL — ABNORMAL HIGH (ref 4.0–10.5)

## 2015-07-09 LAB — BRAIN NATRIURETIC PEPTIDE: B Natriuretic Peptide: 215.9 pg/mL — ABNORMAL HIGH (ref 0.0–100.0)

## 2015-07-09 LAB — BASIC METABOLIC PANEL
ANION GAP: 9 (ref 5–15)
BUN: 33 mg/dL — AB (ref 6–20)
CALCIUM: 8.5 mg/dL — AB (ref 8.9–10.3)
CHLORIDE: 98 mmol/L — AB (ref 101–111)
CO2: 30 mmol/L (ref 22–32)
Creatinine, Ser: 1.96 mg/dL — ABNORMAL HIGH (ref 0.61–1.24)
GFR calc non Af Amer: 30 mL/min — ABNORMAL LOW (ref >60)
GFR, EST AFRICAN AMERICAN: 34 mL/min — AB (ref >60)
GLUCOSE: 149 mg/dL — AB (ref 65–99)
Potassium: 3.9 mmol/L (ref 3.5–5.1)
Sodium: 137 mmol/L (ref 135–145)

## 2015-07-09 LAB — TROPONIN I: Troponin I: 0.05 ng/mL — ABNORMAL HIGH (ref <0.031)

## 2015-07-09 MED ORDER — FUROSEMIDE 10 MG/ML IJ SOLN
40.0000 mg | Freq: Two times a day (BID) | INTRAMUSCULAR | Status: DC
  Administered 2015-07-09 – 2015-07-10 (×2): 40 mg via INTRAVENOUS
  Filled 2015-07-09 (×2): qty 4

## 2015-07-09 MED ORDER — PRO-STAT SUGAR FREE PO LIQD
30.0000 mL | Freq: Two times a day (BID) | ORAL | Status: DC
  Administered 2015-07-09 – 2015-07-13 (×8): 30 mL via ORAL
  Filled 2015-07-09 (×16): qty 30

## 2015-07-09 MED ORDER — ENSURE ENLIVE PO LIQD
237.0000 mL | Freq: Two times a day (BID) | ORAL | Status: DC
  Administered 2015-07-09 – 2015-07-13 (×7): 237 mL via ORAL

## 2015-07-09 NOTE — Consult Note (Addendum)
WOC wound consult note Pt familiar to [place] from recent admission; refer to progress notes on 7/14. Reason for Consult: Consult requested for [facility] boots. Pt has chronic venous stasis changes and wears Una boots prior to admission which he states are changed twice a week. Wound type: He also has a chronic full thickness wound to plantar heel. Measurement: Right plantar foot near posterior heel unchanged in appearance from last admission; 1.3X1.3X.5cm Wound bed: Red and moist Drainage (amount, consistency, odor) Small yellow drainage, no odor Periwound: Intact skin surrounding foot wound. Bilat legs and feet with generalized edema and patchy areas of red partial thickness wounds, dry skin with fissures near heels, none with significant depth or drainage. Dressing procedure/placement/frequency: Continue present plan of care with calcium alginate to right heel to absorb drainage and promote healing and paged ortho tech to apply bilat Una boots and coban; they can change on Tuesday. Pt can resume previous plan of care for dressing changes and Una boots after discharge. Discussed plan of care with patient and he verbalized understanding. Please re-consult if further assistance is needed. Thank-you,  Bruce Girt MSN, [place], [place], [place], [place]

## 2015-07-09 NOTE — Progress Notes (Addendum)
Pt active with [facility], will need resumption of orders.

## 2015-07-09 NOTE — Progress Notes (Signed)
Initial Nutrition Assessment  DOCUMENTATION CODES:   Non-severe (moderate) malnutrition in context of chronic illness  INTERVENTION:  - Will order Ensure Enlive BID, each supplement provides 350 kcal and 20 grams of protein - Will order Prostat BID, each supplement provides 100 kcal and 15 grams of protein - RD will continue to monitor for needs  NUTRITION DIAGNOSIS:   Predicted suboptimal nutrient intake related to chronic illness as evidenced by per patient/family report, meal completion < 50%.  GOAL:   Patient will meet greater than or equal to 90% of their needs  MONITOR:   PO intake, Supplement acceptance, Weight trends, Labs  REASON FOR ASSESSMENT:   Malnutrition Screening Tool  ASSESSMENT:  81 year old Caucasian male with a past medical history of COPD, peripheral vascular disease, chronic lower extremity venous stasis with chronic heel wound, chronic kidney disease stage III. He returns after discharge 4 days prior due to increasing falls and weakness. Found to have sepsis 2/2 HCAP based on fever, leukocytosis and PNA on CXR . In ED he was treated w/ IV vanc/zosyn and IVF . He had leukocytosis , fever to 103, hypoxia to 83% on RA, lactic acidosis, borderline trop from demand ischemia, and BNP of 148. CXR showed diffuse edema. He will be admitted to treat sepsis 2/2 HCAP . holding on IVF given hx of severe AS and concern for volume overload.  Pt seen for MST. BMI indicates overweight status. Pt ate <50% of breakfast tray; diet advanced from NPO to Dysphagia 3 at 0954 today. Helped pt determine how to order meals for subsequent meals.   He states PTA his appetite was decreased for "a long while." He indicates that he was drinking both Ensure and Boost PTA and is interested in receiving supplement during admission.  Pt states that he lost 50 lbs but is unsure of time frame for weight loss. Per weight hx review, pt has lost 38 lbs (15.6% body weight) in the past 8 months which  is significant for time frame. Mild to moderate muscle wasting noted to clavicle area.   Not meeting needs. Medications reviewed. Labs reviewed; Cl: 98 mmol/L, BUN/creatinine elevated, Ca: 8.5 mg/dL.   Diet Order:  DIET DYS 3 Room service appropriate?: Yes; Fluid consistency:: Thin  Skin:  Wound (see comment) (Stage 2 sacral pressure ulcer, Stage 1 bilateral buttocks pressure ulcers, R elbow wound)  Last BM:  PTA  Height:   Ht Readings from Last 1 Encounters:  07/08/15 6' (1.829 m)    Weight:   Wt Readings from Last 1 Encounters:  07/09/15 205 lb 9.6 oz (93.26 kg)    Ideal Body Weight:  80.91 kg (kg)  Wt Readings from Last 10 Encounters:  07/09/15 205 lb 9.6 oz (93.26 kg)  07/04/15 207 lb 12.8 oz (94.257 kg)  11/25/14 234 lb (106.142 kg)  10/28/14 243 lb 4.8 oz (110.36 kg)  10/09/14 233 lb 4.8 oz (105.824 kg)  09/09/14 238 lb (107.956 kg)  09/02/14 242 lb 4.6 oz (109.9 kg)  07/29/14 244 lb (110.678 kg)  04/22/14 241 lb 4.8 oz (109.453 kg)  03/20/13 230 lb (104.327 kg)    BMI:  Body mass index is 27.88 kg/(m^2).  Estimated Nutritional Needs:   Kcal:  1900-2100  Protein:  100-110 grams  Fluid:  2.2-2.5 L/day  EDUCATION NEEDS:   No education needs identified at this time     Jarome Matin, RD, LDN Inpatient Clinical Dietitian Pager # [phone] After hours/weekend pager # [phone]

## 2015-07-09 NOTE — Progress Notes (Signed)
Inpatient Diabetes Program Recommendations  AACE/ADA: New Consensus Statement on Inpatient Glycemic Control (2013)  Target Ranges:  Prepandial:   less than 140 mg/dL      Peak postprandial:   less than 180 mg/dL (1-2 hours)      Critically ill patients:  140 - 180 mg/dL   Results for KYRIE, FLUDD (MRN [id number]) as of 07/09/2015 18:15  Ref. Range 07/03/2015 11:09 07/03/2015 16:21 07/03/2015 21:32 07/04/2015 05:56 07/04/2015 11:20  Glucose-Capillary Latest Ref Range: 65-99 mg/dL 101 (H) 147 (H) 105 (H) 143 (H) 116 (H)   Type 2 DM on tradjenta 5 mg QD.  Inpatient Diabetes Program Recommendations Correction (SSI): Add Novolog sensitive tidwc  Note: Will follow. Thank you. Lorenda Peck, RD, LDN, CDE Inpatient Diabetes Coordinator [phone]

## 2015-07-09 NOTE — Progress Notes (Signed)
[name] DOB: 07-28-1929 DOA: 07/08/2015 PCP: [name], MD  Brief narrative: 81 y/o ? prior heavy smoker c COPDM, POVD + Occ SFA-Calcaneal Osteo c Group B strep Bacteremia/Septic shock-completed IV Abx Nov26/2016 and now on Suppressive Amoxicillin 500 bid [foll by Dr. Tommy Medal ID, Dr. Doran [facility]], CKD III, prior MAT c PVC, Sacral decub h/o, Morbid obesity, Body mass index is 27.88 kg/(m^2)., Mo AoS, RLS, Bipolar on meds  Past medical history-As per Problem list Chart reviewed as below-   Consultants:  None yet  Procedures:    Antibiotics:  Vanc   Zosyn   Subjective  Fair Feels a little better  Twells me has had cough and sputum over 1-2 days Came back due to SOB Tells me that the wound on the foot is "stable" Also is unclear as to dose of lasix "ask the doctor"   Objective    Interim History:   Telemetry:    Objective: Filed Vitals:   07/08/15 2156 07/09/15 0624 07/09/15 0940 07/09/15 0958  BP: 145/62 135/56  140/52  Pulse: 80 71    Temp: 98.8 F (37.1 C) 97.9 F (36.6 C)    TempSrc: Oral Oral    Resp: 20 20    Height: 6' (1.829 m)     Weight: 93.532 kg (206 lb 3.2 oz) 93.26 kg (205 lb 9.6 oz)    SpO2: 90% 94% 90%     Intake/Output Summary (Last 24 hours) at 07/09/15 1009 Last data filed at 07/09/15 0055  Gross per 24 hour  Intake    750 ml  Output    200 ml  Net    550 ml    Exam:  General: eomi ncat Cardiovascular: s1 s 2no m/r/g Respiratory: crackles bilaterally Abdomen: soft, non tedner non distended Skin le swelling bilaterally.  Wound on the Heel looks clean with no erythema and no slough Neuro intact  Data Reviewed: Basic Metabolic Panel:  Recent Labs Lab 07/03/15 0333 07/04/15 0325 07/08/15 1835 07/08/15 2230 07/09/15 0425  NA 134* 136 134*  --  137  K 3.4* 3.3* 4.2  --  3.9  CL 98* 97* 95*  --  98*  CO2 26 31 26   --  30  GLUCOSE 115* 112* 188*  --  149*  BUN 28* 24* 32*  --  33*    CREATININE 1.86* 1.60* 1.97* 1.93* 1.96*  CALCIUM 8.1* 8.4* 8.6*  --  8.5*   Liver Function Tests:  Recent Labs Lab 07/08/15 1835  AST 20  ALT 16*  ALKPHOS 75  BILITOT 0.8  PROT 7.1  ALBUMIN 3.2*   No results for input(s): LIPASE, AMYLASE in the last 168 hours. No results for input(s): AMMONIA in the last 168 hours. CBC:  Recent Labs Lab 07/03/15 0333 07/08/15 1835 07/08/15 2230 07/09/15 0425  WBC 10.9* 17.6* 20.4* 16.7*  NEUTROABS  --  16.3*  --   --   HGB 11.6* 13.4 12.6* 12.5*  HCT 35.6* 41.6 39.3 39.2  MCV 80.9 82.7 83.1 82.0  PLT 121* 173 160 148*   Cardiac Enzymes:  Recent Labs Lab 07/02/15 1350 07/02/15 1932 07/08/15 1835 07/08/15 2230 07/09/15 0425  TROPONINI 0.03 0.03 0.04* 0.06* 0.05*   BNP: Invalid input(s): POCBNP CBG:  Recent Labs Lab 07/03/15 1109 07/03/15 1621 07/03/15 2132 07/04/15 0556 07/04/15 1120  GLUCAP 101* 147* 105* 143* 116*    Recent Results (from the past 240 hour(s))  Urine culture     Status: None  Collection Time: 07/02/15  4:40 AM  Result Value Ref Range Status   Specimen Description URINE, CATHETERIZED  Final   Special Requests NONE  Final   Culture NO GROWTH 1 DAY  Final   Report Status 07/03/2015 FINAL  Final  Blood culture (routine x 2)     Status: None   Collection Time: 07/02/15  5:25 AM  Result Value Ref Range Status   Specimen Description BLOOD FOREARM RIGHT  Final   Special Requests BOTTLES DRAWN AEROBIC AND ANAEROBIC 5CC  Final   Culture NO GROWTH 5 DAYS  Final   Report Status 07/07/2015 FINAL  Final  Blood culture (routine x 2)     Status: None   Collection Time: 07/02/15  5:30 AM  Result Value Ref Range Status   Specimen Description BLOOD HAND RIGHT  Final   Special Requests BOTTLES DRAWN AEROBIC AND ANAEROBIC 5CC  Final   Culture NO GROWTH 5 DAYS  Final   Report Status 07/07/2015 FINAL  Final     Studies:              All Imaging reviewed and is as per above notation   Scheduled Meds: .  aspirin EC  81 mg Oral q morning - 10a  . carbidopa-levodopa  1 tablet Oral QPM  . diltiazem  360 mg Oral Daily  . enoxaparin (LOVENOX) injection  40 mg Subcutaneous QHS  . furosemide  80 mg Oral Daily  . levothyroxine  112 mcg Oral QAC breakfast  . linagliptin  5 mg Oral Daily  . LORazepam  0.5 mg Oral TID  . mometasone-formoterol  2 puff Inhalation BID  . piperacillin-tazobactam (ZOSYN)  IV  3.375 g Intravenous Q8H  . QUEtiapine  25 mg Oral QHS  . sertraline  100 mg Oral QPM  . simvastatin  40 mg Oral QHS  . sodium chloride  3 mL Intravenous Q12H  . vancomycin  1,000 mg Intravenous Q24H   Continuous Infusions:    Assessment/Plan:  1 . Sepsis 2/2 HCAP -differential diagnosis infected foot ulcer [unlikely] versus pyelonephritis - Vanc, zosyn for now -Would consider him relatively immunocompromised as is on chronic suppressive amoxicillin 500 twice a day -Holding on IVF given hx of volume overload and elevated BNP .  -Cultures pending (blood) 7/20, urine 7/20  -Repeat lactate is trending downwards from 2.56-->1.5  -No further temperature -Might need to involve ID in decision-making regarding his care 2 . Hypoxic resp failure, multifactorial secondary to decompensated heart failure diastolic, probable pneumonia  - 2/2 HCAP.  -On supplemental O2  -Desats screen as needed -I've transitioned his Lasix to IV 40 twice a day from 80 mg daily -I'm not sure that he is compliant on his by mouth home Lasix per last discharge summary just 4 days ago on 7/14 by Dr. [place] Pink -He will need extensive education 3. B/L LE wounds - wound consult placed  -Wound looks clean 4. Severe AS on TTE 07/03/15  - being cautious w/ IVF.  -I/O. Daily weights  -has lost ~ 3 lbsd since last admit -d/w Cardiology who will discuss the options available to him to him 5. Demand ischemia  - trend of troponin is flat -stop chekcing for now 6. History of MAT/PVCs -Continue Cardizem 360 daily and I placed  him on telemetry 7. Bipolar- -Continue lorazepam 0.5 3 times a day, Seroquel 25 daily at bedtime, sertraline 100 every afternoon -8. Diabetes mellitus type II Continue for gentle 5 mg daily. A.m. blood sugar checks  9-hypothyroidism -Consider outpatient TSH -Continue Synthroid 112 g daily  Code Status: Presumed full  Family Communication: No family at bedside Disposition Plan: Inpatient pending resolution and cardiology input We might need to search for another source of infection if he has further fever Chest x-ray ordered for a.m., wound nurse consulted, cardiology consulted Will need telemetry as has history of MAT   Verneita Griffes, MD  [facility] Pager [phone] 07/09/2015, 10:09 AM    LOS: 1 day

## 2015-07-09 NOTE — Consult Note (Signed)
CARDIOLOGY CONSULT NOTE   Patient ID: [name] MRN: [id number], DOB/AGE: 02/11/1929   Admit date: 07/08/2015 Date of Consult: 07/09/2015   Primary Physician: [name], MD Primary Cardiologist: Dr. Marlou Porch  Pt. Profile  frail 81 year old Caucasian male was past medical history of COPD, PAD, chronic LE venous insufficiency, stage III CKD, h/o L RAS s/p L renal artery stent, and MAT controlled on diltiazem who was recently admitted from 7/14-7/16 discharged after diuresis, came back on 7/20 with increasing dyspnea, fever and cough. Also had 2 mechanical falls.  Problem List  Past Medical History  Diagnosis Date  . Hyperlipidemia   . COPD (chronic obstructive pulmonary disease)   . Leg pain   . GERD (gastroesophageal reflux disease)   . Depression   . Dizziness   . Productive cough   . Thyroid disease   . Peripheral vascular disease   . ED (erectile dysfunction)   . Hypertension     08/17/12 - wife denies  . Walking pneumonia 2000's  . H/O hiatal hernia   . Migraine     "when I was a young boy"  . Arthritis     "hands" (08/28/2014)  . Anxiety   . Hypothyroidism   . Chronic kidney disease (CKD), stage III (moderate)     [name] 08/28/2014    Past Surgical History  Procedure Laterality Date  . Renal artery stent Left   . Kidney Bruce surgery  1989  . Lower extremity angiogram Left 03-20-2014    Dr. Tinnie Gens  . Tonsillectomy    . Cataract extraction, bilateral Bilateral   . Blepharoplasty Right   . Abdominal aortagram N/A 03/20/2013    Procedure: ABDOMINAL Maxcine Ham;  Surgeon: [name], MD;  Location: [facility] CATH LAB;  Service: Cardiovascular;  Laterality: N/A;  . Lower extremity angiogram Left 03/20/2013    Procedure: LOWER EXTREMITY ANGIOGRAM;  Surgeon: [name], MD;  Location: [facility] CATH LAB;  Service: Cardiovascular;  Laterality: Left;     Allergies  No Known Allergies  HPI   The patient is a frail 81 year old Caucasian male was past medical  history of COPD, PAD, chronic LE venous insufficiency, stage III CKD, h/o L RAS s/p L renal artery stent, and MAT controlled on diltiazem. Patient does not have significant past cardiac history. He was last seen by cardiology service in October 2015 at which time he was admitted due to sepsis from heel ulcer on the right foot. He was seen by cardiology at the time for MAT and was placed on long-acting diltiazem for rate control. In 2015, his echocardiogram showed EF of 40-34%, grade 1 diastolic dysfunction, moderate aortic stenosis, PA peak pressure 43. According to the patient, his ambulatory status has been fairly limited. He does get around with a walker occasionally, however majority of the time he used an electric mobile chair to get around. He also complained of intermittent chest discomfort that occurs once every 2 weeks for the past 3 years. It is occurring both with exertion and at rest.   He was last admitted by internal medicine service from 7/14 until 07/04/2015 for volume overload and shortness of breath. He had creatinine of 2.11 on arrival which decreased down to 1.6 after diuresis. According to the previous admission note from 7/14, patient did not take his diabetic for several days which likely triggered the event. On discharge, his weight went down to 206 lbs. echocardiogram obtained on 07/03/2015 showed EF 60-65%, akinesis of basal inferior myocardium, moderate to  severe AS which has progressed since the last echo, ascending aortic diameter 41 mm. Since last week, patient continued to have productive cough with yellow sputum. He will return to the [facility] on 07/08/2015 with increasing weakness and falls. According to the patient, though he did fall twice, he described them as mechanical fall after tripping over his socks. He denies any recent dizziness or presyncope that could have precipitated the fall. On arrival to [facility], he was noted to have a temperature 103. He was  also dyspneic. Initial BNP was 148. Troponin 0.04. Lactic acid 2.56. White blood cell count elevated at 17.6. Blood culture has been obtained and is currently pending. Urinalysis shows cloudy urine with few bacteria and negative nitrite. Urine culture pending. EKG showed MAT with heart rate 117, right bundle branch block. Cardiology has been consulted for acute on chronic diastolic heart failure and assessment of potential options for his moderate to severe AS.  Inpatient Medications  . aspirin EC  81 mg Oral q morning - 10a  . carbidopa-levodopa  1 tablet Oral QPM  . diltiazem  360 mg Oral Daily  . enoxaparin (LOVENOX) injection  40 mg Subcutaneous QHS  . feeding supplement (ENSURE ENLIVE)  237 mL Oral BID BM  . feeding supplement (PRO-STAT SUGAR FREE 64)  30 mL Oral BID  . furosemide  40 mg Intravenous Q12H  . levothyroxine  112 mcg Oral QAC breakfast  . linagliptin  5 mg Oral Daily  . LORazepam  0.5 mg Oral TID  . mometasone-formoterol  2 puff Inhalation BID  . piperacillin-tazobactam (ZOSYN)  IV  3.375 g Intravenous Q8H  . QUEtiapine  25 mg Oral QHS  . sertraline  100 mg Oral QPM  . simvastatin  40 mg Oral QHS  . sodium chloride  3 mL Intravenous Q12H  . vancomycin  1,000 mg Intravenous Q24H    Family History Family History  Problem Relation Age of Onset  . Other Mother     AAA  . AAA (abdominal aortic aneurysm) Mother   . Heart attack Father   . Alcohol abuse Father   . Diabetes Father   . Other Father     amputation  . Diabetes Brother   . Coronary artery disease Brother   . Dementia Brother      Social History History   Social History  . Marital Status: Married    Spouse Name: N/A  . Number of Children: N/A  . Years of Education: N/A   Occupational History  . Not on file.   Social History Main Topics  . Smoking status: Former Smoker -- 1.00 packs/day for 25 years    Types: Cigarettes    Quit date: 08/17/2012  . Smokeless tobacco: Never Used  . Alcohol  Use: 0.0 oz/week    0 Standard drinks or equivalent per week     Comment: 08/28/2014 "last drink was in ~ 2012"  . Drug Use: No  . Sexual Activity: Not on file   Other Topics Concern  . Not on file   Social History Narrative     Review of Systems  General:  No chills, fever, night sweats or weight changes.  Cardiovascular:  No dyspnea on exertion, edema, orthopnea, palpitations, paroxysmal nocturnal dyspnea. +occasional chest tightness Dermatological: No rash, lesions/masses Respiratory: +cough, dyspnea improved compare to yesterday Urologic: No hematuria, dysuria Abdominal:   No nausea, vomiting, diarrhea, bright red blood per rectum, melena, or hematemesis Neurologic:  No visual changes, wkns,  changes in mental status. No dizziness or presyncope. All other systems reviewed and are otherwise negative except as noted above.  Physical Exam  Blood pressure 140/52, pulse 71, temperature 97.9 F (36.6 C), temperature source Oral, resp. rate 20, height 6' (1.829 m), weight 205 lb 9.6 oz (93.26 kg), SpO2 90 %.  General: Pleasant, NAD. Frail looking.  Psych: Normal affect. Neuro: Alert and oriented X 3. Moves all extremities spontaneously. HEENT: Normal  Neck: Supple without bruits or JVD. Lungs:  Resp regular and unlabored. Bilateral rhonchi mixed with rale.  Heart: RRR no s3, s4, or murmurs. Abdomen: Soft, non-tender, non-distended, BS + x 4.  Extremities: No clubbing, cyanosis. Radial pulse 2+ bilaterally. Trace edema. LE appears pink and moist.   Labs   Recent Labs  07/08/15 1835 07/08/15 2230 07/09/15 0425  TROPONINI 0.04* 0.06* 0.05*   Lab Results  Component Value Date   WBC 16.7* 07/09/2015   HGB 12.5* 07/09/2015   HCT 39.2 07/09/2015   MCV 82.0 07/09/2015   PLT 148* 07/09/2015    Recent Labs Lab 07/08/15 1835  07/09/15 0425  NA 134*  --  137  K 4.2  --  3.9  CL 95*  --  98*  CO2 26  --  30  BUN 32*  --  33*  CREATININE 1.97*  < > 1.96*  CALCIUM 8.6*   --  8.5*  PROT 7.1  --   --   BILITOT 0.8  --   --   ALKPHOS 75  --   --   ALT 16*  --   --   AST 20  --   --   GLUCOSE 188*  --  149*  < > = values in this interval not displayed.  Radiology/Studies  Dg Chest 2 View  07/03/2015   CLINICAL DATA:  Evaluate pulmonary edema.  EXAM: CHEST  2 VIEW  COMPARISON:  Chest radiograph 07/02/2015.  FINDINGS: Monitoring leads overlie the patient. Stable cardiomegaly. Mild interval improvement pulmonary vascular redistribution and mid and lower lung interstitial opacities. Small bilateral pleural effusions. Mid thoracic spine degenerative changes.  IMPRESSION: Cardiomegaly with slight interval improvement pulmonary edema.   Electronically Signed   By: Lovey Newcomer M.D.   On: 07/03/2015 13:02   Dg Chest 2 View  07/02/2015   CLINICAL DATA:  Shortness of breath  EXAM: CHEST  2 VIEW  COMPARISON:  10/09/2014  FINDINGS: Cardiac enlargement. Pulmonary vascular congestion. Interstitial edema in the lung bases. No blunting of costophrenic angles. No pneumothorax. Calcified and tortuous aorta with ectasia. Degenerative changes in the spine.  IMPRESSION: Cardiac enlargement with mild pulmonary vascular congestion and basilar edema.   Electronically Signed   By: Lucienne Capers M.D.   On: 07/02/2015 03:22   Dg Lumbar Spine Complete  07/02/2015   CLINICAL DATA:  Shortness of breath tonight. [employer]. Low back pain. Pain both hips.  EXAM: LUMBAR SPINE - COMPLETE 4+ VIEW  COMPARISON:  None.  FINDINGS: Normal alignment of the lumbar spine. Degenerative changes throughout with bridging anterior osteophytes. No vertebral compression deformities. No focal bone lesion or bone destruction. Bone cortex and trabecular architecture appear intact. Vascular calcifications.  IMPRESSION: Degenerative changes in the lumbar spine. Normal alignment. No acute fractures identified.   Electronically Signed   By: Lucienne Capers M.D.   On: 07/02/2015 03:24   Dg Pelvis 1-2 Views  07/02/2015    CLINICAL DATA:  Shortness of breath tonight. Fall tonight. Low back pain. Pain both hips.  EXAM: PELVIS - 1-2 VIEW  COMPARISON:  None.  FINDINGS: There is no evidence of pelvic fracture or diastasis. No pelvic bone lesions are seen. Degenerative changes in both hips. Surgical clips in the pelvis. Vascular calcifications.  IMPRESSION: No acute bony abnormalities.   Electronically Signed   By: Lucienne Capers M.D.   On: 07/02/2015 03:22   Ct Head Wo Contrast  07/02/2015   CLINICAL DATA:  Initial evaluation for acute trauma, fall.  EXAM: CT HEAD WITHOUT CONTRAST  CT CERVICAL SPINE WITHOUT CONTRAST  TECHNIQUE: Multidetector CT imaging of the head and cervical spine was performed following the standard protocol without intravenous contrast. Multiplanar CT image reconstructions of the cervical spine were also generated.  COMPARISON:  None.  FINDINGS: CT HEAD FINDINGS  Diffuse prominence of the CSF containing spaces is compatible with generalized cerebral atrophy. Patchy and confluent hypodensity within the periventricular and deep white matter both cerebral hemispheres present, most consistent with chronic small vessel ischemic disease. Small remote lacunar infarct present within the bilateral basal ganglia. Prominent vascular calcifications present within the carotid siphons and distal vertebral arteries.  No acute large vessel territory infarct. No intracranial hemorrhage. No mass lesion, midline shift, or mass effect. No extra-axial fluid collection. No hydrocephalus.  Scalp soft tissues within normal limits. No acute abnormality about the orbits.  Calvarium intact.  Chronic mucosal thickening present within the right sphenoid sinus. Mild mucosal thickening within the max O sinuses bilaterally, likely chronic. Paranasal sinuses are otherwise clear. No mastoid effusion.  CT CERVICAL SPINE FINDINGS  Trace anterolisthesis of C6 on C7, likely chronic. Otherwise, vertebral bodies are normally aligned with preservation  of the normal cervical lordosis. Vertebral body heights are preserved. Normal C1-2 articulations are intact. No prevertebral soft tissue swelling. No acute fracture or listhesis.  Advanced multilevel degenerative disc disease and facet arthropathy present throughout the cervical spine, most prevalent at C5-6 and C6-7.  No acute soft tissue abnormality. Prominent vascular calcifications present about the carotid bifurcations.  Visualized lung apices are clear without evidence of apical pneumothorax. Large amount a debris present within the distal esophagus.  IMPRESSION: CT BRAIN:  1. No acute intracranial process. 2. Generalized cerebral atrophy with chronic microvascular ischemic disease and small remote lacunar infarcts within the bilateral basal ganglia.  CT CERVICAL SPINE:  1. No acute traumatic injury within the cervical spine. 2. Large amount of debris within the partially visualized upper esophagus. This patient is likely at risk for aspiration.   Electronically Signed   By: Jeannine Boga M.D.   On: 07/02/2015 05:00   Ct Cervical Spine Wo Contrast  07/02/2015   CLINICAL DATA:  Initial evaluation for acute trauma, fall.  EXAM: CT HEAD WITHOUT CONTRAST  CT CERVICAL SPINE WITHOUT CONTRAST  TECHNIQUE: Multidetector CT imaging of the head and cervical spine was performed following the standard protocol without intravenous contrast. Multiplanar CT image reconstructions of the cervical spine were also generated.  COMPARISON:  None.  FINDINGS: CT HEAD FINDINGS  Diffuse prominence of the CSF containing spaces is compatible with generalized cerebral atrophy. Patchy and confluent hypodensity within the periventricular and deep white matter both cerebral hemispheres present, most consistent with chronic small vessel ischemic disease. Small remote lacunar infarct present within the bilateral basal ganglia. Prominent vascular calcifications present within the carotid siphons and distal vertebral arteries.  No  acute large vessel territory infarct. No intracranial hemorrhage. No mass lesion, midline shift, or mass effect. No extra-axial fluid collection. No hydrocephalus.  Scalp soft tissues within  normal limits. No acute abnormality about the orbits.  Calvarium intact.  Chronic mucosal thickening present within the right sphenoid sinus. Mild mucosal thickening within the max O sinuses bilaterally, likely chronic. Paranasal sinuses are otherwise clear. No mastoid effusion.  CT CERVICAL SPINE FINDINGS  Trace anterolisthesis of C6 on C7, likely chronic. Otherwise, vertebral bodies are normally aligned with preservation of the normal cervical lordosis. Vertebral body heights are preserved. Normal C1-2 articulations are intact. No prevertebral soft tissue swelling. No acute fracture or listhesis.  Advanced multilevel degenerative disc disease and facet arthropathy present throughout the cervical spine, most prevalent at C5-6 and C6-7.  No acute soft tissue abnormality. Prominent vascular calcifications present about the carotid bifurcations.  Visualized lung apices are clear without evidence of apical pneumothorax. Large amount a debris present within the distal esophagus.  IMPRESSION: CT BRAIN:  1. No acute intracranial process. 2. Generalized cerebral atrophy with chronic microvascular ischemic disease and small remote lacunar infarcts within the bilateral basal ganglia.  CT CERVICAL SPINE:  1. No acute traumatic injury within the cervical spine. 2. Large amount of debris within the partially visualized upper esophagus. This patient is likely at risk for aspiration.   Electronically Signed   By: Jeannine Boga M.D.   On: 07/02/2015 05:00   Dg Chest Port 1 View  07/08/2015   CLINICAL DATA:  Increased shortness of breath and weakness today. COPD.  EXAM: PORTABLE CHEST - 1 VIEW  COMPARISON:  07/03/2015  FINDINGS: Cardiomegaly stable. Diffuse interstitial prominence and bibasilar airspace disease shows no significant  change, suspicious for diffuse pulmonary edema. Small bilateral pleural effusions cannot be excluded. No evidence of pneumothorax.  IMPRESSION: Stable cardiomegaly and symmetric interstitial and bibasilar airspace disease, suspicious for diffuse pulmonary edema/congestive heart failure. Probable small bilateral pleural effusions.   Electronically Signed   By: Earle Gell M.D.   On: 07/08/2015 19:08    ECG  MAT with RBBB  ASSESSMENT AND PLAN  1. Acute dyspnea related combination of possible HCAP and HF  2. Acute on chronic diastolic HF  - when asked about why he stopped his diuretic previously, he said "it hurt his kidneys"  - does appear to have some degree of fluid overload based on CXR result, although likely has other pulmonary issue as well. Mixed rhonchi and rale on exam. JVD is not obvious. Trace LE edema   - agree with IV diuresis for now, monitor renal function.   3. Moderate to severe AS  - Echo 07/03/2015 EF 60-65%, akinesis of basal inferior myocardium, moderate to severe AS which has progressed since the last echo, ascending aortic diameter 41 mm  - given his multiple comorbidities, he would no be a good candidate for open surgery. Will discuss with MD regarding whether he would be a candidate for TAVR, of course, prior to any evaluation he will need to be tuned up  - per pt, he has intermittent chest discomfort for 3 years, not pain, but occasional tightness not related to exertion. Depend on whether he is candidate for TAVR, workup can be outpatient myoview vs cath. Akinesis of basal inferior myocardium noted on previous echo.    4. Sepsis with HCAP  5. MAT: continue diltiazem  6. PAD with chronic R food ulcer  7. LE venous insufficiency  8. Stage III CKD  9. L RAS s/p stent  Hilbert Corrigan, PA-C 07/09/2015, 11:37 AM Patient seen and examined and history reviewed. Agree with above findings and plan. 81 yo WM recently  admitted for volume overload and successfully  diuresed. Now presents with productive cough, dyspnea, fever to 103. Lungs reveal very coarse rhonchi. JVD is not elevated. Weight is down from prior admit. I am not impressed that he has volume overload now. I think this is predominantly HCAP. I would be inclined to transition to oral lasix soon.He has a history of MAT- will continue diltiazem for management. I would not be surprised if HR increases with current stressors. Troponin minimally elevated with flat trend consistent with acute illness. While he has moderate to severe AS he is clearly not a candidate for AVR with surgery. Whether he would be a candidate for TAVR will depend on recovery from his other acute problems and a period of stability. This can be addressed later with outpatient cardiology follow up.  Dera Vanaken [place], [place] 07/09/2015 12:35 PM

## 2015-07-10 ENCOUNTER — Inpatient Hospital Stay (HOSPITAL_COMMUNITY): Payer: Medicare Other

## 2015-07-10 DIAGNOSIS — N183 Chronic kidney disease, stage 3 (moderate): Secondary | ICD-10-CM

## 2015-07-10 LAB — CBC WITH DIFFERENTIAL/PLATELET
BASOS ABS: 0 10*3/uL (ref 0.0–0.1)
BASOS PCT: 0 % (ref 0–1)
EOS ABS: 0.2 10*3/uL (ref 0.0–0.7)
Eosinophils Relative: 1 % (ref 0–5)
HCT: 37 % — ABNORMAL LOW (ref 39.0–52.0)
Hemoglobin: 12.1 g/dL — ABNORMAL LOW (ref 13.0–17.0)
Lymphocytes Relative: 7 % — ABNORMAL LOW (ref 12–46)
Lymphs Abs: 1 10*3/uL (ref 0.7–4.0)
MCH: 26.5 pg (ref 26.0–34.0)
MCHC: 32.7 g/dL (ref 30.0–36.0)
MCV: 81.1 fL (ref 78.0–100.0)
Monocytes Absolute: 0.9 10*3/uL (ref 0.1–1.0)
Monocytes Relative: 6 % (ref 3–12)
NEUTROS PCT: 86 % — AB (ref 43–77)
Neutro Abs: 13.2 10*3/uL — ABNORMAL HIGH (ref 1.7–7.7)
Platelets: 185 10*3/uL (ref 150–400)
RBC: 4.56 MIL/uL (ref 4.22–5.81)
RDW: 15.4 % (ref 11.5–15.5)
WBC: 15.3 10*3/uL — ABNORMAL HIGH (ref 4.0–10.5)

## 2015-07-10 LAB — COMPREHENSIVE METABOLIC PANEL
ALT: 12 U/L — AB (ref 17–63)
AST: 16 U/L (ref 15–41)
Albumin: 3 g/dL — ABNORMAL LOW (ref 3.5–5.0)
Alkaline Phosphatase: 71 U/L (ref 38–126)
Anion gap: 12 (ref 5–15)
BUN: 31 mg/dL — AB (ref 6–20)
CO2: 30 mmol/L (ref 22–32)
CREATININE: 1.74 mg/dL — AB (ref 0.61–1.24)
Calcium: 8.1 mg/dL — ABNORMAL LOW (ref 8.9–10.3)
Chloride: 93 mmol/L — ABNORMAL LOW (ref 101–111)
GFR calc non Af Amer: 34 mL/min — ABNORMAL LOW (ref >60)
GFR, EST AFRICAN AMERICAN: 40 mL/min — AB (ref >60)
Glucose, Bld: 147 mg/dL — ABNORMAL HIGH (ref 65–99)
POTASSIUM: 2.8 mmol/L — AB (ref 3.5–5.1)
Sodium: 135 mmol/L (ref 135–145)
Total Bilirubin: 1.1 mg/dL (ref 0.3–1.2)
Total Protein: 6.8 g/dL (ref 6.5–8.1)

## 2015-07-10 LAB — TROPONIN I
TROPONIN I: 0.03 ng/mL (ref <0.031)
Troponin I: 0.04 ng/mL — ABNORMAL HIGH (ref <0.031)
Troponin I: 0.03 ng/mL (ref <0.031)
Troponin I: 0.03 ng/mL (ref <0.031)

## 2015-07-10 LAB — URINE CULTURE

## 2015-07-10 MED ORDER — POTASSIUM CHLORIDE 20 MEQ/15ML (10%) PO SOLN
40.0000 meq | Freq: Two times a day (BID) | ORAL | Status: DC
  Administered 2015-07-10: 40 meq via ORAL
  Filled 2015-07-10: qty 30

## 2015-07-10 MED ORDER — ATORVASTATIN CALCIUM 10 MG PO TABS
20.0000 mg | ORAL_TABLET | Freq: Every day | ORAL | Status: DC
  Administered 2015-07-10 – 2015-07-12 (×3): 20 mg via ORAL
  Filled 2015-07-10 (×3): qty 2

## 2015-07-10 MED ORDER — FUROSEMIDE 40 MG PO TABS
40.0000 mg | ORAL_TABLET | Freq: Two times a day (BID) | ORAL | Status: DC
  Administered 2015-07-10 – 2015-07-13 (×6): 40 mg via ORAL
  Filled 2015-07-10 (×6): qty 1

## 2015-07-10 MED ORDER — POTASSIUM CHLORIDE 20 MEQ/15ML (10%) PO SOLN
40.0000 meq | Freq: Three times a day (TID) | ORAL | Status: DC
  Administered 2015-07-10 – 2015-07-13 (×10): 40 meq via ORAL
  Filled 2015-07-10 (×10): qty 30

## 2015-07-10 NOTE — Progress Notes (Signed)
The order for simvastatin(Zocor) was changed to an equivalent dose of atorvastatin(Lipitor) due to the potential drug interaction with diltiazem.  When taken in combination with medications that inhibit its metabolism, simvastatin can accumulate which increases the risk of liver toxicity, myopathy, or rhabdomyolysis.  Simvastatin dose should not exceed 10mg /day in patients taking verapamil, diltiazem, fibrates, or niacin >or= 1g/day.   Simvastatin dose should not exceed 20mg /day in patients taking amlodipine, ranolazine or amiodarone.   Please consider this potential interaction at discharge.  Reuel Boom, PharmD, BCPS Pager: [phone] 07/10/2015, 7:33 AM

## 2015-07-10 NOTE — Progress Notes (Signed)
[name] YQM:[id number] DOB: 06-17-1929 DOA: 07/08/2015 PCP: [name], MD  Brief narrative: 81 y/o ? prior heavy smoker c COPDM, POVD + Occ SFA-Calcaneal Osteo c Group B strep Bacteremia/Septic shock-completed IV Abx Nov26/2016 and now on Suppressive Amoxicillin 500 bid [foll by Dr. Tommy Medal ID, Dr. [facility]], CKD III, prior MAT c PVC, Sacral decub h/o, Morbid obesity, Body mass index is 27.74 kg/(m^2)., Mo AoS, RLS, Bipolar on meds admitted 07/08/15 with SOB CXR revealed potential PNA  Past medical history-As per Problem list Chart reviewed as below-   Consultants:  None yet  Procedures:    Antibiotics:  Vanc   Zosyn   Subjective   Fair no n/v cp Still has some sputum No blurred nor double vision "I dont liek the lasix"   "its hard for me to get up and pee"   Objective    Interim History:   Telemetry:    Objective: Filed Vitals:   07/09/15 2103 07/09/15 2143 07/10/15 0524 07/10/15 1104  BP:  135/51 144/74   Pulse:  86 72   Temp:  98.4 F (36.9 C) 98.4 F (36.9 C)   TempSrc:  Oral Oral   Resp:  20 20   Height:      Weight:   92.8 kg (204 lb 9.4 oz)   SpO2: 93% 94% 95% 94%    Intake/Output Summary (Last 24 hours) at 07/10/15 1219 Last data filed at 07/10/15 1102  Gross per 24 hour  Intake    760 ml  Output   1725 ml  Net   -965 ml    Exam:  General: eomi ncat Cardiovascular: s1 s 2no m/r/g Respiratory: crackles bilaterally Abdomen: soft, non tedner non distended Skin le swelling bilaterally.  Wound on the Heel looks clean with no erythema and no slough Neuro intact  Data Reviewed: Basic Metabolic Panel:  Recent Labs Lab 07/04/15 0325 07/08/15 1835 07/08/15 2230 07/09/15 0425 07/10/15 0423  NA 136 134*  --  137 135  K 3.3* 4.2  --  3.9 2.8*  CL 97* 95*  --  98* 93*  CO2 31 26  --  30 30  GLUCOSE 112* 188*  --  149* 147*  BUN 24* 32*  --  33* 31*  CREATININE 1.60* 1.97* 1.93* 1.96* 1.74*  CALCIUM 8.4* 8.6*  --   8.5* 8.1*   Liver Function Tests:  Recent Labs Lab 07/08/15 1835 07/10/15 0423  AST 20 16  ALT 16* 12*  ALKPHOS 75 71  BILITOT 0.8 1.1  PROT 7.1 6.8  ALBUMIN 3.2* 3.0*   No results for input(s): LIPASE, AMYLASE in the last 168 hours. No results for input(s): AMMONIA in the last 168 hours. CBC:  Recent Labs Lab 07/08/15 1835 07/08/15 2230 07/09/15 0425 07/10/15 0423  WBC 17.6* 20.4* 16.7* 15.3*  NEUTROABS 16.3*  --   --  13.2*  HGB 13.4 12.6* 12.5* 12.1*  HCT 41.6 39.3 39.2 37.0*  MCV 82.7 83.1 82.0 81.1  PLT 173 160 148* 185   Cardiac Enzymes:  Recent Labs Lab 07/08/15 1835 07/08/15 2230 07/09/15 0425 07/10/15 0423 07/10/15 0945  TROPONINI 0.04* 0.06* 0.05* 0.04* 0.03   BNP: Invalid input(s): POCBNP CBG:  Recent Labs Lab 07/03/15 1621 07/03/15 2132 07/04/15 0556 07/04/15 1120  GLUCAP 147* 105* 143* 116*    Recent Results (from the past 240 hour(s))  Urine culture     Status: None   Collection Time: 07/02/15  4:40 AM  Result Value Ref  Range Status   Specimen Description URINE, CATHETERIZED  Final   Special Requests NONE  Final   Culture NO GROWTH 1 DAY  Final   Report Status 07/03/2015 FINAL  Final  Blood culture (routine x 2)     Status: None   Collection Time: 07/02/15  5:25 AM  Result Value Ref Range Status   Specimen Description BLOOD FOREARM RIGHT  Final   Special Requests BOTTLES DRAWN AEROBIC AND ANAEROBIC 5CC  Final   Culture NO GROWTH 5 DAYS  Final   Report Status 07/07/2015 FINAL  Final  Blood culture (routine x 2)     Status: None   Collection Time: 07/02/15  5:30 AM  Result Value Ref Range Status   Specimen Description BLOOD HAND RIGHT  Final   Special Requests BOTTLES DRAWN AEROBIC AND ANAEROBIC 5CC  Final   Culture NO GROWTH 5 DAYS  Final   Report Status 07/07/2015 FINAL  Final  Blood Culture (routine x 2)     Status: None (Preliminary result)   Collection Time: 07/08/15  6:21 PM  Result Value Ref Range Status   Specimen  Description BLOOD BLOOD RIGHT FOREARM  Final   Special Requests BOTTLES DRAWN AEROBIC AND ANAEROBIC 5CC  Final   Culture   Final    NO GROWTH < 24 HOURS Performed at [facility]    Report Status PENDING  Incomplete  Urine culture     Status: None (Preliminary result)   Collection Time: 07/08/15  6:21 PM  Result Value Ref Range Status   Specimen Description URINE, CLEAN CATCH  Final   Special Requests NONE  Final   Culture   Final    CULTURE REINCUBATED FOR BETTER GROWTH Performed at [facility]    Report Status PENDING  Incomplete  Blood Culture (routine x 2)     Status: None (Preliminary result)   Collection Time: 07/08/15  6:39 PM  Result Value Ref Range Status   Specimen Description BLOOD BLOOD LEFT HAND  Final   Special Requests BOTTLES DRAWN AEROBIC AND ANAEROBIC 3CC  Final   Culture   Final    NO GROWTH < 24 HOURS Performed at [facility]    Report Status PENDING  Incomplete     Studies:              All Imaging reviewed and is as per above notation   Scheduled Meds: . aspirin EC  81 mg Oral q morning - 10a  . atorvastatin  20 mg Oral QHS  . carbidopa-levodopa  1 tablet Oral QPM  . diltiazem  360 mg Oral Daily  . enoxaparin (LOVENOX) injection  40 mg Subcutaneous QHS  . feeding supplement (ENSURE ENLIVE)  237 mL Oral BID BM  . feeding supplement (PRO-STAT SUGAR FREE 64)  30 mL Oral BID  . furosemide  40 mg Intravenous Q12H  . furosemide  40 mg Oral BID  . levothyroxine  112 mcg Oral QAC breakfast  . linagliptin  5 mg Oral Daily  . LORazepam  0.5 mg Oral TID  . mometasone-formoterol  2 puff Inhalation BID  . piperacillin-tazobactam (ZOSYN)  IV  3.375 g Intravenous Q8H  . potassium chloride  40 mEq Oral TID  . QUEtiapine  25 mg Oral QHS  . sertraline  100 mg Oral QPM  . sodium chloride  3 mL Intravenous Q12H  . vancomycin  1,000 mg Intravenous Q24H   Continuous Infusions:    Assessment/Plan:  1 . Sepsis  2/2 HCAP -differential  diagnosis infected foot ulcer [unlikely] versus pyelonephritis - Vanc, zosyn for now -Would consider him relatively immunocompromised as is on chronic suppressive amoxicillin 500 twice a day -Holding on IVF given hx of volume overload and elevated BNP .  -Cultures pending (blood) 7/20, urine 7/20 NG so far and Urine multiple organisms -Repeat lactate is trending downwards from 2.56-->1.5  -No further temperature -d/w Dr. Johnnye Sima ID briefly who recs complete HCAP course and revert to Amoxil later  2 . Hypoxic resp failure, multifactorial secondary to decompensated heart failure diastolic with component Aos Severe, probable pneumonia  - 2/2 HCAP.  -On supplemental O2  -Desats screen as needed -I'm not sure that he is compliant on his by mouth home Lasix per last discharge summary just 4 days ago on 7/14 by Dr. [place] Pink -He will need extensive education -Lasix now 40 po BId 7/22 -he seems unwilling to use this dose  3. B/L LE wounds - wound consult placed  -Wound looks clean  4. Severe AS on TTE 07/03/15  - being cautious w/ IVF.  -I/O onl;y -300 cc? -has lost ~ 3 lbsd since last admit  5. Demand ischemia  - trend of troponin is flat -stop chekcing for now  6. History of MAT/PVCs -Continue Cardizem 360 daily and I placed him on telemetry  7. Bipolar- -Continue lorazepam 0.5 3 times a day, Seroquel 25 daily at bedtime, sertraline 100 every afternoon  8. Diabetes mellitus type II Continue for gentle 5 mg daily. A.m. blood sugar checks  9-hypothyroidism -Consider outpatient TSH -Continue Synthroid 112 g daily  Moderate Malnutrition -per Nutritionist  Code Status: Presumed full  Family Communication: No family at bedside Disposition Plan: Inpatient pending resolution-improving  Verneita Griffes, MD  [facility] Pager [phone] 07/10/2015, 12:19 PM    LOS: 2 days

## 2015-07-10 NOTE — Clinical Documentation Improvement (Signed)
Possible Clinical Conditions?  Severe Malnutrition   Protein Calorie Malnutrition Severe Protein Calorie Malnutrition Other Condition Cannot clinically determine  Supporting Information:  Initial Nutrition Assessment by Rosezetta Schlatter, RD at 07/09/2015 11:07 AM  DOCUMENTATION CODES:  Non-severe (moderate) malnutrition in context of chronic illness   INTERVENTION:  - Will order Ensure Enlive BID, each supplement provides 350 kcal and 20 grams of protein  - Will order Prostat BID, each supplement provides 100 kcal and 15 grams of protein  - RD will continue to monitor for needs   NUTRITION DIAGNOSIS:  Predicted suboptimal nutrient intake related to chronic illness as evidenced by per patient/family report, meal completion < 50%.    Thank You, Alessandra Grout, RN, BSN, CCDS,Clinical Documentation Specialist:  [phone]  [phone]=[facility] [place]- Health Information Management

## 2015-07-10 NOTE — Progress Notes (Signed)
Patient Name: [name] Date of Encounter: 07/10/2015  Principal Problem:   Sepsis due to pneumonia Active Problems:   Chronic kidney disease, stage III (moderate)   PAD (peripheral artery disease)   Multifocal atrial tachycardia   Aortic stenosis   Chronic diastolic CHF (congestive heart failure)  SUBJECTIVE  Feels weak and tired. Wants to go home. Denies chest pain or sob. Complaining of mild diffuse abdominal discomfort.   CURRENT MEDS . aspirin EC  81 mg Oral q morning - 10a  . atorvastatin  20 mg Oral QHS  . carbidopa-levodopa  1 tablet Oral QPM  . diltiazem  360 mg Oral Daily  . enoxaparin (LOVENOX) injection  40 mg Subcutaneous QHS  . feeding supplement (ENSURE ENLIVE)  237 mL Oral BID BM  . feeding supplement (PRO-STAT SUGAR FREE 64)  30 mL Oral BID  . furosemide  40 mg Intravenous Q12H  . levothyroxine  112 mcg Oral QAC breakfast  . linagliptin  5 mg Oral Daily  . LORazepam  0.5 mg Oral TID  . mometasone-formoterol  2 puff Inhalation BID  . piperacillin-tazobactam (ZOSYN)  IV  3.375 g Intravenous Q8H  . potassium chloride  40 mEq Oral BID  . QUEtiapine  25 mg Oral QHS  . sertraline  100 mg Oral QPM  . sodium chloride  3 mL Intravenous Q12H  . vancomycin  1,000 mg Intravenous Q24H    OBJECTIVE  Filed Vitals:   07/09/15 1512 07/09/15 2103 07/09/15 2143 07/10/15 0524  BP: 105/54  135/51 144/74  Pulse: 99  86 72  Temp: 98 F (36.7 C)  98.4 F (36.9 C) 98.4 F (36.9 C)  TempSrc: Oral  Oral Oral  Resp: 20  20 20   Height:      Weight:    204 lb 9.4 oz (92.8 kg)  SpO2: 95% 93% 94% 95%    Intake/Output Summary (Last 24 hours) at 07/10/15 0740 Last data filed at 07/10/15 0636  Gross per 24 hour  Intake    760 ml  Output   1875 ml  Net  -1115 ml   Filed Weights   07/08/15 2156 07/09/15 0624 07/10/15 0524  Weight: 206 lb 3.2 oz (93.532 kg) 205 lb 9.6 oz (93.26 kg) 204 lb 9.4 oz (92.8 kg)    PHYSICAL EXAM  General: frail ill appearing man.    Neuro: Alert and oriented X 3. Moves all extremities spontaneously. Psych: Normal affect. HEENT:  Normal  Neck: Supple without bruit. Bibasilar faint rales with rhonchi throughout.  Lungs:  Resp regular and unlabored, CTA. Heart: irregular tachycardia no s3, s4. SEM murmurs. Abdomen: Soft, diffuse tender to palpation, non-distended, BS + x 4.  Extremities: No clubbing, cyanosis or edema. DP/PT/Radials 2+ and equal bilaterally. ACE wrap bilaterally.   Accessory Clinical Findings  CBC  Recent Labs  07/08/15 1835  07/09/15 0425 07/10/15 0423  WBC 17.6*  < > 16.7* 15.3*  NEUTROABS 16.3*  --   --  13.2*  HGB 13.4  < > 12.5* 12.1*  HCT 41.6  < > 39.2 37.0*  MCV 82.7  < > 82.0 81.1  PLT 173  < > 148* 185  < > = values in this interval not displayed. Basic Metabolic Panel  Recent Labs  07/09/15 0425 07/10/15 0423  NA 137 135  K 3.9 2.8*  CL 98* 93*  CO2 30 30  GLUCOSE 149* 147*  BUN 33* 31*  CREATININE 1.96* 1.74*  CALCIUM 8.5* 8.1*   Liver  Function Tests  Recent Labs  07/08/15 1835 07/10/15 0423  AST 20 16  ALT 16* 12*  ALKPHOS 75 71  BILITOT 0.8 1.1  PROT 7.1 6.8  ALBUMIN 3.2* 3.0*   No results for input(s): LIPASE, AMYLASE in the last 72 hours. Cardiac Enzymes  Recent Labs  07/08/15 2230 07/09/15 0425 07/10/15 0423  TROPONINI 0.06* 0.05* 0.04*    TELE  Irregular at rate of 90s-110s. Frequent PVCs.   Radiology/Studies  Dg Chest 2 View  07/03/2015   CLINICAL DATA:  Evaluate pulmonary edema.  EXAM: CHEST  2 VIEW  COMPARISON:  Chest radiograph 07/02/2015.  FINDINGS: Monitoring leads overlie the patient. Stable cardiomegaly. Mild interval improvement pulmonary vascular redistribution and mid and lower lung interstitial opacities. Small bilateral pleural effusions. Mid thoracic spine degenerative changes.  IMPRESSION: Cardiomegaly with slight interval improvement pulmonary edema.   Electronically Signed   By: Lovey Newcomer M.D.   On: 07/03/2015 13:02   Dg  Chest 2 View  07/02/2015   CLINICAL DATA:  Shortness of breath  EXAM: CHEST  2 VIEW  COMPARISON:  10/09/2014  FINDINGS: Cardiac enlargement. Pulmonary vascular congestion. Interstitial edema in the lung bases. No blunting of costophrenic angles. No pneumothorax. Calcified and tortuous aorta with ectasia. Degenerative changes in the spine.  IMPRESSION: Cardiac enlargement with mild pulmonary vascular congestion and basilar edema.   Electronically Signed   By: Lucienne Capers M.D.   On: 07/02/2015 03:22   Dg Lumbar Spine Complete  07/02/2015   CLINICAL DATA:  Shortness of breath tonight. [employer]. Low back pain. Pain both hips.  EXAM: LUMBAR SPINE - COMPLETE 4+ VIEW  COMPARISON:  None.  FINDINGS: Normal alignment of the lumbar spine. Degenerative changes throughout with bridging anterior osteophytes. No vertebral compression deformities. No focal bone lesion or bone destruction. Bone cortex and trabecular architecture appear intact. Vascular calcifications.  IMPRESSION: Degenerative changes in the lumbar spine. Normal alignment. No acute fractures identified.   Electronically Signed   By: Lucienne Capers M.D.   On: 07/02/2015 03:24   Dg Pelvis 1-2 Views  07/02/2015   CLINICAL DATA:  Shortness of breath tonight. Fall tonight. Low back pain. Pain both hips.  EXAM: PELVIS - 1-2 VIEW  COMPARISON:  None.  FINDINGS: There is no evidence of pelvic fracture or diastasis. No pelvic bone lesions are seen. Degenerative changes in both hips. Surgical clips in the pelvis. Vascular calcifications.  IMPRESSION: No acute bony abnormalities.   Electronically Signed   By: Lucienne Capers M.D.   On: 07/02/2015 03:22   Ct Head Wo Contrast  07/02/2015   CLINICAL DATA:  Initial evaluation for acute trauma, fall.  EXAM: CT HEAD WITHOUT CONTRAST  CT CERVICAL SPINE WITHOUT CONTRAST  TECHNIQUE: Multidetector CT imaging of the head and cervical spine was performed following the standard protocol without intravenous contrast.  Multiplanar CT image reconstructions of the cervical spine were also generated.  COMPARISON:  None.  FINDINGS: CT HEAD FINDINGS  Diffuse prominence of the CSF containing spaces is compatible with generalized cerebral atrophy. Patchy and confluent hypodensity within the periventricular and deep white matter both cerebral hemispheres present, most consistent with chronic small vessel ischemic disease. Small remote lacunar infarct present within the bilateral basal ganglia. Prominent vascular calcifications present within the carotid siphons and distal vertebral arteries.  No acute large vessel territory infarct. No intracranial hemorrhage. No mass lesion, midline shift, or mass effect. No extra-axial fluid collection. No hydrocephalus.  Scalp soft tissues within normal limits. No acute  abnormality about the orbits.  Calvarium intact.  Chronic mucosal thickening present within the right sphenoid sinus. Mild mucosal thickening within the max O sinuses bilaterally, likely chronic. Paranasal sinuses are otherwise clear. No mastoid effusion.  CT CERVICAL SPINE FINDINGS  Trace anterolisthesis of C6 on C7, likely chronic. Otherwise, vertebral bodies are normally aligned with preservation of the normal cervical lordosis. Vertebral body heights are preserved. Normal C1-2 articulations are intact. No prevertebral soft tissue swelling. No acute fracture or listhesis.  Advanced multilevel degenerative disc disease and facet arthropathy present throughout the cervical spine, most prevalent at C5-6 and C6-7.  No acute soft tissue abnormality. Prominent vascular calcifications present about the carotid bifurcations.  Visualized lung apices are clear without evidence of apical pneumothorax. Large amount a debris present within the distal esophagus.  IMPRESSION: CT BRAIN:  1. No acute intracranial process. 2. Generalized cerebral atrophy with chronic microvascular ischemic disease and small remote lacunar infarcts within the bilateral  basal ganglia.  CT CERVICAL SPINE:  1. No acute traumatic injury within the cervical spine. 2. Large amount of debris within the partially visualized upper esophagus. This patient is likely at risk for aspiration.   Electronically Signed   By: Jeannine Boga M.D.   On: 07/02/2015 05:00   Ct Cervical Spine Wo Contrast  07/02/2015   CLINICAL DATA:  Initial evaluation for acute trauma, fall.  EXAM: CT HEAD WITHOUT CONTRAST  CT CERVICAL SPINE WITHOUT CONTRAST  TECHNIQUE: Multidetector CT imaging of the head and cervical spine was performed following the standard protocol without intravenous contrast. Multiplanar CT image reconstructions of the cervical spine were also generated.  COMPARISON:  None.  FINDINGS: CT HEAD FINDINGS  Diffuse prominence of the CSF containing spaces is compatible with generalized cerebral atrophy. Patchy and confluent hypodensity within the periventricular and deep white matter both cerebral hemispheres present, most consistent with chronic small vessel ischemic disease. Small remote lacunar infarct present within the bilateral basal ganglia. Prominent vascular calcifications present within the carotid siphons and distal vertebral arteries.  No acute large vessel territory infarct. No intracranial hemorrhage. No mass lesion, midline shift, or mass effect. No extra-axial fluid collection. No hydrocephalus.  Scalp soft tissues within normal limits. No acute abnormality about the orbits.  Calvarium intact.  Chronic mucosal thickening present within the right sphenoid sinus. Mild mucosal thickening within the max O sinuses bilaterally, likely chronic. Paranasal sinuses are otherwise clear. No mastoid effusion.  CT CERVICAL SPINE FINDINGS  Trace anterolisthesis of C6 on C7, likely chronic. Otherwise, vertebral bodies are normally aligned with preservation of the normal cervical lordosis. Vertebral body heights are preserved. Normal C1-2 articulations are intact. No prevertebral soft tissue  swelling. No acute fracture or listhesis.  Advanced multilevel degenerative disc disease and facet arthropathy present throughout the cervical spine, most prevalent at C5-6 and C6-7.  No acute soft tissue abnormality. Prominent vascular calcifications present about the carotid bifurcations.  Visualized lung apices are clear without evidence of apical pneumothorax. Large amount a debris present within the distal esophagus.  IMPRESSION: CT BRAIN:  1. No acute intracranial process. 2. Generalized cerebral atrophy with chronic microvascular ischemic disease and small remote lacunar infarcts within the bilateral basal ganglia.  CT CERVICAL SPINE:  1. No acute traumatic injury within the cervical spine. 2. Large amount of debris within the partially visualized upper esophagus. This patient is likely at risk for aspiration.   Electronically Signed   By: Jeannine Boga M.D.   On: 07/02/2015 05:00   Dg Chest  Port 1 View  07/08/2015   CLINICAL DATA:  Increased shortness of breath and weakness today. COPD.  EXAM: PORTABLE CHEST - 1 VIEW  COMPARISON:  07/03/2015  FINDINGS: Cardiomegaly stable. Diffuse interstitial prominence and bibasilar airspace disease shows no significant change, suspicious for diffuse pulmonary edema. Small bilateral pleural effusions cannot be excluded. No evidence of pneumothorax.  IMPRESSION: Stable cardiomegaly and symmetric interstitial and bibasilar airspace disease, suspicious for diffuse pulmonary edema/congestive heart failure. Probable small bilateral pleural effusions.   Electronically Signed   By: Earle Gell M.D.   On: 07/08/2015 19:08    ASSESSMENT AND PLAN Frail 81 year old Caucasian male was past medical history of COPD, PAD, chronic LE venous insufficiency, stage III CKD, h/o L RAS s/p L renal artery stent, and MAT controlled on diltiazem who was recently admitted from 7/14-7/16 discharged after diuresis, came back on 7/20 with increasing dyspnea, fever and cough. Also had 2  mechanical falls.  1. Acute dyspnea possibly related to HCAP and HF? - Diuresed 1.1 L and 1lb weight loss  in last 24 hours. BNP increased to 215.9. No JVD. He basically appears euvolemic except faint bibasilar rales, likely due to HCAP. Consider switching to PO lasix.   2. Moderate to severe AS - Echo 07/03/2015 EF 60-65%, akinesis of basal inferior myocardium, moderate to severe AS which has progressed since the last echo, ascending aortic diameter 41 mm - outpatient f/u for possible TAVR discussion  3. MAT - Continue diltiazem  4. Mild elevated troponin - flat trend, likely due to acute illness  5. Hypokalemia supplement given  6. HL - Switched zocor to lipitor due to possible interaction with dilt. appreciates pharmacy input.   7. PAD with chronic R foot ulcer - bilateral ace wrap  8. Acute on chronic kidney disease, stage III - creatinine improve  9. Diffuse abdominal pain - consider daily bowel regimen. Management per primary.   Jarrett Soho PA-C Pager [phone]  Patient seen and examined and history reviewed. Agree with above findings and plan. He is feeling better today. Wants to go home. Very winded and weak just getting up to bathroom. Reports cough is better. On Exam still has coarse bilateral rhochi/ crackles. Fair diuretic response on IV lasix. HR under fair control. Would consider switching lasix to po. Patient states that he couldn't take 80 mg (" it causes damage to my kidneys") I would resume at 40 mg bid. Monitor renal function. Not a candidate for TAVR at this point. Could reconsider as outpatient once acute problems resolved.  Peter Martinique, [place] 07/10/2015 9:30 AM

## 2015-07-10 NOTE — Care Management Note (Signed)
Case Management Note  Patient Details  Name: [name] MRN: [id number] Date of Birth: [birth year]  Subjective/Objective:    Pt admitted with Sepsis              Action/Plan: From Home with wife, plan to return home [place] for [facility].   Expected Discharge Date:   (unknown)               Expected Discharge Plan:  [place]  In-House Referral:     Discharge planning Services  CM Consult  Post Acute Care Choice:    Choice offered to:     DME Arranged:    DME Agency:     HH Arranged:  PT HH Agency:  [facility]  Status of Service:  In process, will continue to follow  Medicare Important Message Given:  Yes-second notification given Date Medicare IM Given:    Medicare IM give by:    Date Additional Medicare IM Given:    Additional Medicare Important Message give by:     If discussed at [place] of Stay Meetings, dates discussed:    Additional CommentsPurcell Mouton, RN 07/10/2015, 3:24 PM

## 2015-07-10 NOTE — Care Management Important Message (Signed)
Important Message  Patient Details  Name: [name] MRN: [id number] Date of Birth: Dec 24, 1928   Medicare Important Message Given:  Yes-second notification given    Shelda Altes 07/10/2015, 2:04 [place] Message  Patient Details  Name: [name] MRN: [id number] Date of Birth: 04/16/1929   Medicare Important Message Given:  Yes-second notification given    Shelda Altes 07/10/2015, 2:04 PM

## 2015-07-10 NOTE — Evaluation (Signed)
Physical Therapy Evaluation Patient Details Name: [name] MRN: [id number] DOB: 1929/10/03 Today's Date: 07/10/2015   History of Present Illness  81 year old male was past medical history of COPD, PAD, chronic LE venous insufficiency, stage III CKD, h/o L RAS s/p L renal artery stent, and MAT controlled on diltiazem who was recently admitted from 7/14-7/16 discharged after diuresis, readmitted on 7/20 with increasing dyspnea, fever and cough as well as had 2 mechanical falls.  Clinical Impression  Pt admitted with above diagnosis. Pt currently with functional limitations due to the deficits listed below (see PT Problem List).  Pt will benefit from skilled PT to increase their independence and safety with mobility to allow discharge to the venue listed below.   Pt reports feeling very fatigued and SOB, only able to tolerate ambulating to/from bathroom however asking if he can go home today.  Pt states his wife may not be able to assist him at home ("she's as old as me") so if 24/7 assist not possible, may benefit from SNF, if pt agrees.     Follow Up Recommendations Home health PT;Supervision/Assistance - 24 hour    Equipment Recommendations  None recommended by PT    Recommendations for Other Services       Precautions / Restrictions Precautions Precautions: Fall Precaution Comments: monitor vitals Restrictions Weight Bearing Restrictions: No      Mobility  Bed Mobility Overal bed mobility: Needs Assistance Bed Mobility: Supine to Sit;Sit to Supine     Supine to sit: Supervision;HOB elevated Sit to supine: Supervision   General bed mobility comments: Supervision for safety with lines  Transfers Overall transfer level: Needs assistance Equipment used: None Transfers: Sit to/from Stand Sit to Stand: Min guard         General transfer comment: min/guard for safety, verbal cues for hand placement, declined using RW, urgency to use  bathroom  Ambulation/Gait Ambulation/Gait assistance: Min guard Ambulation Distance (Feet): 12 Feet Assistive device: None Gait Pattern/deviations: Step-through pattern;Decreased stride length;Trunk flexed     General Gait Details: verbal cues for safety, pt reaching for foot board and using IV pole to steady while returning to bed from bathroom, increased SOB upon return to bed with HR 126 (on telemetry) and SPO2 86% quickly increasing to 92% room air (? initial accuracy)  Stairs            Wheelchair Mobility    Modified Rankin (Stroke Patients Only)       Balance           Standing balance support: During functional activity Standing balance-Leahy Scale: Poor Standing balance comment: declined RW however required UEs to assist with steadying                             Pertinent Vitals/Pain Pain Assessment: No/denies pain    Home Living Family/patient expects to be discharged to:: Private residence Living Arrangements: Spouse/significant other Available Help at Discharge: Family Type of Home: House Home Access: Stairs to enter Entrance Stairs-Rails: Psychiatric nurse of Steps: 3 Home Layout: One level Home Equipment: Environmental consultant - 2 wheels;Cane - single point;Shower seat;Bedside commode      Prior Function Level of Independence: Needs assistance   Gait / Transfers Assistance Needed: ambulates with RW  ADL's / Homemaking Assistance Needed: wife assists with LB bathing and dressing        Hand Dominance   Dominant Hand: Right    Extremity/Trunk Assessment  Lower Extremity Assessment: Generalized weakness (bil UNNA boots)         Communication   Communication: HOH  Cognition Arousal/Alertness: Awake/alert Behavior During Therapy: WFL for tasks assessed/performed Overall Cognitive Status: Within Functional Limits for tasks assessed                      General Comments      Exercises         Assessment/Plan    PT Assessment Patient needs continued PT services  PT Diagnosis Difficulty walking;Generalized weakness   PT Problem List Decreased strength;Cardiopulmonary status limiting activity;Decreased activity tolerance;Decreased balance;Decreased mobility;Decreased safety awareness  PT Treatment Interventions Balance training;Gait training;Functional mobility training;Therapeutic activities;Therapeutic exercise;Patient/family education;Stair training;DME instruction   PT Goals (Current goals can be found in the Care Plan section) Acute Rehab PT Goals Patient Stated Goal: wishes to d/c home PT Goal Formulation: With patient Time For Goal Achievement: 07/17/15 Potential to Achieve Goals: Good    Frequency Min 3X/week   Barriers to discharge        Co-evaluation               End of Session Equipment Utilized During Treatment: Gait belt Activity Tolerance: Patient limited by fatigue Patient left: with call bell/phone within reach;in bed;with bed alarm set           Time: 0911-0927 PT Time Calculation (min) (ACUTE ONLY): 16 min   Charges:   PT Evaluation $Initial PT Evaluation Tier I: 1 Procedure     PT G Codes:        Lomax Poehler,KATHrine E 07/10/2015, 11:28 AM Carmelia Bake, PT, DPT 07/10/2015 Pager: [phone]

## 2015-07-11 LAB — BASIC METABOLIC PANEL
Anion gap: 12 (ref 5–15)
BUN: 30 mg/dL — AB (ref 6–20)
CALCIUM: 8.6 mg/dL — AB (ref 8.9–10.3)
CO2: 29 mmol/L (ref 22–32)
Chloride: 97 mmol/L — ABNORMAL LOW (ref 101–111)
Creatinine, Ser: 1.68 mg/dL — ABNORMAL HIGH (ref 0.61–1.24)
GFR calc Af Amer: 41 mL/min — ABNORMAL LOW (ref >60)
GFR, EST NON AFRICAN AMERICAN: 36 mL/min — AB (ref >60)
Glucose, Bld: 159 mg/dL — ABNORMAL HIGH (ref 65–99)
Potassium: 3.4 mmol/L — ABNORMAL LOW (ref 3.5–5.1)
SODIUM: 138 mmol/L (ref 135–145)

## 2015-07-11 LAB — CBC WITH DIFFERENTIAL/PLATELET
BASOS ABS: 0 10*3/uL (ref 0.0–0.1)
Basophils Relative: 0 % (ref 0–1)
Eosinophils Absolute: 0.2 10*3/uL (ref 0.0–0.7)
Eosinophils Relative: 2 % (ref 0–5)
HCT: 39.4 % (ref 39.0–52.0)
Hemoglobin: 12.8 g/dL — ABNORMAL LOW (ref 13.0–17.0)
LYMPHS PCT: 9 % — AB (ref 12–46)
Lymphs Abs: 1.2 10*3/uL (ref 0.7–4.0)
MCH: 26.3 pg (ref 26.0–34.0)
MCHC: 32.5 g/dL (ref 30.0–36.0)
MCV: 80.9 fL (ref 78.0–100.0)
MONO ABS: 0.9 10*3/uL (ref 0.1–1.0)
Monocytes Relative: 7 % (ref 3–12)
NEUTROS ABS: 10.6 10*3/uL — AB (ref 1.7–7.7)
NEUTROS PCT: 82 % — AB (ref 43–77)
PLATELETS: 216 10*3/uL (ref 150–400)
RBC: 4.87 MIL/uL (ref 4.22–5.81)
RDW: 15.4 % (ref 11.5–15.5)
WBC: 12.8 10*3/uL — ABNORMAL HIGH (ref 4.0–10.5)

## 2015-07-11 LAB — TROPONIN I
TROPONIN I: 0.04 ng/mL — AB (ref <0.031)
TROPONIN I: 0.03 ng/mL (ref <0.031)
Troponin I: 0.03 ng/mL (ref <0.031)
Troponin I: 0.04 ng/mL — ABNORMAL HIGH (ref <0.031)

## 2015-07-11 MED ORDER — CLINDAMYCIN HCL 300 MG PO CAPS
300.0000 mg | ORAL_CAPSULE | Freq: Three times a day (TID) | ORAL | Status: DC
  Administered 2015-07-11 – 2015-07-13 (×6): 300 mg via ORAL
  Filled 2015-07-11 (×6): qty 1

## 2015-07-11 MED ORDER — LORAZEPAM 2 MG/ML IJ SOLN: 1.0000 mg | Freq: Once | INTRAMUSCULAR | Status: DC

## 2015-07-11 NOTE — Progress Notes (Signed)
[name] LOV:[id number] DOB: Nov 09, 1929 DOA: 07/08/2015 PCP: [name], MD  Brief narrative: 81 y/o ? prior heavy smoker c COPDM, POVD + Occ SFA-Calcaneal Osteo c Group B strep Bacteremia/Septic shock-completed IV Abx Nov26/2016 and now on Suppressive Amoxicillin 500 bid [foll by Dr. Tommy Medal ID, Dr. [facility]], CKD III, prior MAT c PVC, Sacral decub h/o, Morbid obesity, Body mass index is 26.9 kg/(m^2)., Mo AoS, RLS, Bipolar on meds admitted 07/08/15 with SOB CXR revealed potential PNA  Past medical history-As per Problem list Chart reviewed as below-   Consultants:  None yet  Procedures:    Antibiotics:  Vanc 7/20--7/23  Zosyn 7/20--7/23  Augmentin 7/23   Subjective   Fair Looks better Poor appetite "i wanna go home" No cp RN reports HR ? ~ 140 c activity relates has a sick son staying with them    Objective    Interim History:   Telemetry:    Objective: Filed Vitals:   07/10/15 2058 07/11/15 0510 07/11/15 0616 07/11/15 0852  BP: 98/79 174/87    Pulse: 72 85    Temp: 97.8 F (36.6 C) 97.8 F (36.6 C)    TempSrc: Oral Oral    Resp: 20 20    Height:      Weight:   90 kg (198 lb 6.6 oz)   SpO2: 95% 95%  95%    Intake/Output Summary (Last 24 hours) at 07/11/15 1432 Last data filed at 07/11/15 1300  Gross per 24 hour  Intake    660 ml  Output    425 ml  Net    235 ml    Exam:  General: eomi ncat Cardiovascular: s1 s 2no m/r/g Respiratory: crackles bilaterally, no rales Abdomen: soft, non tedner non distended Skin le swelling bilaterally.   stg2 chronic decubitus  Data Reviewed: Basic Metabolic Panel:  Recent Labs Lab 07/08/15 1835 07/08/15 2230 07/09/15 0425 07/10/15 0423 07/11/15 1004  NA 134*  --  137 135 138  K 4.2  --  3.9 2.8* 3.4*  CL 95*  --  98* 93* 97*  CO2 26  --  30 30 29   GLUCOSE 188*  --  149* 147* 159*  BUN 32*  --  33* 31* 30*  CREATININE 1.97* 1.93* 1.96* 1.74* 1.68*  CALCIUM 8.6*  --  8.5* 8.1*  8.6*   Liver Function Tests:  Recent Labs Lab 07/08/15 1835 07/10/15 0423  AST 20 16  ALT 16* 12*  ALKPHOS 75 71  BILITOT 0.8 1.1  PROT 7.1 6.8  ALBUMIN 3.2* 3.0*   No results for input(s): LIPASE, AMYLASE in the last 168 hours. No results for input(s): AMMONIA in the last 168 hours. CBC:  Recent Labs Lab 07/08/15 1835 07/08/15 2230 07/09/15 0425 07/10/15 0423  WBC 17.6* 20.4* 16.7* 15.3*  NEUTROABS 16.3*  --   --  13.2*  HGB 13.4 12.6* 12.5* 12.1*  HCT 41.6 39.3 39.2 37.0*  MCV 82.7 83.1 82.0 81.1  PLT 173 160 148* 185   Cardiac Enzymes:  Recent Labs Lab 07/10/15 0945 07/10/15 1550 07/10/15 2210 07/11/15 0357 07/11/15 1004  TROPONINI 0.03 0.03 0.03 0.04* 0.03   BNP: Invalid input(s): POCBNP CBG: No results for input(s): GLUCAP in the last 168 hours.  Recent Results (from the past 240 hour(s))  Urine culture     Status: None   Collection Time: 07/02/15  4:40 AM  Result Value Ref Range Status   Specimen Description URINE, CATHETERIZED  Final   Special  Requests NONE  Final   Culture NO GROWTH 1 DAY  Final   Report Status 07/03/2015 FINAL  Final  Blood culture (routine x 2)     Status: None   Collection Time: 07/02/15  5:25 AM  Result Value Ref Range Status   Specimen Description BLOOD FOREARM RIGHT  Final   Special Requests BOTTLES DRAWN AEROBIC AND ANAEROBIC 5CC  Final   Culture NO GROWTH 5 DAYS  Final   Report Status 07/07/2015 FINAL  Final  Blood culture (routine x 2)     Status: None   Collection Time: 07/02/15  5:30 AM  Result Value Ref Range Status   Specimen Description BLOOD HAND RIGHT  Final   Special Requests BOTTLES DRAWN AEROBIC AND ANAEROBIC 5CC  Final   Culture NO GROWTH 5 DAYS  Final   Report Status 07/07/2015 FINAL  Final  Blood Culture (routine x 2)     Status: None (Preliminary result)   Collection Time: 07/08/15  6:21 PM  Result Value Ref Range Status   Specimen Description BLOOD BLOOD RIGHT FOREARM  Final   Special Requests  BOTTLES DRAWN AEROBIC AND ANAEROBIC 5CC  Final   Culture   Final    NO GROWTH 3 DAYS Performed at [facility]    Report Status PENDING  Incomplete  Urine culture     Status: None   Collection Time: 07/08/15  6:21 PM  Result Value Ref Range Status   Specimen Description URINE, CLEAN CATCH  Final   Special Requests NONE  Final   Culture   Final    MULTIPLE SPECIES PRESENT, SUGGEST RECOLLECTION Performed at [facility]    Report Status 07/10/2015 FINAL  Final  Blood Culture (routine x 2)     Status: None (Preliminary result)   Collection Time: 07/08/15  6:39 PM  Result Value Ref Range Status   Specimen Description BLOOD BLOOD LEFT HAND  Final   Special Requests BOTTLES DRAWN AEROBIC AND ANAEROBIC 3CC  Final   Culture   Final    NO GROWTH 3 DAYS Performed at [facility]    Report Status PENDING  Incomplete     Studies:              All Imaging reviewed and is as per above notation   Scheduled Meds: . aspirin EC  81 mg Oral q morning - 10a  . atorvastatin  20 mg Oral QHS  . carbidopa-levodopa  1 tablet Oral QPM  . clindamycin  300 mg Oral 3 times per day  . diltiazem  360 mg Oral Daily  . enoxaparin (LOVENOX) injection  40 mg Subcutaneous QHS  . feeding supplement (ENSURE ENLIVE)  237 mL Oral BID BM  . feeding supplement (PRO-STAT SUGAR FREE 64)  30 mL Oral BID  . furosemide  40 mg Oral BID  . levothyroxine  112 mcg Oral QAC breakfast  . linagliptin  5 mg Oral Daily  . LORazepam  0.5 mg Oral TID  . mometasone-formoterol  2 puff Inhalation BID  . potassium chloride  40 mEq Oral TID  . QUEtiapine  25 mg Oral QHS  . sertraline  100 mg Oral QPM  . sodium chloride  3 mL Intravenous Q12H   Continuous Infusions:    Assessment/Plan:  1 . Sepsis 2/2 HCAP -differential diagnosis infected foot ulcer [unlikely] versus pyelonephritis -much improved - Vanc, zosyn changed to PO augmentin to complete ~10 days PO Aspiration coverage -Would consider him  relatively immunocompromised  as is on chronic suppressive amoxicillin 500 twice a day -Holding on IVF given hx of volume overload and elevated BNP .  -Cultures pending (blood) 7/20, urine 7/20 NG so far and Urine multiple organisms -Repeat lactate is trending downwards from 2.56-->1.5  -No further temperature -d/w Dr. Johnnye Sima ID briefly who recs complete HCAP course and revert to Amoxil later  2 . Hypoxic resp failure, multifactorial secondary to decompensated heart failure diastolic with component Aos Severe, probable pneumonia  - 2/2 HCAP + DD with Chr CHF -was on supplemental O2 Desats screen as needed-improved -I'm not sure that he is compliant on his by mouth home Lasix per last discharge summary on 7/14 by Dr. [place] Pink -He will need extensive education -Lasix now 40 po BId 7/22 per Cardiology -no JVD  3. B/L LE wounds - wound consult placed  -Wound looks clean  4. Severe AS on TTE 07/03/15  -NO SALINE -I/O inaccurate  5. Demand ischemia  - trend of troponin is flat -stop chekcing for now  6. History of MAT/PVCs -Continue Cardizem 360 daily  -HR 140's but non sustained -tele shows -stable  7. Bipolar- -Continue lorazepam 0.5 3 times a day, Seroquel 25 daily at bedtime, sertraline 100 every afternoon  8. Diabetes mellitus type II Continuetradjneta mg daily. A.m. blood sugar checks  9-hypothyroidism -Consider outpatient TSH -Continue Synthroid 112 g daily  Moderate non severe malnutrition -per Nutritionist  Code Status: Presumed full  Family Communication: d/w wife 7/23 {Patient INSISTENT on going home-IO think he is high risk for readmission We will probably reach stability in 24-48 hrs and can d/c home then Disposition Plan: Inpatient pending resolution-improving  Verneita Griffes, MD  [facility] Pager [phone] 07/11/2015, 2:32 PM    LOS: 3 days

## 2015-07-11 NOTE — Progress Notes (Signed)
ANTIBIOTIC CONSULT NOTE - Follow up  Pharmacy Consult for vancomycin and zosyn Indication: sepsis and pna  No Known Allergies  Patient Measurements: Height: 6' (182.9 cm) Weight: 198 lb 6.6 oz (90 kg) IBW/kg (Calculated) : 77.6   Vital Signs: Temp: 97.8 F (36.6 C) (07/23 0510) Temp Source: Oral (07/23 0510) BP: 174/87 mmHg (07/23 0510) Pulse Rate: 85 (07/23 0510) Intake/Output from previous day: 07/22 0701 - 07/23 0700 In: 950 [P.O.:480; I.V.:120; IV Piggyback:350] Out: 425 [Urine:425] Intake/Output from this shift: Total I/O In: 120 [P.O.:120] Out: -   Labs:  Recent Labs  07/08/15 2230 07/09/15 0425 07/10/15 0423  WBC 20.4* 16.7* 15.3*  HGB 12.6* 12.5* 12.1*  PLT 160 148* 185  CREATININE 1.93* 1.96* 1.74*   Estimated Creatinine Clearance: 34.7 mL/min (by C-G formula based on Cr of 1.74). No results for input(s): VANCOTROUGH, VANCOPEAK, VANCORANDOM, GENTTROUGH, GENTPEAK, GENTRANDOM, TOBRATROUGH, TOBRAPEAK, TOBRARND, AMIKACINPEAK, AMIKACINTROU, AMIKACIN in the last 72 hours.   Microbiology: Recent Results (from the past 720 hour(s))  Urine culture     Status: None   Collection Time: 07/02/15  4:40 AM  Result Value Ref Range Status   Specimen Description URINE, CATHETERIZED  Final   Special Requests NONE  Final   Culture NO GROWTH 1 DAY  Final   Report Status 07/03/2015 FINAL  Final  Blood culture (routine x 2)     Status: None   Collection Time: 07/02/15  5:25 AM  Result Value Ref Range Status   Specimen Description BLOOD FOREARM RIGHT  Final   Special Requests BOTTLES DRAWN AEROBIC AND ANAEROBIC 5CC  Final   Culture NO GROWTH 5 DAYS  Final   Report Status 07/07/2015 FINAL  Final  Blood culture (routine x 2)     Status: None   Collection Time: 07/02/15  5:30 AM  Result Value Ref Range Status   Specimen Description BLOOD HAND RIGHT  Final   Special Requests BOTTLES DRAWN AEROBIC AND ANAEROBIC 5CC  Final   Culture NO GROWTH 5 DAYS  Final   Report Status  07/07/2015 FINAL  Final  Blood Culture (routine x 2)     Status: None (Preliminary result)   Collection Time: 07/08/15  6:21 PM  Result Value Ref Range Status   Specimen Description BLOOD BLOOD RIGHT FOREARM  Final   Special Requests BOTTLES DRAWN AEROBIC AND ANAEROBIC 5CC  Final   Culture   Final    NO GROWTH 2 DAYS Performed at [facility]    Report Status PENDING  Incomplete  Urine culture     Status: None   Collection Time: 07/08/15  6:21 PM  Result Value Ref Range Status   Specimen Description URINE, CLEAN CATCH  Final   Special Requests NONE  Final   Culture   Final    MULTIPLE SPECIES PRESENT, SUGGEST RECOLLECTION Performed at [facility]    Report Status 07/10/2015 FINAL  Final  Blood Culture (routine x 2)     Status: None (Preliminary result)   Collection Time: 07/08/15  6:39 PM  Result Value Ref Range Status   Specimen Description BLOOD BLOOD LEFT HAND  Final   Special Requests BOTTLES DRAWN AEROBIC AND ANAEROBIC 3CC  Final   Culture   Final    NO GROWTH 2 DAYS Performed at [facility]    Report Status PENDING  Incomplete   Assessment: 81 year old Caucasian male with a past medical history of COPD, peripheral vascular disease, chronic lower extremity venous stasis with chronic heel  wound, chronic kidney disease stage III. He returns after discharge 4 days prior due to increasing falls and weakness. Found to have sepsis 2/2 HCAP based on fever, leukocytosis and PNA on CXR . In ED he was treated w/ IV vanc/zosyn and IVF.  Pharmacy consulted to dose vanc and zosyn for sepsis 2/2 HCAP.  7/20 >> Vanc >> 7/20 >> Zosyn >>    7/20 bcx x2: ngtd 7/20 urine: multi species FINAL  Today, 07/11/2015: Afeb, wbc elevated but trending down Renal: CKD stage 3, SCr improving towards basesline(baseline ~1.5); CrCl 35 CG.  Goal of Therapy:  Vancomycin trough level 15-20 mcg/ml Appropriate antibiotic dosing for renal function; eradication of  infection  Plan: Cont  Vanc 1g IV q24h Cont Zosyn 3.375g IV Q8H infused over 4hrs. Follow renal function, cultures, clinical course Check Vanc trough tonight.  Romeo Rabon, PharmD, pager [phone]. 07/11/2015,10:23 AM.

## 2015-07-11 NOTE — Progress Notes (Signed)
Patient Name: [name] Date of Encounter: 07/11/2015  Principal Problem:   Sepsis due to pneumonia Active Problems:   Chronic kidney disease, stage III (moderate)   PAD (peripheral artery disease)   Multifocal atrial tachycardia   Aortic stenosis   Chronic diastolic CHF (congestive heart failure)  SUBJECTIVE  Feels weak and tired. Wants to go home. Denies chest pain or sob. Complaining of mild diffuse abdominal discomfort.   CURRENT MEDS . aspirin EC  81 mg Oral q morning - 10a  . atorvastatin  20 mg Oral QHS  . carbidopa-levodopa  1 tablet Oral QPM  . diltiazem  360 mg Oral Daily  . enoxaparin (LOVENOX) injection  40 mg Subcutaneous QHS  . feeding supplement (ENSURE ENLIVE)  237 mL Oral BID BM  . feeding supplement (PRO-STAT SUGAR FREE 64)  30 mL Oral BID  . furosemide  40 mg Oral BID  . levothyroxine  112 mcg Oral QAC breakfast  . linagliptin  5 mg Oral Daily  . LORazepam  0.5 mg Oral TID  . mometasone-formoterol  2 puff Inhalation BID  . piperacillin-tazobactam (ZOSYN)  IV  3.375 g Intravenous Q8H  . potassium chloride  40 mEq Oral TID  . QUEtiapine  25 mg Oral QHS  . sertraline  100 mg Oral QPM  . sodium chloride  3 mL Intravenous Q12H  . vancomycin  1,000 mg Intravenous Q24H    OBJECTIVE  Filed Vitals:   07/10/15 2012 07/10/15 2058 07/11/15 0510 07/11/15 0616  BP:  98/79 174/87   Pulse:  72 85   Temp:  97.8 F (36.6 C) 97.8 F (36.6 C)   TempSrc:  Oral Oral   Resp:  20 20   Height:      Weight:    90 kg (198 lb 6.6 oz)  SpO2: 95% 95% 95%     Intake/Output Summary (Last 24 hours) at 07/11/15 0750 Last data filed at 07/11/15 0600  Gross per 24 hour  Intake    950 ml  Output    425 ml  Net    525 ml   Filed Weights   07/09/15 0624 07/10/15 0524 07/11/15 0616  Weight: 93.26 kg (205 lb 9.6 oz) 92.8 kg (204 lb 9.4 oz) 90 kg (198 lb 6.6 oz)    PHYSICAL EXAM  General: frail ill appearing man.  Neuro: Alert and oriented X 3. Moves all  extremities spontaneously. Psych: Normal affect. HEENT:  Normal  Neck: Supple without bruit. Bibasilar faint rales with rhonchi throughout.  Lungs:  Resp regular and unlabored, CTA. Heart: irregular tachycardia no s3, s4. SEM murmurs. Abdomen: Soft, diffuse tender to palpation, non-distended, BS + x 4.  Extremities: No clubbing, cyanosis or edema. DP/PT/Radials 2+ and equal bilaterally. ACE wrap bilaterally.   Accessory Clinical Findings  CBC  Recent Labs  07/08/15 1835  07/09/15 0425 07/10/15 0423  WBC 17.6*  < > 16.7* 15.3*  NEUTROABS 16.3*  --   --  13.2*  HGB 13.4  < > 12.5* 12.1*  HCT 41.6  < > 39.2 37.0*  MCV 82.7  < > 82.0 81.1  PLT 173  < > 148* 185  < > = values in this interval not displayed. Basic Metabolic Panel  Recent Labs  07/09/15 0425 07/10/15 0423  NA 137 135  K 3.9 2.8*  CL 98* 93*  CO2 30 30  GLUCOSE 149* 147*  BUN 33* 31*  CREATININE 1.96* 1.74*  CALCIUM 8.5* 8.1*   Liver Function  Tests  Recent Labs  07/08/15 1835 07/10/15 0423  AST 20 16  ALT 16* 12*  ALKPHOS 75 71  BILITOT 0.8 1.1  PROT 7.1 6.8  ALBUMIN 3.2* 3.0*   No results for input(s): LIPASE, AMYLASE in the last 72 hours. Cardiac Enzymes  Recent Labs  07/10/15 1550 07/10/15 2210 07/11/15 0357  TROPONINI 0.03 0.03 0.04*    TELE  Irregular at rate of 90s-110s. Frequent PVCs.   Radiology/Studies  Dg Chest 2 View  07/10/2015   CLINICAL DATA:  Weakness and shortness of breath and lower extremity discomfort, history of chronic CHF and aortic stenosis and COPD recently discontinued smoking.  EXAM: CHEST  2 VIEW  COMPARISON:  Portable chest x-ray of July 08, 2015 and PA and lateral chest x-ray of July 03, 2015.  FINDINGS: The lungs are reasonably well inflated. The interstitial markings remain increased bilaterally but have improved. There is bibasilar subsegmental atelectasis or infiltrate. There may be a trace of pleural fluid layering posteriorly. The cardiac silhouette  remains enlarged. The pulmonary vascularity is less engorged today. The mediastinum is normal in width. The shoulders are unremarkable.  IMPRESSION: 1. Interval improvement in the appearance of the pulmonary interstitium consistent with resolving pulmonary edema. Cardiomegaly. 2. Bibasilar atelectasis or infiltrate superimposed upon pulmonary fibrotic changes, stable.   Electronically Signed   By: David  Martinique M.D.   On: 07/10/2015 09:05   Dg Chest 2 View  07/03/2015   CLINICAL DATA:  Evaluate pulmonary edema.  EXAM: CHEST  2 VIEW  COMPARISON:  Chest radiograph 07/02/2015.  FINDINGS: Monitoring leads overlie the patient. Stable cardiomegaly. Mild interval improvement pulmonary vascular redistribution and mid and lower lung interstitial opacities. Small bilateral pleural effusions. Mid thoracic spine degenerative changes.  IMPRESSION: Cardiomegaly with slight interval improvement pulmonary edema.   Electronically Signed   By: Lovey Newcomer M.D.   On: 07/03/2015 13:02   Dg Chest 2 View  07/02/2015   CLINICAL DATA:  Shortness of breath  EXAM: CHEST  2 VIEW  COMPARISON:  10/09/2014  FINDINGS: Cardiac enlargement. Pulmonary vascular congestion. Interstitial edema in the lung bases. No blunting of costophrenic angles. No pneumothorax. Calcified and tortuous aorta with ectasia. Degenerative changes in the spine.  IMPRESSION: Cardiac enlargement with mild pulmonary vascular congestion and basilar edema.   Electronically Signed   By: Lucienne Capers M.D.   On: 07/02/2015 03:22   Dg Lumbar Spine Complete  07/02/2015   CLINICAL DATA:  Shortness of breath tonight. [employer]. Low back pain. Pain both hips.  EXAM: LUMBAR SPINE - COMPLETE 4+ VIEW  COMPARISON:  None.  FINDINGS: Normal alignment of the lumbar spine. Degenerative changes throughout with bridging anterior osteophytes. No vertebral compression deformities. No focal bone lesion or bone destruction. Bone cortex and trabecular architecture appear intact.  Vascular calcifications.  IMPRESSION: Degenerative changes in the lumbar spine. Normal alignment. No acute fractures identified.   Electronically Signed   By: Lucienne Capers M.D.   On: 07/02/2015 03:24   Dg Pelvis 1-2 Views  07/02/2015   CLINICAL DATA:  Shortness of breath tonight. Fall tonight. Low back pain. Pain both hips.  EXAM: PELVIS - 1-2 VIEW  COMPARISON:  None.  FINDINGS: There is no evidence of pelvic fracture or diastasis. No pelvic bone lesions are seen. Degenerative changes in both hips. Surgical clips in the pelvis. Vascular calcifications.  IMPRESSION: No acute bony abnormalities.   Electronically Signed   By: Lucienne Capers M.D.   On: 07/02/2015 03:22   Ct  Head Wo Contrast  07/02/2015   CLINICAL DATA:  Initial evaluation for acute trauma, fall.  EXAM: CT HEAD WITHOUT CONTRAST  CT CERVICAL SPINE WITHOUT CONTRAST  TECHNIQUE: Multidetector CT imaging of the head and cervical spine was performed following the standard protocol without intravenous contrast. Multiplanar CT image reconstructions of the cervical spine were also generated.  COMPARISON:  None.  FINDINGS: CT HEAD FINDINGS  Diffuse prominence of the CSF containing spaces is compatible with generalized cerebral atrophy. Patchy and confluent hypodensity within the periventricular and deep white matter both cerebral hemispheres present, most consistent with chronic small vessel ischemic disease. Small remote lacunar infarct present within the bilateral basal ganglia. Prominent vascular calcifications present within the carotid siphons and distal vertebral arteries.  No acute large vessel territory infarct. No intracranial hemorrhage. No mass lesion, midline shift, or mass effect. No extra-axial fluid collection. No hydrocephalus.  Scalp soft tissues within normal limits. No acute abnormality about the orbits.  Calvarium intact.  Chronic mucosal thickening present within the right sphenoid sinus. Mild mucosal thickening within the max O  sinuses bilaterally, likely chronic. Paranasal sinuses are otherwise clear. No mastoid effusion.  CT CERVICAL SPINE FINDINGS  Trace anterolisthesis of C6 on C7, likely chronic. Otherwise, vertebral bodies are normally aligned with preservation of the normal cervical lordosis. Vertebral body heights are preserved. Normal C1-2 articulations are intact. No prevertebral soft tissue swelling. No acute fracture or listhesis.  Advanced multilevel degenerative disc disease and facet arthropathy present throughout the cervical spine, most prevalent at C5-6 and C6-7.  No acute soft tissue abnormality. Prominent vascular calcifications present about the carotid bifurcations.  Visualized lung apices are clear without evidence of apical pneumothorax. Large amount a debris present within the distal esophagus.  IMPRESSION: CT BRAIN:  1. No acute intracranial process. 2. Generalized cerebral atrophy with chronic microvascular ischemic disease and small remote lacunar infarcts within the bilateral basal ganglia.  CT CERVICAL SPINE:  1. No acute traumatic injury within the cervical spine. 2. Large amount of debris within the partially visualized upper esophagus. This patient is likely at risk for aspiration.   Electronically Signed   By: Jeannine Boga M.D.   On: 07/02/2015 05:00   Ct Cervical Spine Wo Contrast  07/02/2015   CLINICAL DATA:  Initial evaluation for acute trauma, fall.  EXAM: CT HEAD WITHOUT CONTRAST  CT CERVICAL SPINE WITHOUT CONTRAST  TECHNIQUE: Multidetector CT imaging of the head and cervical spine was performed following the standard protocol without intravenous contrast. Multiplanar CT image reconstructions of the cervical spine were also generated.  COMPARISON:  None.  FINDINGS: CT HEAD FINDINGS  Diffuse prominence of the CSF containing spaces is compatible with generalized cerebral atrophy. Patchy and confluent hypodensity within the periventricular and deep white matter both cerebral hemispheres  present, most consistent with chronic small vessel ischemic disease. Small remote lacunar infarct present within the bilateral basal ganglia. Prominent vascular calcifications present within the carotid siphons and distal vertebral arteries.  No acute large vessel territory infarct. No intracranial hemorrhage. No mass lesion, midline shift, or mass effect. No extra-axial fluid collection. No hydrocephalus.  Scalp soft tissues within normal limits. No acute abnormality about the orbits.  Calvarium intact.  Chronic mucosal thickening present within the right sphenoid sinus. Mild mucosal thickening within the max O sinuses bilaterally, likely chronic. Paranasal sinuses are otherwise clear. No mastoid effusion.  CT CERVICAL SPINE FINDINGS  Trace anterolisthesis of C6 on C7, likely chronic. Otherwise, vertebral bodies are normally aligned with preservation of  the normal cervical lordosis. Vertebral body heights are preserved. Normal C1-2 articulations are intact. No prevertebral soft tissue swelling. No acute fracture or listhesis.  Advanced multilevel degenerative disc disease and facet arthropathy present throughout the cervical spine, most prevalent at C5-6 and C6-7.  No acute soft tissue abnormality. Prominent vascular calcifications present about the carotid bifurcations.  Visualized lung apices are clear without evidence of apical pneumothorax. Large amount a debris present within the distal esophagus.  IMPRESSION: CT BRAIN:  1. No acute intracranial process. 2. Generalized cerebral atrophy with chronic microvascular ischemic disease and small remote lacunar infarcts within the bilateral basal ganglia.  CT CERVICAL SPINE:  1. No acute traumatic injury within the cervical spine. 2. Large amount of debris within the partially visualized upper esophagus. This patient is likely at risk for aspiration.   Electronically Signed   By: Jeannine Boga M.D.   On: 07/02/2015 05:00   Dg Chest Port 1 View  07/08/2015    CLINICAL DATA:  Increased shortness of breath and weakness today. COPD.  EXAM: PORTABLE CHEST - 1 VIEW  COMPARISON:  07/03/2015  FINDINGS: Cardiomegaly stable. Diffuse interstitial prominence and bibasilar airspace disease shows no significant change, suspicious for diffuse pulmonary edema. Small bilateral pleural effusions cannot be excluded. No evidence of pneumothorax.  IMPRESSION: Stable cardiomegaly and symmetric interstitial and bibasilar airspace disease, suspicious for diffuse pulmonary edema/congestive heart failure. Probable small bilateral pleural effusions.   Electronically Signed   By: Earle Gell M.D.   On: 07/08/2015 19:08    ASSESSMENT AND PLAN Frail 81 year old Caucasian male was past medical history of COPD, PAD, chronic LE venous insufficiency, stage III CKD, h/o L RAS s/p L renal artery stent, and MAT controlled on diltiazem who was recently admitted from 7/14-7/16 discharged after diuresis, came back on 7/20 with increasing dyspnea, fever and cough. Also had 2 mechanical falls.  1. Acute dyspnea possibly related to HCAP and HF? - Diuresed 1.1 L and 1lb weight loss  in last 24 hours. BNP increased to 215.9. No JVD. He basically appears euvolemic except faint bibasilar rales, likely due to HCAP. On PO Lasix   2. Moderate to severe AS - Echo 07/03/2015 EF 60-65%, akinesis of basal inferior myocardium, moderate to severe AS which has progressed since the last echo, ascending aortic diameter 41 mm - outpatient f/u for possible TAVR discussion  3. MAT - Continue diltiazem, increase dose as needed.  His increased HR is likely partially due to his acute illness.    4. Mild elevated troponin - flat trend, likely due to acute illness, no evidence of ACS  5. Hypokalemia supplement given Will recheck today  Further management per Int. Med. Team    6. HL - Switched zocor to lipitor due to possible interaction with dilt. appreciates pharmacy input.   7. PAD with chronic R foot  ulcer - bilateral ace wrap  Stable from a cardiac standpoint. Will sign off. Call for questions     Keisuke Hollabaugh, Wonda Cheng, MD  07/11/2015 7:51 AM    [facility] [employer],  [place], [place]  [address] Pager [phone] Phone: [phone]; Fax: [phone]   [facility]  [address] [place], [place]  [address] [phone]   Fax [phone]

## 2015-07-12 LAB — TROPONIN I: Troponin I: 0.04 ng/mL — ABNORMAL HIGH (ref <0.031)

## 2015-07-12 LAB — BASIC METABOLIC PANEL
Anion gap: 11 (ref 5–15)
BUN: 33 mg/dL — AB (ref 6–20)
CALCIUM: 9 mg/dL (ref 8.9–10.3)
CHLORIDE: 101 mmol/L (ref 101–111)
CO2: 29 mmol/L (ref 22–32)
CREATININE: 1.66 mg/dL — AB (ref 0.61–1.24)
GFR calc Af Amer: 42 mL/min — ABNORMAL LOW (ref >60)
GFR calc non Af Amer: 36 mL/min — ABNORMAL LOW (ref >60)
Glucose, Bld: 135 mg/dL — ABNORMAL HIGH (ref 65–99)
Potassium: 3.8 mmol/L (ref 3.5–5.1)
Sodium: 141 mmol/L (ref 135–145)

## 2015-07-12 LAB — CBC WITH DIFFERENTIAL/PLATELET
BASOS ABS: 0 10*3/uL (ref 0.0–0.1)
BASOS PCT: 0 % (ref 0–1)
Eosinophils Absolute: 0.2 10*3/uL (ref 0.0–0.7)
Eosinophils Relative: 1 % (ref 0–5)
HEMATOCRIT: 39.4 % (ref 39.0–52.0)
Hemoglobin: 12.6 g/dL — ABNORMAL LOW (ref 13.0–17.0)
LYMPHS PCT: 9 % — AB (ref 12–46)
Lymphs Abs: 1.3 10*3/uL (ref 0.7–4.0)
MCH: 26.1 pg (ref 26.0–34.0)
MCHC: 32 g/dL (ref 30.0–36.0)
MCV: 81.6 fL (ref 78.0–100.0)
MONO ABS: 1.1 10*3/uL — AB (ref 0.1–1.0)
Monocytes Relative: 7 % (ref 3–12)
NEUTROS ABS: 11.9 10*3/uL — AB (ref 1.7–7.7)
NEUTROS PCT: 83 % — AB (ref 43–77)
Platelets: 225 10*3/uL (ref 150–400)
RBC: 4.83 MIL/uL (ref 4.22–5.81)
RDW: 15.6 % — ABNORMAL HIGH (ref 11.5–15.5)
WBC: 14.5 10*3/uL — AB (ref 4.0–10.5)

## 2015-07-12 LAB — GLUCOSE, CAPILLARY: GLUCOSE-CAPILLARY: 117 mg/dL — AB (ref 65–99)

## 2015-07-12 MED ORDER — FUROSEMIDE 40 MG PO TABS: 40.0000 mg | ORAL_TABLET | Freq: Two times a day (BID) | ORAL | Status: AC

## 2015-07-12 MED ORDER — DILTIAZEM HCL ER 90 MG PO CP12
200.0000 mg | ORAL_CAPSULE | Freq: Two times a day (BID) | ORAL | Status: DC
  Administered 2015-07-12 – 2015-07-13 (×3): 210 mg via ORAL
  Filled 2015-07-12 (×5): qty 2

## 2015-07-12 MED ORDER — LORAZEPAM 0.5 MG PO TABS
0.5000 mg | ORAL_TABLET | Freq: Once | ORAL | Status: AC
  Administered 2015-07-12: 0.5 mg via ORAL

## 2015-07-12 MED ORDER — CLINDAMYCIN HCL 300 MG PO CAPS: 300.0000 mg | ORAL_CAPSULE | Freq: Three times a day (TID) | ORAL | Status: DC

## 2015-07-12 NOTE — Discharge Summary (Signed)
Physician Discharge Summary  [name] RKY:[id number] DOB: 08-27-1929 DOA: 07/08/2015  PCP: [name], MD  Admit date: 07/08/2015 Discharge date: 07/12/2015  Time spent: 35 minutes  Recommendations for Outpatient Follow-up:  1. Complete by mouth clindamycin 07/18/15 2. Revert to suppressive amoxicillin on 07/19/15 3. Needs outpatient pain management organized by primary care physician 4. Recommend frequent weight checks at home and up titration of Lasix as needed 5. Recommend outpatient nephrology referral versus goals of care given multiple comorbid conditions 6. Recommend close cardiology follow-up 7.  Note that patient has been recommended skilled nursing facility placement but declines this stating that he has sick family members at home that he takes care of specifically his son 81. Patient would be considered high risk for hospital readmission given multiple comorbidities 9. Please consider CBC plus complete metabolic panel within 4-5 days 10. Please consider up titration of Cardizem as an outpatient to 200 mg twice a day versus addition of low-dose beta blocker  Discharge Diagnoses:  Principal Problem:   Sepsis due to pneumonia Active Problems:   Chronic kidney disease, stage III (moderate)   PAD (peripheral artery disease)   Multifocal atrial tachycardia   Aortic stenosis   Chronic diastolic CHF (congestive heart failure)   Discharge Condition: Fair  Diet recommendation: Regular  Filed Weights   07/10/15 0524 07/11/15 0616 07/12/15 0413  Weight: 92.8 kg (204 lb 9.4 oz) 90 kg (198 lb 6.6 oz) 88.587 kg (195 lb 4.8 oz)    History of present illness:  81 y/o ? prior heavy smoker c COPDM, POVD + Occ SFA-Calcaneal Osteo c Group B strep Bacteremia/Septic shock-completed IV Abx Nov26/2016 and now on Suppressive Amoxicillin 500 bid [foll by Dr. Tommy Medal ID, Dr. Doran [facility]], CKD III, prior MAT c PVC, Sacral decub h/o, Morbid obesity, Body mass index is 27.74 kg/(m^2)., Mo  AoS, RLS, Bipolar on meds admitted 07/08/15 with SOB CXR revealed potential PNA  Hospital Course:    Discharge Exam: Filed Vitals:   07/12/15 0413  BP: 148/91  Pulse: 60  Temp: 98 F (36.7 C)  Resp: 20     Assessment/Plan:  1 . Sepsis 2/2 HCAP -differential diagnosis infected foot ulcer [unlikely] versus pyelonephritis -much improved - Vanc, zosyn changed to PO clinda to complete ~10 days PO Aspiration coverage -Would consider him relatively immunocompromised as is on chronic suppressive amoxicillin 500 twice a day -Holding on IVF given hx of volume overload and elevated BNP .  -Cultures pending (blood) 7/20, urine 7/20 NG so far and Urine multiple organisms -Repeat lactate is trending downwards from 2.56-->1.5  -No further temperature -d/w Dr. Johnnye Sima ID briefly who recs complete HCAP course and revert to Amoxil  on 7/31 when he completed 10 days of treatment for aspiration  2 . Hypoxic resp failure, multifactorial secondary to decompensated heart failure diastolic with component Aos Severe, probable pneumonia - 2/2 HCAP + DD with Chr CHF -was on supplemental O2 Desats screen as needed-improved -I'm not sure that he is compliant on his by mouth home Lasix per last discharge summary on 7/14 by Dr. [place] Pink -He will need extensive education -Lasix now 40 po BId 7/22 per Cardiology -no JVD  3. B/L LE wounds - wound consult placed  -Wound looks clean Outpatient pain management and wound care -  4. Severe AS on TTE 07/03/15  -NO SALINE -I/O inaccurate  5. Demand ischemia - trend of troponin is flat -stop chekcing for now  6. History of MAT/PVCs -Continue Cardizem 360 daily  -  HR 140's but non sustained -tele shows continuation of this and he may require addition of low-dose beta blocker but care should be exercise given his history of COPD  -stable  7. Bipolar- -Continue lorazepam 0.5 3 times a day, Seroquel 25 daily at bedtime, sertraline 100 every  afternoon  8. Diabetes mellitus type II Continuetradjneta mg daily. A.m. blood sugar checks  9-hypothyroidism -Consider outpatient TSH -Continue Synthroid 112 g daily  Moderate non severe malnutrition -per Nutritionist  Discharge Instructions   Discharge Instructions    Diet - low sodium heart healthy    Complete by:  As directed      Discharge instructions    Complete by:  As directed   Complete 6 more days of clindamycin and then stop and revert back to Amoxil TAKE LASIX 40MG  TWICE A DAY Get Labwork done as an outpatient in about one week Go see your regular physician for urinary numerous other issues     Increase activity slowly    Complete by:  As directed           Current Discharge Medication List    START taking these medications   Details  clindamycin (CLEOCIN) 300 MG capsule Take 1 capsule (300 mg total) by mouth every 8 (eight) hours. Qty: 18 capsule, Refills: 0      CONTINUE these medications which have CHANGED   Details  furosemide (LASIX) 40 MG tablet Take 1 tablet (40 mg total) by mouth 2 (two) times daily. Qty: 30 tablet, Refills: 0      CONTINUE these medications which have NOT CHANGED   Details  acetaminophen (TYLENOL) 325 MG tablet Take 650 mg by mouth every 6 (six) hours as needed for mild pain, moderate pain, fever or headache.     amoxicillin (AMOXIL) 500 MG capsule Take 1 capsule (500 mg total) by mouth every 12 (twelve) hours. Qty: 60 capsule, Refills: 3    aspirin EC 81 MG tablet Take 81 mg by mouth every morning.     carbidopa-levodopa (SINEMET) 10-100 MG per tablet Take 1 tablet by mouth every evening.     cyclobenzaprine (FLEXERIL) 10 MG tablet Take 10 mg by mouth 2 (two) times daily. Refills: 0    diltiazem (CARDIZEM CD) 360 MG 24 hr capsule Take 1 capsule (360 mg total) by mouth daily. Qty: 30 capsule, Refills: 6    feeding supplement (ENSURE) PUDG Take 1 Container by mouth 3 (three) times daily between meals.    fentaNYL  (DURAGESIC - DOSED MCG/HR) 50 MCG/HR Place 1 patch onto the skin every 3 (three) days. Refills: 0    Fluticasone-Salmeterol (ADVAIR) 250-50 MCG/DOSE AEPB Inhale 1-2 puffs into the lungs 2 (two) times daily as needed (for wheezing.).    gabapentin (NEURONTIN) 300 MG capsule Take 1 capsule (300 mg total) by mouth 2 (two) to 3 (three)  times daily.    ipratropium (ATROVENT) 0.02 % nebulizer solution Take 500 mcg by nebulization every 6 (six) hours as needed. For shortness of breath    levothyroxine (SYNTHROID, LEVOTHROID) 112 MCG tablet Take 112 mcg by mouth every morning.     linagliptin (TRADJENTA) 5 MG TABS tablet Take 1 tablet (5 mg total) by mouth daily. Qty: 30 tablet, Refills: 2    LORazepam (ATIVAN) 0.5 MG tablet Take 0.5 mg by mouth 3 (three) times daily.     Melatonin-Pyridoxine (MELATIN PO) Take 1 tablet by mouth at bedtime.     Oxycodone HCl 10 MG TABS Take 10 mg by mouth  every 4 (four) hours as needed (pain).  Refills: 0    potassium chloride SA (K-DUR,KLOR-CON) 20 MEQ tablet Take 2 tablets (40 mEq total) by mouth once. Qty: 60 tablet, Refills: 0    QUEtiapine (SEROQUEL) 25 MG tablet Take 1 tablet (25 mg total) by mouth at bedtime. May take 1-2 during the day as needed.    sertraline (ZOLOFT) 100 MG tablet Take 100 mg by mouth every evening.     simvastatin (ZOCOR) 40 MG tablet Take 40 mg by mouth at bedtime.       STOP taking these medications     albuterol (PROVENTIL) (5 MG/ML) 0.5% nebulizer solution        No Known Allergies    The results of significant diagnostics from this hospitalization (including imaging, microbiology, ancillary and laboratory) are listed below for reference.    Significant Diagnostic Studies: Dg Chest 2 View  07/10/2015   CLINICAL DATA:  Weakness and shortness of breath and lower extremity discomfort, history of chronic CHF and aortic stenosis and COPD recently discontinued smoking.  EXAM: CHEST  2 VIEW  COMPARISON:  Portable chest  x-ray of July 08, 2015 and PA and lateral chest x-ray of July 03, 2015.  FINDINGS: The lungs are reasonably well inflated. The interstitial markings remain increased bilaterally but have improved. There is bibasilar subsegmental atelectasis or infiltrate. There may be a trace of pleural fluid layering posteriorly. The cardiac silhouette remains enlarged. The pulmonary vascularity is less engorged today. The mediastinum is normal in width. The shoulders are unremarkable.  IMPRESSION: 1. Interval improvement in the appearance of the pulmonary interstitium consistent with resolving pulmonary edema. Cardiomegaly. 2. Bibasilar atelectasis or infiltrate superimposed upon pulmonary fibrotic changes, stable.   Electronically Signed   By: David  Martinique M.D.   On: 07/10/2015 09:05   Dg Chest 2 View  07/03/2015   CLINICAL DATA:  Evaluate pulmonary edema.  EXAM: CHEST  2 VIEW  COMPARISON:  Chest radiograph 07/02/2015.  FINDINGS: Monitoring leads overlie the patient. Stable cardiomegaly. Mild interval improvement pulmonary vascular redistribution and mid and lower lung interstitial opacities. Small bilateral pleural effusions. Mid thoracic spine degenerative changes.  IMPRESSION: Cardiomegaly with slight interval improvement pulmonary edema.   Electronically Signed   By: Lovey Newcomer M.D.   On: 07/03/2015 13:02   Dg Chest 2 View  07/02/2015   CLINICAL DATA:  Shortness of breath  EXAM: CHEST  2 VIEW  COMPARISON:  10/09/2014  FINDINGS: Cardiac enlargement. Pulmonary vascular congestion. Interstitial edema in the lung bases. No blunting of costophrenic angles. No pneumothorax. Calcified and tortuous aorta with ectasia. Degenerative changes in the spine.  IMPRESSION: Cardiac enlargement with mild pulmonary vascular congestion and basilar edema.   Electronically Signed   By: Lucienne Capers M.D.   On: 07/02/2015 03:22   Dg Lumbar Spine Complete  07/02/2015   CLINICAL DATA:  Shortness of breath tonight. [employer]. Low back  pain. Pain both hips.  EXAM: LUMBAR SPINE - COMPLETE 4+ VIEW  COMPARISON:  None.  FINDINGS: Normal alignment of the lumbar spine. Degenerative changes throughout with bridging anterior osteophytes. No vertebral compression deformities. No focal bone lesion or bone destruction. Bone cortex and trabecular architecture appear intact. Vascular calcifications.  IMPRESSION: Degenerative changes in the lumbar spine. Normal alignment. No acute fractures identified.   Electronically Signed   By: Lucienne Capers M.D.   On: 07/02/2015 03:24   Dg Pelvis 1-2 Views  07/02/2015   CLINICAL DATA:  Shortness of breath tonight. Fall  tonight. Low back pain. Pain both hips.  EXAM: PELVIS - 1-2 VIEW  COMPARISON:  None.  FINDINGS: There is no evidence of pelvic fracture or diastasis. No pelvic bone lesions are seen. Degenerative changes in both hips. Surgical clips in the pelvis. Vascular calcifications.  IMPRESSION: No acute bony abnormalities.   Electronically Signed   By: Lucienne Capers M.D.   On: 07/02/2015 03:22   Ct Head Wo Contrast  07/02/2015   CLINICAL DATA:  Initial evaluation for acute trauma, fall.  EXAM: CT HEAD WITHOUT CONTRAST  CT CERVICAL SPINE WITHOUT CONTRAST  TECHNIQUE: Multidetector CT imaging of the head and cervical spine was performed following the standard protocol without intravenous contrast. Multiplanar CT image reconstructions of the cervical spine were also generated.  COMPARISON:  None.  FINDINGS: CT HEAD FINDINGS  Diffuse prominence of the CSF containing spaces is compatible with generalized cerebral atrophy. Patchy and confluent hypodensity within the periventricular and deep white matter both cerebral hemispheres present, most consistent with chronic small vessel ischemic disease. Small remote lacunar infarct present within the bilateral basal ganglia. Prominent vascular calcifications present within the carotid siphons and distal vertebral arteries.  No acute large vessel territory infarct. No  intracranial hemorrhage. No mass lesion, midline shift, or mass effect. No extra-axial fluid collection. No hydrocephalus.  Scalp soft tissues within normal limits. No acute abnormality about the orbits.  Calvarium intact.  Chronic mucosal thickening present within the right sphenoid sinus. Mild mucosal thickening within the max O sinuses bilaterally, likely chronic. Paranasal sinuses are otherwise clear. No mastoid effusion.  CT CERVICAL SPINE FINDINGS  Trace anterolisthesis of C6 on C7, likely chronic. Otherwise, vertebral bodies are normally aligned with preservation of the normal cervical lordosis. Vertebral body heights are preserved. Normal C1-2 articulations are intact. No prevertebral soft tissue swelling. No acute fracture or listhesis.  Advanced multilevel degenerative disc disease and facet arthropathy present throughout the cervical spine, most prevalent at C5-6 and C6-7.  No acute soft tissue abnormality. Prominent vascular calcifications present about the carotid bifurcations.  Visualized lung apices are clear without evidence of apical pneumothorax. Large amount a debris present within the distal esophagus.  IMPRESSION: CT BRAIN:  1. No acute intracranial process. 2. Generalized cerebral atrophy with chronic microvascular ischemic disease and small remote lacunar infarcts within the bilateral basal ganglia.  CT CERVICAL SPINE:  1. No acute traumatic injury within the cervical spine. 2. Large amount of debris within the partially visualized upper esophagus. This patient is likely at risk for aspiration.   Electronically Signed   By: Jeannine Boga M.D.   On: 07/02/2015 05:00   Ct Cervical Spine Wo Contrast  07/02/2015   CLINICAL DATA:  Initial evaluation for acute trauma, fall.  EXAM: CT HEAD WITHOUT CONTRAST  CT CERVICAL SPINE WITHOUT CONTRAST  TECHNIQUE: Multidetector CT imaging of the head and cervical spine was performed following the standard protocol without intravenous contrast.  Multiplanar CT image reconstructions of the cervical spine were also generated.  COMPARISON:  None.  FINDINGS: CT HEAD FINDINGS  Diffuse prominence of the CSF containing spaces is compatible with generalized cerebral atrophy. Patchy and confluent hypodensity within the periventricular and deep white matter both cerebral hemispheres present, most consistent with chronic small vessel ischemic disease. Small remote lacunar infarct present within the bilateral basal ganglia. Prominent vascular calcifications present within the carotid siphons and distal vertebral arteries.  No acute large vessel territory infarct. No intracranial hemorrhage. No mass lesion, midline shift, or mass effect. No extra-axial fluid  collection. No hydrocephalus.  Scalp soft tissues within normal limits. No acute abnormality about the orbits.  Calvarium intact.  Chronic mucosal thickening present within the right sphenoid sinus. Mild mucosal thickening within the max O sinuses bilaterally, likely chronic. Paranasal sinuses are otherwise clear. No mastoid effusion.  CT CERVICAL SPINE FINDINGS  Trace anterolisthesis of C6 on C7, likely chronic. Otherwise, vertebral bodies are normally aligned with preservation of the normal cervical lordosis. Vertebral body heights are preserved. Normal C1-2 articulations are intact. No prevertebral soft tissue swelling. No acute fracture or listhesis.  Advanced multilevel degenerative disc disease and facet arthropathy present throughout the cervical spine, most prevalent at C5-6 and C6-7.  No acute soft tissue abnormality. Prominent vascular calcifications present about the carotid bifurcations.  Visualized lung apices are clear without evidence of apical pneumothorax. Large amount a debris present within the distal esophagus.  IMPRESSION: CT BRAIN:  1. No acute intracranial process. 2. Generalized cerebral atrophy with chronic microvascular ischemic disease and small remote lacunar infarcts within the bilateral  basal ganglia.  CT CERVICAL SPINE:  1. No acute traumatic injury within the cervical spine. 2. Large amount of debris within the partially visualized upper esophagus. This patient is likely at risk for aspiration.   Electronically Signed   By: Jeannine Boga M.D.   On: 07/02/2015 05:00   Dg Chest Port 1 View  07/08/2015   CLINICAL DATA:  Increased shortness of breath and weakness today. COPD.  EXAM: PORTABLE CHEST - 1 VIEW  COMPARISON:  07/03/2015  FINDINGS: Cardiomegaly stable. Diffuse interstitial prominence and bibasilar airspace disease shows no significant change, suspicious for diffuse pulmonary edema. Small bilateral pleural effusions cannot be excluded. No evidence of pneumothorax.  IMPRESSION: Stable cardiomegaly and symmetric interstitial and bibasilar airspace disease, suspicious for diffuse pulmonary edema/congestive heart failure. Probable small bilateral pleural effusions.   Electronically Signed   By: Earle Gell M.D.   On: 07/08/2015 19:08    Microbiology: Recent Results (from the past 240 hour(s))  Blood Culture (routine x 2)     Status: None (Preliminary result)   Collection Time: 07/08/15  6:21 PM  Result Value Ref Range Status   Specimen Description BLOOD BLOOD RIGHT FOREARM  Final   Special Requests BOTTLES DRAWN AEROBIC AND ANAEROBIC 5CC  Final   Culture   Final    NO GROWTH 3 DAYS Performed at [facility]    Report Status PENDING  Incomplete  Urine culture     Status: None   Collection Time: 07/08/15  6:21 PM  Result Value Ref Range Status   Specimen Description URINE, CLEAN CATCH  Final   Special Requests NONE  Final   Culture   Final    MULTIPLE SPECIES PRESENT, SUGGEST RECOLLECTION Performed at [facility]    Report Status 07/10/2015 FINAL  Final  Blood Culture (routine x 2)     Status: None (Preliminary result)   Collection Time: 07/08/15  6:39 PM  Result Value Ref Range Status   Specimen Description BLOOD BLOOD LEFT HAND  Final    Special Requests BOTTLES DRAWN AEROBIC AND ANAEROBIC 3CC  Final   Culture   Final    NO GROWTH 3 DAYS Performed at [facility]    Report Status PENDING  Incomplete     Labs: Basic Metabolic Panel:  Recent Labs Lab 07/08/15 1835 07/08/15 2230 07/09/15 0425 07/10/15 0423 07/11/15 1004 07/12/15 0505  NA 134*  --  137 135 138 141  K 4.2  --  3.9 2.8* 3.4* 3.8  CL 95*  --  98* 93* 97* 101  CO2 26  --  30 30 29 29   GLUCOSE 188*  --  149* 147* 159* 135*  BUN 32*  --  33* 31* 30* 33*  CREATININE 1.97* 1.93* 1.96* 1.74* 1.68* 1.66*  CALCIUM 8.6*  --  8.5* 8.1* 8.6* 9.0   Liver Function Tests:  Recent Labs Lab 07/08/15 1835 07/10/15 0423  AST 20 16  ALT 16* 12*  ALKPHOS 75 71  BILITOT 0.8 1.1  PROT 7.1 6.8  ALBUMIN 3.2* 3.0*   No results for input(s): LIPASE, AMYLASE in the last 168 hours. No results for input(s): AMMONIA in the last 168 hours. CBC:  Recent Labs Lab 07/08/15 1835 07/08/15 2230 07/09/15 0425 07/10/15 0423 07/11/15 1605 07/12/15 0505  WBC 17.6* 20.4* 16.7* 15.3* 12.8* 14.5*  NEUTROABS 16.3*  --   --  13.2* 10.6* 11.9*  HGB 13.4 12.6* 12.5* 12.1* 12.8* 12.6*  HCT 41.6 39.3 39.2 37.0* 39.4 39.4  MCV 82.7 83.1 82.0 81.1 80.9 81.6  PLT 173 160 148* 185 216 225   Cardiac Enzymes:  Recent Labs Lab 07/11/15 0357 07/11/15 1004 07/11/15 1605 07/11/15 2132 07/12/15 0505  TROPONINI 0.04* 0.03 0.03 0.04* 0.04*   BNP: BNP (last 3 results)  Recent Labs  07/02/15 0250 07/08/15 1838 07/09/15 0425  BNP 181.6* 148.9* 215.9*    ProBNP (last 3 results)  Recent Labs  10/05/14 1555  PROBNP 5498.0*    CBG: No results for input(s): GLUCAP in the last 168 hours.     SignedNita Sells  [facility] 07/12/2015, 9:54 AM

## 2015-07-12 NOTE — Progress Notes (Signed)
Pt evaluated by the Education officer, museum and has agreed to go to a SNF. Awaiting further instructions.

## 2015-07-12 NOTE — Clinical Social Work Note (Signed)
Clinical Social Work Assessment  Patient Details  Name: [name] MRN: [id number] Date of Birth: 12/13/1929  Date of referral:  07/12/15               Reason for consult:  Facility Placement                Permission sought to share information with:  Facility Sport and exercise psychologist, Family Supports Permission granted to share information::  Yes, Verbal Permission Granted  Name::        Agency::     Relationship::     Contact Information:     Housing/Transportation Living arrangements for the past 2 months:  Single Family Home Source of Information:  Patient, Spouse Patient Interpreter Needed:    Criminal Activity/Legal Involvement Pertinent to Current Situation/Hospitalization:    Significant Relationships:  Adult Children, Spouse Lives with:  Adult Children, Spouse Do you feel safe going back to the place where you live?    Need for family participation in patient care:  Yes (Comment)  Care giving concerns:  Pts wife is concerned if pt comes home instead of rehab she will not be able to care for him   Social Worker assessment / plan:  CSW met with pt at bedside to discuss SNF.  CSW also called and spoke with pts wife to discuss SNF.  CSW explained that MD had asked her to meet with him regarding rehab.  CSW provided SNF bed options and prompted pt to discuss any concerns he had.  CSW encouraged pt to explore his feelings with regards to rehab.  CSW will fax pt information out to SNF's.  Employment status:  Retired Forensic scientist:  Managed Care PT Recommendations:  [address] [place] / Referral to community resources:     Patient/Family's Response to care:  Pt extremely resistant to rehab.  Pt stated that he wants to go home.  Pt discussed his home life and continued to stated that he wanted to go home and then go to rehab.  Pt finally agreed to rehab stating "I feel like I ought to try it".  Patient/Family's Understanding of and Emotional Response to  Diagnosis, Current Treatment, and Prognosis:  Pt appears to understand his diagnoses.  Pt appears to have some grief with regards to needing to go to SNF  Emotional Assessment Appearance:  Appears stated age Attitude/Demeanor/Rapport:  Apprehensive Affect (typically observed):  Apprehensive, Anxious Orientation:  Oriented to Self, Oriented to Place, Oriented to Situation Alcohol / Substance use:    Psych involvement (Current and /or in the community):     Discharge Needs  Concerns to be addressed:    Readmission within the last 30 days:  Yes Current discharge risk:    Barriers to Discharge:      Caswell Corwin 07/12/2015, 3:47 PM

## 2015-07-12 NOTE — Progress Notes (Signed)
Received call from patient's wife about his care when discharge home. Pt wife states she is not able to provide care for patient and would like him to be placed in a skill nursing facility. Pt wife requests to speak with MD and Education officer, museum. MS [name] was given reassurance that i will inform his doctor and ask for consult.

## 2015-07-13 LAB — CULTURE, BLOOD (ROUTINE X 2)
CULTURE: NO GROWTH
Culture: NO GROWTH

## 2015-07-13 MED ORDER — CYCLOBENZAPRINE HCL 5 MG PO TABS
5.0000 mg | ORAL_TABLET | Freq: Three times a day (TID) | ORAL | Status: DC | PRN
  Administered 2015-07-13: 5 mg via ORAL
  Filled 2015-07-13: qty 1

## 2015-07-13 NOTE — Clinical Social Work Placement (Signed)
Patient is set to discharge to [facility] today. Patient & wife, [name] aware. Discharge packet given to RN, [facility]. [facility] called for transport to pickup at 1:00pm.     Raynaldo Opitz, [place] Social Worker cell #: [phone]    CLINICAL SOCIAL WORK PLACEMENT  NOTE  Date:  07/13/2015  Patient Details  Name: [name] MRN: [id number] Date of Birth: 06/30/1929  Clinical Social Work is seeking post-discharge placement for this patient at the [place] level of care (*CSW will initial, date and re-position this form in  chart as items are completed):  Yes   Patient/family provided with [place] Work Department's list of facilities offering this level of care within the geographic area requested by the patient (or if unable, by the patient's family).  Yes   Patient/family informed of their freedom to choose among providers that offer the needed level of care, that participate in Medicare, Medicaid or managed care program needed by the patient, have an available bed and are willing to accept the patient.  Yes   Patient/family informed of 's ownership interest in [facility] and [facility], as well as of the fact that they are under no obligation to receive care at these facilities.  PASRR submitted to EDS on       PASRR number received on       Existing PASRR number confirmed on 07/12/15     FL2 transmitted to all facilities in geographic area requested by pt/family on 07/12/15     FL2 transmitted to all facilities within larger geographic area on       Patient informed that his/her managed care company has contracts with or will negotiate with certain facilities, including the following:        Yes   Patient/family informed of bed offers received.  Patient chooses bed at [facility]     Physician recommends and patient chooses bed at      Patient to be transferred  to [facility] on 07/13/15.  Patient to be transferred to facility by [facility]     Patient family notified on 07/13/15 of transfer.  Name of family member notified:  patient's wife, [name] via phone     PHYSICIAN       Additional Comment:    _______________________________________________ Standley Brooking, LCSW 07/13/2015, 12:34 PM

## 2015-07-13 NOTE — Progress Notes (Signed)
07/13/15  1240  Called report to [place] at [facility].

## 2015-07-13 NOTE — Progress Notes (Signed)
Physical Therapy Treatment Patient Details Name: [name] MRN: [id number] DOB: 10-07-1929 Today's Date: 07/13/2015    History of Present Illness 81 year old male was past medical history of COPD, PAD, chronic LE venous insufficiency, stage III CKD, h/o L RAS s/p L renal artery stent, and MAT controlled on diltiazem who was recently admitted from 7/14-7/16 discharged after diuresis, readmitted on 7/20 with increasing dyspnea, fever and cough as well as had 2 mechanical falls.    PT Comments    Pt assisted out of bed however only able to tolerate short distance of ambulation due to fatigue and SOB, with HR 146 bpm upon return to bed.  Per chart review, spouse unable to assist pt at home and plan is for SNF at this time, possibly today.   Follow Up Recommendations  Supervision/Assistance - 24 hour;SNF     Equipment Recommendations  None recommended by PT    Recommendations for Other Services       Precautions / Restrictions Precautions Precautions: Fall Precaution Comments: monitor vitals    Mobility  Bed Mobility Overal bed mobility: Needs Assistance Bed Mobility: Supine to Sit;Sit to Supine     Supine to sit: Supervision;HOB elevated Sit to supine: Supervision      Transfers Overall transfer level: Needs assistance Equipment used: Rolling walker (2 wheeled) Transfers: Sit to/from Stand Sit to Stand: Min guard         General transfer comment: min/guard for safety, verbal cues for hand placement  Ambulation/Gait Ambulation/Gait assistance: Min guard Ambulation Distance (Feet): 22 Feet Assistive device: Rolling walker (2 wheeled) Gait Pattern/deviations: Step-through pattern;Trunk flexed     General Gait Details: verbal cues for safe use of RW, distance limited by fatigue and increased SOB upon return to bed with HR 146 (on telemetry) and SPO2 90-93% room air (RN in room and aware of HR)   Stairs            Wheelchair Mobility    Modified  Rankin (Stroke Patients Only)       Balance                                    Cognition Arousal/Alertness: Awake/alert Behavior During Therapy: WFL for tasks assessed/performed Overall Cognitive Status: Within Functional Limits for tasks assessed                      Exercises      General Comments        Pertinent Vitals/Pain Pain Assessment: No/denies pain    Home Living                      Prior Function            PT Goals (current goals can now be found in the care plan section) Progress towards PT goals: Progressing toward goals    Frequency  Min 3X/week    PT Plan Current plan remains appropriate    Co-evaluation             End of Session Equipment Utilized During Treatment: Gait belt Activity Tolerance: Patient limited by fatigue Patient left: with call bell/phone within reach;in bed;with bed alarm set;with nursing/sitter in room     Time: 0917-0930 PT Time Calculation (min) (ACUTE ONLY): 13 min  Charges:  $Gait Training: 8-22 mins  G CodesTrena Platt Jul 25, 2015, 10:48 AM Carmelia Bake, PT, DPT 2015-07-25 Pager: [phone]

## 2015-07-13 NOTE — Progress Notes (Signed)
No changes Back pain feels better Can d/c to SNF today   Verneita Griffes, MD [facility] [phone]

## 2015-07-13 NOTE — Care Management Important Message (Signed)
Important Message  Patient Details  Name: [name] MRN: [id number] Date of Birth: [birth year]   Medicare Important Message Given:  Yes-third notification given    Camillo Flaming 07/13/2015, 1:13 [place] Message  Patient Details  Name: [name] MRN: [id number] Date of Birth: [birth year]   Medicare Important Message Given:  Yes-third notification given    Camillo Flaming 07/13/2015, 1:12 PM

## 2015-07-14 ENCOUNTER — Non-Acute Institutional Stay (SKILLED_NURSING_FACILITY): Payer: Medicare Other | Admitting: Internal Medicine

## 2015-07-14 ENCOUNTER — Encounter: Payer: Self-pay | Admitting: Internal Medicine

## 2015-07-14 DIAGNOSIS — S81801D Unspecified open wound, right lower leg, subsequent encounter: Secondary | ICD-10-CM | POA: Diagnosis not present

## 2015-07-14 DIAGNOSIS — E038 Other specified hypothyroidism: Secondary | ICD-10-CM

## 2015-07-14 DIAGNOSIS — A419 Sepsis, unspecified organism: Secondary | ICD-10-CM | POA: Diagnosis not present

## 2015-07-14 DIAGNOSIS — M86671 Other chronic osteomyelitis, right ankle and foot: Secondary | ICD-10-CM

## 2015-07-14 DIAGNOSIS — J9601 Acute respiratory failure with hypoxia: Secondary | ICD-10-CM

## 2015-07-14 DIAGNOSIS — E1159 Type 2 diabetes mellitus with other circulatory complications: Secondary | ICD-10-CM | POA: Diagnosis not present

## 2015-07-14 DIAGNOSIS — I350 Nonrheumatic aortic (valve) stenosis: Secondary | ICD-10-CM | POA: Diagnosis not present

## 2015-07-14 DIAGNOSIS — F3130 Bipolar disorder, current episode depressed, mild or moderate severity, unspecified: Secondary | ICD-10-CM

## 2015-07-14 DIAGNOSIS — E034 Atrophy of thyroid (acquired): Secondary | ICD-10-CM | POA: Diagnosis not present

## 2015-07-14 DIAGNOSIS — I471 Supraventricular tachycardia: Secondary | ICD-10-CM

## 2015-07-14 DIAGNOSIS — F319 Bipolar disorder, unspecified: Secondary | ICD-10-CM | POA: Insufficient documentation

## 2015-07-14 NOTE — Assessment & Plan Note (Signed)
Not stated ad uncontrolled ;SNF plan - tradjenta daily and check am BS

## 2015-07-14 NOTE — Assessment & Plan Note (Signed)
Dry caked skin with some cracking R heel;SNF plan - wound care with una boot for swelling also and QOD warm soaks

## 2015-07-14 NOTE — Assessment & Plan Note (Signed)
Continue lorazepam 0.5 3 times a day, Seroquel 25 daily at bedtime, sertraline 100 every afternoon

## 2015-07-14 NOTE — Assessment & Plan Note (Signed)
2/2 HCAP -differential diagnosis infected foot ulcer [unlikely] versus pyelonephritis -much improved - Vanc, zosyn changed to PO clinda to complete ~10 days PO Aspiration coverage -Would consider him relatively immunocompromised as is on chronic suppressive amoxicillin 500 twice a day -Holding on IVF given hx of volume overload and elevated BNP .  -Cultures pending (blood) 7/20, urine 7/20 NG so far and Urine multiple organisms -Repeat lactate is trending downwards from 2.56-->1.5  SNF plan - clindamycin until 7/30 to complete abx tx;plan CBC 8/1

## 2015-07-14 NOTE — Assessment & Plan Note (Signed)
HR 140's but non sustained; SNF plan - cont cardizem 360 mg;consider add metoprolol if needed and if COPD can tolerate

## 2015-07-14 NOTE — Assessment & Plan Note (Signed)
on TTE 07/03/15 SNF plan - rec pt can not tolerate NS and needs to avoid salt

## 2015-07-14 NOTE — Assessment & Plan Note (Signed)
SNF plan -amoxicillen 500 BID for chronic suppresion -to start 7/31 after clinda finishes

## 2015-07-14 NOTE — Assessment & Plan Note (Signed)
multifactorial secondary to decompensated heart failure diastolic with component Aos Severe, probable pneumonia - 2/2 HCAP + DD with Chr CHF -was on supplemental O2 Desats screen as needed-improved -I'm not sure that he is compliant on his by mouth home Lasix per last discharge summary on 7/14 by Dr. [place] Pink SNF plan - lasic 40 mg daily and follow BMP closely

## 2015-07-14 NOTE — Assessment & Plan Note (Signed)
SNF plan - cont synthroid 112 mcg daily

## 2015-07-14 NOTE — Progress Notes (Signed)
MRN: [id number] Name: [name]  Sex: male Age: 81 y.o. DOB: [birth year]  [place] [facility]: [name] Facility/Room:103 Level Of Care: SNF Provider: Inocencio Homes D Emergency Contacts: Extended Emergency Contact Information Primary Emergency Contact: [name],[name] Address: [address] [place]          [place] Phone: [phone] Mobile Phone: [phone] Relation: Spouse Secondary Emergency Contact: [name],[name]  [place] Mobile Phone: [phone] Relation: Son  Code Status:   Allergies: Review of patient's allergies indicates no known allergies.  Chief Complaint  Patient presents with  . New Admit To SNF    HPI: Patient is 81 y.o. male who is a  prior heavy smoker c COPD, PVD + Occ SFA-Calcaneal Osteo c Group B strep Bacteremia/Septic shock-completed IV Abx Nov26/2016 and now on Suppressive Amoxicillin 500 bid [foll by Dr. Tommy Medal ID, Dr. [facility]], CKD III, prior MAT c PVC, Sacral decub h/o, Morbid obesity, Body mass index is 27.74 kg/(m^2)., Mo AoS, RLS, Bipolar on meds admitted 7/20-24 for sepsis 2/2 PNA, HCAP, vs aspiration and acute resp failure with hypoxia 2/2 to CHF, severe AS and PNA. Pt is d/c from hospital and admitted to SNF for generalized weakness from hospitalization and illness. Pt will be followed also for bipolar dx, tx with zoloft,lorazepam and seroquel, DM2 tx with tradgenta and BS checks daily and chronic osteomylitis tx with amoxil 500 mg BID.  Past Medical History  Diagnosis Date  . Hyperlipidemia   . COPD (chronic obstructive pulmonary disease)   . Leg pain   . GERD (gastroesophageal reflux disease)   . Depression   . Dizziness   . Productive cough   . Thyroid disease   . Peripheral vascular disease   . ED (erectile dysfunction)   . Hypertension     08/17/12 - wife denies  . Walking pneumonia 2000's  . H/O hiatal hernia   . Migraine     "when I was a young boy"  . Arthritis     "hands"  (08/28/2014)  . Anxiety   . Hypothyroidism   . Chronic kidney disease (CKD), stage III (moderate)     [name] 08/28/2014    Past Surgical History  Procedure Laterality Date  . Renal artery stent Left   . Kidney stone surgery  1989  . Lower extremity angiogram Left 03-20-2014    Dr. Tinnie Gens  . Tonsillectomy    . Cataract extraction, bilateral Bilateral   . Blepharoplasty Right   . Abdominal aortagram N/A 03/20/2013    Procedure: ABDOMINAL Maxcine Ham;  Surgeon: [name], MD;  Location: [facility] CATH LAB;  Service: Cardiovascular;  Laterality: N/A;  . Lower extremity angiogram Left 03/20/2013    Procedure: LOWER EXTREMITY ANGIOGRAM;  Surgeon: [name], MD;  Location: [facility] CATH LAB;  Service: Cardiovascular;  Laterality: Left;      Medication List       This list is accurate as of: 07/14/15 11:59 PM.  Always use your most recent med list.               acetaminophen 325 MG tablet  Commonly known as:  TYLENOL  Take 650 mg by mouth every 6 (six) hours as needed for mild pain, moderate pain, fever or headache.     amoxicillin 500 MG capsule  Commonly known as:  AMOXIL  Take 1 capsule (500 mg total) by mouth every 12 (twelve) hours.     aspirin EC 81 MG tablet  Take  81 mg by mouth every morning.     carbidopa-levodopa 10-100 MG per tablet  Commonly known as:  SINEMET IR  Take 1 tablet by mouth every evening.     clindamycin 300 MG capsule  Commonly known as:  CLEOCIN  Take 1 capsule (300 mg total) by mouth every 8 (eight) hours.     cyclobenzaprine 10 MG tablet  Commonly known as:  FLEXERIL  Take 10 mg by mouth 2 (two) times daily.     diltiazem 360 MG 24 hr capsule  Commonly known as:  CARDIZEM CD  Take 1 capsule (360 mg total) by mouth daily.     feeding supplement (ENSURE) Pudg  Take 1 Container by mouth 3 (three) times daily between meals.     fentaNYL 50 MCG/HR  Commonly known as:  DURAGESIC - dosed mcg/hr  Place 1 patch onto the skin every 3 (three) days.      Fluticasone-Salmeterol 250-50 MCG/DOSE Aepb  Commonly known as:  ADVAIR  Inhale 1-2 puffs into the lungs 2 (two) times daily as needed (for wheezing.).     furosemide 40 MG tablet  Commonly known as:  LASIX  Take 1 tablet (40 mg total) by mouth 2 (two) times daily.     gabapentin 300 MG capsule  Commonly known as:  NEURONTIN  Take 1 capsule (300 mg total) by mouth 2 (two) to 3 (three)  times daily.     ipratropium 0.02 % nebulizer solution  Commonly known as:  ATROVENT  Take 500 mcg by nebulization every 6 (six) hours as needed. For shortness of breath     levothyroxine 112 MCG tablet  Commonly known as:  SYNTHROID, LEVOTHROID  Take 112 mcg by mouth every morning.     linagliptin 5 MG Tabs tablet  Commonly known as:  TRADJENTA  Take 1 tablet (5 mg total) by mouth daily.     LORazepam 0.5 MG tablet  Commonly known as:  ATIVAN  Take 0.5 mg by mouth 3 (three) times daily.     MELATIN PO  Take 1 tablet by mouth at bedtime.     Oxycodone HCl 10 MG Tabs  Take 10 mg by mouth every 4 (four) hours as needed (pain).     potassium chloride SA 20 MEQ tablet  Commonly known as:  K-DUR,KLOR-CON  Take 2 tablets (40 mEq total) by mouth once.     QUEtiapine 25 MG tablet  Commonly known as:  SEROQUEL  Take 1 tablet (25 mg total) by mouth at bedtime. May take 1-2 during the day as needed.     sertraline 100 MG tablet  Commonly known as:  ZOLOFT  Take 100 mg by mouth every evening.     simvastatin 40 MG tablet  Commonly known as:  ZOCOR  Take 40 mg by mouth at bedtime.        No orders of the defined types were placed in this encounter.    Immunization History  Administered Date(s) Administered  . Influenza,inj,Quad PF,36+ Mos 08/30/2014    History  Substance Use Topics  . Smoking status: Former Smoker -- 1.00 packs/day for 25 years    Types: Cigarettes    Quit date: 08/17/2012  . Smokeless tobacco: Never Used  . Alcohol Use: 0.0 oz/week    0 Standard drinks or  equivalent per week     Comment: 08/28/2014 "last drink was in ~ 2012"    Family history is  + HD  Review of Systems  DATA OBTAINED:  from patient, wife GENERAL:  no fevers, +fatigue, appetite changes SKIN: No itching, rash;chronic wound on R foot EYES: No eye pain, redness, discharge EARS: No earache, tinnitus, change in hearing NOSE: No congestion, drainage or bleeding  MOUTH/THROAT: No mouth or tooth pain, No sore throat RESPIRATORY: No cough, wheezing, SOB CARDIAC: No chest pain, palpitations, lower extremity edema  GI: No abdominal pain, No N/V/D or constipation, No heartburn or reflux  GU: No dysuria, frequency or urgency, or incontinence  MUSCULOSKELETAL: + unrelieved bone/joint pain NEUROLOGIC: No headache, dizziness  PSYCHIATRIC: No c/o anxiety or sadness   Filed Vitals:   07/14/15 2232  BP: 141/71  Pulse: 67  Temp: 98 F (36.7 C)  Resp: 18    SpO2 Readings from Last 1 Encounters:  07/13/15 97%        Physical Exam  GENERAL APPEARANCE: Alert,min  conversant,  No acute distress.  SKIN: No diaphoresis rash; B ankles feet with dry crusty hyperkyratotic skin with deep ulcer R plantar without infection HEAD: Normocephalic, atraumatic  EYES: Conjunctiva/lids clear. Pupils round, reactive. EOMs intact.  EARS: External exam WNL, canals clear. Hearing grossly normal.  NOSE: No deformity or discharge.  MOUTH/THROAT: Lips w/o lesions  RESPIRATORY: Breathing is even, unlabored. Lung sounds are clear   CARDIOVASCULAR: Heart RRR 3/6 M,  trace peripheral edema.   GASTROINTESTINAL: Abdomen is soft, non-tender, not distended w/ normal bowel sounds. GENITOURINARY: Bladder non tender, not distended  MUSCULOSKELETAL: No abnormal joints or musculature NEUROLOGIC:  Cranial nerves 2-12 grossly intact. PSYCHIATRIC: exhibits seeking behavoir  Patient Active Problem List   Diagnosis Date Noted  . Bipolar disorder 07/14/2015  . Aortic stenosis 07/09/2015  . Chronic diastolic  CHF (congestive heart failure) 07/09/2015  . Sepsis due to pneumonia 07/08/2015  . Acute respiratory failure with hypoxia 07/02/2015  . Pulmonary edema 07/02/2015  . Leukocytosis 07/02/2015  . DM2 (diabetes mellitus, type 2) 07/02/2015  . Chronic osteomyelitis of right foot 11/11/2014  . Beta-hemolytic group B streptococcal sepsis 11/11/2014  . Bacteremia 11/11/2014  . Atherosclerotic PVD with ulceration 10/28/2014  . Hyperlipidemia 10/07/2014  . Obesity 10/07/2014  . Moderate aortic stenosis 10/07/2014  . Multifocal atrial tachycardia 10/06/2014  . Sepsis 10/05/2014  . Wound of right leg 08/28/2014  . Foot ulcer 01/04/2013  . Cellulitis 01/04/2013  . PAD (peripheral artery disease) 01/04/2013  . Leg swelling 10/23/2012  . Atherosclerosis of native arteries of the extremities with ulceration 10/23/2012  . Aspiration pneumonia 08/17/2012  . Toxic encephalopathy 08/17/2012  . COPD with acute exacerbation 08/17/2012  . Sacral decubitus ulcer 08/17/2012  . Chronic pain 08/17/2012  . Anxiety disorder 08/17/2012  . Chronic kidney disease, stage III (moderate) 08/17/2012  . Hypothyroidism 02/21/2008  . DYSLIPIDEMIA 02/21/2008  . ANXIETY DEPRESSION 02/21/2008  . RHEUMATIC FEVER 02/21/2008  . HYPERTENSION 02/21/2008  . COPD 02/21/2008  . ESOPHAGEAL MOTILITY DISORDER 02/21/2008  . ESOPHAGEAL DIVERTICULUM 02/21/2008  . SLEEP APNEA 02/21/2008  . ATHEROSCLEROSIS 07/11/2007  . INGUINAL HERNIAS, BILATERAL 07/11/2007  . HERNIA, UMBILICAL 12/11/8249  . CHOLELITHIASIS 07/11/2007  . UNSPECIFIED RENAL SCLEROSIS 07/11/2007  . TUBULOVILLOUS ADENOMA, COLON 11/02/2005  . GERD 11/02/2005  . DIVERTICULOSIS OF COLON 11/02/2005  . MELANOSIS COLI 11/02/2005    CBC    Component Value Date/Time   WBC 14.5* 07/12/2015 0505   RBC 4.83 07/12/2015 0505   HGB 12.6* 07/12/2015 0505   HCT 39.4 07/12/2015 0505   PLT 225 07/12/2015 0505   MCV 81.6 07/12/2015 0505   LYMPHSABS 1.3 07/12/2015 0505  MONOABS 1.1* 07/12/2015 0505   EOSABS 0.2 07/12/2015 0505   BASOSABS 0.0 07/12/2015 0505    CMP     Component Value Date/Time   NA 141 07/12/2015 0505   K 3.8 07/12/2015 0505   CL 101 07/12/2015 0505   CO2 29 07/12/2015 0505   GLUCOSE 135* 07/12/2015 0505   BUN 33* 07/12/2015 0505   CREATININE 1.66* 07/12/2015 0505   CALCIUM 9.0 07/12/2015 0505   PROT 6.8 07/10/2015 0423   ALBUMIN 3.0* 07/10/2015 0423   AST 16 07/10/2015 0423   ALT 12* 07/10/2015 0423   ALKPHOS 71 07/10/2015 0423   BILITOT 1.1 07/10/2015 0423   GFRNONAA 36* 07/12/2015 0505   GFRAA 42* 07/12/2015 0505    Lab Results  Component Value Date   HGBA1C 6.4* 10/07/2014     Dg Chest Port 1 View  07/08/2015   CLINICAL DATA:  Increased shortness of breath and weakness today. COPD.  EXAM: PORTABLE CHEST - 1 VIEW  COMPARISON:  07/03/2015  FINDINGS: Cardiomegaly stable. Diffuse interstitial prominence and bibasilar airspace disease shows no significant change, suspicious for diffuse pulmonary edema. Small bilateral pleural effusions cannot be excluded. No evidence of pneumothorax.  IMPRESSION: Stable cardiomegaly and symmetric interstitial and bibasilar airspace disease, suspicious for diffuse pulmonary edema/congestive heart failure. Probable small bilateral pleural effusions.   Electronically Signed   By: Earle Gell M.D.   On: 07/08/2015 19:08    Not all labs, radiology exams or other studies done during hospitalization come through on my EPIC note; however they are reviewed by me.    Assessment and Plan  Sepsis 2/2 HCAP -differential diagnosis infected foot ulcer [unlikely] versus pyelonephritis -much improved - Vanc, zosyn changed to PO clinda to complete ~10 days PO Aspiration coverage -Would consider him relatively immunocompromised as is on chronic suppressive amoxicillin 500 twice a day -Holding on IVF given hx of volume overload and elevated BNP .  -Cultures pending (blood) 7/20, urine 7/20 NG so far and  Urine multiple organisms -Repeat lactate is trending downwards from 2.56-->1.5  SNF plan - clindamycin until 7/30 to complete abx tx;plan CBC 8/1  Acute respiratory failure with hypoxia multifactorial secondary to decompensated heart failure diastolic with component Aos Severe, probable pneumonia - 2/2 HCAP + DD with Chr CHF -was on supplemental O2 Desats screen as needed-improved -I'm not sure that he is compliant on his by mouth home Lasix per last discharge summary on 7/14 by Dr. [place] Pink SNF plan - lasic 40 mg daily and follow BMP closely  Multifocal atrial tachycardia HR 140's but non sustained; SNF plan - cont cardizem 360 mg;consider add metoprolol if needed and if COPD can tolerate  Aortic stenosis on TTE 07/03/15 SNF plan - rec pt can not tolerate NS and needs to avoid salt  Bipolar disorder Continue lorazepam 0.5 3 times a day, Seroquel 25 daily at bedtime, sertraline 100 every afternoon  Chronic osteomyelitis of right foot SNF plan -amoxicillen 500 BID for chronic suppresion -to start 7/31 after clinda finishes  DM2 (diabetes mellitus, type 2) Not stated ad uncontrolled ;SNF plan - tradjenta daily and check am BS  Hypothyroidism SNF plan - cont synthroid 112 mcg daily  Wound of right leg Dry caked skin with some cracking R heel;SNF plan - wound care with una boot for swelling also and QOD warm soaks    Hennie Duos, MD

## 2015-07-15 NOTE — Addendum Note (Signed)
Addended by: Inocencio Homes D on: 07/15/2015 04:55 PM   Modules accepted: Level of Service

## 2015-08-18 ENCOUNTER — Non-Acute Institutional Stay (SKILLED_NURSING_FACILITY): Payer: Medicare Other | Admitting: Internal Medicine

## 2015-08-18 DIAGNOSIS — L97529 Non-pressure chronic ulcer of other part of left foot with unspecified severity: Secondary | ICD-10-CM

## 2015-08-18 DIAGNOSIS — E0851 Diabetes mellitus due to underlying condition with diabetic peripheral angiopathy without gangrene: Secondary | ICD-10-CM

## 2015-08-18 DIAGNOSIS — E11621 Type 2 diabetes mellitus with foot ulcer: Secondary | ICD-10-CM

## 2015-08-19 ENCOUNTER — Other Ambulatory Visit: Payer: Self-pay | Admitting: Internal Medicine

## 2015-08-19 DIAGNOSIS — M86172 Other acute osteomyelitis, left ankle and foot: Secondary | ICD-10-CM

## 2015-08-19 LAB — ABI

## 2015-08-19 NOTE — Progress Notes (Addendum)
Patient ID: [name], male   DOB: [birth year], 81 y.o.   MRN: [id number]               PROGRESS NOTE  DATE:  08/18/2015        FACILITY: [name]              LEVEL OF CARE:   SNF   Acute Visit               CHIEF COMPLAINT:  Review of wound over the lateral aspect of his left fifth metatarsal head.    HISTORY OF PRESENT ILLNESS:   This is a patient who was admitted to hospital from 07/08/2015 through 07/12/2015 with healthcare-acquired pneumonia and sepsis.  No organism was obtained.  Blood cultures were negative.  He received vancomycin and Zosyn in the hospital and then he was changed to clindamycin to complete a 10-day course which he did earlier this month.  He is also on suppressive chronic amoxicillin 500 b.i.d. for a history of group B sepsis.    In the hospital, he was noted to have bilateral lower extremity wounds.  I think these were mostly on his legs.  Since he came to the facility, I think they have been applying Kerlix and Coban wraps.    He also has a chronic wound on the plantar heel on the right and this is being followed by the wound care team in the facility.    Over the course of August, he was on two weeks of ciprofloxacin, I believe for a UTI.  He generally appears to have remained fairly stable in the facility.    He had a CBC on 07/16/2015 in the facility with a white count of 13.5.  Otherwise, he appears to have no evidence of ongoing infection.    Per the wound care nurse in the facility, he had a callus over the lateral aspect of his left fifth metatarsal head.  Perhaps there was some drainage here.  After questioning her, I am really not certain.  In any case, she wrapped his leg with Kerlix and Coban as usual on Friday, removed it on Monday and there was a gaping open wound exposed to bone.  There is no history of overt trauma that I am aware of, although the patient had a fall earlier in the admission.  I was called about this late yesterday.  I  ordered a culture from the wound and ordered silver alginate based dressings.  I came down to see this today.    PAST MEDICAL HISTORY/PROBLEM LIST:           Hyperlipidemia.    COPD.    Gastroesophageal reflux disease.     Depression.    Dizziness.    Peripheral vascular disease.    Hypertension.    Migraines.    Arthritis.    Hypothyroidism.    Moderate stage III chronic renal failure.     PAST SURGICAL HISTORY:            Renal artery stent.     Renal kidney stone surgery in 1989.    Lower extremity angiogram in 2015.     Abdominal aortogram in 2014.    Lower extremity aortogram in 2014.     CURRENT MEDICATIONS:  Medication list is reviewed.            Lasix 40 mg daily.     Oxycodone 10 mg q.4 p.r.n.      Tylenol  650 q.6 p.r.n.     Seroquel 25 mg tablets, 1-2 tablets during the day as needed for bipolar disorder.    Amoxil 500 q.12, started on 07/19/2015.    Cyclobenzaprine 10 mg b.i.d. for muscle spasms.    Advair Diskus 250/50, 1 puff twice daily.    Fentanyl patch 50 mcg per hour.    ASA 81 q.d.       Tradjenta 5 mg daily.     KCl 20 mEq daily.    Diltiazem CD 360 daily.      Sinemet 10/100, 1 tablet every evening.    Melatonin 1 tablet at bedtime.    Seroquel 125 mg q.h.s. for bipolar.    Simvastatin 10 q.d.      Neurontin 300 three times a day.    Synthroid 112, 1 tablet daily.    *Received Cipro from 07/24/2015 through 08/06/2015.      REVIEW OF SYSTEMS:    GENERAL:  The patient has not been febrile or systemically unwell.   CHEST/RESPIRATORY:  No cough.  No sputum.    CARDIAC:  No chest pain.   GI:  No abdominal pain.     MUSCULOSKELETAL:  Extremities:  He does not spontaneously complain of pain in the left foot.    PHYSICAL EXAMINATION:   GENERAL APPEARANCE:  Again, the patient is not systemically unwell.    CHEST/RESPIRATORY:  Shallow, but otherwise clear air entry bilaterally.    CARDIOVASCULAR:   CARDIAC:  3/6  systolic ejection murmur, maximal at the aortic area.   GASTROINTESTINAL:   ABDOMEN:  Soft, nontender.   CIRCULATION:   EDEMA/VARICOSITIES:  Extremities:  There is minimal edema in the left leg.   VASCULAR:   ARTERIAL:  Peripheral pulses are not palpable below the femoral.   SKIN:   INSPECTION:  Wound review:  Over the lateral aspect of the left fifth metatarsal head is a gaping wound with an odor.  The head of the metatarsal appears to be displaced and I wonder if this is totally dislocated from the metatarsophalangeal joint.  There is some mild erythema distally in the plantar aspect, some tenderness.    ASSESSMENT/PLAN:                 Ulcer over the left fifth metatarsal head.  The patient is being treated for diabetes.  Therefore, this is a diabetic ulcer by definition.  One would wonder, looking through his history, whether he had chronic osteomyelitis in this area for a prolonged period that just opened yesterday.  The area seemed displaced to me and I almost wondered about a spontaneous fracture.  I have, therefore, gone ahead and elected to x-ray the foot before deciding on what needs to be done further.  A culture from this area is pending.  It would not be difficult, I do not think, to get a bone biopsy here. Emperic antibiotics are likely to be necessary.   Probable significant macrovascular disease.  He has had an aortogram done previously.  I have elected to go ahead and recheck his noninvasive studies as they do not appear to have been done since April 2015.    The patient's lab work will be updated including his white count, sedimentation rate, C-reactive protein, basic chemistry.  Arterial studies have been ordered, and a plain x-ray.  They will call me with the results of this tomorrow morning.  It is likely he will need IV antibiotics.  MRI of the foot is also not an  impossible scenario.  I am unclear at this point if this can all be arranged from the nursing home.  The exposed  bone here makes healing of this area very suspect.

## 2015-08-26 ENCOUNTER — Ambulatory Visit (HOSPITAL_COMMUNITY)
Admission: RE | Admit: 2015-08-26 | Discharge: 2015-08-26 | Disposition: A | Discharge status: Home / Self Care | Payer: Medicare Other | Source: Ambulatory Visit | Attending: Internal Medicine | Admitting: Internal Medicine

## 2015-08-26 DIAGNOSIS — M86172 Other acute osteomyelitis, left ankle and foot: Secondary | ICD-10-CM | POA: Diagnosis present

## 2015-09-18 ENCOUNTER — Encounter: Payer: Self-pay | Admitting: Internal Medicine

## 2015-09-18 ENCOUNTER — Encounter: Payer: Self-pay | Admitting: Vascular Surgery

## 2015-09-18 ENCOUNTER — Non-Acute Institutional Stay (SKILLED_NURSING_FACILITY): Payer: Medicare Other | Admitting: Internal Medicine

## 2015-09-18 DIAGNOSIS — K219 Gastro-esophageal reflux disease without esophagitis: Secondary | ICD-10-CM | POA: Diagnosis not present

## 2015-09-18 DIAGNOSIS — I110 Hypertensive heart disease with heart failure: Secondary | ICD-10-CM | POA: Diagnosis not present

## 2015-09-18 DIAGNOSIS — I471 Supraventricular tachycardia: Secondary | ICD-10-CM | POA: Diagnosis not present

## 2015-09-18 DIAGNOSIS — I509 Heart failure, unspecified: Secondary | ICD-10-CM

## 2015-09-18 DIAGNOSIS — I4719 Other supraventricular tachycardia: Secondary | ICD-10-CM

## 2015-09-18 NOTE — Assessment & Plan Note (Signed)
Rate controlled at this time on cardizem 360 daily; paln - conr cardizem

## 2015-09-18 NOTE — Assessment & Plan Note (Signed)
Stable at this time; Plan - cont Dilt 360 mg and lasix 40 mg

## 2015-09-18 NOTE — Progress Notes (Signed)
MRN: [id number] Name: [name]  Sex: male Age: 81 y.o. DOB: Mar 19, 1929  [facility] #: [name] Facility/Room: Level Of Care: SNF Provider: Inocencio Homes D Emergency Contacts: Extended Emergency Contact Information Primary Emergency Contact: [name],[name] Address: [address]          [place] Phone: [phone] Mobile Phone: [phone] Relation: Spouse Secondary Emergency Contact: [name],[name]  [place] Mobile Phone: [phone] Relation: Son  Code Status:   Allergies: Review of patient's allergies indicates no known allergies.  Chief Complaint  Patient presents with  . Medical Management of Chronic Issues    HPI: Patient is 81 y.o. male who is a prior heavy smoker with COPD, PVD + Occ SFA-Calcaneal Osteo c Group B strep Bacteremia/Septic shock-completed IV Abx Nov26/2016 and now on Suppressive Amoxicillin 500 bid [foll by Dr. Tommy Medal ID, Dr. [facility]], CKD III, prior MAT c PVC, Sacral decub h/o, Morbid obesity, Body mass index is 27.74 kg/(m^2)., Mo AoS, RLS, Bipolar on meds hospitalized 7/20-24 for sepsis who is being seen for routine follow-up.  Past Medical History  Diagnosis Date  . Hyperlipidemia   . COPD (chronic obstructive pulmonary disease)   . Leg pain   . GERD (gastroesophageal reflux disease)   . Depression   . Dizziness   . Productive cough   . Thyroid disease   . Peripheral vascular disease   . ED (erectile dysfunction)   . Hypertension     08/17/12 - wife denies  . Walking pneumonia 2000's  . H/O hiatal hernia   . Migraine     "when I was a young boy"  . Arthritis     "hands" (08/28/2014)  . Anxiety   . Hypothyroidism   . Chronic kidney disease (CKD), stage III (moderate)     [name] 08/28/2014    Past Surgical History  Procedure Laterality Date  . Renal artery stent Left   . Kidney stone surgery  1989  . Lower extremity angiogram Left 03-20-2014    Dr. Tinnie Gens  .  Tonsillectomy    . Cataract extraction, bilateral Bilateral   . Blepharoplasty Right   . Abdominal aortagram N/A 03/20/2013    Procedure: ABDOMINAL Maxcine Ham;  Surgeon: [name], MD;  Location: [facility] CATH LAB;  Service: Cardiovascular;  Laterality: N/A;  . Lower extremity angiogram Left 03/20/2013    Procedure: LOWER EXTREMITY ANGIOGRAM;  Surgeon: [name], MD;  [facility] C[facility] LAB;  Service: Cardiovascular;  Laterality: Left;      Medication List       This list is accurate as of: 09/18/15  5:24 PM.  Always use your most recent med list.               acetaminophen 325 MG tablet  Commonly known as:  TYLENOL  Take 650 mg by mouth every 6 (six) hours as needed for mild pain, moderate pain, fever or headache.     amoxicillin 500 MG capsule  Commonly known as:  AMOXIL  Take 1 capsule (500 mg total) by mouth every 12 (twelve) hours.     aspirin EC 81 MG tablet  Take 81 mg by mouth every morning.     carbidopa-levodopa 10-100 MG tablet  Commonly known as:  SINEMET IR  Take 1 tablet by mouth every evening.     clindamycin 300 MG capsule  Commonly known as:  CLEOCIN  Take 1 capsule (300 mg total) by mouth every 8 (eight) hours.  cyclobenzaprine 10 MG tablet  Commonly known as:  FLEXERIL  Take 10 mg by mouth 2 (two) times daily.     diltiazem 360 MG 24 hr capsule  Commonly known as:  CARDIZEM CD  Take 1 capsule (360 mg total) by mouth daily.     feeding supplement (ENSURE) Pudg  Take 1 Container by mouth 3 (three) times daily between meals.     fentaNYL 50 MCG/HR  Commonly known as:  DURAGESIC - dosed mcg/hr  Place 1 patch onto the skin every 3 (three) days.     Fluticasone-Salmeterol 250-50 MCG/DOSE Aepb  Commonly known as:  ADVAIR  Inhale 1-2 puffs into the lungs 2 (two) times daily as needed (for wheezing.).     furosemide 40 MG tablet  Commonly known as:  LASIX  Take 1 tablet (40 mg total) by mouth 2 (two) times daily.     gabapentin 300 MG capsule   Commonly known as:  NEURONTIN  Take 1 capsule (300 mg total) by mouth 2 (two) to 3 (three)  times daily.     ipratropium 0.02 % nebulizer solution  Commonly known as:  ATROVENT  Take 500 mcg by nebulization every 6 (six) hours as needed. For shortness of breath     levothyroxine 112 MCG tablet  Commonly known as:  SYNTHROID, LEVOTHROID  Take 112 mcg by mouth every morning.     linagliptin 5 MG Tabs tablet  Commonly known as:  TRADJENTA  Take 1 tablet (5 mg total) by mouth daily.     LORazepam 0.5 MG tablet  Commonly known as:  ATIVAN  Take 0.5 mg by mouth 3 (three) times daily.     MELATIN PO  Take 1 tablet by mouth at bedtime.     Oxycodone HCl 10 MG Tabs  Take 10 mg by mouth every 4 (four) hours as needed (pain).     potassium chloride SA 20 MEQ tablet  Commonly known as:  K-DUR,KLOR-CON  Take 2 tablets (40 mEq total) by mouth once.     QUEtiapine 25 MG tablet  Commonly known as:  SEROQUEL  Take 1 tablet (25 mg total) by mouth at bedtime. May take 1-2 during the day as needed.     sertraline 100 MG tablet  Commonly known as:  ZOLOFT  Take 100 mg by mouth every evening.     simvastatin 40 MG tablet  Commonly known as:  ZOCOR  Take 40 mg by mouth at bedtime.        No orders of the defined types were placed in this encounter.    Immunization History  Administered Date(s) Administered  . Influenza,inj,Quad PF,36+ Mos 08/30/2014    Social History  Substance Use Topics  . Smoking status: Former Smoker -- 1.00 packs/day for 25 years    Types: Cigarettes    Quit date: 08/17/2012  . Smokeless tobacco: Never Used  . Alcohol Use: 0.0 oz/week    0 Standard drinks or equivalent per week     Comment: 08/28/2014 "last drink was in ~ 2012"    Review of Systems  DATA OBTAINED: from nurse GENERAL:  no fevers, fatigue, appetite changes SKIN: No itching, rash HEENT: No complaint RESPIRATORY: No cough, wheezing, SOB CARDIAC: No chest pain, palpitations, lower  extremity edema  GI: No abdominal pain, No N/V/D or constipation, No heartburn or reflux  GU: No dysuria, frequency or urgency, or incontinence  MUSCULOSKELETAL: No unrelieved bone/joint pain NEUROLOGIC: No headache, dizziness  PSYCHIATRIC: No overt anxiety or  sadness  Filed Vitals:   09/18/15 1710  BP: 120/69  Pulse: 74  Temp: 98 F (36.7 C)  Resp: 18    Physical Exam  GENERAL APPEARANCE: Alert,min conversant, No acute distress  SKIN:  wound on foot dressed HEENT: Unremarkable RESPIRATORY: Breathing is even, unlabored. Lung sounds are clear   CARDIOVASCULAR: Heart RRR 3/6 murmur, no rubs or gallops. No peripheral edema  GASTROINTESTINAL: Abdomen is soft, non-tender, not distended w/ normal bowel sounds.  GENITOURINARY: Bladder non tender, not distended  MUSCULOSKELETAL: No abnormal joints or musculature NEUROLOGIC: Cranial nerves 2-12 grossly intact. Moves all extremities PSYCHIATRIC:  no behavioral issues  Patient Active Problem List   Diagnosis Date Noted  . Bipolar disorder 07/14/2015  . Aortic stenosis 07/09/2015  . Chronic diastolic CHF (congestive heart failure) 07/09/2015  . Sepsis due to pneumonia 07/08/2015  . Acute respiratory failure with hypoxia 07/02/2015  . Pulmonary edema 07/02/2015  . Leukocytosis 07/02/2015  . DM2 (diabetes mellitus, type 2) 07/02/2015  . Chronic osteomyelitis of right foot 11/11/2014  . Beta-hemolytic group B streptococcal sepsis 11/11/2014  . Bacteremia 11/11/2014  . Atherosclerotic PVD with ulceration 10/28/2014  . Hyperlipidemia 10/07/2014  . Obesity 10/07/2014  . Moderate aortic stenosis 10/07/2014  . Multifocal atrial tachycardia 10/06/2014  . Sepsis 10/05/2014  . Wound of right leg 08/28/2014  . Foot ulcer 01/04/2013  . Cellulitis 01/04/2013  . PAD (peripheral artery disease) 01/04/2013  . Leg swelling 10/23/2012  . Atherosclerosis of native arteries of the extremities with ulceration 10/23/2012  . Aspiration pneumonia  08/17/2012  . Toxic encephalopathy 08/17/2012  . COPD with acute exacerbation 08/17/2012  . Sacral decubitus ulcer 08/17/2012  . Chronic pain 08/17/2012  . Anxiety disorder 08/17/2012  . Chronic kidney disease, stage III (moderate) 08/17/2012  . Hypothyroidism 02/21/2008  . DYSLIPIDEMIA 02/21/2008  . ANXIETY DEPRESSION 02/21/2008  . RHEUMATIC FEVER 02/21/2008  . Hypertensive heart disease with CHF 02/21/2008  . COPD 02/21/2008  . ESOPHAGEAL MOTILITY DISORDER 02/21/2008  . ESOPHAGEAL DIVERTICULUM 02/21/2008  . SLEEP APNEA 02/21/2008  . ATHEROSCLEROSIS 07/11/2007  . INGUINAL HERNIAS, BILATERAL 07/11/2007  . HERNIA, UMBILICAL 27/78/2423  . CHOLELITHIASIS 07/11/2007  . UNSPECIFIED RENAL SCLEROSIS 07/11/2007  . TUBULOVILLOUS ADENOMA, COLON 11/02/2005  . GERD 11/02/2005  . DIVERTICULOSIS OF COLON 11/02/2005  . MELANOSIS COLI 11/02/2005    CBC    Component Value Date/Time   WBC 14.5* 07/12/2015 0505   RBC 4.83 07/12/2015 0505   HGB 12.6* 07/12/2015 0505   HCT 39.4 07/12/2015 0505   PLT 225 07/12/2015 0505   MCV 81.6 07/12/2015 0505   LYMPHSABS 1.3 07/12/2015 0505   MONOABS 1.1* 07/12/2015 0505   EOSABS 0.2 07/12/2015 0505   BASOSABS 0.0 07/12/2015 0505    CMP     Component Value Date/Time   NA 141 07/12/2015 0505   K 3.8 07/12/2015 0505   CL 101 07/12/2015 0505   CO2 29 07/12/2015 0505   GLUCOSE 135* 07/12/2015 0505   BUN 33* 07/12/2015 0505   CREATININE 1.66* 07/12/2015 0505   CALCIUM 9.0 07/12/2015 0505   PROT 6.8 07/10/2015 0423   ALBUMIN 3.0* 07/10/2015 0423   AST 16 07/10/2015 0423   ALT 12* 07/10/2015 0423   ALKPHOS 71 07/10/2015 0423   BILITOT 1.1 07/10/2015 0423   GFRNONAA 36* 07/12/2015 0505   GFRAA 42* 07/12/2015 0505    Assessment and Plan  Hypertensive heart disease with CHF Stable at this time; Plan - cont Dilt 360 mg and lasix 40 mg  Multifocal  atrial tachycardia Rate controlled at this time on cardizem 360 daily; paln - conr  cardizem  GERD ST noted some  Reflux, hx GERD prior ;Plan - start omeprazole 20 mg daily    Hennie Duos, MD

## 2015-09-18 NOTE — Assessment & Plan Note (Signed)
ST noted some  Reflux, hx GERD prior ;Plan - start omeprazole 20 mg daily

## 2015-09-22 ENCOUNTER — Ambulatory Visit: Payer: Medicare Other | Admitting: Vascular Surgery

## 2015-09-22 ENCOUNTER — Non-Acute Institutional Stay (SKILLED_NURSING_FACILITY): Payer: Medicare Other | Admitting: Internal Medicine

## 2015-09-22 DIAGNOSIS — B3789 Other sites of candidiasis: Secondary | ICD-10-CM

## 2015-09-23 ENCOUNTER — Encounter: Payer: Self-pay | Admitting: Internal Medicine

## 2015-09-23 DIAGNOSIS — B3789 Other sites of candidiasis: Secondary | ICD-10-CM | POA: Insufficient documentation

## 2015-09-23 NOTE — Progress Notes (Signed)
MRN: [id number] Name: [name]  Sex: male Age: 81 y.o. DOB: 06/17/1929  [place] #: [name] Facility/Room:217 Level Of Care: SNF Provider: Inocencio Homes D Emergency Contacts: Extended Emergency Contact Information Primary Emergency Contact: [name] Address: [address]          [place] Phone: [phone] Mobile Phone: [phone] Relation: Spouse Secondary Emergency Contact: [name]  [place] Mobile Phone: [phone] Relation: Son  Code Status:   Allergies: Review of patient's allergies indicates no known allergies.  Chief Complaint  Patient presents with  . Acute Visit    HPI: Patient is 81 y.o. male who is being seen today for new onset erythema R GU area and scrotum as reported per wound care nurse. No open areas, no itching, no fever, onset 1 day.Not better or worse with any position/activity.  Past Medical History  Diagnosis Date  . Hyperlipidemia   . COPD (chronic obstructive pulmonary disease) ([place])   . Leg pain   . GERD (gastroesophageal reflux disease)   . Depression   . Dizziness   . Productive cough   . Thyroid disease   . Peripheral vascular disease ([place])   . ED (erectile dysfunction)   . Hypertension     08/17/12 - wife denies  . Walking pneumonia 2000's  . H/O hiatal hernia   . Migraine     "when I was a young boy"  . Arthritis     "hands" (08/28/2014)  . Anxiety   . Hypothyroidism   . Chronic kidney disease (CKD), stage III (moderate)     [name] 08/28/2014    Past Surgical History  Procedure Laterality Date  . Renal artery stent Left   . Kidney stone surgery  1989  . Lower extremity angiogram Left 03-20-2014    Dr. Tinnie Gens  . Tonsillectomy    . Cataract extraction, bilateral Bilateral   . Blepharoplasty Right   . Abdominal aortagram N/A 03/20/2013    Procedure: ABDOMINAL Maxcine Ham;  Surgeon: [name], MD;  Location: [facility] CATH LAB;  Service:  Cardiovascular;  Laterality: N/A;  . Lower extremity angiogram Left 03/20/2013    Procedure: LOWER EXTREMITY ANGIOGRAM;  Surgeon: [name], MD;  Location: [facility] CATH LAB;  Service: Cardiovascular;  Laterality: Left;      Medication List       This list is accurate as of: 09/22/15 11:59 PM.  Always use your most recent med list.               acetaminophen 325 MG tablet  Commonly known as:  TYLENOL  Take 650 mg by mouth every 6 (six) hours as needed for mild pain, moderate pain, fever or headache.     amoxicillin 500 MG capsule  Commonly known as:  AMOXIL  Take 1 capsule (500 mg total) by mouth every 12 (twelve) hours.     aspirin EC 81 MG tablet  Take 81 mg by mouth every morning.     carbidopa-levodopa 10-100 MG tablet  Commonly known as:  SINEMET IR  Take 1 tablet by mouth every evening.     clindamycin 300 MG capsule  Commonly known as:  CLEOCIN  Take 1 capsule (300 mg total) by mouth every 8 (eight) hours.     cyclobenzaprine 10 MG tablet  Commonly known as:  FLEXERIL  Take 10 mg by mouth 2 (two) times daily.     diltiazem 360 MG 24 hr capsule  Commonly known as:  CARDIZEM CD  Take 1 capsule (360 mg total) by mouth daily.     feeding supplement (ENSURE) Pudg  Take 1 Container by mouth 3 (three) times daily between meals.     fentaNYL 50 MCG/HR  Commonly known as:  DURAGESIC - dosed mcg/hr  Place 1 patch onto the skin every 3 (three) days.     Fluticasone-Salmeterol 250-50 MCG/DOSE Aepb  Commonly known as:  ADVAIR  Inhale 1-2 puffs into the lungs 2 (two) times daily as needed (for wheezing.).     furosemide 40 MG tablet  Commonly known as:  LASIX  Take 1 tablet (40 mg total) by mouth 2 (two) times daily.     gabapentin 300 MG capsule  Commonly known as:  NEURONTIN  Take 1 capsule (300 mg total) by mouth 2 (two) to 3 (three)  times daily.     ipratropium 0.02 % nebulizer solution  Commonly known as:  ATROVENT  Take 500 mcg by nebulization every 6 (six)  hours as needed. For shortness of breath     levothyroxine 112 MCG tablet  Commonly known as:  SYNTHROID, LEVOTHROID  Take 112 mcg by mouth every morning.     linagliptin 5 MG Tabs tablet  Commonly known as:  TRADJENTA  Take 1 tablet (5 mg total) by mouth daily.     LORazepam 0.5 MG tablet  Commonly known as:  ATIVAN  Take 0.5 mg by mouth 3 (three) times daily.     MELATIN PO  Take 1 tablet by mouth at bedtime.     Oxycodone HCl 10 MG Tabs  Take 10 mg by mouth every 4 (four) hours as needed (pain).     potassium chloride SA 20 MEQ tablet  Commonly known as:  K-DUR,KLOR-CON  Take 2 tablets (40 mEq total) by mouth once.     QUEtiapine 25 MG tablet  Commonly known as:  SEROQUEL  Take 1 tablet (25 mg total) by mouth at bedtime. May take 1-2 during the day as needed.     sertraline 100 MG tablet  Commonly known as:  ZOLOFT  Take 100 mg by mouth every evening.     simvastatin 40 MG tablet  Commonly known as:  ZOCOR  Take 40 mg by mouth at bedtime.        No orders of the defined types were placed in this encounter.    Immunization History  Administered Date(s) Administered  . Influenza,inj,Quad PF,36+ Mos 08/30/2014    Social History  Substance Use Topics  . Smoking status: Former Smoker -- 1.00 packs/day for 25 years    Types: Cigarettes    Quit date: 08/17/2012  . Smokeless tobacco: Never Used  . Alcohol Use: 0.0 oz/week    0 Standard drinks or equivalent per week     Comment: 08/28/2014 "last drink was in ~ 2012"    Review of Systems  DATA OBTAINED: from nurse GENERAL:  no fevers, fatigue, appetite changes SKIN:GU erythema as per HPI HEENT: No complaint RESPIRATORY: No cough, wheezing, SOB CARDIAC: No chest pain, palpitations, lower extremity edema  GI: No abdominal pain, No N/V/D or constipation, No heartburn or reflux  GU: No dysuria, frequency or urgency, or incontinence  MUSCULOSKELETAL: No unrelieved bone/joint pain NEUROLOGIC: No headache,  dizziness  PSYCHIATRIC: No overt anxiety or sadness  Filed Vitals:   09/23/15 1149  BP: 120/69  Pulse: 82  Temp: 98 F (36.7 C)  Resp: 20    Physical Exam  GENERAL APPEARANCE: Alert, min conversant,  No acute distress  SKIN: see GU HEENT: Unremarkable RESPIRATORY: Breathing is even, unlabored. Lung sounds are clear   CARDIOVASCULAR: Heart RRR no murmurs, rubs or gallops. No peripheral edema  GASTROINTESTINAL: Abdomen is soft, non-tender, not distended w/ normal bowel sounds.  GENITOURINARY: Bladder non tender, not distended; R groin and scrotum with mild erythema, no circumferential, no swelling;L groin small redness, minmal to scrotum  MUSCULOSKELETAL: No abnormal joints or musculature NEUROLOGIC: Cranial nerves 2-12 grossly intact. Moves all extremities PSYCHIATRIC: Mood and affect appropriate to situation with dementia, no behavioral issues  Patient Active Problem List   Diagnosis Date Noted  . Candida rash of groin 09/23/2015  . Bipolar disorder ([place]) 07/14/2015  . Aortic stenosis 07/09/2015  . Chronic diastolic CHF (congestive heart failure) ([place]) 07/09/2015  . Sepsis due to pneumonia ([place]) 07/08/2015  . Acute respiratory failure with hypoxia ([place]) 07/02/2015  . Pulmonary edema 07/02/2015  . Leukocytosis 07/02/2015  . DM2 (diabetes mellitus, type 2) ([place]) 07/02/2015  . Chronic osteomyelitis of right foot ([place]) 11/11/2014  . Beta-hemolytic group B streptococcal sepsis ([place]) 11/11/2014  . Bacteremia 11/11/2014  . Atherosclerotic PVD with ulceration ([place]) 10/28/2014  . Hyperlipidemia 10/07/2014  . Obesity 10/07/2014  . Moderate aortic stenosis 10/07/2014  . Multifocal atrial tachycardia ([place]) 10/06/2014  . Sepsis ([place]) 10/05/2014  . Wound of right leg 08/28/2014  . Foot ulcer ([place]) 01/04/2013  . Cellulitis 01/04/2013  . PAD (peripheral artery disease) ([place]) 01/04/2013  . Leg swelling 10/23/2012  . Atherosclerosis of native arteries of the extremities with ulceration  ([place]) 10/23/2012  . Aspiration pneumonia ([place]) 08/17/2012  . Toxic encephalopathy 08/17/2012  . COPD with acute exacerbation ([place]) 08/17/2012  . Sacral decubitus ulcer 08/17/2012  . Chronic pain 08/17/2012  . Anxiety disorder 08/17/2012  . Chronic kidney disease, stage III (moderate) 08/17/2012  . Hypothyroidism 02/21/2008  . DYSLIPIDEMIA 02/21/2008  . ANXIETY DEPRESSION 02/21/2008  . RHEUMATIC FEVER 02/21/2008  . Hypertensive heart disease with CHF ([place]) 02/21/2008  . COPD 02/21/2008  . ESOPHAGEAL MOTILITY DISORDER 02/21/2008  . ESOPHAGEAL DIVERTICULUM 02/21/2008  . SLEEP APNEA 02/21/2008  . ATHEROSCLEROSIS 07/11/2007  . INGUINAL HERNIAS, BILATERAL 07/11/2007  . HERNIA, UMBILICAL 94/17/4081  . CHOLELITHIASIS 07/11/2007  . UNSPECIFIED RENAL SCLEROSIS 07/11/2007  . TUBULOVILLOUS ADENOMA, COLON 11/02/2005  . GERD 11/02/2005  . DIVERTICULOSIS OF COLON 11/02/2005  . MELANOSIS COLI 11/02/2005    CBC    Component Value Date/Time   WBC 14.5* 07/12/2015 0505   RBC 4.83 07/12/2015 0505   HGB 12.6* 07/12/2015 0505   HCT 39.4 07/12/2015 0505   PLT 225 07/12/2015 0505   MCV 81.6 07/12/2015 0505   LYMPHSABS 1.3 07/12/2015 0505   MONOABS 1.1* 07/12/2015 0505   EOSABS 0.2 07/12/2015 0505   BASOSABS 0.0 07/12/2015 0505    CMP     Component Value Date/Time   NA 141 07/12/2015 0505   K 3.8 07/12/2015 0505   CL 101 07/12/2015 0505   CO2 29 07/12/2015 0505   GLUCOSE 135* 07/12/2015 0505   BUN 33* 07/12/2015 0505   CREATININE 1.66* 07/12/2015 0505   CALCIUM 9.0 07/12/2015 0505   PROT 6.8 07/10/2015 0423   ALBUMIN 3.0* 07/10/2015 0423   AST 16 07/10/2015 0423   ALT 12* 07/10/2015 0423   ALKPHOS 71 07/10/2015 0423   BILITOT 1.1 07/10/2015 0423   GFRNONAA 36* 07/12/2015 0505   GFRAA 42* 07/12/2015 0505    Assessment and Plan  Candida rash of groin Early, mild but involving scrotum - diflucan  150 mg now and repeat in 72 hours    Hennie Duos, MD

## 2015-09-23 NOTE — Assessment & Plan Note (Signed)
Early, mild but involving scrotum - diflucan 150 mg now and repeat in 72 hours

## 2015-10-09 ENCOUNTER — Encounter: Payer: Self-pay | Admitting: Vascular Surgery

## 2015-10-13 ENCOUNTER — Ambulatory Visit (INDEPENDENT_AMBULATORY_CARE_PROVIDER_SITE_OTHER): Payer: Medicare Other | Admitting: Vascular Surgery

## 2015-10-13 ENCOUNTER — Encounter: Payer: Self-pay | Admitting: Vascular Surgery

## 2015-10-13 VITALS — BP 134/67 | HR 80 | Temp 97.1°F | Resp 16 | Wt 158.7 lb

## 2015-10-13 DIAGNOSIS — I70234 Atherosclerosis of native arteries of right leg with ulceration of heel and midfoot: Secondary | ICD-10-CM | POA: Diagnosis not present

## 2015-10-13 DIAGNOSIS — I70244 Atherosclerosis of native arteries of left leg with ulceration of heel and midfoot: Secondary | ICD-10-CM

## 2015-10-13 NOTE — Progress Notes (Signed)
The patient presents today for discussion of lower extremity arterial insufficiency and wounds on his feet. He is well known to me from many years of evaluation of his arterial disease. He did most recently have arteriogram looking at his left leg in April 2014 arteriogram looking at his right leg in September 2015. He is having a great deal of weight loss since my last visit with him. He is now wheelchair-bound and reports that he is unable to walk due to weakness. He is now also in a nursing facility which is not the case in the last visit with him. He is alert and oriented. Conversant regarding the upcoming presidential election. He denies any pain in his lower extremities.  Past Medical History  Diagnosis Date  . Hyperlipidemia   . COPD (chronic obstructive pulmonary disease) ([place])   . Leg pain   . GERD (gastroesophageal reflux disease)   . Depression   . Dizziness   . Productive cough   . Thyroid disease   . Peripheral vascular disease ([place])   . ED (erectile dysfunction)   . Hypertension     08/17/12 - wife denies  . Walking pneumonia 2000's  . H/O hiatal hernia   . Migraine     "when I was a young boy"  . Arthritis     "hands" (08/28/2014)  . Anxiety   . Hypothyroidism   . Chronic kidney disease (CKD), stage III (moderate)     [name] 08/28/2014    Social History  Substance Use Topics  . Smoking status: Former Smoker -- 1.00 packs/day for 25 years    Types: Cigarettes    Quit date: 08/17/2012  . Smokeless tobacco: Never Used  . Alcohol Use: 0.0 oz/week    0 Standard drinks or equivalent per week     Comment: 08/28/2014 "last drink was in ~ 2012"    Family History  Problem Relation Age of Onset  . Other Mother     AAA  . AAA (abdominal aortic aneurysm) Mother   . Heart attack Father   . Alcohol abuse Father   . Diabetes Father   . Other Father     amputation  . Diabetes Brother   . Coronary artery disease Brother   . Dementia Brother     No Known  Allergies   Current outpatient prescriptions:  .  acetaminophen (TYLENOL) 325 MG tablet, Take 650 mg by mouth every 6 (six) hours as needed for mild pain, moderate pain, fever or headache. , Disp: , Rfl:  .  aspirin EC 81 MG tablet, Take 81 mg by mouth every morning. , Disp: , Rfl:  .  carbidopa-levodopa (SINEMET) 10-100 MG per tablet, Take 1 tablet by mouth every evening. , Disp: , Rfl:  .  cyclobenzaprine (FLEXERIL) 10 MG tablet, Take 10 mg by mouth 2 (two) times daily., Disp: , Rfl: 0 .  diltiazem (CARDIZEM CD) 360 MG 24 hr capsule, Take 1 capsule (360 mg total) by mouth daily., Disp: 30 capsule, Rfl: 6 .  feeding supplement (ENSURE) PUDG, Take 1 Container by mouth 3 (three) times daily between meals., Disp: , Rfl:  .  fentaNYL (DURAGESIC - DOSED MCG/HR) 50 MCG/HR, Place 1 patch onto the skin every 3 (three) days., Disp: , Rfl: 0 .  Fluticasone-Salmeterol (ADVAIR) 250-50 MCG/DOSE AEPB, Inhale 1-2 puffs into the lungs 2 (two) times daily as needed (for wheezing.)., Disp: , Rfl:  .  furosemide (LASIX) 40 MG tablet, Take 1 tablet (40 mg total) by mouth  2 (two) times daily., Disp: 30 tablet, Rfl: 0 .  gabapentin (NEURONTIN) 300 MG capsule, Take 1 capsule (300 mg total) by mouth 2 (two) to 3 (three)  times daily., Disp: , Rfl:  .  ipratropium (ATROVENT) 0.02 % nebulizer solution, Take 500 mcg by nebulization every 6 (six) hours as needed. For shortness of breath, Disp: , Rfl:  .  levothyroxine (SYNTHROID, LEVOTHROID) 112 MCG tablet, Take 112 mcg by mouth every morning. , Disp: , Rfl:  .  linagliptin (TRADJENTA) 5 MG TABS tablet, Take 1 tablet (5 mg total) by mouth daily., Disp: 30 tablet, Rfl: 2 .  LORazepam (ATIVAN) 0.5 MG tablet, Take 0.5 mg by mouth 3 (three) times daily. , Disp: , Rfl:  .  Melatonin-Pyridoxine (MELATIN PO), Take 1 tablet by mouth at bedtime. , Disp: , Rfl:  .  Oxycodone HCl 10 MG TABS, Take 10 mg by mouth every 4 (four) hours as needed (pain). , Disp: , Rfl: 0 .  potassium  chloride SA (K-DUR,KLOR-CON) 20 MEQ tablet, Take 2 tablets (40 mEq total) by mouth once. (Patient taking differently: Take 40 mEq by mouth daily. ), Disp: 60 tablet, Rfl: 0 .  QUEtiapine (SEROQUEL) 25 MG tablet, Take 1 tablet (25 mg total) by mouth at bedtime. May take 1-2 during the day as needed., Disp: , Rfl:  .  sertraline (ZOLOFT) 100 MG tablet, Take 100 mg by mouth every evening. , Disp: , Rfl:  .  simvastatin (ZOCOR) 40 MG tablet, Take 40 mg by mouth at bedtime. , Disp: , Rfl:  .  amoxicillin (AMOXIL) 500 MG capsule, Take 1 capsule (500 mg total) by mouth every 12 (twelve) hours. (Patient not taking: Reported on 10/13/2015), Disp: 60 capsule, Rfl: 3 .  clindamycin (CLEOCIN) 300 MG capsule, Take 1 capsule (300 mg total) by mouth every 8 (eight) hours. (Patient not taking: Reported on 10/13/2015), Disp: 18 capsule, Rfl: 0  Filed Vitals:   10/13/15 1112 10/13/15 1120  BP: 149/62 134/67  Pulse: 76 80  Temp: 97.1 F (36.2 C)   TempSrc: Oral   Resp: 16   Weight: 158 lb 11.7 oz (72 kg)   SpO2: 98%     Body mass index is 21.52 kg/(m^2).       On physical exam: His feet are warm bilaterally. He does have significant pitting edema bilaterally. He had appropriate wrapping of his lower extremity easily presented to our office. His right heel ulcer is extremely superficial and small and is essentially healed. On the left he does have a 1 x 2 cm clean punched out ulcer on the lateral aspect of his foot at the base of his fifth toe. No surrounding erythema. No palpable pedal pulses.  Noninvasive studies suggesting moderate to severe arterial insufficiency.  Prior arterial superficial femoral artery occlusions chronically. On the left single vessel runoff via the anterior tibial artery.  Impression and plan chronic lower extremity arterial insufficiency. Has been able to heal ulceration such as these in the past. Do not feel he would be a candidate for fem-tib or femoropopliteal bypass and he  does not want to consider this. May come to amputation if he has progressive nonhealing ulceration. Would recommend continued local wound care and compression. He will see [place] again on as-needed basis

## 2015-10-13 NOTE — Progress Notes (Signed)
Filed Vitals:   10/13/15 1112 10/13/15 1120  BP: 149/62 134/67  Pulse: 76 80  Temp: 97.1 F (36.2 C)   TempSrc: Oral   Resp: 16   Weight: 158 lb 11.7 oz (72 kg)   SpO2: 98%

## 2015-10-21 ENCOUNTER — Non-Acute Institutional Stay (SKILLED_NURSING_FACILITY): Payer: Medicare Other | Admitting: Internal Medicine

## 2015-10-21 ENCOUNTER — Encounter: Payer: Self-pay | Admitting: Internal Medicine

## 2015-10-21 DIAGNOSIS — I7025 Atherosclerosis of native arteries of other extremities with ulceration: Secondary | ICD-10-CM | POA: Diagnosis not present

## 2015-10-21 DIAGNOSIS — I5032 Chronic diastolic (congestive) heart failure: Secondary | ICD-10-CM

## 2015-10-21 DIAGNOSIS — I739 Peripheral vascular disease, unspecified: Secondary | ICD-10-CM | POA: Diagnosis not present

## 2015-10-21 NOTE — Progress Notes (Signed)
MRN: [id number] Name: [name]  Sex: male Age: 81 y.o. DOB: 05-Nov-1929  [facility]: [name] Facility/Room: Level Of Care: SNF Provider: Inocencio Homes D Emergency Contacts: Extended Emergency Contact Information Primary Emergency Contact: [name],[name] Address: [address]          [place] Phone: [phone] Mobile Phone: [phone] Relation: Spouse Secondary Emergency Contact: [name],[name]  [place] Mobile Phone: [phone] Relation: Son  Code Status:   Allergies: Review of patient's allergies indicates no known allergies.  Chief Complaint  Patient presents with  . Medical Management of Chronic Issues    HPI: Patient is 81 y.o. male wth PVD with extremity ulcers, COPD, GERD, depression, HTN , CHF, CKD, OA who is being seen today for routine issues of LE ulcerations, PVD and CHF.   Past Medical History  Diagnosis Date  . Hyperlipidemia   . COPD (chronic obstructive pulmonary disease) ([place])   . Leg pain   . GERD (gastroesophageal reflux disease)   . Depression   . Dizziness   . Productive cough   . Thyroid disease   . Peripheral vascular disease ([place])   . ED (erectile dysfunction)   . Hypertension     08/17/12 - wife denies  . Walking pneumonia 2000's  . H/O hiatal hernia   . Migraine     "when I was a young boy"  . Arthritis     "hands" (08/28/2014)  . Anxiety   . Hypothyroidism   . Chronic kidney disease (CKD), stage III (moderate)     [name] 08/28/2014    Past Surgical History  Procedure Laterality Date  . Renal artery stent Left   . Kidney stone surgery  1989  . Lower extremity angiogram Left 03-20-2014    Dr. Tinnie Gens  . Tonsillectomy    . Cataract extraction, bilateral Bilateral   . Blepharoplasty Right   . Abdominal aortagram N/A 03/20/2013    Procedure: ABDOMINAL Maxcine Ham;  Surgeon: [name], MD;  Location: [facility] CATH LAB;  Service: Cardiovascular;  Laterality: N/A;   . Lower extremity angiogram Left 03/20/2013    Procedure: LOWER EXTREMITY ANGIOGRAM;  Surgeon: [name], MD;  Location: [facility] CATH LAB;  Service: Cardiovascular;  Laterality: Left;      Medication List       This list is accurate as of: 10/21/15 11:59 PM.  Always use your most recent med list.               acetaminophen 325 MG tablet  Commonly known as:  TYLENOL  Take 650 mg by mouth every 6 (six) hours as needed for mild pain, moderate pain, fever or headache.     amoxicillin 500 MG capsule  Commonly known as:  AMOXIL  Take 1 capsule (500 mg total) by mouth every 12 (twelve) hours.     aspirin EC 81 MG tablet  Take 81 mg by mouth every morning.     carbidopa-levodopa 10-100 MG tablet  Commonly known as:  SINEMET IR  Take 1 tablet by mouth every evening.     clindamycin 300 MG capsule  Commonly known as:  CLEOCIN  Take 1 capsule (300 mg total) by mouth every 8 (eight) hours.     cyclobenzaprine 10 MG tablet  Commonly known as:  FLEXERIL  Take 10 mg by mouth 2 (two) times daily.     diltiazem 360 MG 24 hr capsule  Commonly known as:  CARDIZEM CD  Take  1 capsule (360 mg total) by mouth daily.     feeding supplement (ENSURE) Pudg  Take 1 Container by mouth 3 (three) times daily between meals.     fentaNYL 50 MCG/HR  Commonly known as:  DURAGESIC - dosed mcg/hr  Place 1 patch onto the skin every 3 (three) days.     Fluticasone-Salmeterol 250-50 MCG/DOSE Aepb  Commonly known as:  ADVAIR  Inhale 1-2 puffs into the lungs 2 (two) times daily as needed (for wheezing.).     furosemide 40 MG tablet  Commonly known as:  LASIX  Take 1 tablet (40 mg total) by mouth 2 (two) times daily.     gabapentin 300 MG capsule  Commonly known as:  NEURONTIN  Take 1 capsule (300 mg total) by mouth 2 (two) to 3 (three)  times daily.     ipratropium 0.02 % nebulizer solution  Commonly known as:  ATROVENT  Take 500 mcg by nebulization every 6 (six) hours as needed. For shortness of  breath     levothyroxine 112 MCG tablet  Commonly known as:  SYNTHROID, LEVOTHROID  Take 112 mcg by mouth every morning.     linagliptin 5 MG Tabs tablet  Commonly known as:  TRADJENTA  Take 1 tablet (5 mg total) by mouth daily.     LORazepam 0.5 MG tablet  Commonly known as:  ATIVAN  Take 0.5 mg by mouth 3 (three) times daily.     MELATIN PO  Take 1 tablet by mouth at bedtime.     Oxycodone HCl 10 MG Tabs  Take 10 mg by mouth every 4 (four) hours as needed (pain).     potassium chloride SA 20 MEQ tablet  Commonly known as:  K-DUR,KLOR-CON  Take 2 tablets (40 mEq total) by mouth once.     QUEtiapine 25 MG tablet  Commonly known as:  SEROQUEL  Take 1 tablet (25 mg total) by mouth at bedtime. May take 1-2 during the day as needed.     sertraline 100 MG tablet  Commonly known as:  ZOLOFT  Take 100 mg by mouth every evening.     simvastatin 40 MG tablet  Commonly known as:  ZOCOR  Take 40 mg by mouth at bedtime.        No orders of the defined types were placed in this encounter.    Immunization History  Administered Date(s) Administered  . Influenza,inj,Quad PF,36+ Mos 08/30/2014    Social History  Substance Use Topics  . Smoking status: Former Smoker -- 1.00 packs/day for 25 years    Types: Cigarettes    Quit date: 08/17/2012  . Smokeless tobacco: Never Used  . Alcohol Use: 0.0 oz/week    0 Standard drinks or equivalent per week     Comment: 08/28/2014 "last drink was in ~ 2012"    Review of Systems  DATA OBTAINED: from patient, nurse GENERAL:  no fevers, fatigue, appetite changes SKIN: No itching, rash HEENT: No complaint RESPIRATORY: No cough, wheezing, SOB CARDIAC: No chest pain, palpitations, lower extremity edema  GI: No abdominal pain, No N/V/D or constipation, No heartburn or reflux  GU: No dysuria, frequency or urgency, or incontinence  MUSCULOSKELETAL: No unrelieved bone/joint pain NEUROLOGIC: No headache, dizziness  PSYCHIATRIC: No overt  anxiety or sadness  Filed Vitals:   10/21/15 1841  BP: 120/69  Pulse: 88  Temp: 98 F (36.7 C)  Resp: 20    Physical Exam  GENERAL APPEARANCE: Alert, conversant, No acute distress  SKIN:  No diaphoresis rash; leg wrapped HEENT: Unremarkable RESPIRATORY: Breathing is even, unlabored. Lung sounds are clear   CARDIOVASCULAR: Heart RRR no murmurs, rubs or gallops. No peripheral edema  GASTROINTESTINAL: Abdomen is soft, non-tender, not distended w/ normal bowel sounds.  GENITOURINARY: Bladder non tender, not distended  MUSCULOSKELETAL: No abnormal joints or musculature NEUROLOGIC: Cranial nerves 2-12 grossly intact. Moves all extremities PSYCHIATRIC: Mood and affect appropriate to situation, no behavioral issues  Patient Active Problem List   Diagnosis Date Noted  . Candida rash of groin 09/23/2015  . Bipolar disorder ([place]) 07/14/2015  . Aortic stenosis 07/09/2015  . Chronic diastolic CHF (congestive heart failure) ([place]) 07/09/2015  . Sepsis due to pneumonia ([place]) 07/08/2015  . Acute respiratory failure with hypoxia ([place]) 07/02/2015  . Pulmonary edema 07/02/2015  . Leukocytosis 07/02/2015  . DM2 (diabetes mellitus, type 2) ([place]) 07/02/2015  . Chronic osteomyelitis of right foot ([place]) 11/11/2014  . Beta-hemolytic group B streptococcal sepsis ([place]) 11/11/2014  . Bacteremia 11/11/2014  . Atherosclerotic PVD with ulceration ([place]) 10/28/2014  . Hyperlipidemia 10/07/2014  . Obesity 10/07/2014  . Moderate aortic stenosis 10/07/2014  . Multifocal atrial tachycardia ([place]) 10/06/2014  . Sepsis ([place]) 10/05/2014  . Wound of right leg 08/28/2014  . Foot ulcer ([place]) 01/04/2013  . Cellulitis 01/04/2013  . PAD (peripheral artery disease) ([place]) 01/04/2013  . Leg swelling 10/23/2012  . Atherosclerosis of native arteries of the extremities with ulceration ([place]) 10/23/2012  . Aspiration pneumonia ([place]) 08/17/2012  . Toxic encephalopathy 08/17/2012  . COPD with acute exacerbation ([place])  08/17/2012  . Sacral decubitus ulcer 08/17/2012  . Chronic pain 08/17/2012  . Anxiety disorder 08/17/2012  . Chronic kidney disease, stage III (moderate) 08/17/2012  . Hypothyroidism 02/21/2008  . DYSLIPIDEMIA 02/21/2008  . ANXIETY DEPRESSION 02/21/2008  . RHEUMATIC FEVER 02/21/2008  . Hypertensive heart disease with CHF ([place]) 02/21/2008  . COPD 02/21/2008  . ESOPHAGEAL MOTILITY DISORDER 02/21/2008  . ESOPHAGEAL DIVERTICULUM 02/21/2008  . SLEEP APNEA 02/21/2008  . ATHEROSCLEROSIS 07/11/2007  . INGUINAL HERNIAS, BILATERAL 07/11/2007  . HERNIA, UMBILICAL 14/78/2956  . CHOLELITHIASIS 07/11/2007  . UNSPECIFIED RENAL SCLEROSIS 07/11/2007  . TUBULOVILLOUS ADENOMA, COLON 11/02/2005  . GERD 11/02/2005  . DIVERTICULOSIS OF COLON 11/02/2005  . MELANOSIS COLI 11/02/2005    CBC    Component Value Date/Time   WBC 14.5* 07/12/2015 0505   RBC 4.83 07/12/2015 0505   HGB 12.6* 07/12/2015 0505   HCT 39.4 07/12/2015 0505   PLT 225 07/12/2015 0505   MCV 81.6 07/12/2015 0505   LYMPHSABS 1.3 07/12/2015 0505   MONOABS 1.1* 07/12/2015 0505   EOSABS 0.2 07/12/2015 0505   BASOSABS 0.0 07/12/2015 0505    CMP     Component Value Date/Time   NA 141 07/12/2015 0505   K 3.8 07/12/2015 0505   CL 101 07/12/2015 0505   CO2 29 07/12/2015 0505   GLUCOSE 135* 07/12/2015 0505   BUN 33* 07/12/2015 0505   CREATININE 1.66* 07/12/2015 0505   CALCIUM 9.0 07/12/2015 0505   PROT 6.8 07/10/2015 0423   ALBUMIN 3.0* 07/10/2015 0423   AST 16 07/10/2015 0423   ALT 12* 07/10/2015 0423   ALKPHOS 71 07/10/2015 0423   BILITOT 1.1 07/10/2015 0423   GFRNONAA 36* 07/12/2015 0505   GFRAA 42* 07/12/2015 0505    Assessment and Plan  Atherosclerosis of native arteries of the extremities with ulceration R heel ulcer almost completely healed, L heel ulcer 1 cm by 2 cm without infection; Plan - continue wound care with leg  wraps LLE to continue  PAD (peripheral artery disease) Per non invasive testing moderate to  severe artierial insufficiency; Plan - ASA 81 mg daily and statin, A1c is  < 7  Chronic diastolic CHF (congestive heart failure) No exacerbations since hospitalization last; Pla - cont lasix and cardizem    Hennie Duos, MD

## 2015-10-25 ENCOUNTER — Encounter: Payer: Self-pay | Admitting: Internal Medicine

## 2015-10-25 NOTE — Assessment & Plan Note (Addendum)
Per non invasive testing moderate to severe artierial insufficiency; Plan - ASA 81 mg daily and statin, A1c is  < 7

## 2015-10-25 NOTE — Assessment & Plan Note (Signed)
R heel ulcer almost completely healed, L heel ulcer 1 cm by 2 cm without infection; Plan - continue wound care with leg wraps LLE to continue

## 2015-10-25 NOTE — Assessment & Plan Note (Signed)
No exacerbations since hospitalization last; Pla - cont lasix and cardizem

## 2015-11-23 ENCOUNTER — Encounter: Payer: Self-pay | Admitting: Internal Medicine

## 2015-11-23 ENCOUNTER — Non-Acute Institutional Stay (SKILLED_NURSING_FACILITY): Payer: Medicare Other | Admitting: Internal Medicine

## 2015-11-23 DIAGNOSIS — E038 Other specified hypothyroidism: Secondary | ICD-10-CM

## 2015-11-23 DIAGNOSIS — I7025 Atherosclerosis of native arteries of other extremities with ulceration: Secondary | ICD-10-CM

## 2015-11-23 DIAGNOSIS — I5032 Chronic diastolic (congestive) heart failure: Secondary | ICD-10-CM | POA: Diagnosis not present

## 2015-11-23 DIAGNOSIS — I951 Orthostatic hypotension: Secondary | ICD-10-CM

## 2015-11-23 DIAGNOSIS — I959 Hypotension, unspecified: Secondary | ICD-10-CM | POA: Insufficient documentation

## 2015-11-23 DIAGNOSIS — I471 Supraventricular tachycardia: Secondary | ICD-10-CM | POA: Diagnosis not present

## 2015-11-23 DIAGNOSIS — N183 Chronic kidney disease, stage 3 unspecified: Secondary | ICD-10-CM

## 2015-11-23 NOTE — Progress Notes (Signed)
Patient ID: [name], male   DOB: August 27, 1929, 81 y.o.   MRN: [id number]  MRN: [id number] Name: [name]  Sex: male Age: 81 y.o. DOB: 08-05-1929  [place] #: [name] Facility/Room: Level Of Care: SNF Provider: Wille Celeste Emergency Contacts: Extended Emergency Contact Information Primary Emergency Contact: [name] Address: [address]          [place] Phone: [phone] Mobile Phone: [phone] Relation: Spouse Secondary Emergency Contact: [name]  [place] Mobile Phone: [phone] Relation: Son  Code Status:   Allergies: Review of patient's allergies indicates no known allergies.  Chief Complaint  Patient presents with  . Acute Visit   and routine visit  HPI: Patient is 81 y.o. male wth PVD with extremity ulcers, COPD, GERD, depression, HTN , CHF, CKD, OA who is being seen today for routine issues He is also being acutely for what appears to be possibly orthostatic hypotension.  Apparently patient stood up at some point this morning complained of dizziness nursing did take his blood pressure and found it to be 90/50-however setting he was 136/78.  Apparently dizziness quickly resolved once she sat down.  Speaking with patient it is somewhat unclear whether this is new this apparently has been happening for some time from what I can tell patient is a somewhat poor historian.  He denied any shortness of breath chest pain visual changes headache during this episode.  Currently he is resting in bed comfortably blood pressure 121/84.  In regards to other issues he appears to be stable he does have cellulitis of his lower extremities this is followed by wound care and he is now on Keflex apparently this has stabilized  I do note he had a gastric consult on 10/13/2015 with a history of moderate to severe arterial insufficiency which complicates his lower leg wounds-he was thought not  to be a candidate for bypass and does not want one apparently.  In regards to his blood pressure issues he is on diltiazem 360 a day is also on Lasix 40 mg twice a day  He does have a history of chronic diastolic CHF he is on Lasix as well as Cardizem-this has been relatively stable this is a challenging situation with his history of renal insufficiency most recent creatinine was 1.6.  Patient is a type II diabetic he is on Tradjenta daily CBGs appear to be stable largely in the 100s range  Per chart review patient does have a history of sepsis with healthcare associated pneumonia- did receive IV antibiotics and apparently this resolved although he certainly remains at risk  He also has a history of bipolar disorder he is on lorazepam as well as Seroquel and Zoloft it appears this has been stable during his stay here.  In regards hypothyroidism he is on Synthroid we will need to update a TSH.  .  .  .  Marland Kitchen   Past Medical History  Diagnosis Date  . Hyperlipidemia   . COPD (chronic obstructive pulmonary disease) ([place])   . Leg pain   . GERD (gastroesophageal reflux disease)   . Depression   . Dizziness   . Productive cough   . Thyroid disease   . Peripheral vascular disease ([place])   . ED (erectile dysfunction)   . Hypertension     08/17/12 - wife denies  . Walking pneumonia 2000's  . H/O hiatal hernia   . Migraine     "when I  was a young boy"  . Arthritis     "hands" (08/28/2014)  . Anxiety   . Hypothyroidism   . Chronic kidney disease (CKD), stage III (moderate)     [name] 08/28/2014    Past Surgical History  Procedure Laterality Date  . Renal artery stent Left   . Kidney stone surgery  1989  . Lower extremity angiogram Left 03-20-2014    Dr. Tinnie Gens  . Tonsillectomy    . Cataract extraction, bilateral Bilateral   . Blepharoplasty Right   . Abdominal aortagram N/A 03/20/2013    Procedure: ABDOMINAL Maxcine Ham;  Surgeon: [name], MD;  Location: [facility] CATH LAB;   Service: Cardiovascular;  Laterality: N/A;  . Lower extremity angiogram Left 03/20/2013    Procedure: LOWER EXTREMITY ANGIOGRAM;  Surgeon: [name], MD;  [facility] C[facility] LAB;  Service: Cardiovascular;  Laterality: Left;      Medication List       This list is accurate as of: 11/23/15 11:59 PM.  Always use your most recent med list.               acetaminophen 325 MG tablet  Commonly known as:  TYLENOL  Take 650 mg by mouth every 6 (six) hours as needed for mild pain, moderate pain, fever or headache.     aspirin EC 81 MG tablet  Take 81 mg by mouth every morning.     carbidopa-levodopa 10-100 MG tablet  Commonly known as:  SINEMET IR  Take 1 tablet by mouth every evening.     clindamycin 300 MG capsule  Commonly known as:  CLEOCIN  Take 1 capsule (300 mg total) by mouth every 8 (eight) hours.     cyclobenzaprine 10 MG tablet  Commonly known as:  FLEXERIL  Take 10 mg by mouth 2 (two) times daily.     diltiazem 360 MG 24 hr capsule  Commonly known as:  CARDIZEM CD  Take 1 capsule (360 mg total) by mouth daily.     feeding supplement (ENSURE) Pudg  Take 1 Container by mouth 3 (three) times daily between meals.     fentaNYL 50 MCG/HR  Commonly known as:  DURAGESIC - dosed mcg/hr  Place 1 patch onto the skin every 3 (three) days.     Fluticasone-Salmeterol 250-50 MCG/DOSE Aepb  Commonly known as:  ADVAIR  Inhale 1-2 puffs into the lungs 2 (two) times daily as needed (for wheezing.).     furosemide 40 MG tablet  Commonly known as:  LASIX  Take 1 tablet (40 mg total) by mouth 2 (two) times daily.     gabapentin 300 MG capsule  Commonly known as:  NEURONTIN  Take 1 capsule (300 mg total) by mouth 2 (two) to 3 (three)  times daily.     ipratropium 0.02 % nebulizer solution  Commonly known as:  ATROVENT  Take 500 mcg by nebulization every 6 (six) hours as needed. For shortness of breath     levothyroxine 112 MCG tablet  Commonly known as:  SYNTHROID,  LEVOTHROID  Take 112 mcg by mouth every morning.     linagliptin 5 MG Tabs tablet  Commonly known as:  TRADJENTA  Take 1 tablet (5 mg total) by mouth daily.     LORazepam 0.5 MG tablet  Commonly known as:  ATIVAN  Take 0.5 mg by mouth 3 (three) times daily.     MELATIN PO  Take 1 tablet by mouth at bedtime.     Oxycodone  HCl 10 MG Tabs  Take 5 mg by mouth every 4 (four) hours as needed (pain).     potassium chloride SA 20 MEQ tablet  Commonly known as:  K-DUR,KLOR-CON  Take 2 tablets (40 mEq total) by mouth once.     QUEtiapine 25 MG tablet  Commonly known as:  SEROQUEL  Take 1 tablet (25 mg total) by mouth at bedtime. May take 1-2 during the day as needed.     sertraline 100 MG tablet  Commonly known as:  ZOLOFT  Take 100 mg by mouth every evening.     simvastatin 40 MG tablet  Commonly known as:  ZOCOR  Take 40 mg by mouth at bedtime.        No orders of the defined types were placed in this encounter.    Immunization History  Administered Date(s) Administered  . Influenza,inj,Quad PF,36+ Mos 08/30/2014    Social History  Substance Use Topics  . Smoking status: Former Smoker -- 1.00 packs/day for 25 years    Types: Cigarettes    Quit date: 08/17/2012  . Smokeless tobacco: Never Used  . Alcohol Use: 0.0 oz/week    0 Standard drinks or equivalent per week     Comment: 08/28/2014 "last drink was in ~ 2012"    Review of Systems  DATA OBTAINED: from patient, nurse-wife GENERAL:  no fevers, fatigue, appetite changes SKIN: No itching, rash--history of right heel ulcer apparently has resolved per nursing and wound care HEENT: No complaint RESPIRATORY: No cough, wheezing, SOB CARDIAC: No chest pain, palpitations, lower extremity edema  GI: No abdominal pain, No N/V/D or constipation, No heartburn or reflux  GU: No dysuria, frequency or urgency, or incontinence  MUSCULOSKELETAL: No unrelieved bone/joint pain NEUROLOGIC: No headache, dizziness  PSYCHIATRIC: No  overt anxiety or sadness--no recent behaviors that I am aware of  Filed Vitals:   11/23/15 1436  BP: 136/78  Pulse: 80  Temp: 97.5 F (36.4 C)  Resp: 18    Physical Exam  GENERAL APPEARANCE: Alert, conversant, No acute distress  SKIN: No diaphoresis rash; leg wrapped HEENT: Unremarkable oropharynx is clear mucous membranes moist RESPIRATORY: Breathing is even, unlabored. Lung sounds are clear   CARDIOVASCULAR: Heart RRR with occasional irregular beats no murmurs, rubs or gallops.-Legs are wrapped bilaterally GASTROINTESTINAL: Abdomen is soft, non-tender, not distended w/ normal bowel sounds.  GENITOURINARY: Bladder non tender, not distended  MUSCULOSKELETAL: No abnormal joints or musculature--he is able to stand but is quite weak although does not overtly complain of dizziness NEUROLOGIC: Cranial nerves 2-12 grossly intact. Moves all extremities PSYCHIATRIC: Mood and affect appropriate to situation, no behavioral issues--has some mild cognitive deficits he is pleasant and appropriate although somewhat confused  Patient Active Problem List   Diagnosis Date Noted  . Hypotension 11/23/2015  . Candida rash of groin 09/23/2015  . Bipolar disorder ([place]) 07/14/2015  . Aortic stenosis 07/09/2015  . Chronic diastolic CHF (congestive heart failure) ([place]) 07/09/2015  . Sepsis due to pneumonia ([place]) 07/08/2015  . Acute respiratory failure with hypoxia ([place]) 07/02/2015  . Pulmonary edema 07/02/2015  . Leukocytosis 07/02/2015  . DM2 (diabetes mellitus, type 2) ([place]) 07/02/2015  . Chronic osteomyelitis of right foot ([place]) 11/11/2014  . Beta-hemolytic group B streptococcal sepsis ([place]) 11/11/2014  . Bacteremia 11/11/2014  . Atherosclerotic PVD with ulceration ([place]) 10/28/2014  . Hyperlipidemia 10/07/2014  . Obesity 10/07/2014  . Moderate aortic stenosis 10/07/2014  . Multifocal atrial tachycardia ([place]) 10/06/2014  . Sepsis ([place]) 10/05/2014  . Wound  of right leg 08/28/2014  . Foot ulcer  ([place]) 01/04/2013  . Cellulitis 01/04/2013  . PAD (peripheral artery disease) ([place]) 01/04/2013  . Leg swelling 10/23/2012  . Atherosclerosis of native arteries of the extremities with ulceration ([place]) 10/23/2012  . Aspiration pneumonia ([place]) 08/17/2012  . Toxic encephalopathy 08/17/2012  . COPD with acute exacerbation ([place]) 08/17/2012  . Sacral decubitus ulcer 08/17/2012  . Chronic pain 08/17/2012  . Anxiety disorder 08/17/2012  . Chronic kidney disease, stage III (moderate) 08/17/2012  . Hypothyroidism 02/21/2008  . DYSLIPIDEMIA 02/21/2008  . ANXIETY DEPRESSION 02/21/2008  . RHEUMATIC FEVER 02/21/2008  . Hypertensive heart disease with CHF ([place]) 02/21/2008  . COPD 02/21/2008  . ESOPHAGEAL MOTILITY DISORDER 02/21/2008  . ESOPHAGEAL DIVERTICULUM 02/21/2008  . SLEEP APNEA 02/21/2008  . ATHEROSCLEROSIS 07/11/2007  . INGUINAL HERNIAS, BILATERAL 07/11/2007  . HERNIA, UMBILICAL [id number]  . CHOLELITHIASIS 07/11/2007  . UNSPECIFIED RENAL SCLEROSIS 07/11/2007  . TUBULOVILLOUS ADENOMA, COLON 11/02/2005  . GERD 11/02/2005  . DIVERTICULOSIS OF COLON 11/02/2005  . MELANOSIS COLI 11/02/2005    Labs.  10/07/2015.  Creatinine 1.6.  Over 16 2016.  Sodium 138  BUN 12 creatinine 1.8.  WBC 8.8 hemoglobin 10.3 platelets 214.    CBC    Component Value Date/Time   WBC 14.5* 07/12/2015 0505   RBC 4.83 07/12/2015 0505   HGB 12.6* 07/12/2015 0505   HCT 39.4 07/12/2015 0505   PLT 225 07/12/2015 0505   MCV 81.6 07/12/2015 0505   LYMPHSABS 1.3 07/12/2015 0505   MONOABS 1.1* 07/12/2015 0505   EOSABS 0.2 07/12/2015 0505   BASOSABS 0.0 07/12/2015 0505    CMP     Component Value Date/Time   NA 141 07/12/2015 0505   K 3.8 07/12/2015 0505   CL 101 07/12/2015 0505   CO2 29 07/12/2015 0505   GLUCOSE 135* 07/12/2015 0505   BUN 33* 07/12/2015 0505   CREATININE 1.66* 07/12/2015 0505   CALCIUM 9.0 07/12/2015 0505   PROT 6.8 07/10/2015 0423   ALBUMIN 3.0* 07/10/2015 0423   AST 16  07/10/2015 0423   ALT 12* 07/10/2015 0423   ALKPHOS 71 07/10/2015 0423   BILITOT 1.1 07/10/2015 0423   GFRNONAA 36* 07/12/2015 0505   GFRAA 42* 07/12/2015 0505    Assessment and Plan  #1-history of orthostasis-I suspect some of this might be chronic per talking with patient-will order blood pressure checks sitting lying and standing daily with a log for review for follow-up-clinically he appears to be stable--he does continue on Lasix and diltiazem again with his cardiac issues and CHF this would be a challenging balancing situation will await further readings and monitor him clinically.  #2 history of leg wounds this is followed closely by wound care-she has been started on Keflex-she also has seen vascular-is thought to be a poor candidate for any aggressive bypass options.  He is on aspirin.  #3 history of chronic diastolic CHF-again he continues on Lasix as well as Cardizem this appears to be relatively stable.  #4 history of multifocal atrial tachycardia and has elevated heart rates in the past this appears to have stabilized significantly he is on Cardizem recent pulses appear to be in the 70s-80s range.  History of aortic stenosis patient will have to be encouraged to avoid salt this was diagnosed on a TTE on 07/03/2015.  #6 history of bipolar disorder this relatively stable on Ativan as well as Seroquel and Zoloft.  #7 diabetes type 2-he is on Tradjenta blood sugars appear to be stable  largely in the mid 100s at this point will monitor.  Number a history of renal insufficiency most recent creatinine 1.6 appears to be stable with this is back in October we will update this.  #9 history of hypothyroidism he is on Synthroid we will need to update a TSH as well.  #10 history of anemia suspect there is an chronic element to this will update a CBC as well.  [id number] note greater than 40 minutes spent assessing patient-discussing his status with nursing staff as well as with  patient and his wife in the room-reviewing his chart extensively-and coordinating and formulating a plan of care for numerous diagnoses-of note greater than 50% of time spent coordinating plan of care

## 2015-12-17 ENCOUNTER — Encounter: Payer: Self-pay | Admitting: Internal Medicine

## 2015-12-17 ENCOUNTER — Non-Acute Institutional Stay (SKILLED_NURSING_FACILITY): Payer: Medicare Other | Admitting: Internal Medicine

## 2015-12-17 DIAGNOSIS — E1121 Type 2 diabetes mellitus with diabetic nephropathy: Secondary | ICD-10-CM

## 2015-12-17 DIAGNOSIS — I471 Supraventricular tachycardia: Secondary | ICD-10-CM

## 2015-12-17 DIAGNOSIS — N183 Chronic kidney disease, stage 3 unspecified: Secondary | ICD-10-CM

## 2015-12-17 DIAGNOSIS — I5032 Chronic diastolic (congestive) heart failure: Secondary | ICD-10-CM | POA: Diagnosis not present

## 2015-12-17 DIAGNOSIS — J441 Chronic obstructive pulmonary disease with (acute) exacerbation: Secondary | ICD-10-CM

## 2015-12-17 NOTE — Progress Notes (Signed)
Patient ID: TRINO SCISM, male   DOB: 1929-01-30, 81 y.o.   MRN: [id number]  t  MRN: [id number] Name: [name]  Sex: male Age: 81 y.o. DOB: 03-25-1929  [place] #: [name] Facility/Room: Level Of Care: SNF Provider: Wille Celeste Emergency Contacts: Extended Emergency Contact Information Primary Emergency Contact: [name],[name] Address: [address]          [place] Phone: [phone] Mobile Phone: [phone] Relation: Spouse Secondary Emergency Contact: [name],[name]  [place] Mobile Phone: [phone] Relation: Son  Code Status:   Allergies: Review of patient's allergies indicates no known allergies.  Chief Complaint  Patient presents with  . Medical Management of Chronic Issues     HPI: Patient is 81 y.o. male wth PVD with extremity ulcers, COPD, GERD, depression, HTN , CHF, CKD, OA diabetes type 2 who is being seen today for routine issues He is also being acutely for was ?? elevated blood sugar Patient is a type II diabetic on Tradjenta 5 mg-spent quite stable with blood sugars largely in the 100s-however one time reading earlier this evening read high-patient did not appear to be unstable or septic or any different from what is has been previously and actually repeat blood sugar showed it normalized in the low 100s-I suspect this is probably just an error from the reading.   .  In regards to other issues he appears to be stable he does have a history of cellulitis of his lower extremities this is followed by wound care a  I do note he had a vascular consult on 10/13/2015 with a history of moderate to severe arterial insufficiency which complicates his lower leg wounds-he was thought not to be a candidate for bypass and does not want one apparently.  In regards to his blood pressure issues he is on diltiazem 360 a day is also on Lasix 40 mg twice a day recent blood pressures  114/71-108/68-126/60when I saw him earlier this month he was thought to have possibly some orthostatic hypotension-I did order  orthostatic blood pressures but I do not see those available at this time-nonetheless this apparently has not really been an issue over the past few weeks per discussion with nursing.    He does have a history of chronic diastolic CHF he is on Lasix as well as Cardizem-this has been relatively stable this is a challenging situation with his history of renal insufficiency most recent creatinine was 1.7 which appears to be relatively baseline.   Per chart review patient does have a history of sepsis with healthcare associated pneumonia- did receive IV antibiotics and apparently this resolved although he certainly remains at risk  He also has a history of bipolar disorder he is on lorazepam as well as Seroquel and Zoloft it appears this has been stable during his stay here.  In regards hypothyroidism he is on Synthroid we will need to update a TSH  Currently patient has no complaints--- does not complaining any fever or chills-or syncopal-type feelings recently.  .  .  .  Marland Kitchen   Past Medical History  Diagnosis Date  . Hyperlipidemia   . COPD (chronic obstructive pulmonary disease) ([place])   . Leg pain   . GERD (gastroesophageal reflux disease)   . Depression   . Dizziness   . Productive cough   . Thyroid disease   . Peripheral vascular disease ([place])   . ED (erectile dysfunction)   . Hypertension  08/17/12 - wife denies  . Walking pneumonia 2000's  . H/O hiatal hernia   . Migraine     "when I was a young boy"  . Arthritis     "hands" (08/28/2014)  . Anxiety   . Hypothyroidism   . Chronic kidney disease (CKD), stage III (moderate)     [name] 08/28/2014    Past Surgical History  Procedure Laterality Date  . Renal artery stent Left   . Kidney stone surgery  1989  . Lower extremity angiogram Left 03-20-2014    Dr. Tinnie Gens  . Tonsillectomy    .  Cataract extraction, bilateral Bilateral   . Blepharoplasty Right   . Abdominal aortagram N/A 03/20/2013    Procedure: ABDOMINAL Maxcine Ham;  Surgeon: [name], MD;  Location[facility] C[facility] LAB;  Service: Cardiovascular;  Laterality: N/A;  . Lower extremity angiogram Left 03/20/2013    Procedure: LOWER EXTREMITY ANGIOGRAM;  Surgeon: [name], MD;  Location: [facility] CATH LAB;  Service: Cardiovascular;  Laterality: Left;      Medication List       This list is accurate as of: 12/17/15 11:59 PM.  Always use your most recent med list.               acetaminophen 325 MG tablet  Commonly known as:  TYLENOL  Take 650 mg by mouth every 6 (six) hours as needed for mild pain, moderate pain, fever or headache.     aspirin EC 81 MG tablet  Take 81 mg by mouth every morning.     carbidopa-levodopa 10-100 MG tablet  Commonly known as:  SINEMET IR  Take 1 tablet by mouth every evening.     cyclobenzaprine 10 MG tablet  Commonly known as:  FLEXERIL  Take 10 mg by mouth 2 (two) times daily.     diltiazem 360 MG 24 hr capsule  Commonly known as:  CARDIZEM CD  Take 1 capsule (360 mg total) by mouth daily.     feeding supplement (ENSURE) Pudg  Take 1 Container by mouth 3 (three) times daily between meals.     fentaNYL 50 MCG/HR  Commonly known as:  DURAGESIC - dosed mcg/hr  Place 1 patch onto the skin every 3 (three) days.     Fluticasone-Salmeterol 250-50 MCG/DOSE Aepb  Commonly known as:  ADVAIR  Inhale 1-2 puffs into the lungs 2 (two) times daily as needed (for wheezing.).     furosemide 40 MG tablet  Commonly known as:  LASIX  Take 1 tablet (40 mg total) by mouth 2 (two) times daily.     gabapentin 300 MG capsule  Commonly known as:  NEURONTIN  Take 1 capsule (300 mg total) by mouth 2 (two) to 3 (three)  times daily.     ipratropium 0.02 % nebulizer solution  Commonly known as:  ATROVENT  Take 500 mcg by nebulization every 6 (six) hours as needed. For shortness of breath      levothyroxine 112 MCG tablet  Commonly known as:  SYNTHROID, LEVOTHROID  Take 112 mcg by mouth every morning.     linagliptin 5 MG Tabs tablet  Commonly known as:  TRADJENTA  Take 1 tablet (5 mg total) by mouth daily.     LORazepam 0.5 MG tablet  Commonly known as:  ATIVAN  Take 0.5 mg by mouth 3 (three) times daily.     MELATIN PO  Take 1 tablet by mouth at bedtime.     Oxycodone HCl 10 MG  Tabs  Take 5 mg by mouth every 4 (four) hours as needed (pain).     potassium chloride SA 20 MEQ tablet  Commonly known as:  K-DUR,KLOR-CON  Take 2 tablets (40 mEq total) by mouth once.     QUEtiapine 25 MG tablet  Commonly known as:  SEROQUEL  Take 1 tablet (25 mg total) by mouth at bedtime. May take 1-2 during the day as needed.     sertraline 100 MG tablet  Commonly known as:  ZOLOFT  Take 100 mg by mouth every evening.     simvastatin 10 MG tablet  Commonly known as:  ZOCOR  Take 10 mg by mouth daily.        Meds ordered this encounter  Medications  . simvastatin (ZOCOR) 10 MG tablet    Sig: Take 10 mg by mouth daily.    Immunization History  Administered Date(s) Administered  . Influenza,inj,Quad PF,36+ Mos 08/30/2014    Social History  Substance Use Topics  . Smoking status: Former Smoker -- 1.00 packs/day for 25 years    Types: Cigarettes    Quit date: 08/17/2012  . Smokeless tobacco: Never Used  . Alcohol Use: 0.0 oz/week    0 Standard drinks or equivalent per week     Comment: 08/28/2014 "last drink was in ~ 2012"    Review of Systems  DATA OBTAINED: from patient, nurse GENERAL:  no fevers, fatigue, appetite changes SKIN: No itching, rash--history of right heel ulcer apparently has resolved per nursing and wound care HEENT: No complaint RESPIRATORY: No cough, wheezing, SOB CARDIAC: No chest pain, palpitations, lower extremity edema  GI: No abdominal pain, No N/V/D or constipation, No heartburn or reflux  GU: No dysuria, frequency or urgency, or  incontinence  MUSCULOSKELETAL: No unrelieved bone/joint pain NEUROLOGIC: No headache, dizziness or recent syncopal or presyncopal episodes PSYCHIATRIC: No overt anxiety or sadness--no recent behaviors that I am aware of  Filed Vitals:   12/17/15 1928  BP: 114/71  Pulse: 80  Temp: 98.1 F (36.7 C)  Resp: 19    Physical Exam  GENERAL APPEARANCE: Alert, conversant, No acute distress sitting comfortably in his wheelchair  SKIN: No diaphoresis rash; leg wrapped HEENT: Unremarkable oropharynx is clear mucous membranes moist RESPIRATORY: Breathing is even, unlabored. Lung sounds are clear   CARDIOVASCULAR: Heart RRR with occasional irregular beats no murmurs, rubs or gallops.-Legs are wrapped bilaterally GASTROINTESTINAL: Abdomen is soft, non-tender, not distended w/ normal bowel sounds.  GENITOURINARY: Bladder non tender, not distended  MUSCULOSKELETAL: No abnormal joints or musculature-I did not attempt to stand him this evening apparently is capable standing but is quite weak NEUROLOGIC: Cranial nerves 2-12 grossly intact. Moves all extremities PSYCHIATRIC: Mood and affect appropriate to situation, no behavioral issues--has some mild cognitive deficits he is pleasant and appropriate  Patient Active Problem List   Diagnosis Date Noted  . Hypotension 11/23/2015  . Candida rash of groin 09/23/2015  . Bipolar disorder ([place]) 07/14/2015  . Aortic stenosis 07/09/2015  . Chronic diastolic CHF (congestive heart failure) ([place]) 07/09/2015  . Sepsis due to pneumonia ([place]) 07/08/2015  . Acute respiratory failure with hypoxia ([place]) 07/02/2015  . Pulmonary edema 07/02/2015  . Leukocytosis 07/02/2015  . DM2 (diabetes mellitus, type 2) ([place]) 07/02/2015  . Chronic osteomyelitis of right foot ([place]) 11/11/2014  . Beta-hemolytic group B streptococcal sepsis ([place]) 11/11/2014  . Bacteremia 11/11/2014  . Atherosclerotic PVD with ulceration ([place]) 10/28/2014  . Hyperlipidemia 10/07/2014  . Obesity  10/07/2014  . Moderate aortic  stenosis 10/07/2014  . Multifocal atrial tachycardia ([place]) 10/06/2014  . Sepsis ([place]) 10/05/2014  . Wound of right leg 08/28/2014  . Foot ulcer ([place]) 01/04/2013  . Cellulitis 01/04/2013  . PAD (peripheral artery disease) ([place]) 01/04/2013  . Leg swelling 10/23/2012  . Atherosclerosis of native arteries of the extremities with ulceration ([place]) 10/23/2012  . Aspiration pneumonia ([place]) 08/17/2012  . Toxic encephalopathy 08/17/2012  . COPD with acute exacerbation ([place]) 08/17/2012  . Sacral decubitus ulcer 08/17/2012  . Chronic pain 08/17/2012  . Anxiety disorder 08/17/2012  . Chronic kidney disease, stage III (moderate) 08/17/2012  . Hypothyroidism 02/21/2008  . DYSLIPIDEMIA 02/21/2008  . ANXIETY DEPRESSION 02/21/2008  . RHEUMATIC FEVER 02/21/2008  . Hypertensive heart disease with CHF ([place]) 02/21/2008  . COPD 02/21/2008  . ESOPHAGEAL MOTILITY DISORDER 02/21/2008  . ESOPHAGEAL DIVERTICULUM 02/21/2008  . SLEEP APNEA 02/21/2008  . ATHEROSCLEROSIS 07/11/2007  . INGUINAL HERNIAS, BILATERAL 07/11/2007  . HERNIA, UMBILICAL [id number]  . CHOLELITHIASIS 07/11/2007  . UNSPECIFIED RENAL SCLEROSIS 07/11/2007  . TUBULOVILLOUS ADENOMA, COLON 11/02/2005  . GERD 11/02/2005  . DIVERTICULOSIS OF COLON 11/02/2005  . MELANOSIS COLI 11/02/2005    Labs 11/24/2015.  Liver function tests within normal limits.  Sodium 138 potassium 3.5 BUN 29 creatinine 1.7.  WBC 10.6 hemoglobin 12.3 platelets 182.  10/07/2015.  Creatinine 1.6.  Over 16 2016.  Sodium 138  BUN 12 creatinine 1.8.  WBC 8.8 hemoglobin 10.3 platelets 214.    CBC    Component Value Date/Time   WBC 14.5* 07/12/2015 0505   RBC 4.83 07/12/2015 0505   HGB 12.6* 07/12/2015 0505   HCT 39.4 07/12/2015 0505   PLT 225 07/12/2015 0505   MCV 81.6 07/12/2015 0505   LYMPHSABS 1.3 07/12/2015 0505   MONOABS 1.1* 07/12/2015 0505   EOSABS 0.2 07/12/2015 0505   BASOSABS 0.0 07/12/2015 0505    CMP      Component Value Date/Time   NA 141 07/12/2015 0505   K 3.8 07/12/2015 0505   CL 101 07/12/2015 0505   CO2 29 07/12/2015 0505   GLUCOSE 135* 07/12/2015 0505   BUN 33* 07/12/2015 0505   CREATININE 1.66* 07/12/2015 0505   CALCIUM 9.0 07/12/2015 0505   PROT 6.8 07/10/2015 0423   ALBUMIN 3.0* 07/10/2015 0423   AST 16 07/10/2015 0423   ALT 12* 07/10/2015 0423   ALKPHOS 71 07/10/2015 0423   BILITOT 1.1 07/10/2015 0423   GFRNONAA 36* 07/12/2015 0505   GFRAA 42* 07/12/2015 0505    Assessment and Plan  #History diabetes type 2-continues on Tradjenta this appears to be stable I suspect the reading this evening was more of a machine abnormality at this point will monitor clinically he appears to be at baseline Will update a hemoglobin A1c   #2 history of leg wounds this is followed closely by wound carehe also has seen vascular-is thought to be a poor candidate for any aggressive bypass options.  He is on aspirin.  #3 history of chronic diastolic CHF-again he continues on Lasix as well as Cardizem this appears to be relatively stable.--We will update a metabolic panel-she is on potassium supplementation  #4 history of multifocal atrial tachycardia and has elevated heart rates in the past this appears to have stabilized significantly he is on Cardizem recent pulses appear to be in the 70s-90s range.  History of aortic stenosis patient will have to be encouraged to avoid salt this was diagnosed on a TTE on 07/03/2015.  #6 history of bipolar disorder this relatively stable  on Ativan as well as Seroquel and Zoloft.   . #7 Number a history of renal insufficiency most recent creatinine 1.7 appears to be stable-- we will update this.  #8 history of hypothyroidism he is on Synthroid we will need to update a TSH as well.  #9 history of anemia suspect there is an chronic element--hemoglobin of 12.3 earlier this month show stability  #10 history of chronic pain he is on OxyIR 5 mg every 6  hours when necessary as well as Tylenol when necessary and Duragesic patch 50 g-at this point appears to be controlled and do not note that this is been in issue recently.  #11-history COPD this appears stable he is on Advair twice a day as well as nebulizers when necessary.  #12 history hyperlipidemia he is on a statin recent liver function tests were unremarkable will update a lipid panel.  #13 history of Parkinsonian is some features this appears to be stabilized he is on Sinemet.  History of anxiety he continues on Ativan this apparently stable as well.  #15 history of neuropathy I suspect this is diabetic related he is on Neurontin 3 times a day appears to be tolerating this well.      [id number] note greater than 35 minutes spent assessing patient-discussing his status with nursing staff as well as with patient-reviewing his chart-coordinating and formulating a plan of care for numerous diagnoses-of note greater than 50% of time spent coordinating plan of care

## 2015-12-19 ENCOUNTER — Encounter: Payer: Self-pay | Admitting: Internal Medicine

## 2015-12-22 ENCOUNTER — Other Ambulatory Visit: Payer: Self-pay | Admitting: *Deleted

## 2015-12-22 ENCOUNTER — Non-Acute Institutional Stay (SKILLED_NURSING_FACILITY): Payer: Medicare Other | Admitting: Internal Medicine

## 2015-12-22 ENCOUNTER — Encounter: Payer: Self-pay | Admitting: Internal Medicine

## 2015-12-22 DIAGNOSIS — R600 Localized edema: Secondary | ICD-10-CM

## 2015-12-22 MED ORDER — FENTANYL 50 MCG/HR TD PT72: 50.0000 ug | MEDICATED_PATCH | TRANSDERMAL | Status: DC

## 2015-12-22 NOTE — Progress Notes (Signed)
MRN: [id number] Name: [name]  Sex: male Age: 82 y.o. DOB: 02/09/1929  [place] #: [name] Facility/Room: Level Of Care: SNF Provider: Inocencio Homes D Emergency Contacts: Extended Emergency Contact Information Primary Emergency Contact: [name],[name] Address: [address]          [place] Phone: [phone] Mobile Phone: [phone] Relation: Spouse Secondary Emergency Contact: [name],[name]  [place] Mobile Phone: [phone] Relation: Son  Code Status:   Allergies: Review of patient's allergies indicates no known allergies.  Chief Complaint  Patient presents with  . Acute Visit    HPI: Patient is 82 y.o. male with COPD, HLD, PVD, HTN, CKD3, depression who is being seen for increase BLE edema.From dietary pt has had about 20 pound weight gain over past month. Pt denies SOB. Pt is known not to elevate his legs, pt is known to take his leg wraps off even though he understands the repercussions. Pt also c/o frequency and urgency, no fever, nausea, vomiting or MS changes  Past Medical History  Diagnosis Date  . Hyperlipidemia   . COPD (chronic obstructive pulmonary disease) ([place])   . Leg pain   . GERD (gastroesophageal reflux disease)   . Depression   . Dizziness   . Productive cough   . Thyroid disease   . Peripheral vascular disease ([place])   . ED (erectile dysfunction)   . Hypertension     08/17/12 - wife denies  . Walking pneumonia 2000's  . H/O hiatal hernia   . Migraine     "when I was a young boy"  . Arthritis     "hands" (08/28/2014)  . Anxiety   . Hypothyroidism   . Chronic kidney disease (CKD), stage III (moderate)     [name] 08/28/2014    Past Surgical History  Procedure Laterality Date  . Renal artery stent Left   . Kidney stone surgery  1989  . Lower extremity angiogram Left 03-20-2014    Dr. Tinnie Gens  . Tonsillectomy    . Cataract extraction, bilateral Bilateral   .  Blepharoplasty Right   . Abdominal aortagram N/A 03/20/2013    Procedure: ABDOMINAL Maxcine Ham;  Surgeon: [name], MD;  Location: [facility] CATH LAB;  Service: Cardiovascular;  Laterality: N/A;  . Lower extremity angiogram Left 03/20/2013    Procedure: LOWER EXTREMITY ANGIOGRAM;  Surgeon: [name], MD;  Location: [facility] CATH LAB;  Service: Cardiovascular;  Laterality: Left;      Medication List       This list is accurate as of: 12/22/15 11:59 PM.  Always use your most recent med list.               acetaminophen 325 MG tablet  Commonly known as:  TYLENOL  Take 650 mg by mouth every 6 (six) hours as needed for mild pain, moderate pain, fever or headache.     aspirin EC 81 MG tablet  Take 81 mg by mouth every morning.     carbidopa-levodopa 10-100 MG tablet  Commonly known as:  SINEMET IR  Take 1 tablet by mouth every evening.     cyclobenzaprine 10 MG tablet  Commonly known as:  FLEXERIL  Take 10 mg by mouth 2 (two) times daily.     diltiazem 360 MG 24 hr capsule  Commonly known as:  CARDIZEM CD  Take 1 capsule (360 mg total) by mouth daily.     feeding supplement (ENSURE) Pudg  Take 1  Container by mouth 3 (three) times daily between meals.     fentaNYL 50 MCG/HR  Commonly known as:  DURAGESIC - dosed mcg/hr  Place 1 patch (50 mcg total) onto the skin every 3 (three) days.     Fluticasone-Salmeterol 250-50 MCG/DOSE Aepb  Commonly known as:  ADVAIR  Inhale 1-2 puffs into the lungs 2 (two) times daily as needed (for wheezing.).     furosemide 40 MG tablet  Commonly known as:  LASIX  Take 1 tablet (40 mg total) by mouth 2 (two) times daily.     gabapentin 300 MG capsule  Commonly known as:  NEURONTIN  Take 1 capsule (300 mg total) by mouth 2 (two) to 3 (three)  times daily.     ipratropium 0.02 % nebulizer solution  Commonly known as:  ATROVENT  Take 500 mcg by nebulization every 6 (six) hours as needed. For shortness of breath     levothyroxine 112 MCG tablet   Commonly known as:  SYNTHROID, LEVOTHROID  Take 112 mcg by mouth every morning.     linagliptin 5 MG Tabs tablet  Commonly known as:  TRADJENTA  Take 1 tablet (5 mg total) by mouth daily.     LORazepam 0.5 MG tablet  Commonly known as:  ATIVAN  Take 0.5 mg by mouth 3 (three) times daily.     MELATIN PO  Take 1 tablet by mouth at bedtime.     Oxycodone HCl 10 MG Tabs  Take 5 mg by mouth every 4 (four) hours as needed (pain).     potassium chloride SA 20 MEQ tablet  Commonly known as:  K-DUR,KLOR-CON  Take 2 tablets (40 mEq total) by mouth once.     QUEtiapine 25 MG tablet  Commonly known as:  SEROQUEL  Take 1 tablet (25 mg total) by mouth at bedtime. May take 1-2 during the day as needed.     sertraline 100 MG tablet  Commonly known as:  ZOLOFT  Take 100 mg by mouth every evening.     simvastatin 10 MG tablet  Commonly known as:  ZOCOR  Take 10 mg by mouth daily.        No orders of the defined types were placed in this encounter.    Immunization History  Administered Date(s) Administered  . Influenza,inj,Quad PF,36+ Mos 08/30/2014    Social History  Substance Use Topics  . Smoking status: Former Smoker -- 1.00 packs/day for 25 years    Types: Cigarettes    Quit date: 08/17/2012  . Smokeless tobacco: Never Used  . Alcohol Use: 0.0 oz/week    0 Standard drinks or equivalent per week     Comment: 08/28/2014 "last drink was in ~ 2012"    Review of Systems  DATA OBTAINED: from patient, nurse,dietary GENERAL:  no fevers, fatigue, good appetite  SKIN: No itching, rash HEENT: No complaint RESPIRATORY: No cough, wheezing, SOB CARDIAC: No chest pain, palpitations,chronic  lower extremity edema , worse GI: No abdominal pain, No N/V/D or constipation, No heartburn or reflux  GU: No dysuria, +frequency and urgency, no incontinence  MUSCULOSKELETAL: No unrelieved bone/joint pain NEUROLOGIC: No headache, dizziness  PSYCHIATRIC: No overt anxiety or sadness  Filed  Vitals:   12/22/15 2104  BP: 119/72  Pulse: 82  Temp: 98.2 F (36.8 C)  Resp: 18    Physical Exam  GENERAL APPEARANCE: Alert, conversant, No acute distress  SKIN: No diaphoresis rash HEENT: Unremarkable RESPIRATORY: Breathing is even, unlabored. Lung sounds are clear  CARDIOVASCULAR: Heart RRR no murmurs, rubs or gallops; B legs wrapped today GASTROINTESTINAL: Abdomen is soft, non-tender, not distended w/ normal bowel sounds.  GENITOURINARY: Bladder non tender, not distended  MUSCULOSKELETAL: No abnormal joints or musculature NEUROLOGIC: Cranial nerves 2-12 grossly intact. Moves all extremities PSYCHIATRIC: Mood and affect appropriate to situation, no behavioral issues  Patient Active Problem List   Diagnosis Date Noted  . Hypotension 11/23/2015  . Candida rash of groin 09/23/2015  . Bipolar disorder ([place]) 07/14/2015  . Aortic stenosis 07/09/2015  . Chronic diastolic CHF (congestive heart failure) ([place]) 07/09/2015  . Sepsis due to pneumonia ([place]) 07/08/2015  . Acute respiratory failure with hypoxia ([place]) 07/02/2015  . Pulmonary edema 07/02/2015  . Leukocytosis 07/02/2015  . DM2 (diabetes mellitus, type 2) ([place]) 07/02/2015  . Chronic osteomyelitis of right foot ([place]) 11/11/2014  . Beta-hemolytic group B streptococcal sepsis ([place]) 11/11/2014  . Bacteremia 11/11/2014  . Atherosclerotic PVD with ulceration ([place]) 10/28/2014  . Hyperlipidemia 10/07/2014  . Obesity 10/07/2014  . Moderate aortic stenosis 10/07/2014  . Multifocal atrial tachycardia ([place]) 10/06/2014  . Sepsis ([place]) 10/05/2014  . Wound of right leg 08/28/2014  . Foot ulcer ([place]) 01/04/2013  . Cellulitis 01/04/2013  . PAD (peripheral artery disease) ([place]) 01/04/2013  . Bilateral lower extremity edema 10/23/2012  . Atherosclerosis of native arteries of the extremities with ulceration ([place]) 10/23/2012  . Aspiration pneumonia ([place]) 08/17/2012  . Toxic encephalopathy 08/17/2012  . COPD with acute exacerbation  ([place]) 08/17/2012  . Sacral decubitus ulcer 08/17/2012  . Chronic pain 08/17/2012  . Anxiety disorder 08/17/2012  . Chronic kidney disease, stage III (moderate) 08/17/2012  . Hypothyroidism 02/21/2008  . DYSLIPIDEMIA 02/21/2008  . ANXIETY DEPRESSION 02/21/2008  . RHEUMATIC FEVER 02/21/2008  . Hypertensive heart disease with CHF ([place]) 02/21/2008  . COPD 02/21/2008  . ESOPHAGEAL MOTILITY DISORDER 02/21/2008  . ESOPHAGEAL DIVERTICULUM 02/21/2008  . SLEEP APNEA 02/21/2008  . ATHEROSCLEROSIS 07/11/2007  . INGUINAL HERNIAS, BILATERAL 07/11/2007  . HERNIA, UMBILICAL [id number]  . CHOLELITHIASIS 07/11/2007  . UNSPECIFIED RENAL SCLEROSIS 07/11/2007  . TUBULOVILLOUS ADENOMA, COLON 11/02/2005  . GERD 11/02/2005  . DIVERTICULOSIS OF COLON 11/02/2005  . MELANOSIS COLI 11/02/2005    CBC    Component Value Date/Time   WBC 14.5* 07/12/2015 0505   RBC 4.83 07/12/2015 0505   HGB 12.6* 07/12/2015 0505   HCT 39.4 07/12/2015 0505   PLT 225 07/12/2015 0505   MCV 81.6 07/12/2015 0505   LYMPHSABS 1.3 07/12/2015 0505   MONOABS 1.1* 07/12/2015 0505   EOSABS 0.2 07/12/2015 0505   BASOSABS 0.0 07/12/2015 0505    CMP     Component Value Date/Time   NA 141 07/12/2015 0505   K 3.8 07/12/2015 0505   CL 101 07/12/2015 0505   CO2 29 07/12/2015 0505   GLUCOSE 135* 07/12/2015 0505   BUN 33* 07/12/2015 0505   CREATININE 1.66* 07/12/2015 0505   CALCIUM 9.0 07/12/2015 0505   PROT 6.8 07/10/2015 0423   ALBUMIN 3.0* 07/10/2015 0423   AST 16 07/10/2015 0423   ALT 12* 07/10/2015 0423   ALKPHOS 71 07/10/2015 0423   BILITOT 1.1 07/10/2015 0423   GFRNONAA 36* 07/12/2015 0505   GFRAA 42* 07/12/2015 0505    Assessment and Plan  Bilateral lower extremity edema With significant weight gain. Will increase lasix for 40 mg BID to 80 mg in am and 40 mg q aft. Pt has had trouble with hypokalemia, will inc K+ also; obtain BMP next week. If BUN/CR tolerate it may  inc lasix to 80 BID. Discussed with pt  importance of keeping feet elevated.  FREQUENCY - will order U/A with C and Redgie Grayer, Noah Delaine, MD

## 2015-12-22 NOTE — Telephone Encounter (Signed)
[facility] 

## 2015-12-27 NOTE — Assessment & Plan Note (Addendum)
With significant weight gain. Will increase lasix for 40 mg BID to 80 mg in am and 40 mg q aft. Pt has had trouble with hypokalemia, will inc K+ also; obtain BMP next week. If BUN/CR tolerate it may inc lasix to 80 BID. Discussed with pt importance of keeping feet elevated.

## 2016-01-04 ENCOUNTER — Encounter: Payer: Self-pay | Admitting: Internal Medicine

## 2016-01-04 ENCOUNTER — Non-Acute Institutional Stay (SKILLED_NURSING_FACILITY): Payer: Medicare Other | Admitting: Internal Medicine

## 2016-01-04 DIAGNOSIS — J441 Chronic obstructive pulmonary disease with (acute) exacerbation: Secondary | ICD-10-CM

## 2016-01-04 DIAGNOSIS — I5032 Chronic diastolic (congestive) heart failure: Secondary | ICD-10-CM | POA: Diagnosis not present

## 2016-01-04 DIAGNOSIS — N183 Chronic kidney disease, stage 3 unspecified: Secondary | ICD-10-CM

## 2016-01-04 DIAGNOSIS — E038 Other specified hypothyroidism: Secondary | ICD-10-CM | POA: Diagnosis not present

## 2016-01-04 DIAGNOSIS — J189 Pneumonia, unspecified organism: Secondary | ICD-10-CM

## 2016-01-04 NOTE — Progress Notes (Signed)
Patient ID: OSBY MICHAELSON, male   DOB: Dec 19, 1929, 82 y.o.   MRN: [id number]   t  MRN: [id number] Name: [name]  Sex: male Age: 82 y.o. DOB: 1929/08/01  [facility]: [name] Facility/Room: Level Of Care: SNF Provider: Wille Celeste Emergency Contacts: Extended Emergency Contact Information Primary Emergency Contact: [name],[name] Address: [address]          [place] Phone: [phone] Mobile Phone: [phone] Relation: Spouse Secondary Emergency Contact: [name],[name]  [place] Mobile Phone: [phone] Relation: Son  Code Status:   Allergies: Review of patient's allergies indicates no known allergies.  Chief Complaint  Patient presents with  . Acute Visit   secondary to suspected pneumonia-  HPI: Patient is 82 y.o. male wth PVD with extremity ulcers, COPD, GERD, depression, HTN , CHF, CKD, OA diabetes type 2 who apparently was found to have increased congestion over the week-chest x-ray was ordered which showed left basilar infiltrates with small associated per pneumatic effusion-also mild right lung base infiltrates-it showed mild cardiac enlargement without evidence of CHF or volume overload On-call provider did start him on Levaquin 750 mg every other day for 10 days as well asa probiotic-- He does have a history of sepsis with healthcare associated pneumonia.  Per nursing he appears to be stable today vital signs are stable except for a low-grade temperature of 100.1 he does not really complain of shortness of breath he does have somewhat of a nonproductive cough and has some continued chest congestion.  He is on Advair as well as Atrovent nebulizers as needed every 6 hours we will make these routine.  Today's currently sitting in his room comfortably.  Patient also was noted to have some increased weight gain earlier this month Dr. Sheppard Coil did increase his Lasix up to 80 mg in the morning 40  mg later in the day His weight appears to have stabilized it is 208 today and actually was 213 when Lasix was increased-he does have his legs wrapped bilaterally today  .   In regards to other issues he appears to be stable he does have a history of cellulitis of his lower extremities this is followed by wound care   I do note he had a vascular consult on 10/13/2015 with a history of moderate to severe arterial insufficiency which complicates his lower leg wounds-he was thought not to be a candidate for bypass and does not want one apparently.  In regards to his blood pressure issues he is on diltiazem 360 a day is also on Lasix now 80 mg in a.m. 40 mg later in the day.  Blood pressures appear to be stable most recently 107/70-110/75-125/77-I do see [id number] systolically for one reading but this appears to be quite abnormal for him at this point will monitor-I one point was thought he was having possibly orthostatic symptoms this has been stable now for a significant amount of time with no reoccurrence at this point will monitor        He does have a history of chronic diastolic CHF he is on Lasix As noted above his Lasix has been increased to 80 mg in the morning 40 mg later in the day his renal function appears to have tolerated this reasonably well creatinine stable at 1.4 on lab done on January 11 potassium was 3.7 he is on potassium supplementation as well-will update this to ensure stability.  Again  yesterday's chest x-ray was reassuring  in regards to the CHF     He also has a history of bipolar disorder he is on lorazepam as well as Seroquel and Zoloft it appears this has been stable during his stay here.  In regards hypothyroidism he is on Synthroid we will need to update a TSH   .  .  .  .  Marland Kitchen   Past Medical History  Diagnosis Date  . Hyperlipidemia   . COPD (chronic obstructive pulmonary disease) ([place])   . Leg pain   . GERD (gastroesophageal reflux disease)   .  Depression   . Dizziness   . Productive cough   . Thyroid disease   . Peripheral vascular disease ([place])   . ED (erectile dysfunction)   . Hypertension     08/17/12 - wife denies  . Walking pneumonia 2000's  . H/O hiatal hernia   . Migraine     "when I was a young boy"  . Arthritis     "hands" (08/28/2014)  . Anxiety   . Hypothyroidism   . Chronic kidney disease (CKD), stage III (moderate)     [name] 08/28/2014    Past Surgical History  Procedure Laterality Date  . Renal artery stent Left   . Kidney stone surgery  1989  . Lower extremity angiogram Left 03-20-2014    Dr. Tinnie Gens  . Tonsillectomy    . Cataract extraction, bilateral Bilateral   . Blepharoplasty Right   . Abdominal aortagram N/A 03/20/2013    Procedure: ABDOMINAL Maxcine Ham;  Surgeon: [name], MD;  Location: [facility] CATH LAB;  Service: Cardiovascular;  Laterality: N/A;  . Lower extremity angiogram Left 03/20/2013    Procedure: LOWER EXTREMITY ANGIOGRAM;  Surgeon: [name], MD;  Location: [facility] CATH LAB;  Service: Cardiovascular;  Laterality: Left;      Medication List       This list is accurate as of: 01/04/16  2:07 PM.  Always use your most recent med list.               acetaminophen 325 MG tablet  Commonly known as:  TYLENOL  Take 650 mg by mouth every 6 (six) hours as needed for mild pain, moderate pain, fever or headache.     aspirin EC 81 MG tablet  Take 81 mg by mouth every morning.     carbidopa-levodopa 10-100 MG tablet  Commonly known as:  SINEMET IR  Take 1 tablet by mouth every evening.     cyclobenzaprine 10 MG tablet  Commonly known as:  FLEXERIL  Take 10 mg by mouth 2 (two) times daily.     diltiazem 360 MG 24 hr capsule  Commonly known as:  CARDIZEM CD  Take 1 capsule (360 mg total) by mouth daily.     feeding supplement (ENSURE) Pudg  Take 1 Container by mouth 3 (three) times daily between meals.     fentaNYL 50 MCG/HR  Commonly known as:  DURAGESIC - dosed mcg/hr   Place 1 patch (50 mcg total) onto the skin every 3 (three) days.     Fluticasone-Salmeterol 250-50 MCG/DOSE Aepb  Commonly known as:  ADVAIR  Inhale 1-2 puffs into the lungs 2 (two) times daily as needed (for wheezing.).     furosemide 40 MG tablet  Commonly known as:  LASIX  Take 1 tablet (40 mg total) by mouth 2 (two) times daily.     gabapentin 300 MG capsule  Commonly known as:  NEURONTIN  Take 1 capsule (  300 mg total) by mouth 2 (two) to 3 (three)  times daily.     ipratropium 0.02 % nebulizer solution  Commonly known as:  ATROVENT  Take 500 mcg by nebulization every 6 (six) hours as needed. For shortness of breath     levothyroxine 112 MCG tablet  Commonly known as:  SYNTHROID, LEVOTHROID  Take 112 mcg by mouth every morning.     linagliptin 5 MG Tabs tablet  Commonly known as:  TRADJENTA  Take 1 tablet (5 mg total) by mouth daily.     LORazepam 0.5 MG tablet  Commonly known as:  ATIVAN  Take 0.5 mg by mouth 3 (three) times daily.     MELATIN PO  Take 1 tablet by mouth at bedtime.     Oxycodone HCl 10 MG Tabs  Take 5 mg by mouth every 4 (four) hours as needed (pain).     potassium chloride SA 20 MEQ tablet  Commonly known as:  K-DUR,KLOR-CON  Take 2 tablets (40 mEq total) by mouth once.     QUEtiapine 25 MG tablet  Commonly known as:  SEROQUEL  Take 1 tablet (25 mg total) by mouth at bedtime. May take 1-2 during the day as needed.     sertraline 100 MG tablet  Commonly known as:  ZOLOFT  Take 100 mg by mouth every evening.     simvastatin 10 MG tablet  Commonly known as:  ZOCOR  Take 10 mg by mouth daily.       of note he is now on Lasix 80 mg in the morning and 40 mg later in the day and potassium 20 mEq 2 times a day    Immunization History  Administered Date(s) Administered  . Influenza,inj,Quad PF,36+ Mos 08/30/2014    Social History  Substance Use Topics  . Smoking status: Former Smoker -- 1.00 packs/day for 25 years    Types: Cigarettes     Quit date: 08/17/2012  . Smokeless tobacco: Never Used  . Alcohol Use: 0.0 oz/week    0 Standard drinks or equivalent per week     Comment: 08/28/2014 "last drink was in ~ 2012"    Review of Systems  DATA OBTAINED: from patient, nurse GENERAL:  no fevers, fatigue, appetite changes SKIN: No itching, rash--lower extremity wounds have been followed by wound care HEENT: No complaint RESPIRATORY: Has a cough-chest x-ray suspicious for pneumonia-does not complain of  shortness of breath however beyond baseline CARDIAC: No chest pain, palpitations, lower extremity edema  GI: No abdominal pain, No N/V/D or constipation, No heartburn or reflux  GU: No dysuria, frequency or urgency, or incontinence  MUSCULOSKELETAL: No unrelieved bone/joint pain NEUROLOGIC: No headache, dizziness or recent syncopal or presyncopal episodes PSYCHIATRIC: No overt anxiety or sadness--no recent behaviors that I am aware of  Filed Vitals:   01/04/16 1354  BP: 107/70  Pulse: 90  Temp: 100.1 F (37.8 C)  Resp: 20    Physical Exam  GENERAL APPEARANCE: Alert, conversant, No acute distress sitting comfortably in his wheelchair  SKIN: No diaphoresis rash; legs wrapped HEENT: Unremarkable oropharynx is clear mucous membranes moist RESPIRATORY: Breathing is even, unlabored. Has some diffuse congestion most noticeable on expiration left lung fields  CARDIOVASCULAR: Heart RRR with occasional irregular beats no murmurs, rubs or gallops.-Legs are wrapped bilaterally GASTROINTESTINAL: Abdomen is soft, non-tender, not distended w/ normal bowel sounds.  GENITOURINARY: Bladder non tender, not distended  MUSCULOSKELETAL: No abnormal joints or musculature-I did not attempt to stand him this afternoon  apparently is capable standing but is quite weak NEUROLOGIC: Cranial nerves 2-12 grossly intact. Moves all extremities PSYCHIATRIC: Mood and affect appropriate to situation, no behavioral issues--has some mild cognitive  deficits he is pleasant and appropriate  Patient Active Problem List   Diagnosis Date Noted  . Hypotension 11/23/2015  . Candida rash of groin 09/23/2015  . Bipolar disorder ([place]) 07/14/2015  . Aortic stenosis 07/09/2015  . Chronic diastolic CHF (congestive heart failure) ([place]) 07/09/2015  . Sepsis due to pneumonia ([place]) 07/08/2015  . Acute respiratory failure with hypoxia ([place]) 07/02/2015  . Pulmonary edema 07/02/2015  . Leukocytosis 07/02/2015  . DM2 (diabetes mellitus, type 2) ([place]) 07/02/2015  . Chronic osteomyelitis of right foot ([place]) 11/11/2014  . Beta-hemolytic group B streptococcal sepsis ([place]) 11/11/2014  . Bacteremia 11/11/2014  . Atherosclerotic PVD with ulceration ([place]) 10/28/2014  . Hyperlipidemia 10/07/2014  . Obesity 10/07/2014  . Moderate aortic stenosis 10/07/2014  . Multifocal atrial tachycardia ([place]) 10/06/2014  . Sepsis ([place]) 10/05/2014  . Wound of right leg 08/28/2014  . Foot ulcer ([place]) 01/04/2013  . Cellulitis 01/04/2013  . PAD (peripheral artery disease) ([place]) 01/04/2013  . Bilateral lower extremity edema 10/23/2012  . Atherosclerosis of native arteries of the extremities with ulceration ([place]) 10/23/2012  . Aspiration pneumonia ([place]) 08/17/2012  . Toxic encephalopathy 08/17/2012  . COPD with acute exacerbation ([place]) 08/17/2012  . Sacral decubitus ulcer 08/17/2012  . Chronic pain 08/17/2012  . Anxiety disorder 08/17/2012  . Chronic kidney disease, stage III (moderate) 08/17/2012  . Hypothyroidism 02/21/2008  . DYSLIPIDEMIA 02/21/2008  . ANXIETY DEPRESSION 02/21/2008  . RHEUMATIC FEVER 02/21/2008  . Hypertensive heart disease with CHF ([place]) 02/21/2008  . COPD 02/21/2008  . ESOPHAGEAL MOTILITY DISORDER 02/21/2008  . ESOPHAGEAL DIVERTICULUM 02/21/2008  . SLEEP APNEA 02/21/2008  . ATHEROSCLEROSIS 07/11/2007  . INGUINAL HERNIAS, BILATERAL 07/11/2007  . HERNIA, UMBILICAL [id number]  . CHOLELITHIASIS 07/11/2007  . UNSPECIFIED RENAL SCLEROSIS  07/11/2007  . TUBULOVILLOUS ADENOMA, COLON 11/02/2005  . GERD 11/02/2005  . DIVERTICULOSIS OF COLON 11/02/2005  . MELANOSIS COLI 11/02/2005    Labs  12/30/2015.  Sodium 138 potassium 3.7 BUN 19 creatinine 1.4.  12/18/2015.  HDL 36--LDL 38--triglycerides 89--cholesterol 92 11/24/2015.  Liver function tests within normal limits.  Sodium 138 potassium 3.5 BUN 29 creatinine 1.7.  WBC 10.6 hemoglobin 12.3 platelets 182.  10/07/2015.  Creatinine 1.6.  Over 16 2016.  Sodium 138  BUN 12 creatinine 1.8.  WBC 8.8 hemoglobin 10.3 platelets 214.    CBC    Component Value Date/Time   WBC 14.5* 07/12/2015 0505   RBC 4.83 07/12/2015 0505   HGB 12.6* 07/12/2015 0505   HCT 39.4 07/12/2015 0505   PLT 225 07/12/2015 0505   MCV 81.6 07/12/2015 0505   LYMPHSABS 1.3 07/12/2015 0505   MONOABS 1.1* 07/12/2015 0505   EOSABS 0.2 07/12/2015 0505   BASOSABS 0.0 07/12/2015 0505    CMP     Component Value Date/Time   NA 141 07/12/2015 0505   K 3.8 07/12/2015 0505   CL 101 07/12/2015 0505   CO2 29 07/12/2015 0505   GLUCOSE 135* 07/12/2015 0505   BUN 33* 07/12/2015 0505   CREATININE 1.66* 07/12/2015 0505   CALCIUM 9.0 07/12/2015 0505   PROT 6.8 07/10/2015 0423   ALBUMIN 3.0* 07/10/2015 0423   AST 16 07/10/2015 0423   ALT 12* 07/10/2015 0423   ALKPHOS 71 07/10/2015 0423   BILITOT 1.1 07/10/2015 0423   GFRNONAA 36* 07/12/2015 0505   GFRAA 42* 07/12/2015  0505    Assessment and Plan #1 pneumonia-chest x-ray as noted above he has been started on Levaquin for a 10 day course-he does have Atrovent nebulizers every 6 hours when necessary we'll make these routine for 72 hours secondary to what appears to be some increased chest congestion-also will start Mucinex 600 mg twice a day for 7 days to help with expectoration he does have somewhat of a dry cough-monitor with vital signs pulse ox every shift for now Also will update a CBC with differential tomorrow    #2 history of leg  wounds this is followed closely by wound carehe also has seen vascular-is thought to be a poor candidate for any aggressive bypass options.  He is on aspirin.  #3 history of chronic diastolic CHF-again he continues on Lasix as well as Cardizem Latencies has been increased to 80 mg in the morning continues on 40 mg later in the day- also is on potassium supplementation-will need to check a metabolic panel tomorrow to ensure stability of renal function and electrolytes so far this appears to be stable on lab done January 11  #4 history of multifocal atrial tachycardia and has elevated heart rates in the past this appears to have stabilized significantly he is on Cardizem recent pulses appear to be in the 80s-90s range.  History of aortic stenosis patient will have to be encouraged to avoid salt this was diagnosed on a TTE on 07/03/2015.  #6 history of bipolar disorder this relatively stable on Ativan as well as Seroquel and Zoloft.   . #7 Number a history of renal insufficiency most recent creatinine 1.4 appears to be stable-despite the increased dose of Lasix-will update this  #8 history of hypothyroidism he is on Synthroid we will need to update a TSH as well.  #9 history of anemia suspect there is an chronic element--hemoglobin of 12.3 last month show stability--again we will update a CBC  #10 history of chronic pain he is on OxyIR 5 mg every 6 hours when necessary as well as Tylenol when necessary and Duragesic patch 50 g-at this point appears to be controlled and do not note that this is been in issue recently.  #11-history COPD  As noted above he is being treated for pneumonia will make nebulizers routine for 72 hours as well as add Mucinex-he continues on Advair as well  #12 history hyperlipidemia he is on a statin recent liver function tests were unremarkable--lipid panel showed LDL 38 cholesterol 92.  #13 history of Parkinsonian is some features this appears to be stabilized he  is on Sinemet. #14 History of anxiety he continues on Ativan this apparently stable as well.  #15 history of neuropathy I suspect this is diabetic related he is on Neurontin 3 times a day appears to be tolerating this well.   Again most acute issues pneumonia-he appears to be stable at this time we'll continue antibiotic Levaquin for total 10 day course-also will add nebulizers routinely for 72 hours and Mucinex for 1 week-monitor closely with vital signs pulse ox.  He also has had his Lasix recently increased we will have to make sure we check his electrolytes tomorrow to ensure stability  [id number]-of note greater than 40 minutes spent assessing patient-discussing his status with nursing staff-reviewing his chart and labs-and coordinating and formulating a plan of care for numerous diagnoses-of note greater than 50% of time spent coordinating plan of care      [id number]-of note greater than 35 minutes spent assessing patient-discussing his  status with nursing staff as well as with patient-reviewing his chart-coordinating and formulating a plan of care for numerous diagnoses-of note greater than 50% of time spent coordinating plan of care

## 2016-01-15 ENCOUNTER — Encounter (HOSPITAL_COMMUNITY): Payer: Self-pay | Admitting: Emergency Medicine

## 2016-01-15 ENCOUNTER — Non-Acute Institutional Stay (SKILLED_NURSING_FACILITY): Payer: Medicare Other | Admitting: Internal Medicine

## 2016-01-15 ENCOUNTER — Inpatient Hospital Stay (HOSPITAL_COMMUNITY)
Admission: EM | Admit: 2016-01-15 | Discharge: 2016-01-19 | DRG: 871 | Disposition: A | Discharge status: Skilled Nursing Facility | Payer: Medicare Other | Attending: Internal Medicine | Admitting: Internal Medicine

## 2016-01-15 ENCOUNTER — Emergency Department (HOSPITAL_COMMUNITY): Payer: Medicare Other

## 2016-01-15 DIAGNOSIS — A419 Sepsis, unspecified organism: Secondary | ICD-10-CM | POA: Diagnosis not present

## 2016-01-15 DIAGNOSIS — L97511 Non-pressure chronic ulcer of other part of right foot limited to breakdown of skin: Secondary | ICD-10-CM | POA: Diagnosis present

## 2016-01-15 DIAGNOSIS — D638 Anemia in other chronic diseases classified elsewhere: Secondary | ICD-10-CM | POA: Diagnosis present

## 2016-01-15 DIAGNOSIS — I739 Peripheral vascular disease, unspecified: Secondary | ICD-10-CM | POA: Diagnosis present

## 2016-01-15 DIAGNOSIS — E039 Hypothyroidism, unspecified: Secondary | ICD-10-CM | POA: Diagnosis present

## 2016-01-15 DIAGNOSIS — K219 Gastro-esophageal reflux disease without esophagitis: Secondary | ICD-10-CM | POA: Diagnosis present

## 2016-01-15 DIAGNOSIS — Z7984 Long term (current) use of oral hypoglycemic drugs: Secondary | ICD-10-CM

## 2016-01-15 DIAGNOSIS — Z87442 Personal history of urinary calculi: Secondary | ICD-10-CM

## 2016-01-15 DIAGNOSIS — J440 Chronic obstructive pulmonary disease with acute lower respiratory infection: Secondary | ICD-10-CM | POA: Diagnosis present

## 2016-01-15 DIAGNOSIS — Z79899 Other long term (current) drug therapy: Secondary | ICD-10-CM

## 2016-01-15 DIAGNOSIS — J441 Chronic obstructive pulmonary disease with (acute) exacerbation: Secondary | ICD-10-CM | POA: Diagnosis present

## 2016-01-15 DIAGNOSIS — Z79891 Long term (current) use of opiate analgesic: Secondary | ICD-10-CM

## 2016-01-15 DIAGNOSIS — E1122 Type 2 diabetes mellitus with diabetic chronic kidney disease: Secondary | ICD-10-CM | POA: Diagnosis present

## 2016-01-15 DIAGNOSIS — Y95 Nosocomial condition: Secondary | ICD-10-CM | POA: Diagnosis present

## 2016-01-15 DIAGNOSIS — I7025 Atherosclerosis of native arteries of other extremities with ulceration: Secondary | ICD-10-CM | POA: Diagnosis present

## 2016-01-15 DIAGNOSIS — J189 Pneumonia, unspecified organism: Secondary | ICD-10-CM

## 2016-01-15 DIAGNOSIS — N183 Chronic kidney disease, stage 3 unspecified: Secondary | ICD-10-CM | POA: Diagnosis present

## 2016-01-15 DIAGNOSIS — R6521 Severe sepsis with septic shock: Secondary | ICD-10-CM | POA: Diagnosis present

## 2016-01-15 DIAGNOSIS — Z7982 Long term (current) use of aspirin: Secondary | ICD-10-CM | POA: Diagnosis not present

## 2016-01-15 DIAGNOSIS — E1142 Type 2 diabetes mellitus with diabetic polyneuropathy: Secondary | ICD-10-CM | POA: Diagnosis present

## 2016-01-15 DIAGNOSIS — R4182 Altered mental status, unspecified: Secondary | ICD-10-CM | POA: Diagnosis present

## 2016-01-15 DIAGNOSIS — N179 Acute kidney failure, unspecified: Secondary | ICD-10-CM | POA: Diagnosis present

## 2016-01-15 DIAGNOSIS — R404 Transient alteration of awareness: Secondary | ICD-10-CM | POA: Diagnosis not present

## 2016-01-15 DIAGNOSIS — I350 Nonrheumatic aortic (valve) stenosis: Secondary | ICD-10-CM | POA: Diagnosis present

## 2016-01-15 DIAGNOSIS — E1151 Type 2 diabetes mellitus with diabetic peripheral angiopathy without gangrene: Secondary | ICD-10-CM | POA: Diagnosis present

## 2016-01-15 DIAGNOSIS — Z87891 Personal history of nicotine dependence: Secondary | ICD-10-CM

## 2016-01-15 DIAGNOSIS — I872 Venous insufficiency (chronic) (peripheral): Secondary | ICD-10-CM | POA: Diagnosis present

## 2016-01-15 DIAGNOSIS — D72829 Elevated white blood cell count, unspecified: Secondary | ICD-10-CM | POA: Diagnosis not present

## 2016-01-15 DIAGNOSIS — F319 Bipolar disorder, unspecified: Secondary | ICD-10-CM | POA: Diagnosis present

## 2016-01-15 DIAGNOSIS — J15212 Pneumonia due to Methicillin resistant Staphylococcus aureus: Secondary | ICD-10-CM | POA: Diagnosis present

## 2016-01-15 DIAGNOSIS — E785 Hyperlipidemia, unspecified: Secondary | ICD-10-CM | POA: Diagnosis present

## 2016-01-15 DIAGNOSIS — E038 Other specified hypothyroidism: Secondary | ICD-10-CM | POA: Diagnosis not present

## 2016-01-15 DIAGNOSIS — I4729 Other ventricular tachycardia: Secondary | ICD-10-CM

## 2016-01-15 DIAGNOSIS — I472 Ventricular tachycardia: Secondary | ICD-10-CM | POA: Diagnosis present

## 2016-01-15 DIAGNOSIS — F419 Anxiety disorder, unspecified: Secondary | ICD-10-CM | POA: Diagnosis present

## 2016-01-15 DIAGNOSIS — E1159 Type 2 diabetes mellitus with other circulatory complications: Secondary | ICD-10-CM | POA: Diagnosis not present

## 2016-01-15 DIAGNOSIS — E1140 Type 2 diabetes mellitus with diabetic neuropathy, unspecified: Secondary | ICD-10-CM | POA: Diagnosis present

## 2016-01-15 DIAGNOSIS — I70238 Atherosclerosis of native arteries of right leg with ulceration of other part of lower right leg: Secondary | ICD-10-CM | POA: Diagnosis not present

## 2016-01-15 DIAGNOSIS — L97521 Non-pressure chronic ulcer of other part of left foot limited to breakdown of skin: Secondary | ICD-10-CM | POA: Diagnosis present

## 2016-01-15 DIAGNOSIS — Z833 Family history of diabetes mellitus: Secondary | ICD-10-CM | POA: Diagnosis not present

## 2016-01-15 DIAGNOSIS — Z8249 Family history of ischemic heart disease and other diseases of the circulatory system: Secondary | ICD-10-CM | POA: Diagnosis not present

## 2016-01-15 DIAGNOSIS — R531 Weakness: Secondary | ICD-10-CM

## 2016-01-15 DIAGNOSIS — Z7951 Long term (current) use of inhaled steroids: Secondary | ICD-10-CM | POA: Diagnosis not present

## 2016-01-15 LAB — HEPATIC FUNCTION PANEL
ALT: 10 U/L — ABNORMAL LOW (ref 17–63)
AST: 22 U/L (ref 15–41)
Albumin: 2.3 g/dL — ABNORMAL LOW (ref 3.5–5.0)
Alkaline Phosphatase: 96 U/L (ref 38–126)
Bilirubin, Direct: 0.3 mg/dL (ref 0.1–0.5)
Indirect Bilirubin: 0.4 mg/dL (ref 0.3–0.9)
Total Bilirubin: 0.7 mg/dL (ref 0.3–1.2)
Total Protein: 6.2 g/dL — ABNORMAL LOW (ref 6.5–8.1)

## 2016-01-15 LAB — CBC WITH DIFFERENTIAL/PLATELET
Basophils Absolute: 0 10*3/uL (ref 0.0–0.1)
Basophils Relative: 0 %
Eosinophils Absolute: 0.3 10*3/uL (ref 0.0–0.7)
Eosinophils Relative: 2 %
HCT: 31 % — ABNORMAL LOW (ref 39.0–52.0)
Hemoglobin: 9.9 g/dL — ABNORMAL LOW (ref 13.0–17.0)
Lymphocytes Relative: 7 %
Lymphs Abs: 1.2 10*3/uL (ref 0.7–4.0)
MCH: 25.8 pg — ABNORMAL LOW (ref 26.0–34.0)
MCHC: 31.9 g/dL (ref 30.0–36.0)
MCV: 80.7 fL (ref 78.0–100.0)
Monocytes Absolute: 1.4 10*3/uL — ABNORMAL HIGH (ref 0.1–1.0)
Monocytes Relative: 7 %
Neutro Abs: 15.8 10*3/uL — ABNORMAL HIGH (ref 1.7–7.7)
Neutrophils Relative %: 84 %
Platelets: 230 10*3/uL (ref 150–400)
RBC: 3.84 MIL/uL — ABNORMAL LOW (ref 4.22–5.81)
RDW: 15.9 % — ABNORMAL HIGH (ref 11.5–15.5)
WBC: 18.7 10*3/uL — ABNORMAL HIGH (ref 4.0–10.5)

## 2016-01-15 LAB — BASIC METABOLIC PANEL
Anion gap: 9 (ref 5–15)
BUN: 37 mg/dL — ABNORMAL HIGH (ref 6–20)
CO2: 28 mmol/L (ref 22–32)
Calcium: 8 mg/dL — ABNORMAL LOW (ref 8.9–10.3)
Chloride: 101 mmol/L (ref 101–111)
Creatinine, Ser: 1.35 mg/dL — ABNORMAL HIGH (ref 0.61–1.24)
GFR calc Af Amer: 54 mL/min — ABNORMAL LOW (ref >60)
GFR calc non Af Amer: 46 mL/min — ABNORMAL LOW (ref >60)
Glucose, Bld: 133 mg/dL — ABNORMAL HIGH (ref 65–99)
Potassium: 4.1 mmol/L (ref 3.5–5.1)
Sodium: 138 mmol/L (ref 135–145)

## 2016-01-15 LAB — PHOSPHORUS: Phosphorus: 3.5 mg/dL (ref 2.5–4.6)

## 2016-01-15 LAB — MAGNESIUM: Magnesium: 1 mg/dL — ABNORMAL LOW (ref 1.7–2.4)

## 2016-01-15 MED ORDER — MAGNESIUM SULFATE 2 GM/50ML IV SOLN
2.0000 g | Freq: Once | INTRAVENOUS | Status: AC
  Administered 2016-01-15: 2 g via INTRAVENOUS
  Filled 2016-01-15: qty 50

## 2016-01-15 MED ORDER — VANCOMYCIN HCL IN DEXTROSE 1-5 GM/200ML-% IV SOLN
1000.0000 mg | INTRAVENOUS | Status: DC
  Administered 2016-01-16: 1000 mg via INTRAVENOUS
  Filled 2016-01-15: qty 200

## 2016-01-15 MED ORDER — DEXTROSE 5 % IV SOLN
2.0000 g | Freq: Once | INTRAVENOUS | Status: AC
  Administered 2016-01-15: 2 g via INTRAVENOUS
  Filled 2016-01-15: qty 2

## 2016-01-15 MED ORDER — IPRATROPIUM BROMIDE 0.02 % IN SOLN: 0.5000 mg | Freq: Four times a day (QID) | RESPIRATORY_TRACT | Status: DC

## 2016-01-15 MED ORDER — LEVALBUTEROL HCL 1.25 MG/0.5ML IN NEBU
1.2500 mg | INHALATION_SOLUTION | Freq: Four times a day (QID) | RESPIRATORY_TRACT | Status: DC
  Administered 2016-01-15: 1.25 mg via RESPIRATORY_TRACT
  Filled 2016-01-15 (×6): qty 0.5

## 2016-01-15 MED ORDER — VANCOMYCIN HCL IN DEXTROSE 1-5 GM/200ML-% IV SOLN
1000.0000 mg | Freq: Once | INTRAVENOUS | Status: AC
  Administered 2016-01-15: 1000 mg via INTRAVENOUS
  Filled 2016-01-15: qty 200

## 2016-01-15 MED ORDER — DEXTROSE 5 % IV SOLN
2.0000 g | INTRAVENOUS | Status: DC
  Administered 2016-01-16: 2 g via INTRAVENOUS
  Filled 2016-01-15: qty 2

## 2016-01-15 MED ORDER — METHYLPREDNISOLONE SODIUM SUCC 40 MG IJ SOLR
40.0000 mg | Freq: Once | INTRAMUSCULAR | Status: AC
  Administered 2016-01-15: 40 mg via INTRAVENOUS
  Filled 2016-01-15: qty 1

## 2016-01-15 NOTE — ED Notes (Signed)
Bed: [id number] Expected date:  Expected time:  Means of arrival:  Comments: EMS 82yo M altered mental status / elevated WBC

## 2016-01-15 NOTE — ED Notes (Signed)
Brought in by EMS from [facility] facility with c/o altered mental status.  Per EMS, pt has been observed to be more confused.  Pt has just completed his course of ABT for his Pneumonia.  Has had labs done and his WBC "still elevated"--- was sent here for further evaluation and treatment.

## 2016-01-15 NOTE — ED Notes (Signed)
Pt's contact: Louie Casa (son)----- tel# [phone]

## 2016-01-15 NOTE — H&P (Signed)
[facility] History and Physical  Chi Illescas [name] [id number] DOB: [birth year] DOA: 01/15/2016  Referring physician: Virgel Manifold, M.D. PCP: [name], MD   Chief Complaint: Altered mental status.  HPI: [name] is a 82 y.o. male with below past medical history who is brought by EMS from [place] upon leaving the rehabilitation facility with a history of altered mental status.   Per EMS, the staff at the facility reported that the patient has been more confused in the past couple of days. He recently finished an oral antibiotic course with Levaquin 750 mg every other day for pneumonia, but after appearing altered, labs were done and his WBC count was still elevated, so the patient was sent to this facility for further workup and treatment.  In the ER, lab work shows that WBC of 18.7, anemia, hypomagnesemia of 1.0 mg/dL and a chest radiograph shows a left lower lobe infiltrate.   Review of Systems:  Unable to review due to the patient's mental status.  Past Medical History  Diagnosis Date  . Hyperlipidemia   . COPD (chronic obstructive pulmonary disease) ([place])   . Leg pain   . GERD (gastroesophageal reflux disease)   . Depression   . Dizziness   . Productive cough   . Thyroid disease   . Peripheral vascular disease ([place])   . ED (erectile dysfunction)   . Hypertension     08/17/12 - wife denies  . Walking pneumonia 2000's  . H/O hiatal hernia   . Migraine     "when I was a young boy"  . Arthritis     "hands" (08/28/2014)  . Anxiety   . Hypothyroidism   . Chronic kidney disease (CKD), stage III (moderate)     [name] 08/28/2014   Past Surgical History  Procedure Laterality Date  . Renal artery stent Left   . Kidney stone surgery  1989  . Lower extremity angiogram Left 03-20-2014    Dr. Tinnie Gens  . Tonsillectomy    . Cataract extraction, bilateral Bilateral   . Blepharoplasty Right   . Abdominal aortagram N/A 03/20/2013    Procedure: ABDOMINAL  Maxcine Ham;  Surgeon: [name], MD;  Location: [facility] CATH LAB;  Service: Cardiovascular;  Laterality: N/A;  . Lower extremity angiogram Left 03/20/2013    Procedure: LOWER EXTREMITY ANGIOGRAM;  Surgeon: [name], MD;  Location: [facility] CATH LAB;  Service: Cardiovascular;  Laterality: Left;   Social History:  reports that he quit smoking about 3 years ago. His smoking use included Cigarettes. He has a 25 pack-year smoking history. He has never used smokeless tobacco. He reports that he drinks alcohol. He reports that he does not use illicit drugs.  No Known Allergies  Family History  Problem Relation Age of Onset  . Other Mother     AAA  . AAA (abdominal aortic aneurysm) Mother   . Heart attack Father   . Alcohol abuse Father   . Diabetes Father   . Other Father     amputation  . Diabetes Brother   . Coronary artery disease Brother   . Dementia Brother     Prior to Admission medications   Medication Sig Start Date End Date Taking? Authorizing Provider  acetaminophen (TYLENOL) 325 MG tablet Take 650 mg by mouth every 6 (six) hours as needed for mild pain, moderate pain, fever or headache.    Yes Historical Provider, MD  aspirin EC 81 MG tablet Take 81 mg by mouth every morning.  Yes Historical Provider, MD  bisacodyl (DULCOLAX) 10 MG suppository Place 10 mg rectally daily as needed for moderate constipation.   Yes Historical Provider, MD  carbidopa-levodopa (SINEMET) 10-100 MG per tablet Take 1 tablet by mouth every evening.    Yes Historical Provider, MD  cyclobenzaprine (FLEXERIL) 10 MG tablet Take 10 mg by mouth 2 (two) times daily. 07/04/15  Yes Historical Provider, MD  diltiazem (CARDIZEM CD) 240 MG 24 hr capsule Take 240 mg by mouth daily. Take with 120 mg capsule to equal 360 mg   Yes Historical Provider, MD  diltiazem (DILACOR XR) 120 MG 24 hr capsule Take 120 mg by mouth daily. Takes with 240 mg capsule to equal 360 mg   Yes Historical Provider, MD  feeding supplement  (ENSURE) PUDG Take 1 Container by mouth 3 (three) times daily between meals. Patient taking differently: Take 1 Container by mouth 2 (two) times daily. At 2 pm and QHS 08/23/12  Yes Shon Baton, MD  fentaNYL (DURAGESIC - DOSED MCG/HR) 50 MCG/HR Place 1 patch (50 mcg total) onto the skin every 3 (three) days. 12/22/15  Yes Estill Dooms, MD  Fluticasone-Salmeterol (ADVAIR) 250-50 MCG/DOSE AEPB Inhale 1-2 puffs into the lungs 2 (two) times daily as needed (for wheezing.).   Yes Historical Provider, MD  furosemide (LASIX) 40 MG tablet Take 1 tablet (40 mg total) by mouth 2 (two) times daily. Patient taking differently: Take 80-120 mg by mouth 2 (two) times daily. 120 mg in the morning and 80 mg at bedtime. 07/12/15  Yes Nita Sells, MD  gabapentin (NEURONTIN) 300 MG capsule Take 1 capsule (300 mg total) by mouth 2 (two) to 3 (three)  times daily. Patient taking differently: Take 300 mg by mouth 3 (three) times daily.  09/02/14  Yes Shon Baton, MD  ipratropium (ATROVENT) 0.02 % nebulizer solution Take 500 mcg by nebulization every 6 (six) hours as needed. For shortness of breath   Yes Historical Provider, MD  ipratropium-albuterol (DUONEB) 0.5-2.5 (3) MG/3ML SOLN Take 3 mLs by nebulization every 6 (six) hours as needed (shortness of breathe.).   Yes Historical Provider, MD  levofloxacin (LEVAQUIN) 750 MG tablet Take 750 mg by mouth every other day. 01/03/16 01/23/16 Yes Historical Provider, MD  levothyroxine (SYNTHROID, LEVOTHROID) 112 MCG tablet Take 112 mcg by mouth every morning.    Yes Historical Provider, MD  linagliptin (TRADJENTA) 5 MG TABS tablet Take 1 tablet (5 mg total) by mouth daily. 10/09/14  Yes Shon Baton, MD  LORazepam (ATIVAN) 0.5 MG tablet Take 0.5 mg by mouth 3 (three) times daily.  06/02/15  Yes Historical Provider, MD  magnesium hydroxide (MILK OF MAGNESIA) 400 MG/5ML suspension Take 30 mLs by mouth daily as needed for mild constipation.   Yes Historical Provider, MD    Melatonin-Pyridoxine (MELATIN PO) Take 1 tablet by mouth at bedtime.    Yes Historical Provider, MD  Multiple Vitamin (MULTIVITAMIN WITH MINERALS) TABS tablet Take 1 tablet by mouth daily.   Yes Historical Provider, MD  omeprazole (PRILOSEC) 20 MG capsule Take 20 mg by mouth daily.   Yes Historical Provider, MD  oxycodone (OXY-IR) 5 MG capsule Take 5 mg by mouth every 6 (six) hours as needed for pain.   Yes Historical Provider, MD  potassium chloride SA (K-DUR,KLOR-CON) 20 MEQ tablet Take 2 tablets (40 mEq total) by mouth once. Patient taking differently: Take 20 mEq by mouth 2 (two) times daily.  07/04/15  Yes Bonnielee Haff, MD  QUEtiapine (SEROQUEL) 25 MG tablet  Take 1 tablet (25 mg total) by mouth at bedtime. May take 1-2 during the day as needed. Patient taking differently: Take 25 mg by mouth at bedtime.  10/09/14  Yes Shon Baton, MD  sertraline (ZOLOFT) 100 MG tablet Take 100 mg by mouth every evening.    Yes Historical Provider, MD  simvastatin (ZOCOR) 10 MG tablet Take 10 mg by mouth daily.   Yes Historical Provider, MD  diltiazem (CARDIZEM CD) 360 MG 24 hr capsule Take 1 capsule (360 mg total) by mouth daily. Patient not taking: Reported on 01/15/2016 10/09/14   Shon Baton, MD   Physical Exam: Filed Vitals:   01/15/16 2200 01/15/16 2215 01/15/16 2230 01/15/16 2331  BP: 114/56  122/70 129/61  Pulse: 53 79  79  Temp:      TempSrc:      Resp: 20 19 19 18   Height:      Weight:      SpO2: 95% 93%  93%    Wt Readings from Last 3 Encounters:  01/15/16 72.576 kg (160 lb)  10/13/15 72 kg (158 lb 11.7 oz)  08/26/15 87.998 kg (194 lb)    General:  Looks acutely ill, but in no acute distress. Eyes: PERRL, normal lids, irises & conjunctiva ENT: grossly normal hearing, lips and oral mucosa are very dry. Neck: no LAD, masses or thyromegaly Cardiovascular: RRR, + PACs, no m/r/g. + LE edema. Telemetry: Sinus arrhythmia. Respiratory: Decreased breath sounds at the bases, left > right,  Bilateral rhonchi, crackles,                      wheezing. No accessory muscle use. Abdomen: soft, ntnd Skin: Dressings in lower extremities. Musculoskeletal: Dressings in place in lower extremities. Psychiatric: Sleeping, but arousable. Neurologic: Sleeping, but arousable. Oriented to name and place, disoriented to time,                                moves all extremities, unable to fully evaluate.           Labs on Admission:  Basic Metabolic Panel:  Recent Labs Lab 01/15/16 2106  NA 138  K 4.1  CL 101  CO2 28  GLUCOSE 133*  BUN 37*  CREATININE 1.35*  CALCIUM 8.0*  MG 1.0*  PHOS 3.5   Liver Function Tests:  Recent Labs Lab 01/15/16 2106  AST 22  ALT 10*  ALKPHOS 96  BILITOT 0.7  PROT 6.2*  ALBUMIN 2.3*   CBC:  Recent Labs Lab 01/15/16 2106  WBC 18.7*  NEUTROABS 15.8*  HGB 9.9*  HCT 31.0*  MCV 80.7  PLT 230    BNP (last 3 results)  Recent Labs  07/02/15 0250 07/08/15 1838 07/09/15 0425  BNP 181.6* 148.9* 215.9*    Radiological Exams on Admission: Dg Chest 2 View  01/15/2016  CLINICAL DATA:  Altered mental status. Recent treatment for pneumonia. Productive cough. History of COPD and hypertension. EXAM: CHEST  2 VIEW COMPARISON:  07/10/2015 FINDINGS: Cardiac enlargement with mild pulmonary vascular congestion. Interstitial changes in the lung bases likely representing mild edema. There is superimposed airspace disease in the left lung base with small left pleural effusion likely representing superimposed pneumonia. No pneumothorax. Calcified and tortuous aorta. Degenerative changes in the spine and shoulders. IMPRESSION: Cardiac enlargement with pulmonary vascular congestion and mild interstitial edema. Superimposed airspace consolidation in the left lung base with small left pleural effusion likely representing  pneumonia. Electronically Signed   By: Lucienne Capers M.D.   On: 01/15/2016 21:14   echocardiogram  07/03/2015  ------------------------------------------------------------------- LV EF: 60% -  65%  ------------------------------------------------------------------- Indications:   (I50.31).  ------------------------------------------------------------------- History:  PMH: Acquired from the patient and from the patient&'s chart. Congestive heart failure.  ------------------------------------------------------------------- Study Conclusions  - Left ventricle: The cavity size was normal. There was mild concentric hypertrophy. Systolic function was normal. The estimated ejection fraction was in the range of 60% to 65%. There is akinesis of the basalinferior myocardium. - Aortic valve: Valve mobility was restricted. There was moderate to severe stenosis. Peak velocity (S): 393 cm/s. Mean gradient (S): 34 mm Hg. - Aorta: Ascending aortic diameter: 41 mm (S). - Ascending aorta: The ascending aorta was mildly dilated. - Mitral valve: Calcified annulus. - Left atrium: The atrium was mildly dilated.  Impressions:  - When compared to prior echocardiogram, aortic stenosis has advanced.  EKG: Independently reviewed. Vent. rate 100 BPM PR interval 125 ms QRS duration 150 ms QT/QTc 399/515 ms P-R-T axes -67 -63 82 Sinus tachycardia with irregular rate RBBB and LAFB Probable left ventricular hypertrophy Baseline wander in lead(s) V3  Assessment/Plan Principal Problem:   HCAP (healthcare-associated pneumonia) Patient mental status is improving, now arousable, briefly awake, oriented to self and place. Admit to telemetry. Continue supplemental oxygen. Continue IV antibiotics to cover for nosocomial pathogens. Bronchodilators every 6 hours. Gentle IV hydration. Follow-up blood cultures and sensitivity. Check a sputum Gram stain, culture and sensitivity.       Active Problems:   Altered mental status. Secondary to pneumonia. Continue treatment for HCAP     Hypomagnesemia Replacing with 6 g of magnesium sulfate. Since the patient has such a low level and is on furosemide, long-term replacement to be considered.    COPD with acute exacerbation (HCC) Continue supplemental oxygen. Continue Levalbuterol since the patient is tachycardic at times, has PACs and a history of aortic stenosis. Nebulized ipratropium every 6 hours. Single dose Solu-Medrol 40 mg IVP 1 dose.      Hypothyroidism Continue levothyroxine. Check TSH periodically.    GERD Continue Protonix 40 mg by mouth daily.      Atherosclerosis of native arteries of the extremities with ulceration ([place])   PAD (peripheral artery disease) ([place]) Continue local wound care.    DM2 (diabetes mellitus, type 2) (HCC) Carbohydrate modified diet. CBG monitoring before meals.    Aortic stenosis Monitor intake and output. Careful and slow IV hydration given. Reevaluate in a.m. need of further rehydration. Resume furosemide 48-72 hours or when appropriate.    Bipolar disorder ([place])   Anxiety disorder Per last nursing home note, the patient has been well managed on current regimen. Continue current psychotropic meds.    Code Status: Full code. DVT Prophylaxis: Lovenox SQ. Family Communication:  Disposition Plan: Admit to telemetry for IV antibiotics for several days.  Time spent: Over 70 minutes were spent during the process of this admission.  Reubin Milan, MD [facility] Pager [phone].

## 2016-01-15 NOTE — ED Provider Notes (Signed)
CSN: [id number]     Arrival date & time 01/15/16  2005 History   First MD Initiated Contact with Patient 01/15/16 2032     Chief Complaint  Patient presents with  . Altered Mental Status     (Consider location/radiation/quality/duration/timing/severity/associated sxs/prior Treatment) HPI   82 year old male sent from facility for evaluation of altered mental status and persistent leukocytosis.recently on Levaquin for pneumonia.Apparently he has not Improved since starting treatment. Patient currently has no complaints but is confused and I feelthat an unreliable historian. There is no report of any recent trauma. No report of any recent medication changes aside from antibiotics.  Past Medical History  Diagnosis Date  . Hyperlipidemia   . COPD (chronic obstructive pulmonary disease) ([place])   . Leg pain   . GERD (gastroesophageal reflux disease)   . Depression   . Dizziness   . Productive cough   . Thyroid disease   . Peripheral vascular disease ([place])   . ED (erectile dysfunction)   . Hypertension     08/17/12 - wife denies  . Walking pneumonia 2000's  . H/O hiatal hernia   . Migraine     "when I was a young boy"  . Arthritis     "hands" (08/28/2014)  . Anxiety   . Hypothyroidism   . Chronic kidney disease (CKD), stage III (moderate)     [name] 08/28/2014   Past Surgical History  Procedure Laterality Date  . Renal artery stent Left   . Kidney stone surgery  1989  . Lower extremity angiogram Left 03-20-2014    Dr. Tinnie Gens  . Tonsillectomy    . Cataract extraction, bilateral Bilateral   . Blepharoplasty Right   . Abdominal aortagram N/A 03/20/2013    Procedure: ABDOMINAL Maxcine Ham;  Surgeon: [name], MD;  Location: [facility] CATH LAB;  Service: Cardiovascular;  Laterality: N/A;  . Lower extremity angiogram Left 03/20/2013    Procedure: LOWER EXTREMITY ANGIOGRAM;  Surgeon: [name], MD;  Location: [facility] CATH LAB;  Service: Cardiovascular;  Laterality: Left;   Family  History  Problem Relation Age of Onset  . Other Mother     AAA  . AAA (abdominal aortic aneurysm) Mother   . Heart attack Father   . Alcohol abuse Father   . Diabetes Father   . Other Father     amputation  . Diabetes Brother   . Coronary artery disease Brother   . Dementia Brother    Social History  Substance Use Topics  . Smoking status: Former Smoker -- 1.00 packs/day for 25 years    Types: Cigarettes    Quit date: 08/17/2012  . Smokeless tobacco: Never Used  . Alcohol Use: 0.0 oz/week    0 Standard drinks or equivalent per week     Comment: 08/28/2014 "last drink was in ~ 2012"    Review of Systems  All systems reviewed and negative, other than as noted in HPI.   Allergies  Review of patient's allergies indicates no known allergies.  Home Medications   Prior to Admission medications   Medication Sig Start Date End Date Taking? Authorizing Provider  acetaminophen (TYLENOL) 325 MG tablet Take 650 mg by mouth every 6 (six) hours as needed for mild pain, moderate pain, fever or headache.    Yes Historical Provider, MD  aspirin EC 81 MG tablet Take 81 mg by mouth every morning.    Yes Historical Provider, MD  bisacodyl (DULCOLAX) 10 MG suppository Place 10 mg rectally daily as  needed for moderate constipation.   Yes Historical Provider, MD  carbidopa-levodopa (SINEMET) 10-100 MG per tablet Take 1 tablet by mouth every evening.    Yes Historical Provider, MD  cyclobenzaprine (FLEXERIL) 10 MG tablet Take 10 mg by mouth 2 (two) times daily. 07/04/15  Yes Historical Provider, MD  diltiazem (CARDIZEM CD) 240 MG 24 hr capsule Take 240 mg by mouth daily. Take with 120 mg capsule to equal 360 mg   Yes Historical Provider, MD  diltiazem (DILACOR XR) 120 MG 24 hr capsule Take 120 mg by mouth daily. Takes with 240 mg capsule to equal 360 mg   Yes Historical Provider, MD  feeding supplement (ENSURE) PUDG Take 1 Container by mouth 3 (three) times daily between meals. Patient taking  differently: Take 1 Container by mouth 2 (two) times daily. At 2 pm and QHS 08/23/12  Yes Shon Baton, MD  fentaNYL (DURAGESIC - DOSED MCG/HR) 50 MCG/HR Place 1 patch (50 mcg total) onto the skin every 3 (three) days. 12/22/15  Yes Estill Dooms, MD  Fluticasone-Salmeterol (ADVAIR) 250-50 MCG/DOSE AEPB Inhale 1-2 puffs into the lungs 2 (two) times daily as needed (for wheezing.).   Yes Historical Provider, MD  furosemide (LASIX) 40 MG tablet Take 1 tablet (40 mg total) by mouth 2 (two) times daily. Patient taking differently: Take 120 mg by mouth daily.  07/12/15  Yes Nita Sells, MD  furosemide (LASIX) 80 MG tablet Take 80 mg by mouth daily.   Yes Historical Provider, MD  ipratropium (ATROVENT) 0.02 % nebulizer solution Take 500 mcg by nebulization every 6 (six) hours as needed. For shortness of breath   Yes Historical Provider, MD  ipratropium-albuterol (DUONEB) 0.5-2.5 (3) MG/3ML SOLN Take 3 mLs by nebulization every 6 (six) hours as needed (shortness of breathe.).   Yes Historical Provider, MD  levofloxacin (LEVAQUIN) 750 MG tablet Take 750 mg by mouth every other day. 01/03/16 01/23/16 Yes Historical Provider, MD  levothyroxine (SYNTHROID, LEVOTHROID) 112 MCG tablet Take 112 mcg by mouth every morning.    Yes Historical Provider, MD  linagliptin (TRADJENTA) 5 MG TABS tablet Take 1 tablet (5 mg total) by mouth daily. 10/09/14  Yes Shon Baton, MD  LORazepam (ATIVAN) 0.5 MG tablet Take 0.5 mg by mouth 3 (three) times daily.  06/02/15  Yes Historical Provider, MD  magnesium hydroxide (MILK OF MAGNESIA) 400 MG/5ML suspension Take 30 mLs by mouth daily as needed for mild constipation.   Yes Historical Provider, MD  Melatonin-Pyridoxine (MELATIN PO) Take 1 tablet by mouth at bedtime.    Yes Historical Provider, MD  Multiple Vitamin (MULTIVITAMIN WITH MINERALS) TABS tablet Take 1 tablet by mouth daily.   Yes Historical Provider, MD  omeprazole (PRILOSEC) 20 MG capsule Take 20 mg by mouth daily.   Yes  Historical Provider, MD  oxycodone (OXY-IR) 5 MG capsule Take 5 mg by mouth every 6 (six) hours as needed for pain.   Yes Historical Provider, MD  potassium chloride SA (K-DUR,KLOR-CON) 20 MEQ tablet Take 2 tablets (40 mEq total) by mouth once. Patient taking differently: Take 20 mEq by mouth 2 (two) times daily.  07/04/15  Yes Bonnielee Haff, MD  QUEtiapine (SEROQUEL) 25 MG tablet Take 1 tablet (25 mg total) by mouth at bedtime. May take 1-2 during the day as needed. Patient taking differently: Take 25 mg by mouth at bedtime.  10/09/14  Yes Shon Baton, MD  sertraline (ZOLOFT) 100 MG tablet Take 100 mg by mouth every evening.    Yes  Historical Provider, MD  simvastatin (ZOCOR) 10 MG tablet Take 10 mg by mouth daily.   Yes Historical Provider, MD  diltiazem (CARDIZEM CD) 360 MG 24 hr capsule Take 1 capsule (360 mg total) by mouth daily. Patient not taking: Reported on 01/15/2016 10/09/14   Shon Baton, MD  gabapentin (NEURONTIN) 300 MG capsule Take 1 capsule (300 mg total) by mouth 2 (two) to 3 (three)  times daily. Patient taking differently: Take 300 mg by mouth 3 (three) times daily.  09/02/14   Shon Baton, MD   BP 145/79 mmHg  Pulse 77  Temp(Src) 96.7 F (35.9 C) (Rectal)  Resp 28  Ht 6' (1.829 m)  Wt 160 lb (72.576 kg)  BMI 21.70 kg/m2  SpO2 91% Physical Exam  Constitutional: He appears well-developed and well-nourished. No distress.  HENT:  Head: Normocephalic and atraumatic.  Eyes: Conjunctivae are normal. Right eye exhibits no discharge. Left eye exhibits no discharge.  Neck: Neck supple.  Cardiovascular: Normal rate, regular rhythm and normal heart sounds.  Exam reveals no gallop and no friction rub.   No murmur heard. Pulmonary/Chest: Effort normal and breath sounds normal. No respiratory distress.  Abdominal: Soft. He exhibits no distension. There is no tenderness.  Musculoskeletal: He exhibits no edema or tenderness.  Neurological: He is alert.  Somewhat drowsy. Opens eyes to  voice.. Disoriented to time and place.No focal motor deficits noted.  Skin: Skin is warm and dry.  Psychiatric: He has a normal mood and affect.  Nursing note and vitals reviewed.   ED Course  Procedures (including critical care time) Labs Review Labs Reviewed  CBC WITH DIFFERENTIAL/PLATELET - Abnormal; Notable for the following:    WBC 18.7 (*)    RBC 3.84 (*)    Hemoglobin 9.9 (*)    HCT 31.0 (*)    MCH 25.8 (*)    RDW 15.9 (*)    Neutro Abs 15.8 (*)    Monocytes Absolute 1.4 (*)    All other components within normal limits  BASIC METABOLIC PANEL - Abnormal; Notable for the following:    Glucose, Bld 133 (*)    BUN 37 (*)    Creatinine, Ser 1.35 (*)    Calcium 8.0 (*)    GFR calc non Af Amer 46 (*)    GFR calc Af Amer 54 (*)    All other components within normal limits  URINALYSIS, ROUTINE W REFLEX MICROSCOPIC (NOT AT [facility])    Imaging Review Dg Chest 2 View  01/15/2016  CLINICAL DATA:  Altered mental status. Recent treatment for pneumonia. Productive cough. History of COPD and hypertension. EXAM: CHEST  2 VIEW COMPARISON:  07/10/2015 FINDINGS: Cardiac enlargement with mild pulmonary vascular congestion. Interstitial changes in the lung bases likely representing mild edema. There is superimposed airspace disease in the left lung base with small left pleural effusion likely representing superimposed pneumonia. No pneumothorax. Calcified and tortuous aorta. Degenerative changes in the spine and shoulders. IMPRESSION: Cardiac enlargement with pulmonary vascular congestion and mild interstitial edema. Superimposed airspace consolidation in the left lung base with small left pleural effusion likely representing pneumonia. Electronically Signed   By: Lucienne Capers M.D.   On: 01/15/2016 21:14   I have personally reviewed and evaluated these images and lab results as part of my medical decision-making.   EKG Interpretation None      MDM   Final diagnoses:  Type 2 diabetes  mellitus with other circulatory complication [facility])  HCAP  83 year old male with continued confusion and  persistent left-sided infiltrate despite outpatient treatment with Levaquin. Will admit for IV antibiotics.    Virgel Manifold, MD 01/20/16 [id number]

## 2016-01-15 NOTE — Progress Notes (Signed)
ANTIBIOTIC CONSULT NOTE - INITIAL  Pharmacy Consult for Cefepime & Vancomycin Indication: pneumonia  No Known Allergies  Patient Measurements: Height: 6' (182.9 cm) Weight: 160 lb (72.576 kg) IBW/kg (Calculated) : 77.6  Vital Signs: Temp: 96.7 F (35.9 C) (01/27 2111) Temp Source: Rectal (01/27 2111) BP: 145/79 mmHg (01/27 2021) Pulse Rate: 77 (01/27 2021) Intake/Output from previous day:   Intake/Output from this shift:    Labs:  Recent Labs  01/15/16 2106  WBC 18.7*  HGB 9.9*  PLT 230   CrCl cannot be calculated (Patient has no serum creatinine result on file.). No results for input(s): VANCOTROUGH, VANCOPEAK, VANCORANDOM, GENTTROUGH, GENTPEAK, GENTRANDOM, TOBRATROUGH, TOBRAPEAK, TOBRARND, AMIKACINPEAK, AMIKACINTROU, AMIKACIN in the last 72 hours.   Microbiology: No results found for this or any previous visit (from the past 720 hour(s)).  Medical History: Past Medical History  Diagnosis Date  . Hyperlipidemia   . COPD (chronic obstructive pulmonary disease) ([place])   . Leg pain   . GERD (gastroesophageal reflux disease)   . Depression   . Dizziness   . Productive cough   . Thyroid disease   . Peripheral vascular disease ([place])   . ED (erectile dysfunction)   . Hypertension     08/17/12 - wife denies  . Walking pneumonia 2000's  . H/O hiatal hernia   . Migraine     "when I was a young boy"  . Arthritis     "hands" (08/28/2014)  . Anxiety   . Hypothyroidism   . Chronic kidney disease (CKD), stage III (moderate)     [name] 08/28/2014    Medications:  Anti-infectives    Start     Dose/Rate Route Frequency Ordered Stop   01/15/16 2145  ceFEPIme (MAXIPIME) 2 g in dextrose 5 % 50 mL IVPB     2 g 100 mL/hr over 30 Minutes Intravenous  Once 01/15/16 2140     01/15/16 2145  vancomycin (VANCOCIN) IVPB 1000 mg/200 mL premix     1,000 mg 200 mL/hr over 60 Minutes Intravenous  Once 01/15/16 2140       Assessment: 82 yo M sent from nursing facility with  altered mental status.  He recently completed antibiotics for PNA.   Pharmacy asked to dose Vancomycin & Cefepime for HCAP.    01/15/2016:  Tc 96.32F   WBC elevated (18.7)  CXR + RLL PNA  Scr 1.35- CrCl ~40 (CG/N)   Goal of Therapy:  Vancomycin trough level 15-20 mcg/ml  Eradicate infection.  Plan:  Cefepime 2g IV q24h Vancomycin 1gm IV q24h Check Vancomycin trough at steady state Monitor renal function and cx data   Biagio Borg 01/15/2016,9:41 PM

## 2016-01-16 DIAGNOSIS — I7025 Atherosclerosis of native arteries of other extremities with ulceration: Secondary | ICD-10-CM

## 2016-01-16 DIAGNOSIS — J189 Pneumonia, unspecified organism: Secondary | ICD-10-CM

## 2016-01-16 DIAGNOSIS — N183 Chronic kidney disease, stage 3 unspecified: Secondary | ICD-10-CM | POA: Diagnosis present

## 2016-01-16 DIAGNOSIS — E038 Other specified hypothyroidism: Secondary | ICD-10-CM

## 2016-01-16 DIAGNOSIS — J441 Chronic obstructive pulmonary disease with (acute) exacerbation: Secondary | ICD-10-CM

## 2016-01-16 DIAGNOSIS — F3175 Bipolar disorder, in partial remission, most recent episode depressed: Secondary | ICD-10-CM

## 2016-01-16 DIAGNOSIS — I350 Nonrheumatic aortic (valve) stenosis: Secondary | ICD-10-CM

## 2016-01-16 DIAGNOSIS — E1159 Type 2 diabetes mellitus with other circulatory complications: Secondary | ICD-10-CM

## 2016-01-16 DIAGNOSIS — I739 Peripheral vascular disease, unspecified: Secondary | ICD-10-CM

## 2016-01-16 DIAGNOSIS — N179 Acute kidney failure, unspecified: Secondary | ICD-10-CM | POA: Diagnosis present

## 2016-01-16 DIAGNOSIS — R404 Transient alteration of awareness: Secondary | ICD-10-CM

## 2016-01-16 DIAGNOSIS — R4182 Altered mental status, unspecified: Secondary | ICD-10-CM | POA: Diagnosis present

## 2016-01-16 LAB — CBC WITH DIFFERENTIAL/PLATELET
BASOS ABS: 0 10*3/uL (ref 0.0–0.1)
Basophils Relative: 0 %
EOS ABS: 0.1 10*3/uL (ref 0.0–0.7)
EOS PCT: 1 %
HCT: 31.3 % — ABNORMAL LOW (ref 39.0–52.0)
Hemoglobin: 10.1 g/dL — ABNORMAL LOW (ref 13.0–17.0)
LYMPHS PCT: 3 %
Lymphs Abs: 0.5 10*3/uL — ABNORMAL LOW (ref 0.7–4.0)
MCH: 26 pg (ref 26.0–34.0)
MCHC: 32.3 g/dL (ref 30.0–36.0)
MCV: 80.5 fL (ref 78.0–100.0)
MONO ABS: 0.3 10*3/uL (ref 0.1–1.0)
Monocytes Relative: 2 %
Neutro Abs: 14.5 10*3/uL — ABNORMAL HIGH (ref 1.7–7.7)
Neutrophils Relative %: 94 %
PLATELETS: 231 10*3/uL (ref 150–400)
RBC: 3.89 MIL/uL — AB (ref 4.22–5.81)
RDW: 15.8 % — AB (ref 11.5–15.5)
WBC: 15.4 10*3/uL — AB (ref 4.0–10.5)

## 2016-01-16 LAB — COMPREHENSIVE METABOLIC PANEL
ALT: 10 U/L — ABNORMAL LOW (ref 17–63)
AST: 19 U/L (ref 15–41)
Albumin: 2.4 g/dL — ABNORMAL LOW (ref 3.5–5.0)
Alkaline Phosphatase: 98 U/L (ref 38–126)
Anion gap: 9 (ref 5–15)
BUN: 32 mg/dL — ABNORMAL HIGH (ref 6–20)
CHLORIDE: 100 mmol/L — AB (ref 101–111)
CO2: 28 mmol/L (ref 22–32)
Calcium: 8.1 mg/dL — ABNORMAL LOW (ref 8.9–10.3)
Creatinine, Ser: 1.29 mg/dL — ABNORMAL HIGH (ref 0.61–1.24)
GFR, EST AFRICAN AMERICAN: 57 mL/min — AB (ref >60)
GFR, EST NON AFRICAN AMERICAN: 49 mL/min — AB (ref >60)
Glucose, Bld: 146 mg/dL — ABNORMAL HIGH (ref 65–99)
POTASSIUM: 3.9 mmol/L (ref 3.5–5.1)
SODIUM: 137 mmol/L (ref 135–145)
Total Bilirubin: 1.1 mg/dL (ref 0.3–1.2)
Total Protein: 6.2 g/dL — ABNORMAL LOW (ref 6.5–8.1)

## 2016-01-16 LAB — URINALYSIS, ROUTINE W REFLEX MICROSCOPIC
Bilirubin Urine: NEGATIVE
Glucose, UA: NEGATIVE mg/dL
Hgb urine dipstick: NEGATIVE
Ketones, ur: NEGATIVE mg/dL
Leukocytes, UA: NEGATIVE
Nitrite: NEGATIVE
Protein, ur: NEGATIVE mg/dL
Specific Gravity, Urine: 1.013 (ref 1.005–1.030)
pH: 5.5 (ref 5.0–8.0)

## 2016-01-16 LAB — MAGNESIUM: Magnesium: 1.6 mg/dL — ABNORMAL LOW (ref 1.7–2.4)

## 2016-01-16 LAB — EXPECTORATED SPUTUM ASSESSMENT W REFEX TO RESP CULTURE

## 2016-01-16 LAB — INFLUENZA PANEL BY PCR (TYPE A & B)
H1N1 flu by pcr: NOT DETECTED
INFLAPCR: NEGATIVE
Influenza B By PCR: NEGATIVE

## 2016-01-16 LAB — GLUCOSE, CAPILLARY
GLUCOSE-CAPILLARY: 154 mg/dL — AB (ref 65–99)
GLUCOSE-CAPILLARY: 123 mg/dL — AB (ref 65–99)
Glucose-Capillary: 172 mg/dL — ABNORMAL HIGH (ref 65–99)
Glucose-Capillary: 113 mg/dL — ABNORMAL HIGH (ref 65–99)

## 2016-01-16 LAB — STREP PNEUMONIAE URINARY ANTIGEN: STREP PNEUMO URINARY ANTIGEN: NEGATIVE

## 2016-01-16 MED ORDER — DILTIAZEM HCL ER COATED BEADS 240 MG PO CP24: 240.0000 mg | ORAL_CAPSULE | Freq: Every day | ORAL | Status: DC

## 2016-01-16 MED ORDER — GUAIFENESIN ER 600 MG PO TB12
600.0000 mg | ORAL_TABLET | Freq: Two times a day (BID) | ORAL | Status: DC
  Administered 2016-01-16 – 2016-01-19 (×7): 600 mg via ORAL
  Filled 2016-01-16 (×10): qty 1

## 2016-01-16 MED ORDER — CARBIDOPA-LEVODOPA 10-100 MG PO TABS
1.0000 | ORAL_TABLET | Freq: Every evening | ORAL | Status: DC
Start: 2016-01-16 — End: 2016-01-19
  Administered 2016-01-16 – 2016-01-18 (×3): 1 via ORAL
  Filled 2016-01-16 (×4): qty 1

## 2016-01-16 MED ORDER — SIMVASTATIN 10 MG PO TABS
10.0000 mg | ORAL_TABLET | Freq: Every day | ORAL | Status: DC
  Administered 2016-01-16 – 2016-01-19 (×4): 10 mg via ORAL
  Filled 2016-01-16 (×4): qty 1

## 2016-01-16 MED ORDER — SODIUM CHLORIDE 0.9 % IV SOLN
INTRAVENOUS | Status: AC
  Administered 2016-01-16: 01:00:00 via INTRAVENOUS

## 2016-01-16 MED ORDER — FENTANYL 50 MCG/HR TD PT72
50.0000 ug | MEDICATED_PATCH | TRANSDERMAL | Status: DC
  Administered 2016-01-16 – 2016-01-19 (×2): 50 ug via TRANSDERMAL
  Filled 2016-01-16 (×2): qty 1

## 2016-01-16 MED ORDER — ENOXAPARIN SODIUM 40 MG/0.4ML ~~LOC~~ SOLN
40.0000 mg | SUBCUTANEOUS | Status: DC
  Administered 2016-01-16 – 2016-01-19 (×4): 40 mg via SUBCUTANEOUS
  Filled 2016-01-16 (×4): qty 0.4

## 2016-01-16 MED ORDER — ACETAMINOPHEN 325 MG PO TABS
650.0000 mg | ORAL_TABLET | Freq: Four times a day (QID) | ORAL | Status: DC | PRN
  Administered 2016-01-16 – 2016-01-17 (×2): 650 mg via ORAL
  Filled 2016-01-16 (×2): qty 2

## 2016-01-16 MED ORDER — LEVALBUTEROL HCL 0.63 MG/3ML IN NEBU
0.6300 mg | INHALATION_SOLUTION | Freq: Four times a day (QID) | RESPIRATORY_TRACT | Status: DC
  Administered 2016-01-16: 0.63 mg via RESPIRATORY_TRACT
  Filled 2016-01-16: qty 3

## 2016-01-16 MED ORDER — DILTIAZEM HCL ER 180 MG PO CP24
360.0000 mg | ORAL_CAPSULE | Freq: Every day | ORAL | Status: DC
  Administered 2016-01-16 – 2016-01-19 (×4): 360 mg via ORAL
  Filled 2016-01-16 (×4): qty 2

## 2016-01-16 MED ORDER — SERTRALINE HCL 100 MG PO TABS
100.0000 mg | ORAL_TABLET | Freq: Every evening | ORAL | Status: DC
  Administered 2016-01-16 – 2016-01-18 (×3): 100 mg via ORAL
  Filled 2016-01-16 (×4): qty 1

## 2016-01-16 MED ORDER — ONDANSETRON HCL 4 MG/2ML IJ SOLN
4.0000 mg | Freq: Four times a day (QID) | INTRAMUSCULAR | Status: DC | PRN
Start: 2016-01-16 — End: 2016-01-19
  Administered 2016-01-17: 4 mg via INTRAVENOUS
  Filled 2016-01-16: qty 2

## 2016-01-16 MED ORDER — SODIUM CHLORIDE 0.9% FLUSH
3.0000 mL | Freq: Two times a day (BID) | INTRAVENOUS | Status: DC
  Administered 2016-01-19: 3 mL via INTRAVENOUS

## 2016-01-16 MED ORDER — DILTIAZEM HCL ER 120 MG PO CP24: 120.0000 mg | ORAL_CAPSULE | Freq: Every day | ORAL | Status: DC

## 2016-01-16 MED ORDER — SODIUM CHLORIDE 0.9 % IV SOLN
INTRAVENOUS | Status: DC
  Administered 2016-01-16 (×2): via INTRAVENOUS

## 2016-01-16 MED ORDER — LEVOTHYROXINE SODIUM 112 MCG PO TABS
112.0000 ug | ORAL_TABLET | Freq: Every day | ORAL | Status: DC
  Administered 2016-01-16 – 2016-01-19 (×4): 112 ug via ORAL
  Filled 2016-01-16 (×5): qty 1

## 2016-01-16 MED ORDER — MAGNESIUM SULFATE 4 GM/100ML IV SOLN
4.0000 g | Freq: Once | INTRAVENOUS | Status: AC
  Administered 2016-01-16: 4 g via INTRAVENOUS
  Filled 2016-01-16: qty 100

## 2016-01-16 MED ORDER — MAGNESIUM HYDROXIDE 400 MG/5ML PO SUSP: 30.0000 mL | Freq: Every day | ORAL | Status: DC | PRN

## 2016-01-16 MED ORDER — POLYETHYLENE GLYCOL 3350 17 G PO PACK
17.0000 g | PACK | Freq: Every day | ORAL | Status: DC
  Administered 2016-01-16 – 2016-01-19 (×4): 17 g via ORAL
  Filled 2016-01-16 (×4): qty 1

## 2016-01-16 MED ORDER — LINAGLIPTIN 5 MG PO TABS
5.0000 mg | ORAL_TABLET | Freq: Every day | ORAL | Status: DC
  Administered 2016-01-16 – 2016-01-19 (×4): 5 mg via ORAL
  Filled 2016-01-16 (×4): qty 1

## 2016-01-16 MED ORDER — ENSURE PUDDING PO PUDG: 1.0000 | Freq: Two times a day (BID) | ORAL | Status: DC

## 2016-01-16 MED ORDER — GABAPENTIN 300 MG PO CAPS
300.0000 mg | ORAL_CAPSULE | Freq: Three times a day (TID) | ORAL | Status: DC
  Administered 2016-01-16 – 2016-01-19 (×10): 300 mg via ORAL
  Filled 2016-01-16 (×12): qty 1

## 2016-01-16 MED ORDER — IPRATROPIUM-ALBUTEROL 0.5-2.5 (3) MG/3ML IN SOLN: 3.0000 mL | Freq: Four times a day (QID) | RESPIRATORY_TRACT | Status: DC

## 2016-01-16 MED ORDER — LEVALBUTEROL HCL 0.63 MG/3ML IN NEBU
0.6300 mg | INHALATION_SOLUTION | Freq: Two times a day (BID) | RESPIRATORY_TRACT | Status: DC
  Administered 2016-01-16 – 2016-01-19 (×5): 0.63 mg via RESPIRATORY_TRACT
  Filled 2016-01-16 (×6): qty 3

## 2016-01-16 MED ORDER — QUETIAPINE FUMARATE 25 MG PO TABS
25.0000 mg | ORAL_TABLET | Freq: Every day | ORAL | Status: DC
  Administered 2016-01-16 – 2016-01-18 (×3): 25 mg via ORAL
  Filled 2016-01-16 (×4): qty 1

## 2016-01-16 MED ORDER — IPRATROPIUM BROMIDE 0.02 % IN SOLN
0.5000 mg | Freq: Two times a day (BID) | RESPIRATORY_TRACT | Status: DC
  Administered 2016-01-16 – 2016-01-19 (×5): 0.5 mg via RESPIRATORY_TRACT
  Filled 2016-01-16 (×6): qty 2.5

## 2016-01-16 MED ORDER — IPRATROPIUM BROMIDE 0.02 % IN SOLN
0.5000 mg | Freq: Four times a day (QID) | RESPIRATORY_TRACT | Status: DC
Start: 2016-01-16 — End: 2016-01-16
  Administered 2016-01-16: 0.5 mg via RESPIRATORY_TRACT
  Filled 2016-01-16: qty 2.5

## 2016-01-16 MED ORDER — CYCLOBENZAPRINE HCL 10 MG PO TABS
10.0000 mg | ORAL_TABLET | Freq: Two times a day (BID) | ORAL | Status: DC
  Filled 2016-01-16 (×2): qty 1

## 2016-01-16 MED ORDER — OXYCODONE HCL 5 MG PO TABS
5.0000 mg | ORAL_TABLET | Freq: Four times a day (QID) | ORAL | Status: DC | PRN
  Administered 2016-01-16 – 2016-01-18 (×5): 5 mg via ORAL
  Filled 2016-01-16 (×5): qty 1

## 2016-01-16 MED ORDER — BISACODYL 10 MG RE SUPP: 10.0000 mg | Freq: Every day | RECTAL | Status: DC | PRN

## 2016-01-16 MED ORDER — ASPIRIN EC 81 MG PO TBEC
81.0000 mg | DELAYED_RELEASE_TABLET | Freq: Every morning | ORAL | Status: DC
Start: 2016-01-16 — End: 2016-01-19
  Administered 2016-01-16 – 2016-01-19 (×4): 81 mg via ORAL
  Filled 2016-01-16 (×4): qty 1

## 2016-01-16 MED ORDER — LORAZEPAM 0.5 MG PO TABS
0.5000 mg | ORAL_TABLET | Freq: Three times a day (TID) | ORAL | Status: DC
  Administered 2016-01-16 – 2016-01-19 (×10): 0.5 mg via ORAL
  Filled 2016-01-16 (×10): qty 1

## 2016-01-16 MED ORDER — PANTOPRAZOLE SODIUM 40 MG PO TBEC
40.0000 mg | DELAYED_RELEASE_TABLET | Freq: Every day | ORAL | Status: DC
  Administered 2016-01-16 – 2016-01-19 (×4): 40 mg via ORAL
  Filled 2016-01-16 (×5): qty 1

## 2016-01-16 MED ORDER — ONDANSETRON HCL 4 MG PO TABS: 4.0000 mg | ORAL_TABLET | Freq: Four times a day (QID) | ORAL | Status: DC | PRN

## 2016-01-16 NOTE — Progress Notes (Addendum)
Progress Note   [name] [id number] DOB: 10-23-1929 DOA: 01/15/2016 PCP: [name], MD   Brief Narrative:   [name] is an 82 y.o. male with a PMH of COPD, PAD, type 2 diabetes, aortic stenosis and bipolar disorder who was admitted with a 2 day history of confusion/altered mental status after completing a course of Levaquin for treatment of pneumonia. Upon initial evaluation in the ED, WBC was 18.7, magnesium was 1.0, and a chest x-ray showed a left lower lobe infiltrate.  Assessment/Plan:   Principal Problems:   Sepsis secondary to HCAP (healthcare-associated pneumonia) Sepsis - Met 2 or more SIRS criteria (Temp <96.8; RR >20; WBC >12K) AND had evidence of acute organ failure (lactic acidosis, oliguria, ALI, ARDS, coagulopathy/DIC, AMS, hypotension/shock) - This patient is/is not at high risk of poor outcomes with a SOFA score of 2 (at least 2 of the following clinical criteria: respiratory rate of 22/min or greater, altered mentation, or systolic blood pressure of 100 mm Hg or less. - Source thought to be from HCAP.  - Continue Maxipime and vancomycin for healthcare associated pathogens. - Follow-up blood cultures, sputum culture and strep pneumonia antigen. - Hour oral antibiotics when culture data finalized. - Check influenza panel since he just completed a course of antibiotics.    COPD with acute exacerbation (HCC) - S/P 1 dose of Solu-Medrol.  Continue bronchodilators and supplemental oxygen.    Hypomagnesemia - Supplemented with 6 g of magnesium.  Active Problems:   Normocytic anemia - Likely anemia of chronic disease. No current indication for transfusion.    Hypothyroidism - Continue Synthroid.    GERD - Continue Protonix.    Anxiety disorder - Continue Ativan.    Atherosclerosis of native arteries of the extremities with ulceration (HCC)/peripheral artery disease - Continue aspirin and statin.    DM2 (diabetes mellitus, type 2)  (HCC) with stage III chronic kidney disease - Continue Trajenta.    Aortic stenosis - Monitor for signs of fluid overload.    Bipolar disorder ([place]) - Continue Seroquel and Zoloft.    Altered mental status - Hold Flexeril.    Lower extremity wounds in the setting of peripheral arterial disease - Wound care nurse to evaluate.    DVT Prophylaxis - Lovenox ordered.   Family Communication/Anticipated D/C date and plan/Code Status   Family Communication: No family at the bedside. Lives with wife of 64 years. Disposition Plan: Home versus SNF when stable (Just released from [facility]). Anticipated D/C date:   01/19/16 if mental status improved and he remains hemodynamically stable, pending culture results. Code Status: Full code.   IV Access:    Peripheral IV   Procedures and diagnostic studies:   Dg Chest 2 View  01/15/2016  CLINICAL DATA:  Altered mental status. Recent treatment for pneumonia. Productive cough. History of COPD and hypertension. EXAM: CHEST  2 VIEW COMPARISON:  07/10/2015 FINDINGS: Cardiac enlargement with mild pulmonary vascular congestion. Interstitial changes in the lung bases likely representing mild edema. There is superimposed airspace disease in the left lung base with small left pleural effusion likely representing superimposed pneumonia. No pneumothorax. Calcified and tortuous aorta. Degenerative changes in the spine and shoulders. IMPRESSION: Cardiac enlargement with pulmonary vascular congestion and mild interstitial edema. Superimposed airspace consolidation in the left lung base with small left pleural effusion likely representing pneumonia. Electronically Signed   By: Lucienne Capers M.D.   On: 01/15/2016 21:14     Medical Consultants:  None.  Anti-Infectives:   Cefepime 01/15/16---> Vancomycin 01/15/16--->   Subjective:   [name] denies dyspnea, has a cough productive of yellow mucous.  Denies N/V.  Says his appetite is "OK".   No diarrhea, some constipation.  Objective:    Filed Vitals:   01/15/16 2330 01/15/16 2331 01/16/16 0049 01/16/16 0527  BP: 129/61 129/61 125/58 139/60  Pulse:  79 84 90  Temp:   97.7 F (36.5 C) 97.5 F (36.4 C)  TempSrc:   Oral Oral  Resp: '15 18 18 18  ' Height:      Weight:   93.3 kg (205 lb 11 oz)   SpO2:  93% 95% 93%    Intake/Output Summary (Last 24 hours) at 01/16/16 0714 Last data filed at 01/16/16 0086  Gross per 24 hour  Intake      0 ml  Output    300 ml  Net   -300 ml   [facility] Weights   01/15/16 2137 01/16/16 0049  Weight: 72.576 kg (160 lb) 93.3 kg (205 lb 11 oz)    Exam: Gen:  NAD Cardiovascular:  RRR, IV/VI SEM Respiratory:  Lungs CTAB Gastrointestinal:  Abdomen soft, NT/ND, + BS Extremities:  3+ edema, LE wounds wrapped in Unna boots   Data Reviewed:    Labs: Basic Metabolic Panel:  Recent Labs Lab 01/15/16 2106 01/16/16 0153  NA 138 137  K 4.1 3.9  CL 101 100*  CO2 28 28  GLUCOSE 133* 146*  BUN 37* 32*  CREATININE 1.35* 1.29*  CALCIUM 8.0* 8.1*  MG 1.0*  --   PHOS 3.5  --    GFR Estimated Creatinine Clearance: 49.7 mL/min (by C-G formula based on Cr of 1.29). Liver Function Tests:  Recent Labs Lab 01/15/16 2106 01/16/16 0153  AST 22 19  ALT 10* 10*  ALKPHOS 96 98  BILITOT 0.7 1.1  PROT 6.2* 6.2*  ALBUMIN 2.3* 2.4*    CBC:  Recent Labs Lab 01/15/16 2106 01/16/16 0153  WBC 18.7* 15.4*  NEUTROABS 15.8* 14.5*  HGB 9.9* 10.1*  HCT 31.0* 31.3*  MCV 80.7 80.5  PLT 230 231   Cardiac Enzymes: No results for input(s): CKTOTAL, CKMB, CKMBINDEX, TROPONINI in the last 168 hours. BNP (last 3 results) No results for input(s): PROBNP in the last 8760 hours. CBG: No results for input(s): GLUCAP in the last 168 hours. D-Dimer: No results for input(s): DDIMER in the last 72 hours. Hgb A1c: No results for input(s): HGBA1C in the last 72 hours. Lipid Profile: No results for input(s): CHOL, HDL, LDLCALC, TRIG, CHOLHDL,  LDLDIRECT in the last 72 hours. Thyroid function studies: No results for input(s): TSH, T4TOTAL, T3FREE, THYROIDAB in the last 72 hours.  Invalid input(s): FREET3 Anemia work up: No results for input(s): VITAMINB12, FOLATE, FERRITIN, TIBC, IRON, RETICCTPCT in the last 72 hours. Sepsis Labs:  Recent Labs Lab 01/15/16 2106 01/16/16 0153  WBC 18.7* 15.4*   Microbiology No results found for this or any previous visit (from the past 240 hour(s)).   Medications:   . aspirin EC  81 mg Oral q morning - 10a  . carbidopa-levodopa  1 tablet Oral QPM  . ceFEPime (MAXIPIME) IV  2 g Intravenous Q24H  . cyclobenzaprine  10 mg Oral BID  . diltiazem  360 mg Oral Daily  . enoxaparin (LOVENOX) injection  40 mg Subcutaneous Q24H  . fentaNYL  50 mcg Transdermal Q3 days  . gabapentin  300 mg Oral TID  . guaiFENesin  600  mg Oral BID  . ipratropium  0.5 mg Nebulization Q6H WA  . levalbuterol  0.63 mg Nebulization Q6H WA  . levothyroxine  112 mcg Oral QAC breakfast  . linagliptin  5 mg Oral Daily  . LORazepam  0.5 mg Oral TID  . pantoprazole  40 mg Oral Daily  . QUEtiapine  25 mg Oral QHS  . sertraline  100 mg Oral QPM  . simvastatin  10 mg Oral Daily  . sodium chloride flush  3 mL Intravenous Q12H  . vancomycin  1,000 mg Intravenous Q24H   Continuous Infusions: . sodium chloride 50 mL/hr at 01/16/16 0129    Time spent: 35 minutes.  The patient is medically complex with multiple co-morbidities and is at high risk for clinical deterioration and requires high complexity decision making.    LOS: 1 day   RAMA,CHRISTINA  [facility] Pager [phone]. If unable to reach me by pager, please call my cell phone at [phone].  *Please refer to [email or web address], password [email or web address] to get updated schedule on who will round on this patient, as hospitalists switch teams weekly. If 7PM-7AM, please contact night-coverage at [email or web address], password [email or web address] for any overnight needs.  01/16/2016, 7:14 AM

## 2016-01-16 NOTE — Evaluation (Signed)
Clinical/Bedside Swallow Evaluation Patient Details  Name: [name] MRN: [id number] Date of Birth: [birth year]  Today's Date: 01/16/2016 Time: SLP Start Time (ACUTE ONLY): 69 SLP Stop Time (ACUTE ONLY): 1423 SLP Time Calculation (min) (ACUTE ONLY): 22 min  Past Medical History:  Past Medical History  Diagnosis Date  . Hyperlipidemia   . COPD (chronic obstructive pulmonary disease) ([place])   . Leg pain   . GERD (gastroesophageal reflux disease)   . Depression   . Dizziness   . Productive cough   . Thyroid disease   . Peripheral vascular disease ([place])   . ED (erectile dysfunction)   . Hypertension     08/17/12 - wife denies  . Walking pneumonia 2000's  . H/O hiatal hernia   . Migraine     "when I was a young boy"  . Arthritis     "hands" (08/28/2014)  . Anxiety   . Hypothyroidism   . Chronic kidney disease (CKD), stage III (moderate)     [name] 08/28/2014   Past Surgical History:  Past Surgical History  Procedure Laterality Date  . Renal artery stent Left   . Kidney stone surgery  1989  . Lower extremity angiogram Left 03-20-2014    Dr. Tinnie Gens  . Tonsillectomy    . Cataract extraction, bilateral Bilateral   . Blepharoplasty Right   . Abdominal aortagram N/A 03/20/2013    Procedure: ABDOMINAL Maxcine Ham;  Surgeon: Bruce Misty, MD;  Location: [facility] CATH LAB;  Service: Cardiovascular;  Laterality: N/A;  . Lower extremity angiogram Left 03/20/2013    Procedure: LOWER EXTREMITY ANGIOGRAM;  Surgeon: Bruce Misty, MD;  Location: [facility] CATH LAB;  Service: Cardiovascular;  Laterality: Left;   HPI:  82 y.o. male with below past medical history who is brought by EMS from [place] upon leaving the rehabilitation facility with a history of altered mental status; CXR on 01/15/16 indicated cardiac enlargement with pulmonary vascular congestion and mild interstitial edema. Superimposed airspace consolidation in the left lung base with small left pleural effusion likely representing  pneumonia  Assessment / Plan / Recommendation Clinical Impression                                       Limited BSE, with pt not exhibiting any overt s/s of aspiration with consistencies including thin via cup/straw and puree consistency; he refused solids d/t "pain with swallowing" intermittently despite coaxing/encouragement to swallow modified amounts of solids; he indicated esophageal symptoms including "heartburn" and globus sensation, but stated he had "tests to see about these things."  Esophageal assessment may be warranted if pt has not had testing related to esophageal symptoms as pt was minimally confused during BSE and was not oriented to time or situation, but place and self only. ST to f/u x1 to assess diet tolerance x1 and compensatory strategies if needed during meals.  Aspiration Risk    None   Diet Recommendation   Regular/thin as tolerated d/t limited BSE; soft diet may be needed for pt comfort  Medication Administration: Whole meds with liquid    Other  Recommendations Oral Care Recommendations: Oral care BID   Follow up Recommendations    n/a   Frequency and Duration min 1 x/week  1 week       Prognosis Prognosis for Safe Diet Advancement: Good      Swallow Study   General Date of Onset: 01/15/16  HPI: 82 y.o. male with below past medical history who is brought by EMS from [name] upon leaving the rehabilitation facility with a history of altered mental status.  Type of Study: Bedside Swallow Evaluation Diet Prior to this Study: Regular;Thin liquids Temperature Spikes Noted: No Respiratory Status: Nasal cannula History of Recent Intubation: No Behavior/Cognition: Alert;Cooperative Oral Cavity Assessment: Within Functional Limits Oral Care Completed by SLP: No Oral Cavity - Dentition: Missing dentition (Good condition) Vision: Functional for self-feeding Self-Feeding Abilities: Able to feed self Patient Positioning: Upright in bed Baseline Vocal Quality: Low  vocal intensity Volitional Cough: Strong Volitional Swallow: Able to elicit    Oral/Motor/Sensory Function Overall Oral Motor/Sensory Function: Within functional limits   Ice Chips Ice chips: Not tested   Thin Liquid Thin Liquid: Within functional limits Presentation: Cup;Straw    Nectar Thick Nectar Thick Liquid: Not tested   Honey Thick Honey Thick Liquid: Not tested   Puree Puree: Within functional limits Presentation: Self Fed   Solid      Solid: Not tested (Pt refused d/t pain with swallowing some solids)        [name], M.S., CCC-SLP 01/16/2016,2:36 PM

## 2016-01-17 DIAGNOSIS — N179 Acute kidney failure, unspecified: Secondary | ICD-10-CM | POA: Diagnosis present

## 2016-01-17 DIAGNOSIS — F411 Generalized anxiety disorder: Secondary | ICD-10-CM

## 2016-01-17 LAB — CBC
HCT: 30.8 % — ABNORMAL LOW (ref 39.0–52.0)
HEMOGLOBIN: 9.8 g/dL — AB (ref 13.0–17.0)
MCH: 25.9 pg — AB (ref 26.0–34.0)
MCHC: 31.8 g/dL (ref 30.0–36.0)
MCV: 81.5 fL (ref 78.0–100.0)
Platelets: 255 10*3/uL (ref 150–400)
RBC: 3.78 MIL/uL — AB (ref 4.22–5.81)
RDW: 16.1 % — ABNORMAL HIGH (ref 11.5–15.5)
WBC: 21.1 10*3/uL — ABNORMAL HIGH (ref 4.0–10.5)

## 2016-01-17 LAB — BASIC METABOLIC PANEL
ANION GAP: 8 (ref 5–15)
BUN: 26 mg/dL — AB (ref 6–20)
CALCIUM: 8.7 mg/dL — AB (ref 8.9–10.3)
CO2: 30 mmol/L (ref 22–32)
CREATININE: 1.04 mg/dL (ref 0.61–1.24)
Chloride: 103 mmol/L (ref 101–111)
GFR calc Af Amer: >60 mL/min (ref >60)
GLUCOSE: 106 mg/dL — AB (ref 65–99)
Potassium: 4.4 mmol/L (ref 3.5–5.1)
SODIUM: 141 mmol/L (ref 135–145)

## 2016-01-17 LAB — GLUCOSE, CAPILLARY
GLUCOSE-CAPILLARY: 93 mg/dL (ref 65–99)
Glucose-Capillary: 100 mg/dL — ABNORMAL HIGH (ref 65–99)
Glucose-Capillary: 86 mg/dL (ref 65–99)
Glucose-Capillary: 102 mg/dL — ABNORMAL HIGH (ref 65–99)

## 2016-01-17 MED ORDER — MAGNESIUM SULFATE 2 GM/50ML IV SOLN
2.0000 g | Freq: Once | INTRAVENOUS | Status: AC
  Administered 2016-01-17: 2 g via INTRAVENOUS
  Filled 2016-01-17: qty 50

## 2016-01-17 MED ORDER — VANCOMYCIN HCL IN DEXTROSE 1-5 GM/200ML-% IV SOLN
1000.0000 mg | Freq: Two times a day (BID) | INTRAVENOUS | Status: DC
  Administered 2016-01-17 – 2016-01-19 (×4): 1000 mg via INTRAVENOUS
  Filled 2016-01-17 (×5): qty 200

## 2016-01-17 MED ORDER — DEXTROSE 5 % IV SOLN
2.0000 g | Freq: Three times a day (TID) | INTRAVENOUS | Status: DC
  Administered 2016-01-17 – 2016-01-19 (×6): 2 g via INTRAVENOUS
  Filled 2016-01-17 (×7): qty 2

## 2016-01-17 NOTE — Consult Note (Signed)
WOC wound consult note Reason for Consult:Patient seen per MD request for assessment and suggestions for care of bilateral LEs.  Currently, patient is wearing bilateral dry boots: gauze, ABD pads and Coban wrapped from metatarsal head to knee. Patient states that boots are changed once weekly on Sundays. Wound type:Venous insufficiency, chronic. Neuropathy. Pressure Ulcer POA: Yes/No Measurement: Partial thickness tissue loss at the medial left LE in a 4cm x 4cm area.  Neuropathic ulcer (full thickness) on the plantar aspect of the right heel measuring 2cm x 3cm x 0.4cm.  Ruddy red wound base. Minimal serous exudate. Wound bed:As described above. Drainage (amount, consistency, odor) As described above. Periwound:bilateral hemosiderin staining. Dressing procedure/placement/frequency: I have provided Nursing orders for topical wound care to be performed twice weekly on Sundays and Wednesdays. [employer] application will follow that wound care.  Following wound care and while patient is in bed, feet are to be placed into bilateral pressure redistribution heel boots. [place] nursing team will not follow, but will remain available to this patient, the nursing and medical teams.  Please re-consult if needed. Thanks, Maudie Flakes, MSN, RN, [place], Arther Abbott  Pager# [phone]

## 2016-01-17 NOTE — Progress Notes (Signed)
Progress Note   [name] [id number] DOB: 07-27-1929 DOA: 01/15/2016 PCP: [name], MD   Brief Narrative:   [name] is an 82 y.o. male with a PMH of COPD, PAD, type 2 diabetes, aortic stenosis and bipolar disorder who was admitted with a 2 day history of confusion/altered mental status after completing a course of Levaquin for treatment of pneumonia. Upon initial evaluation in the ED, WBC was 18.7, magnesium was 1.0, and a chest x-ray showed a left lower lobe infiltrate.  Assessment/Plan:   Principal Problems:   Sepsis secondary to HCAP (healthcare-associated pneumonia) - Source thought to be from HCAP.  - Continue Maxipime and vancomycin for healthcare associated pathogens. - Follow-up blood cultures, sputum culture and strep pneumonia antigen. - Influenza panel negative.    COPD with acute exacerbation (HCC) - S/P 1 dose of Solu-Medrol.  Continue bronchodilators and supplemental oxygen.    Hypomagnesemia - Supplemented with 6 g of magnesium. We'll give an additional 2 g today.    Acute kidney injury - Creatinine improved with IV fluids.  Active Problems:   Normocytic anemia - Likely anemia of chronic disease. No current indication for transfusion.    Hypothyroidism - Continue Synthroid.    GERD - Continue Protonix.    Anxiety disorder - Continue Ativan.    Atherosclerosis of native arteries of the extremities with ulceration (HCC)/peripheral artery disease - Continue aspirin and statin. - No need to continue telemetry monitoring.    DM2 (diabetes mellitus, type 2) (HCC) with stage III chronic kidney disease - Continue Trajenta. CBGs 100-172.    Aortic stenosis - Monitor for signs of fluid overload.    Bipolar disorder ([place]) - Continue Seroquel and Zoloft.    Altered mental status - Hold Flexeril.    Lower extremity wounds in the setting of peripheral arterial disease and edema - Wound care nurse to evaluate. Continue Unna  boots.    DVT Prophylaxis - Lovenox ordered.   Family Communication/Anticipated D/C date and plan/Code Status   Family Communication: [name] (wife) updated by telephone. Disposition Plan: Home versus SNF when stable (Just released from [facility]). Anticipated D/C date:   01/19/16 if cultures back and WBC improves. Code Status: Full code.   IV Access:    Peripheral IV   Procedures and diagnostic studies:   Dg Chest 2 View  01/15/2016  CLINICAL DATA:  Altered mental status. Recent treatment for pneumonia. Productive cough. History of COPD and hypertension. EXAM: CHEST  2 VIEW COMPARISON:  07/10/2015 FINDINGS: Cardiac enlargement with mild pulmonary vascular congestion. Interstitial changes in the lung bases likely representing mild edema. There is superimposed airspace disease in the left lung base with small left pleural effusion likely representing superimposed pneumonia. No pneumothorax. Calcified and tortuous aorta. Degenerative changes in the spine and shoulders. IMPRESSION: Cardiac enlargement with pulmonary vascular congestion and mild interstitial edema. Superimposed airspace consolidation in the left lung base with small left pleural effusion likely representing pneumonia. Electronically Signed   By: Lucienne Capers M.D.   On: 01/15/2016 21:14     Medical Consultants:    None.  Anti-Infectives:   Cefepime 01/15/16---> Vancomycin 01/15/16--->   Subjective:   [name] denies dyspnea, continues to have a cough productive of yellow mucous.  Denies N/V. Appetite good.  No diarrhea, some constipation. No BM in 2-3 days.    Objective:    Filed Vitals:   01/16/16 1500 01/16/16 2046 01/16/16 2205 01/17/16 0500  BP: 138/72  151/60  133/65  Pulse: 88  97 85  Temp: 98.5 F (36.9 C)  97.3 F (36.3 C) 97.8 F (36.6 C)  TempSrc: Oral  Oral Oral  Resp:   18 16  Height:      Weight:      SpO2:  98% 95%     Intake/Output Summary (Last 24 hours) at 01/17/16  0812 Last data filed at 01/17/16 0500  Gross per 24 hour  Intake    100 ml  Output    850 ml  Net   -750 ml   [facility] Weights   01/15/16 2137 01/16/16 0049  Weight: 72.576 kg (160 lb) 93.3 kg (205 lb 11 oz)    Exam: Gen:  NAD Cardiovascular:  RRR, IV/VI SEM Respiratory:  Lungs CTAB Gastrointestinal:  Abdomen soft, NT/ND, + BS Extremities: 3+ bilateral foot swelling, unna boots on   Data Reviewed:    Labs: Basic Metabolic Panel:  Recent Labs Lab 01/15/16 2106 01/16/16 0153 01/17/16 0524  NA 138 137 141  K 4.1 3.9 4.4  CL 101 100* 103  CO2 28 28 30   GLUCOSE 133* 146* 106*  BUN 37* 32* 26*  CREATININE 1.35* 1.29* 1.04  CALCIUM 8.0* 8.1* 8.7*  MG 1.0* 1.6*  --   PHOS 3.5  --   --    GFR Estimated Creatinine Clearance: 61.6 mL/min (by C-G formula based on Cr of 1.04). Liver Function Tests:  Recent Labs Lab 01/15/16 2106 01/16/16 0153  AST 22 19  ALT 10* 10*  ALKPHOS 96 98  BILITOT 0.7 1.1  PROT 6.2* 6.2*  ALBUMIN 2.3* 2.4*    CBC:  Recent Labs Lab 01/15/16 2106 01/16/16 0153 01/17/16 0524  WBC 18.7* 15.4* 21.1*  NEUTROABS 15.8* 14.5*  --   HGB 9.9* 10.1* 9.8*  HCT 31.0* 31.3* 30.8*  MCV 80.7 80.5 81.5  PLT 230 231 255   CBG:  Recent Labs Lab 01/16/16 0736 01/16/16 1258 01/16/16 1644 01/16/16 2159 01/17/16 0758  GLUCAP 154* 172* 123* 113* 100*   Sepsis Labs:  Recent Labs Lab 01/15/16 2106 01/16/16 0153 01/17/16 0524  WBC 18.7* 15.4* 21.1*   Microbiology Recent Results (from the past 240 hour(s))  Culture, blood (routine x 2) Call MD if unable to obtain prior to antibiotics being given     Status: None (Preliminary result)   Collection Time: 01/16/16  1:50 AM  Result Value Ref Range Status   Specimen Description   Final    BLOOD LEFT ANTECUBITAL Performed at [employer]  Final   Culture PENDING  Incomplete   Report Status PENDING  Incomplete  Culture,  blood (routine x 2) Call MD if unable to obtain prior to antibiotics being given     Status: None (Preliminary result)   Collection Time: 01/16/16  1:53 AM  Result Value Ref Range Status   Specimen Description BLOOD BLOOD LEFT FOREARM  Final   Special Requests   Final    BOTTLES DRAWN AEROBIC ONLY [place] Performed at [facility]    Culture PENDING  Incomplete   Report Status PENDING  Incomplete  Culture, sputum-assessment     Status: None   Collection Time: 01/16/16 10:11 PM  Result Value Ref Range Status   Specimen Description SPUTUM  Final   Special Requests NONE  Final   Sputum evaluation   Final    THIS SPECIMEN IS ACCEPTABLE. RESPIRATORY CULTURE REPORT TO FOLLOW.   Report  Status 01/16/2016 FINAL  Final     Medications:   . aspirin EC  81 mg Oral q morning - 10a  . carbidopa-levodopa  1 tablet Oral QPM  . ceFEPime (MAXIPIME) IV  2 g Intravenous Q24H  . diltiazem  360 mg Oral Daily  . enoxaparin (LOVENOX) injection  40 mg Subcutaneous Q24H  . fentaNYL  50 mcg Transdermal Q3 days  . gabapentin  300 mg Oral TID  . guaiFENesin  600 mg Oral BID  . ipratropium  0.5 mg Nebulization BID  . levalbuterol  0.63 mg Nebulization BID  . levothyroxine  112 mcg Oral QAC breakfast  . linagliptin  5 mg Oral Daily  . LORazepam  0.5 mg Oral TID  . pantoprazole  40 mg Oral Daily  . polyethylene glycol  17 g Oral Daily  . QUEtiapine  25 mg Oral QHS  . sertraline  100 mg Oral QPM  . simvastatin  10 mg Oral Daily  . sodium chloride flush  3 mL Intravenous Q12H  . vancomycin  1,000 mg Intravenous Q24H   Continuous Infusions:    Time spent: 25 minutes.    LOS: 2 days   RAMA,CHRISTINA  [facility] Pager [phone]. If unable to reach me by pager, please call my cell phone at [phone].  *Please refer to [email or web address], password [email or web address] to get updated schedule on who will round on this patient, as hospitalists switch teams weekly. If 7PM-7AM, please contact night-coverage at  [email or web address], password [email or web address] for any overnight needs.  01/17/2016, 8:12 AM

## 2016-01-17 NOTE — Progress Notes (Signed)
Pharmacy Antibiotic Follow-up Note  [name] is a 82 y.o. year-old male admitted on 01/15/2016.  The patient is currently on day 2 of vancomycin and cefepime for HAP.  Renal function has improved significantly since admission.  - afeb, wbc up 21.1, scr trending down 1.04 (crcl~61) - 1/27 CXR: suspect PNA   Assessment/Plan: - increase Cefepime to 2g IV q8h - increase Vancomycin to 1gm IV q12h - f/u cultures   Temp (24hrs), Avg:97.9 F (36.6 C), Min:97.3 F (36.3 C), Max:98.5 F (36.9 C)   Recent Labs Lab 01/15/16 2106 01/16/16 0153 01/17/16 0524  WBC 18.7* 15.4* 21.1*    Recent Labs Lab 01/15/16 2106 01/16/16 0153 01/17/16 0524  CREATININE 1.35* 1.29* 1.04   Estimated Creatinine Clearance: 61.6 mL/min (by C-G formula based on Cr of 1.04).    No Known Allergies  Antimicrobials this admission: 1/27 Vanc >> 1/27 Cefepime >>  Microbiology results: 1/28 bcx x2:ngtd 1/28 sputum: neg FINAL 1/28 strep (-)   Thank you for allowing pharmacy to be a part of this patient's care.  Lynelle Doctor PharmD 01/17/2016 11:19 AM

## 2016-01-18 DIAGNOSIS — E1140 Type 2 diabetes mellitus with diabetic neuropathy, unspecified: Secondary | ICD-10-CM | POA: Diagnosis present

## 2016-01-18 DIAGNOSIS — E1142 Type 2 diabetes mellitus with diabetic polyneuropathy: Secondary | ICD-10-CM

## 2016-01-18 DIAGNOSIS — I872 Venous insufficiency (chronic) (peripheral): Secondary | ICD-10-CM | POA: Diagnosis present

## 2016-01-18 LAB — GLUCOSE, CAPILLARY
GLUCOSE-CAPILLARY: 67 mg/dL (ref 65–99)
GLUCOSE-CAPILLARY: 73 mg/dL (ref 65–99)
Glucose-Capillary: 54 mg/dL — ABNORMAL LOW (ref 65–99)
Glucose-Capillary: 84 mg/dL (ref 65–99)

## 2016-01-18 LAB — CBC
HCT: 31.8 % — ABNORMAL LOW (ref 39.0–52.0)
HEMOGLOBIN: 10 g/dL — AB (ref 13.0–17.0)
MCH: 26.3 pg (ref 26.0–34.0)
MCHC: 31.4 g/dL (ref 30.0–36.0)
MCV: 83.7 fL (ref 78.0–100.0)
PLATELETS: 231 10*3/uL (ref 150–400)
RBC: 3.8 MIL/uL — ABNORMAL LOW (ref 4.22–5.81)
RDW: 16.4 % — AB (ref 11.5–15.5)
WBC: 15 10*3/uL — ABNORMAL HIGH (ref 4.0–10.5)

## 2016-01-18 LAB — BASIC METABOLIC PANEL
Anion gap: 8 (ref 5–15)
BUN: 24 mg/dL — AB (ref 6–20)
CALCIUM: 8.8 mg/dL — AB (ref 8.9–10.3)
CO2: 31 mmol/L (ref 22–32)
CREATININE: 1.21 mg/dL (ref 0.61–1.24)
Chloride: 102 mmol/L (ref 101–111)
GFR calc Af Amer: >60 mL/min (ref >60)
GFR, EST NON AFRICAN AMERICAN: 53 mL/min — AB (ref >60)
GLUCOSE: 101 mg/dL — AB (ref 65–99)
POTASSIUM: 4.5 mmol/L (ref 3.5–5.1)
SODIUM: 141 mmol/L (ref 135–145)

## 2016-01-18 LAB — MAGNESIUM: MAGNESIUM: 1.7 mg/dL (ref 1.7–2.4)

## 2016-01-18 MED ORDER — BISACODYL 10 MG RE SUPP
10.0000 mg | Freq: Once | RECTAL | Status: DC
  Filled 2016-01-18: qty 1

## 2016-01-18 NOTE — Progress Notes (Signed)
Progress Note   [name] [id number] DOB: March 30, 1929 DOA: 01/15/2016 PCP: [name], MD   Brief Narrative:   [name] is an 82 y.o. male with a PMH of COPD, PAD, type 2 diabetes, aortic stenosis and bipolar disorder who was admitted with a 2 day history of confusion/altered mental status after completing a course of Levaquin for treatment of pneumonia. Upon initial evaluation in the ED, WBC was 18.7, magnesium was 1.0, and a chest x-ray showed a left lower lobe infiltrate.  Assessment/Plan:   Principal Problems:   Sepsis secondary to HCAP (healthcare-associated pneumonia) - Source thought to be from HCAP.  - Continue Maxipime and vancomycin for healthcare associated pathogens. - Strep pneumonia antigen negative. - Follow-up blood cultures, sputum culture. - Influenza panel negative.    COPD with acute exacerbation (HCC) - S/P 1 dose of Solu-Medrol.  Continue bronchodilators and supplemental oxygen.    Hypomagnesemia - Resolved with supplementation.    Acute kidney injury - Creatinine improved with IV fluids.  Active Problems:   Normocytic anemia - Likely anemia of chronic disease. No current indication for transfusion.    Hypothyroidism - Continue Synthroid.    GERD - Continue Protonix.    Anxiety disorder - Continue Ativan.    Atherosclerosis of native arteries of the extremities with ulceration (HCC)/peripheral artery disease - Continue aspirin and statin. - No need to continue telemetry monitoring.    DM2 (diabetes mellitus, type 2) (HCC) with stage III chronic kidney disease and peripheral neuropathy - Continue Trajenta. CBGs controlled, 86-113. - Continue neurontin for neuropathy.    Aortic stenosis - Monitor for signs of fluid overload.    Bipolar disorder ([place]) - Continue Seroquel and Zoloft.    Altered mental status - Hold Flexeril.    Lower extremity venous insufficiency wounds in the setting of peripheral arterial  disease and edema - Wound care nurse to evaluate. Continue Unna boots.    DVT Prophylaxis - Lovenox ordered.   Family Communication/Anticipated D/C date and plan/Code Status   Family Communication: [name] (wife) updated by telephone. Disposition Plan: Home versus SNF when stable (Just released from [facility]). PT eval pending. Anticipated D/C date:   01/19/16 if cultures back and WBC improves. Code Status: Full code.   IV Access:    Peripheral IV   Procedures and diagnostic studies:   Dg Chest 2 View  01/15/2016  CLINICAL DATA:  Altered mental status. Recent treatment for pneumonia. Productive cough. History of COPD and hypertension. EXAM: CHEST  2 VIEW COMPARISON:  07/10/2015 FINDINGS: Cardiac enlargement with mild pulmonary vascular congestion. Interstitial changes in the lung bases likely representing mild edema. There is superimposed airspace disease in the left lung base with small left pleural effusion likely representing superimposed pneumonia. No pneumothorax. Calcified and tortuous aorta. Degenerative changes in the spine and shoulders. IMPRESSION: Cardiac enlargement with pulmonary vascular congestion and mild interstitial edema. Superimposed airspace consolidation in the left lung base with small left pleural effusion likely representing pneumonia. Electronically Signed   By: Lucienne Capers M.D.   On: 01/15/2016 21:14     Medical Consultants:    None.  Anti-Infectives:   Cefepime 01/15/16---> Vancomycin 01/15/16--->   Subjective:   [name] denies dyspnea.  Complains of leg pain.  Unsure if his bowels moved.  No BMs documented by nursing staff. No N/V.  Objective:    Filed Vitals:   01/17/16 0500 01/17/16 1500 01/17/16 2241 01/18/16 0453  BP: 133/65 118/59 109/56 131/57  Pulse: 85 58 84 89  Temp: 97.8 F (36.6 C) 97.3 F (36.3 C) 97.5 F (36.4 C) 97.7 F (36.5 C)  TempSrc: Oral Oral Oral Oral  Resp: 16 14 16 16   Height:      Weight:        SpO2:  92% 91% 94%    Intake/Output Summary (Last 24 hours) at 01/18/16 0729 Last data filed at 01/18/16 0644  Gross per 24 hour  Intake    480 ml  Output   1225 ml  Net   -745 ml   Filed Weights   01/15/16 2137 01/16/16 0049  Weight: 72.576 kg (160 lb) 93.3 kg (205 lb 11 oz)    Exam: Gen:  NAD Cardiovascular:  RRR, IV/VI SEM Respiratory:  Lungs CTAB Gastrointestinal:  Abdomen soft, NT/ND, + BS Extremities: 3+ bilateral foot swelling, unna boots on, wounds as documented by [facility] RN   Data Reviewed:    Labs: Basic Metabolic Panel:  Recent Labs Lab 01/15/16 2106 01/16/16 0153 01/17/16 0524 01/18/16 0448  NA 138 137 141 141  K 4.1 3.9 4.4 4.5  CL 101 100* 103 102  CO2 28 28 30 31   GLUCOSE 133* 146* 106* 101*  BUN 37* 32* 26* 24*  CREATININE 1.35* 1.29* 1.04 1.21  CALCIUM 8.0* 8.1* 8.7* 8.8*  MG 1.0* 1.6*  --  1.7  PHOS 3.5  --   --   --    GFR Estimated Creatinine Clearance: 53 mL/min (by C-G formula based on Cr of 1.21). Liver Function Tests:  Recent Labs Lab 01/15/16 2106 01/16/16 0153  AST 22 19  ALT 10* 10*  ALKPHOS 96 98  BILITOT 0.7 1.1  PROT 6.2* 6.2*  ALBUMIN 2.3* 2.4*    CBC:  Recent Labs Lab 01/15/16 2106 01/16/16 0153 01/17/16 0524 01/18/16 0448  WBC 18.7* 15.4* 21.1* 15.0*  NEUTROABS 15.8* 14.5*  --   --   HGB 9.9* 10.1* 9.8* 10.0*  HCT 31.0* 31.3* 30.8* 31.8*  MCV 80.7 80.5 81.5 83.7  PLT 230 231 255 231   CBG:  Recent Labs Lab 01/16/16 2159 01/17/16 0758 01/17/16 1226 01/17/16 1703 01/17/16 2223  GLUCAP 113* 100* 93 86 102*   Sepsis Labs:  Recent Labs Lab 01/15/16 2106 01/16/16 0153 01/17/16 0524 01/18/16 0448  WBC 18.7* 15.4* 21.1* 15.0*   Microbiology Recent Results (from the past 240 hour(s))  Culture, blood (routine x 2) Call MD if unable to obtain prior to antibiotics being given     Status: None (Preliminary result)   Collection Time: 01/16/16  1:50 AM  Result Value Ref Range Status   Specimen  Description BLOOD LEFT ANTECUBITAL  Final   Special Requests BOTTLES DRAWN AEROBIC AND ANAEROBIC 5CC  Final   Culture   Final    NO GROWTH 1 DAY Performed at [facility]    Report Status PENDING  Incomplete  Culture, blood (routine x 2) Call MD if unable to obtain prior to antibiotics being given     Status: None (Preliminary result)   Collection Time: 01/16/16  1:53 AM  Result Value Ref Range Status   Specimen Description BLOOD BLOOD LEFT FOREARM  Final   Special Requests BOTTLES DRAWN AEROBIC ONLY [place]  Final   Culture   Final    NO GROWTH 1 DAY Performed at [facility]    Report Status PENDING  Incomplete  Culture, sputum-assessment     Status: None   Collection Time: 01/16/16 10:11 PM  Result  Value Ref Range Status   Specimen Description SPUTUM  Final   Special Requests NONE  Final   Sputum evaluation   Final    THIS SPECIMEN IS ACCEPTABLE. RESPIRATORY CULTURE REPORT TO FOLLOW.   Report Status 01/16/2016 FINAL  Final     Medications:   . aspirin EC  81 mg Oral q morning - 10a  . carbidopa-levodopa  1 tablet Oral QPM  . ceFEPime (MAXIPIME) IV  2 g Intravenous Q8H  . diltiazem  360 mg Oral Daily  . enoxaparin (LOVENOX) injection  40 mg Subcutaneous Q24H  . fentaNYL  50 mcg Transdermal Q3 days  . gabapentin  300 mg Oral TID  . guaiFENesin  600 mg Oral BID  . ipratropium  0.5 mg Nebulization BID  . levalbuterol  0.63 mg Nebulization BID  . levothyroxine  112 mcg Oral QAC breakfast  . linagliptin  5 mg Oral Daily  . LORazepam  0.5 mg Oral TID  . pantoprazole  40 mg Oral Daily  . polyethylene glycol  17 g Oral Daily  . QUEtiapine  25 mg Oral QHS  . sertraline  100 mg Oral QPM  . simvastatin  10 mg Oral Daily  . sodium chloride flush  3 mL Intravenous Q12H  . vancomycin  1,000 mg Intravenous Q12H   Continuous Infusions:    Time spent: 25 minutes.    LOS: 3 days   [place] [facility] Pager [phone]. If unable to reach me by pager,  please call my cell phone at [phone].  *Please refer to [email or web address], password [email or web address] to get updated schedule on who will round on this patient, as hospitalists switch teams weekly. If 7PM-7AM, please contact night-coverage at [email or web address], password [email or web address] for any overnight needs.  01/18/2016, 7:29 AM

## 2016-01-18 NOTE — Clinical Social Work Note (Signed)
CSW spoke with [place] in admissions at [facility]. Pt is LTC and able to return when medically stable. CSW will continue to follow for d/c needs.    Cindra Presume, LCSW [phone] Hospital psychiatric & 5E, 5W [id number] Licensed Clinical Social Worker

## 2016-01-18 NOTE — NC FL2 (Signed)
[place] LEVEL OF CARE SCREENING TOOL     IDENTIFICATION  Patient Name: [name] Birthdate: 28-Feb-1929 Sex: male Admission Date (Current Location): 01/15/2016  [facility] and [place] Number:  Herbalist and Address:  [facility],  [place] [address], [place]      Provider Number: [id number]  Attending Physician Name and Address:  Venetia Maxon Rama, MD  Relative Name and Phone Number:       Current Level of Care: Hospital Recommended Level of Care: [place] Prior Approval Number:    Date Approved/Denied:   PASRR Number: [id number]  Discharge Plan: SNF    Current Diagnoses: Patient Active Problem List   Diagnosis Date Noted  . Diabetic neuropathy ([place]) 01/18/2016  . Chronic venous insufficiency 01/18/2016  . Acute kidney injury ([place]) 01/17/2016  . Hypomagnesemia 01/16/2016  . Altered mental status 01/16/2016  . Stage III chronic kidney disease 01/16/2016  . HCAP (healthcare-associated pneumonia) 01/04/2016  . Hypotension 11/23/2015  . Candida rash of groin 09/23/2015  . Bipolar disorder ([place]) 07/14/2015  . Aortic stenosis 07/09/2015  . Chronic diastolic CHF (congestive heart failure) ([place]) 07/09/2015  . Sepsis due to pneumonia ([place]) 07/08/2015  . Acute respiratory failure with hypoxia ([place]) 07/02/2015  . Pulmonary edema 07/02/2015  . Leukocytosis 07/02/2015  . DM2 (diabetes mellitus, type 2) ([place]) 07/02/2015  . Chronic osteomyelitis of right foot ([place]) 11/11/2014  . Beta-hemolytic group B streptococcal sepsis ([place]) 11/11/2014  . Bacteremia 11/11/2014  . Atherosclerotic PVD with ulceration ([place]) 10/28/2014  . Hyperlipidemia 10/07/2014  . Obesity 10/07/2014  . Moderate aortic stenosis 10/07/2014  . Multifocal atrial tachycardia ([place]) 10/06/2014  . Sepsis ([place]) 10/05/2014  . Wound of right leg 08/28/2014  . Foot ulcer ([place]) 01/04/2013  . Cellulitis 01/04/2013  . PAD (peripheral artery disease)  ([place]) 01/04/2013  . Bilateral lower extremity edema 10/23/2012  . Atherosclerosis of native arteries of the extremities with ulceration ([place]) 10/23/2012  . Aspiration pneumonia ([place]) 08/17/2012  . Toxic encephalopathy 08/17/2012  . COPD with acute exacerbation ([place]) 08/17/2012  . Sacral decubitus ulcer 08/17/2012  . Chronic pain 08/17/2012  . Anxiety disorder 08/17/2012  . Chronic kidney disease, stage III (moderate) 08/17/2012  . Hypothyroidism 02/21/2008  . DYSLIPIDEMIA 02/21/2008  . ANXIETY DEPRESSION 02/21/2008  . RHEUMATIC FEVER 02/21/2008  . Hypertensive heart disease with CHF ([place]) 02/21/2008  . COPD 02/21/2008  . ESOPHAGEAL MOTILITY DISORDER 02/21/2008  . ESOPHAGEAL DIVERTICULUM 02/21/2008  . SLEEP APNEA 02/21/2008  . ATHEROSCLEROSIS 07/11/2007  . INGUINAL HERNIAS, BILATERAL 07/11/2007  . HERNIA, UMBILICAL [id number]  . CHOLELITHIASIS 07/11/2007  . UNSPECIFIED RENAL SCLEROSIS 07/11/2007  . TUBULOVILLOUS ADENOMA, COLON 11/02/2005  . GERD 11/02/2005  . DIVERTICULOSIS OF COLON 11/02/2005  . MELANOSIS COLI 11/02/2005    Orientation RESPIRATION BLADDER Height & Weight     Self, Place  Normal (Room air) Incontinent Weight: 205 lb 11 oz (93.3 kg) Height:  6' (182.9 cm)  BEHAVIORAL SYMPTOMS/MOOD NEUROLOGICAL BOWEL NUTRITION STATUS        Diet (Low sodium, heart healthy)  AMBULATORY STATUS COMMUNICATION OF NEEDS Skin   Limited Assist Verbally Other (Comment) (See additional information)                       Personal Care Assistance Level of Assistance  Bathing, Dressing Bathing Assistance: Limited assistance   Dressing Assistance: Limited assistance     Functional Limitations Info  SPECIAL CARE FACTORS FREQUENCY  OT (By licensed OT), PT (By licensed PT)     PT Frequency: 5 X weekly OT Frequency: 5 X weekly            Contractures      Additional Factors Info  Code Status, Allergies Code Status Info: FULL Allergies Info: No known  allergies           Current Medications (01/18/2016):  This is the current hospital active medication list Current Facility-Administered Medications  Medication Dose Route Frequency Provider Last Rate Last Dose  . acetaminophen (TYLENOL) tablet 650 mg  650 mg Oral Q6H PRN Reubin Milan, MD   650 mg at 01/17/16 1723  . aspirin EC tablet 81 mg  81 mg Oral q morning - 10a Reubin Milan, MD   81 mg at 01/18/16 0944  . bisacodyl (DULCOLAX) suppository 10 mg  10 mg Rectal Daily PRN Reubin Milan, MD      . bisacodyl (DULCOLAX) suppository 10 mg  10 mg Rectal Once Venetia Maxon Rama, MD   10 mg at 01/18/16 0945  . carbidopa-levodopa (SINEMET IR) 10-100 MG per tablet immediate release 1 tablet  1 tablet Oral QPM Reubin Milan, MD   1 tablet at 01/17/16 1723  . ceFEPIme (MAXIPIME) 2 g in dextrose 5 % 50 mL IVPB  2 g Intravenous Q8H Anh P Pham, RPH   2 g at 01/18/16 1416  . diltiazem (DILACOR XR) 24 hr capsule 360 mg  360 mg Oral Daily Reubin Milan, MD   360 mg at 01/18/16 0944  . enoxaparin (LOVENOX) injection 40 mg  40 mg Subcutaneous Q24H Reubin Milan, MD   40 mg at 01/18/16 0944  . fentaNYL (DURAGESIC - dosed mcg/hr) 50 mcg  50 mcg Transdermal Q3 days Reubin Milan, MD   50 mcg at 01/16/16 0127  . gabapentin (NEURONTIN) capsule 300 mg  300 mg Oral TID Reubin Milan, MD   300 mg at 01/18/16 0944  . guaiFENesin (MUCINEX) 12 hr tablet 600 mg  600 mg Oral BID Reubin Milan, MD   600 mg at 01/18/16 0944  . ipratropium (ATROVENT) nebulizer solution 0.5 mg  0.5 mg Nebulization BID Gershon Mussel, RRT   0.5 mg at 01/18/16 0806  . levalbuterol (XOPENEX) nebulizer solution 0.63 mg  0.63 mg Nebulization BID Gershon Mussel, RRT   0.63 mg at 01/18/16 0806  . levothyroxine (SYNTHROID, LEVOTHROID) tablet 112 mcg  112 mcg Oral QAC breakfast Reubin Milan, MD   112 mcg at 01/18/16 0845  . linagliptin (TRADJENTA) tablet 5 mg  5 mg Oral Daily Reubin Milan, MD    5 mg at 01/18/16 0944  . LORazepam (ATIVAN) tablet 0.5 mg  0.5 mg Oral TID Reubin Milan, MD   0.5 mg at 01/18/16 0944  . magnesium hydroxide (MILK OF MAGNESIA) suspension 30 mL  30 mL Oral Daily PRN Reubin Milan, MD      . ondansetron [facility]) tablet 4 mg  4 mg Oral Q6H PRN Reubin Milan, MD       Or  . ondansetron [facility]) injection 4 mg  4 mg Intravenous Q6H PRN Reubin Milan, MD   4 mg at 01/17/16 1404  . oxyCODONE (Oxy IR/ROXICODONE) immediate release tablet 5 mg  5 mg Oral Q6H PRN Reubin Milan, MD   5 mg at 01/18/16 1232  . pantoprazole (PROTONIX) EC tablet 40 mg  40  mg Oral Daily Reubin Milan, MD   40 mg at 01/18/16 0944  . polyethylene glycol (MIRALAX / GLYCOLAX) packet 17 g  17 g Oral Daily Venetia Maxon Rama, MD   17 g at 01/18/16 0944  . QUEtiapine (SEROQUEL) tablet 25 mg  25 mg Oral QHS Reubin Milan, MD   25 mg at 01/17/16 2141  . sertraline (ZOLOFT) tablet 100 mg  100 mg Oral QPM Reubin Milan, MD   100 mg at 01/17/16 1723  . simvastatin (ZOCOR) tablet 10 mg  10 mg Oral Daily Reubin Milan, MD   10 mg at 01/18/16 0944  . sodium chloride flush (NS) 0.9 % injection 3 mL  3 mL Intravenous Q12H Reubin Milan, MD   3 mL at 01/16/16 0045  . vancomycin (VANCOCIN) IVPB 1000 mg/200 mL premix  1,000 mg Intravenous Q12H Anh P Pham, RPH   1,000 mg at 01/18/16 1220     Discharge Medications: Please see discharge summary for a list of discharge medications.  Relevant Imaging Results:  Relevant Lab Results:   Additional Information SSN: [id number]; Wound care to bilateral LEs: Twice weekly on Sundays and Wedesdays, Cleanse with NS, pat gently dry. Apply a silicone foam 5x5 inch foam dressing to the left medial LE partial thickness tissue injuries. Apply a Vaseline (white petrolatum gauze dressing) to the right heel pressure injury and top with a 5x5 inch foam dressing. Obtain two 4-inch [place] dressings Kellie Simmering [id number]) prior to  contacting Ortho tech for [employer] replacement.  Linna Darner, LCSW

## 2016-01-18 NOTE — Clinical Social Work Note (Signed)
Clinical Social Work Assessment  Patient Details  Name: [name] MRN: [id number] Date of Birth: October 28, 1929  Date of referral:  01/18/16               Reason for consult:  Facility Placement                Permission sought to share information with:  Facility Sport and exercise psychologist, Family Supports Permission granted to share information::  Yes, Verbal Permission Granted  Name::        Agency::     Relationship::     Contact Information:     Housing/Transportation Living arrangements for the past 2 months:  [place] of Information:  Spouse Patient Interpreter Needed:  None Criminal Activity/Legal Involvement Pertinent to Current Situation/Hospitalization:  No - Comment as needed Significant Relationships:  Spouse Lives with:  Facility Resident Do you feel safe going back to the place where you live?  Yes Need for family participation in patient care:  Yes (Comment)  Care giving concerns:  CSW spoke to pt's wife, [name] [phone] as pt is disorientated X 2. [name] would like pt to return to [facility] where he's been living since July 2017 as a LTC resident [CSW also confirmed this with the facility]. [name] is unable to meet pt's needs at home.    Social Worker assessment / plan:  CSW spoke with pt's wife, [name] and [facility] where pt is a LTC resident. Pt is able to return today if he's discharged- currently awaiting PT eval to assess needs. CSW will remain in contact with [facility] staff and provide updated clinical information as it becomes available in order to facilitate pt's return to facility. Of note, pt is utilizing Medicaid at [facility].   Employment status:  Retired Nurse, adult, Medicaid In [place] PT Recommendations:  Not assessed at this time [place] / Referral to community resources:  [place]  Patient/Family's Response to care:  Pt's wife is satisfied with the level of care pt is  receiving at [facility], although she'd like him to come home she acknowledges she's unable to take care of him.   Patient/Family's Understanding of and Emotional Response to Diagnosis, Current Treatment, and Prognosis:  Pt's wife is realistic regarding pt's current needs and agreeable with facility placement.   Emotional Assessment Appearance:  Appears stated age Attitude/Demeanor/Rapport:   (Disorientated) Affect (typically observed):  Appropriate Orientation:  Oriented to Place, Oriented to Self Alcohol / Substance use:  Not Applicable Psych involvement (Current and /or in the community):  No (Comment)  Discharge Needs  Concerns to be addressed:  No discharge needs identified Readmission within the last 30 days:  No Current discharge risk:  None Barriers to Discharge:  Continued Medical Work up   Linna Darner, LCSW 01/18/2016, 10:19 AM

## 2016-01-18 NOTE — Care Management Important Message (Signed)
Important Message  Patient Details  Name: [name] MRN: [id number] Date of Birth: 07-12-1929   Medicare Important Message Given:  Yes    Camillo Flaming 01/18/2016, 10:19 AMImportant Message  Patient Details  Name: [name] MRN: [id number] Date of Birth: [birth year]   Medicare Important Message Given:  Yes    Camillo Flaming 01/18/2016, 10:19 AM

## 2016-01-18 NOTE — Evaluation (Signed)
Physical Therapy Evaluation Patient Details Name: [name] MRN: [id number] DOB: 06/16/1929 Today's Date: 01/18/2016   History of Present Illness  82 y.o. male with h/o COPD, PVD, DM, bipolar, anxiety andmitted with AMS. Dx: HCAP, sepsis, BLE wounds.   Clinical Impression  Pt admitted with above diagnosis. Pt currently with functional limitations due to the deficits listed below (see PT Problem List). Min assist for bed to recliner pivot transfer. Activity tolerance limited by RLE pain. At baseline pt primarily uses WC and walks "a little". He can return to ALF from PT standpoint if they can assist with ADLs and mobility.  Pt will benefit from skilled PT to increase their independence and safety with mobility to allow discharge to the venue listed below.   *    Follow Up Recommendations Supervision/Assistance - 24 hour;SNF (return to ALF if they can provide assist for mobility and ADLs)    Equipment Recommendations  None recommended by PT    Recommendations for Other Services       Precautions / Restrictions Precautions Precautions: Fall Restrictions Weight Bearing Restrictions: No      Mobility  Bed Mobility Overal bed mobility: Modified Independent             General bed mobility comments: with HOB up 40*, used rail, no physical assist needed  Transfers Overall transfer level: Needs assistance Equipment used: Rolling walker (2 wheeled) Transfers: Sit to/from [facility] Sit to Stand: Min assist Stand pivot transfers: Min guard       General transfer comment: Min A to rise, min/guard for pivot to [facility] then to recliner with RW  Ambulation/Gait             General Gait Details: NT due to RLE pain in standing  Stairs            Wheelchair Mobility    Modified Rankin (Stroke Patients Only)       Balance Overall balance assessment: Needs assistance   Sitting balance-Leahy Scale: Good     Standing balance support:  Bilateral upper extremity supported Standing balance-Leahy Scale: Poor Standing balance comment: relient upon BUE support                             Pertinent Vitals/Pain Pain Assessment: 0-10 Pain Score: 10-Worst pain ever Pain Location: RLE Pain Descriptors / Indicators: Sore Pain Intervention(s): Limited activity within patient's tolerance;Monitored during session;Patient requesting pain meds-RN notified    Home Living Family/patient expects to be discharged to:: Assisted living               Home Equipment: Walker - 2 wheels;Cane - single point;Shower seat;Bedside commode;Wheelchair - manual      Prior Function Level of Independence: Needs assistance   Gait / Transfers Assistance Needed: pt stated he mostly uses WC which he can self propel, that he walks "a little",  stated he has assist to get up to [facility] but doesn't need it  ADL's / Homemaking Assistance Needed: assist at ALF        Hand Dominance   Dominant Hand: Right    Extremity/Trunk Assessment   Upper Extremity Assessment: Generalized weakness           Lower Extremity Assessment: Generalized weakness      Cervical / Trunk Assessment: Kyphotic  Communication   Communication: HOH  Cognition Arousal/Alertness: Awake/alert Behavior During Therapy: WFL for tasks assessed/performed Overall Cognitive Status: Within Functional Limits for  tasks assessed                      General Comments      Exercises        Assessment/Plan    PT Assessment Patient needs continued PT services  PT Diagnosis Difficulty walking;Generalized weakness;Acute pain   PT Problem List Decreased strength;Decreased activity tolerance;Decreased balance;Pain;Decreased mobility  PT Treatment Interventions Gait training;Functional mobility training;Therapeutic activities;Patient/family education;Balance training;Therapeutic exercise   PT Goals (Current goals can be found in the Care Plan section) Acute  Rehab PT Goals Patient Stated Goal: return to ALF PT Goal Formulation: With patient Time For Goal Achievement: 02/01/16 Potential to Achieve Goals: Fair    Frequency Min 3X/week   Barriers to discharge        Co-evaluation               End of Session Equipment Utilized During Treatment: Gait belt Activity Tolerance: Patient limited by pain Patient left: in chair;with call bell/phone within reach;with nursing/sitter in room;with chair alarm set           Time: [id number] PT Time Calculation (min) (ACUTE ONLY): 23 min   Charges:   PT Evaluation $PT Eval Low Complexity: 1 Procedure PT Treatments $Therapeutic Activity: 8-22 mins   PT G Codes:        Philomena Doheny 01/18/2016, 12:58 PM [phone]

## 2016-01-18 NOTE — Progress Notes (Signed)
Speech Language Pathology Treatment: Dysphagia  Patient Details Name: [name] MRN: [id number] DOB: [birth year] Today's Date: 01/18/2016 Time: [id number] SLP Time Calculation (min) (ACUTE ONLY): 23 min  Assessment / Plan / Recommendation Clinical Impression  F/u after 1/28 swallow evaluation.  Pt confused, friendly, talking at length about being "ready to go" and that he thinks death is imminent.  Oriented to self only.  Pt declined solid foods but asked for Coke.  Water and cola consumed from a straw revealed no s/s of aspiration.  Belching was frequent after consumption of water.  Pt has remote hx of esophageal issues - unable today to describe prior or current symptoms.  Will f/u briefly to ensure continued toleration of recommended diet.    HPI HPI: 82 y.o. male with below past medical history who is brought by EMS from [facility] upon leaving the rehabilitation facility with a history of altered mental status.  Dx sepsis secondary to HCAP, COPD with acute exacerbation. Remote hx esophageal deficits with dilatation 2008.       SLP Plan  Continue with current plan of care     Recommendations  Diet recommendations: Regular;Thin liquid Liquids provided via: Cup;Straw Medication Administration: Whole meds with liquid Supervision: Patient able to self feed Compensations: Slow rate;Small sips/bites Postural Changes and/or Swallow Maneuvers: Seated upright 90 degrees;Upright 30-60 min after meal             Oral Care Recommendations: Oral care BID Follow up Recommendations:  (tba) Plan: Continue with current plan of care     GO               [name] L. Tivis Ringer, [place] CCC/SLP Pager [phone]  [name] 01/18/2016, 3:25 PM

## 2016-01-19 ENCOUNTER — Non-Acute Institutional Stay (SKILLED_NURSING_FACILITY): Payer: Medicare Other | Admitting: Internal Medicine

## 2016-01-19 ENCOUNTER — Encounter: Payer: Self-pay | Admitting: Internal Medicine

## 2016-01-19 DIAGNOSIS — I70238 Atherosclerosis of native arteries of right leg with ulceration of other part of lower right leg: Secondary | ICD-10-CM | POA: Diagnosis not present

## 2016-01-19 DIAGNOSIS — L98499 Non-pressure chronic ulcer of skin of other sites with unspecified severity: Secondary | ICD-10-CM

## 2016-01-19 DIAGNOSIS — J15212 Pneumonia due to Methicillin resistant Staphylococcus aureus: Secondary | ICD-10-CM | POA: Diagnosis present

## 2016-01-19 DIAGNOSIS — E1122 Type 2 diabetes mellitus with diabetic chronic kidney disease: Secondary | ICD-10-CM

## 2016-01-19 DIAGNOSIS — E1142 Type 2 diabetes mellitus with diabetic polyneuropathy: Secondary | ICD-10-CM

## 2016-01-19 DIAGNOSIS — J441 Chronic obstructive pulmonary disease with (acute) exacerbation: Secondary | ICD-10-CM

## 2016-01-19 DIAGNOSIS — S81802D Unspecified open wound, left lower leg, subsequent encounter: Secondary | ICD-10-CM

## 2016-01-19 DIAGNOSIS — E038 Other specified hypothyroidism: Secondary | ICD-10-CM

## 2016-01-19 DIAGNOSIS — E1149 Type 2 diabetes mellitus with other diabetic neurological complication: Secondary | ICD-10-CM

## 2016-01-19 DIAGNOSIS — E034 Atrophy of thyroid (acquired): Secondary | ICD-10-CM

## 2016-01-19 DIAGNOSIS — E785 Hyperlipidemia, unspecified: Secondary | ICD-10-CM

## 2016-01-19 DIAGNOSIS — I350 Nonrheumatic aortic (valve) stenosis: Secondary | ICD-10-CM

## 2016-01-19 DIAGNOSIS — I70248 Atherosclerosis of native arteries of left leg with ulceration of other part of lower left leg: Secondary | ICD-10-CM

## 2016-01-19 DIAGNOSIS — I472 Ventricular tachycardia: Secondary | ICD-10-CM

## 2016-01-19 DIAGNOSIS — K219 Gastro-esophageal reflux disease without esophagitis: Secondary | ICD-10-CM

## 2016-01-19 DIAGNOSIS — I4729 Other ventricular tachycardia: Secondary | ICD-10-CM

## 2016-01-19 DIAGNOSIS — J189 Pneumonia, unspecified organism: Secondary | ICD-10-CM | POA: Diagnosis not present

## 2016-01-19 DIAGNOSIS — I359 Nonrheumatic aortic valve disorder, unspecified: Secondary | ICD-10-CM

## 2016-01-19 DIAGNOSIS — N183 Chronic kidney disease, stage 3 (moderate): Secondary | ICD-10-CM

## 2016-01-19 DIAGNOSIS — F3175 Bipolar disorder, in partial remission, most recent episode depressed: Secondary | ICD-10-CM

## 2016-01-19 DIAGNOSIS — A419 Sepsis, unspecified organism: Secondary | ICD-10-CM

## 2016-01-19 LAB — GLUCOSE, CAPILLARY
GLUCOSE-CAPILLARY: 89 mg/dL (ref 65–99)
Glucose-Capillary: 110 mg/dL — ABNORMAL HIGH (ref 65–99)

## 2016-01-19 LAB — BASIC METABOLIC PANEL
ANION GAP: 5 (ref 5–15)
BUN: 22 mg/dL — ABNORMAL HIGH (ref 6–20)
CALCIUM: 8.3 mg/dL — AB (ref 8.9–10.3)
CHLORIDE: 99 mmol/L — AB (ref 101–111)
CO2: 33 mmol/L — AB (ref 22–32)
CREATININE: 1.06 mg/dL (ref 0.61–1.24)
GFR calc non Af Amer: >60 mL/min (ref >60)
Glucose, Bld: 97 mg/dL (ref 65–99)
Potassium: 4.2 mmol/L (ref 3.5–5.1)
SODIUM: 137 mmol/L (ref 135–145)

## 2016-01-19 LAB — MAGNESIUM: Magnesium: 1.4 mg/dL — ABNORMAL LOW (ref 1.7–2.4)

## 2016-01-19 LAB — CULTURE, RESPIRATORY W GRAM STAIN

## 2016-01-19 LAB — CULTURE, RESPIRATORY

## 2016-01-19 MED ORDER — MAGNESIUM SULFATE 2 GM/50ML IV SOLN
2.0000 g | Freq: Once | INTRAVENOUS | Status: AC
  Administered 2016-01-19: 2 g via INTRAVENOUS
  Filled 2016-01-19: qty 50

## 2016-01-19 MED ORDER — LORAZEPAM 0.5 MG PO TABS: 0.5000 mg | ORAL_TABLET | Freq: Three times a day (TID) | ORAL | Status: DC

## 2016-01-19 MED ORDER — POLYETHYLENE GLYCOL 3350 17 G PO PACK: 17.0000 g | PACK | Freq: Every day | ORAL | Status: AC

## 2016-01-19 MED ORDER — DOXYCYCLINE HYCLATE 100 MG PO CAPS
100.0000 mg | ORAL_CAPSULE | Freq: Two times a day (BID) | ORAL | Status: AC
Start: 2016-01-19 — End: 2016-02-05

## 2016-01-19 MED ORDER — DOXYCYCLINE HYCLATE 100 MG PO CAPS: 100.0000 mg | ORAL_CAPSULE | Freq: Two times a day (BID) | ORAL | Status: DC

## 2016-01-19 MED ORDER — BISACODYL 10 MG RE SUPP: 10.0000 mg | Freq: Once | RECTAL | Status: AC

## 2016-01-19 MED ORDER — IPRATROPIUM BROMIDE 0.02 % IN SOLN: 0.5000 mg | Freq: Four times a day (QID) | RESPIRATORY_TRACT | Status: DC | PRN

## 2016-01-19 MED ORDER — OXYCODONE HCL 5 MG PO CAPS: 5.0000 mg | ORAL_CAPSULE | Freq: Four times a day (QID) | ORAL | Status: DC | PRN

## 2016-01-19 MED ORDER — LEVALBUTEROL HCL 0.63 MG/3ML IN NEBU: 0.6300 mg | INHALATION_SOLUTION | Freq: Four times a day (QID) | RESPIRATORY_TRACT | Status: DC | PRN

## 2016-01-19 MED ORDER — QUETIAPINE FUMARATE 25 MG PO TABS: 25.0000 mg | ORAL_TABLET | Freq: Every day | ORAL | Status: AC

## 2016-01-19 MED ORDER — POTASSIUM CHLORIDE CRYS ER 20 MEQ PO TBCR: 20.0000 meq | EXTENDED_RELEASE_TABLET | Freq: Two times a day (BID) | ORAL | Status: AC

## 2016-01-19 NOTE — Progress Notes (Signed)
Progress Note   [name] [id number] DOB: [birth year] DOA: 01/15/2016 PCP: Bruce Reel, MD   Brief Narrative:   [name] is an 82 y.o. male with a PMH of COPD, PAD, type 2 diabetes, aortic stenosis and bipolar disorder who was admitted with a 2 day history of confusion/altered mental status after completing a course of Levaquin for treatment of pneumonia. Upon initial evaluation in the ED, WBC was 18.7, magnesium was 1.0, and a chest x-ray showed a left lower lobe infiltrate.  Assessment/Plan:   Principal Problems:   Sepsis secondary to HCAP (healthcare-associated pneumonia) with sputum culture + staph aureus - From Staph Aureus HCAP.  - Continue vancomycin for + sputum culture staph aureus.  D/C Maxipime. - Strep pneumonia antigen negative. - Follow-up blood cultures. - Influenza panel negative.    COPD with acute exacerbation (HCC) - S/P 1 dose of Solu-Medrol.  Continue bronchodilators and supplemental oxygen. - Respiratory status stable.    Hypomagnesemia - Required additional supplementation last night.    Acute kidney injury - Creatinine improved with IV fluids.  Active Problems:   Nonsustained ventricular tachycardia - Occurred in the setting of hypomagnesemia. Magnesium replaced.    Normocytic anemia - Likely anemia of chronic disease. No current indication for transfusion.    Hypothyroidism - Continue Synthroid.    GERD - Continue Protonix.    Anxiety disorder - Continue Ativan.    Atherosclerosis of native arteries of the extremities with ulceration (HCC)/peripheral artery disease - Continue aspirin and statin.    DM2 (diabetes mellitus, type 2) (HCC) with stage III chronic kidney disease and peripheral neuropathy - Continue Trajenta. CBGs controlled, 86-113. - Continue neurontin for neuropathy.    Aortic stenosis: moderate-severe - Monitor for signs of fluid overload. Last Echo done 07/03/15.    Bipolar disorder ([place]) -  Continue Seroquel and Zoloft.    Altered mental status - Hold Flexeril.    Lower extremity venous insufficiency wounds in the setting of peripheral arterial disease and edema - Wound care nurse to evaluate. Continue Unna boots.    DVT Prophylaxis - Lovenox ordered.   Family Communication/Anticipated D/C date and plan/Code Status   Family Communication: [name] (wife) updated by telephone. Disposition Plan: SNF . Anticipated D/C date:   Can D/C to SNF on oral doxycycline when bed available. Code Status: Full code.   IV Access:    Peripheral IV   Procedures and diagnostic studies:   Dg Chest 2 View  01/15/2016  CLINICAL DATA:  Altered mental status. Recent treatment for pneumonia. Productive cough. History of COPD and hypertension. EXAM: CHEST  2 VIEW COMPARISON:  07/10/2015 FINDINGS: Cardiac enlargement with mild pulmonary vascular congestion. Interstitial changes in the lung bases likely representing mild edema. There is superimposed airspace disease in the left lung base with small left pleural effusion likely representing superimposed pneumonia. No pneumothorax. Calcified and tortuous aorta. Degenerative changes in the spine and shoulders. IMPRESSION: Cardiac enlargement with pulmonary vascular congestion and mild interstitial edema. Superimposed airspace consolidation in the left lung base with small left pleural effusion likely representing pneumonia. Electronically Signed   By: Lucienne Capers M.D.   On: 01/15/2016 21:14     Medical Consultants:    None.  Anti-Infectives:   Cefepime 01/15/16--->01/19/16 Vancomycin 01/15/16--->   Subjective:   [name] denies dyspnea.  No reports of leg pain today. No N/V.  Reports a poor appetite and ongoing fatigue/lack of energy.  Objective:    Filed Vitals:  01/18/16 1417 01/18/16 2054 01/18/16 2205 01/19/16 0526  BP: 130/49  131/57 123/41  Pulse: 90  99 88  Temp: 97.6 F (36.4 C)  98.6 F (37 C) 98.1 F (36.7  C)  TempSrc: Oral  Oral Oral  Resp: 16  17 17   Height:      Weight:      SpO2:  93% 98% 94%    Intake/Output Summary (Last 24 hours) at 01/19/16 0722 Last data filed at 01/19/16 0503  Gross per 24 hour  Intake   1630 ml  Output    300 ml  Net   1330 ml   [facility] Weights   01/15/16 2137 01/16/16 0049  Weight: 72.576 kg (160 lb) 93.3 kg (205 lb 11 oz)    Exam: Gen:  NAD Cardiovascular:  RRR, IV/VI SEM Respiratory:  Lungs CTAB Gastrointestinal:  Abdomen soft, NT/ND, + BS Extremities: 2+ bilateral foot swelling, unna boots on, wounds as documented by [facility] RN   Data Reviewed:    Labs: Basic Metabolic Panel:  Recent Labs Lab 01/15/16 2106 01/16/16 0153 01/17/16 0524 01/18/16 0448 01/19/16 0238  NA 138 137 141 141 137  K 4.1 3.9 4.4 4.5 4.2  CL 101 100* 103 102 99*  CO2 28 28 30 31  33*  GLUCOSE 133* 146* 106* 101* 97  BUN 37* 32* 26* 24* 22*  CREATININE 1.35* 1.29* 1.04 1.21 1.06  CALCIUM 8.0* 8.1* 8.7* 8.8* 8.3*  MG 1.0* 1.6*  --  1.7 1.4*  PHOS 3.5  --   --   --   --    GFR Estimated Creatinine Clearance: 60.5 mL/min (by C-G formula based on Cr of 1.06). Liver Function Tests:  Recent Labs Lab 01/15/16 2106 01/16/16 0153  AST 22 19  ALT 10* 10*  ALKPHOS 96 98  BILITOT 0.7 1.1  PROT 6.2* 6.2*  ALBUMIN 2.3* 2.4*    CBC:  Recent Labs Lab 01/15/16 2106 01/16/16 0153 01/17/16 0524 01/18/16 0448  WBC 18.7* 15.4* 21.1* 15.0*  NEUTROABS 15.8* 14.5*  --   --   HGB 9.9* 10.1* 9.8* 10.0*  HCT 31.0* 31.3* 30.8* 31.8*  MCV 80.7 80.5 81.5 83.7  PLT 230 231 255 231   CBG:  Recent Labs Lab 01/18/16 0730 01/18/16 1141 01/18/16 1624 01/18/16 1708 01/19/16 0701  GLUCAP 67 73 54* 84 89   Sepsis Labs:  Recent Labs Lab 01/15/16 2106 01/16/16 0153 01/17/16 0524 01/18/16 0448  WBC 18.7* 15.4* 21.1* 15.0*   Microbiology Recent Results (from the past 240 hour(s))  Culture, blood (routine x 2) Call MD if unable to obtain prior to antibiotics  being given     Status: None (Preliminary result)   Collection Time: 01/16/16  1:50 AM  Result Value Ref Range Status   Specimen Description BLOOD LEFT ANTECUBITAL  Final   Special Requests BOTTLES DRAWN AEROBIC AND ANAEROBIC 5CC  Final   Culture   Final    NO GROWTH 2 DAYS Performed at [facility]    Report Status PENDING  Incomplete  Culture, blood (routine x 2) Call MD if unable to obtain prior to antibiotics being given     Status: None (Preliminary result)   Collection Time: 01/16/16  1:53 AM  Result Value Ref Range Status   Specimen Description BLOOD BLOOD LEFT FOREARM  Final   Special Requests BOTTLES DRAWN AEROBIC ONLY [place]  Final   Culture   Final    NO GROWTH 2 DAYS Performed at [facility]  Report Status PENDING  Incomplete  Culture, sputum-assessment     Status: None   Collection Time: 01/16/16 10:11 PM  Result Value Ref Range Status   Specimen Description SPUTUM  Final   Special Requests NONE  Final   Sputum evaluation   Final    THIS SPECIMEN IS ACCEPTABLE. RESPIRATORY CULTURE REPORT TO FOLLOW.   Report Status 01/16/2016 FINAL  Final  Culture, respiratory (NON-Expectorated)     Status: None (Preliminary result)   Collection Time: 01/16/16 10:11 PM  Result Value Ref Range Status   Specimen Description SPUTUM  Final   Special Requests NONE  Final   Gram Stain   Final    ABUNDANT WBC PRESENT,BOTH PMN AND MONONUCLEAR FEW SQUAMOUS EPITHELIAL CELLS PRESENT FEW GRAM POSITIVE COCCI IN CLUSTERS FEW GRAM POSITIVE RODS FEW YEAST Performed at [facility]    Culture   Final    MODERATE STAPHYLOCOCCUS AUREUS Note: RIFAMPIN AND GENTAMICIN SHOULD NOT BE USED AS SINGLE DRUGS FOR TREATMENT OF STAPH INFECTIONS. Performed at [facility]    Report Status PENDING  Incomplete     Medications:   . aspirin EC  81 mg Oral q morning - 10a  . bisacodyl  10 mg Rectal Once  . carbidopa-levodopa  1 tablet Oral QPM  . ceFEPime (MAXIPIME) IV  2 g  Intravenous Q8H  . diltiazem  360 mg Oral Daily  . enoxaparin (LOVENOX) injection  40 mg Subcutaneous Q24H  . fentaNYL  50 mcg Transdermal Q3 days  . gabapentin  300 mg Oral TID  . guaiFENesin  600 mg Oral BID  . ipratropium  0.5 mg Nebulization BID  . levalbuterol  0.63 mg Nebulization BID  . levothyroxine  112 mcg Oral QAC breakfast  . linagliptin  5 mg Oral Daily  . LORazepam  0.5 mg Oral TID  . pantoprazole  40 mg Oral Daily  . polyethylene glycol  17 g Oral Daily  . QUEtiapine  25 mg Oral QHS  . sertraline  100 mg Oral QPM  . simvastatin  10 mg Oral Daily  . sodium chloride flush  3 mL Intravenous Q12H  . vancomycin  1,000 mg Intravenous Q12H   Continuous Infusions:    Time spent: 25 minutes.    LOS: 4 days   [facility] Pager [phone]. If unable to reach me by pager, please call my cell phone at [phone].  *Please refer to [email or web address], password [email or web address] to get updated schedule on who will round on this patient, as hospitalists switch teams weekly. If 7PM-7AM, please contact night-coverage at [email or web address], password [email or web address] for any overnight needs.  01/19/2016, 7:22 AM

## 2016-01-19 NOTE — Progress Notes (Signed)
Pt discharged from the unit via PTAR. AO x2. Discharge instructions and packet sent with PTAR. Report called to [facility] report given to [facility]. Informed facility of + MRSA sputum cultures. Wound care instructions reviewed. No questions or concerns at this time.  Elaf Clauson W Jakeira Seeman, RN

## 2016-01-19 NOTE — Discharge Summary (Addendum)
Physician Discharge Summary  [name] [id number] DOB: 02-Sep-1929 DOA: 01/15/2016  PCP: [name], MD  Admit date: 01/15/2016 Discharge date: 01/19/2016   Recommendations for Outpatient Follow-Up:   1. F/U with PCP in 1 week.  Treated for MRSA pneumonia.  Please F/U on final blood cultures. 2. Check magnesium level in 2-3 days.  May need routine supplementation. 3. Continue doxycycline x 3 weeks total, through 02/05/16.   Discharge Diagnosis:   Principal Problem:   HCAP (healthcare-associated pneumonia) MRSA pneumonia Active Problems:    Hypothyroidism    GERD    COPD with acute exacerbation ([name])    Anxiety disorder    Atherosclerosis of native arteries of the extremities with ulceration (HCC)    PAD (peripheral artery disease) (HCC)    DM2 (diabetes mellitus, type 2) (HCC)    Aortic stenosis    Bipolar disorder (HCC)    Hypomagnesemia    Altered mental status    Stage III chronic kidney disease    Acute kidney injury ([name])    Diabetic neuropathy ([name])    Chronic venous insufficiency    Nonsustained ventricular tachycardia ([name])   Discharge disposition:  SNF: [facility].  Discharge Condition: Improved.  Diet recommendation: Low sodium, heart healthy.  Carbohydrate-modified.    Wound care: Wound care to bilateral LEs: Twice weekly on Sundays and Wedesdays, Cleanse with NS, pat gently dry. Apply a silicone foam 5x5 inch foam dressing to the left medial LE partial thickness tissue injuries. Apply a Vaseline (white petrolatum gauze dressing) to the right heel pressure injury and top with a 5x5 inch foam dressing. Obtain two 4-inch Coban dressings Kellie Simmering [phone]) prior to contacting Ortho tech for [employer] replacement.  History of Present Illness:   [name] is an 82 y.o. male with a PMH of COPD, PAD, type 2 diabetes, aortic stenosis and bipolar disorder who was admitted with a 2 day history of confusion/altered mental status  after completing a course of Levaquin for treatment of pneumonia. Upon initial evaluation in the ED, WBC was 18.7, magnesium was 1.0, and a chest x-ray showed a left lower lobe infiltrate.  Hospital Course by Problem:   Principal Problems:  Sepsis secondary to HCAP (healthcare-associated pneumonia) with sputum culture + MRSA - From MRSA HCAP.  - D/C on doxycycline to complete a 3 week course of treatment. - Strep pneumonia antigen negative. - Follow-up blood cultures. - Influenza panel negative.   COPD with acute exacerbation (HCC) - S/P 1 dose of Solu-Medrol. Continue bronchodilators and supplemental oxygen. - Respiratory status stable.   Hypomagnesemia - Required additional supplementation last night.   Acute kidney injury - Creatinine improved with IV fluids.  Active Problems:  Nonsustained ventricular tachycardia - Occurred in the setting of hypomagnesemia. Magnesium replaced.   Normocytic anemia - Likely anemia of chronic disease. No current indication for transfusion.   Hypothyroidism - Continue Synthroid.   GERD - Continue Protonix.   Anxiety disorder - Continue Ativan.   Atherosclerosis of native arteries of the extremities with ulceration (HCC)/peripheral artery disease - Continue aspirin and statin.   DM2 (diabetes mellitus, type 2) (HCC) with stage III chronic kidney disease and peripheral neuropathy - Continue Trajenta. CBGs controlled, 86-113. - Continue neurontin for neuropathy.   Aortic stenosis: moderate-severe - Monitored for signs of fluid overload. Stable.  Last Echo done 07/03/15.   Bipolar disorder ([name]) - Continue Seroquel and Zoloft.   Altered mental status - Flexeril D/C'd.   Lower extremity venous insufficiency wounds  in the setting of peripheral arterial disease and edema - Wound care nurse to evaluate. Continue Unna boots.    Medical Consultants:    None.   Discharge Exam:   Filed Vitals:   01/18/16 2205  01/19/16 0526  BP: 131/57 123/41  Pulse: 99 88  Temp: 98.6 F (37 C) 98.1 F (36.7 C)  Resp: 17 17   Filed Vitals:   01/18/16 1417 01/18/16 2054 01/18/16 2205 01/19/16 0526  BP: 130/49  131/57 123/41  Pulse: 90  99 88  Temp: 97.6 F (36.4 C)  98.6 F (37 C) 98.1 F (36.7 C)  TempSrc: Oral  Oral Oral  Resp: 16  17 17   Height:      Weight:      SpO2:  93% 98% 94%   Gen: NAD Cardiovascular: RRR, IV/VI SEM Respiratory: Lungs CTAB Gastrointestinal: Abdomen soft, NT/ND, + BS Extremities: 2+ bilateral foot swelling, unna boots on, wounds as documented by WOC RN   The results of significant diagnostics from this hospitalization (including imaging, microbiology, ancillary and laboratory) are listed below for reference.     Procedures and Diagnostic Studies:   Dg Chest 2 View  01/15/2016  CLINICAL DATA:  Altered mental status. Recent treatment for pneumonia. Productive cough. History of COPD and hypertension. EXAM: CHEST  2 VIEW COMPARISON:  07/10/2015 FINDINGS: Cardiac enlargement with mild pulmonary vascular congestion. Interstitial changes in the lung bases likely representing mild edema. There is superimposed airspace disease in the left lung base with small left pleural effusion likely representing superimposed pneumonia. No pneumothorax. Calcified and tortuous aorta. Degenerative changes in the spine and shoulders. IMPRESSION: Cardiac enlargement with pulmonary vascular congestion and mild interstitial edema. Superimposed airspace consolidation in the left lung base with small left pleural effusion likely representing pneumonia. Electronically Signed   By: [name] M.D.   On: 01/15/2016 21:14     Labs:   Basic Metabolic Panel:  Recent Labs Lab 01/15/16 2106 01/16/16 0153 01/17/16 0524 01/18/16 0448 01/19/16 0238  NA 138 137 141 141 137  K 4.1 3.9 4.4 4.5 4.2  CL 101 100* 103 102 99*  CO2 28 28 30 31  33*  GLUCOSE 133* 146* 106* 101* 97  BUN 37* 32* 26*  24* 22*  CREATININE 1.35* 1.29* 1.04 1.21 1.06  CALCIUM 8.0* 8.1* 8.7* 8.8* 8.3*  MG 1.0* 1.6*  --  1.7 1.4*  PHOS 3.5  --   --   --   --    GFR Estimated Creatinine Clearance: 60.5 mL/min (by C-G formula based on Cr of 1.06). Liver Function Tests:  Recent Labs Lab 01/15/16 2106 01/16/16 0153  AST 22 19  ALT 10* 10*  ALKPHOS 96 98  BILITOT 0.7 1.1  PROT 6.2* 6.2*  ALBUMIN 2.3* 2.4*   CBC:  Recent Labs Lab 01/15/16 2106 01/16/16 0153 01/17/16 0524 01/18/16 0448  WBC 18.7* 15.4* 21.1* 15.0*  NEUTROABS 15.8* 14.5*  --   --   HGB 9.9* 10.1* 9.8* 10.0*  HCT 31.0* 31.3* 30.8* 31.8*  MCV 80.7 80.5 81.5 83.7  PLT 230 231 255 231   CBG:  Recent Labs Lab 01/18/16 0730 01/18/16 1141 01/18/16 1624 01/18/16 1708 01/19/16 0701  GLUCAP 67 73 54* 84 48   Microbiology Recent Results (from the past 240 hour(s))  Culture, blood (routine x 2) Call MD if unable to obtain prior to antibiotics being given     Status: None (Preliminary result)   Collection Time: 01/16/16  1:50 AM  Result  Value Ref Range Status   Specimen Description BLOOD LEFT ANTECUBITAL  Final   Special Requests BOTTLES DRAWN AEROBIC AND ANAEROBIC 5CC  Final   Culture   Final    NO GROWTH 2 DAYS Performed at [facility]    Report Status PENDING  Incomplete  Culture, blood (routine x 2) Call MD if unable to obtain prior to antibiotics being given     Status: None (Preliminary result)   Collection Time: 01/16/16  1:53 AM  Result Value Ref Range Status   Specimen Description BLOOD BLOOD LEFT FOREARM  Final   Special Requests BOTTLES DRAWN AEROBIC ONLY [place]  Final   Culture   Final    NO GROWTH 2 DAYS Performed at [facility]    Report Status PENDING  Incomplete  Culture, sputum-assessment     Status: None   Collection Time: 01/16/16 10:11 PM  Result Value Ref Range Status   Specimen Description SPUTUM  Final   Special Requests NONE  Final   Sputum evaluation   Final    THIS SPECIMEN IS  ACCEPTABLE. RESPIRATORY CULTURE REPORT TO FOLLOW.   Report Status 01/16/2016 FINAL  Final  Culture, respiratory (NON-Expectorated)     Status: None   Collection Time: 01/16/16 10:11 PM  Result Value Ref Range Status   Specimen Description SPUTUM  Final   Special Requests NONE  Final   Gram Stain   Final    ABUNDANT WBC PRESENT,BOTH PMN AND MONONUCLEAR FEW SQUAMOUS EPITHELIAL CELLS PRESENT FEW GRAM POSITIVE COCCI IN CLUSTERS FEW GRAM POSITIVE RODS FEW YEAST Performed at [facility]    Culture   Final    MODERATE METHICILLIN RESISTANT STAPHYLOCOCCUS AUREUS Note: RIFAMPIN AND GENTAMICIN SHOULD NOT BE USED AS SINGLE DRUGS FOR TREATMENT OF STAPH INFECTIONS. CRITICAL RESULT CALLED TO, READ BACK BY AND VERIFIED WITH: BOBBIE A 01/19/16 @0840  BY REAMM Performed at [facility]    Report Status 01/19/2016 FINAL  Final   Organism ID, Bacteria METHICILLIN RESISTANT STAPHYLOCOCCUS AUREUS  Final      Susceptibility   Methicillin resistant staphylococcus aureus - MIC*    CLINDAMYCIN >=8 RESISTANT Resistant     ERYTHROMYCIN >=8 RESISTANT Resistant     GENTAMICIN <=0.5 SENSITIVE Sensitive     LEVOFLOXACIN >=8 RESISTANT Resistant     OXACILLIN >=4 RESISTANT Resistant     RIFAMPIN <=0.5 SENSITIVE Sensitive     TRIMETH/SULFA <=10 SENSITIVE Sensitive     VANCOMYCIN 1 SENSITIVE Sensitive     TETRACYCLINE <=1 SENSITIVE Sensitive     * MODERATE METHICILLIN RESISTANT STAPHYLOCOCCUS AUREUS     Discharge Instructions:   Discharge Instructions    Call MD for:  difficulty breathing, headache or visual disturbances    Complete by:  As directed      Call MD for:  extreme fatigue    Complete by:  As directed      Call MD for:  temperature >100.4    Complete by:  As directed      Diet - low sodium heart healthy    Complete by:  As directed      Discharge wound care:    Complete by:  As directed   Wound care to bilateral LEs: Twice weekly on Sundays and Wedesdays, Cleanse with NS, pat  gently dry. Apply a silicone foam 5x5 inch foam dressing to the left medial LE partial thickness tissue injuries. Apply a Vaseline (white petrolatum gauze dressing) to the right heel pressure injury and top with  a 5x5 inch foam dressing. Obtain two 4-inch Coban dressings Kellie Simmering [phone]) prior to contacting Ortho tech for [employer] replacement.     Increase activity slowly    Complete by:  As directed      Walk with assistance    Complete by:  As directed      Walker     Complete by:  As directed             Medication List    STOP taking these medications        cyclobenzaprine 10 MG tablet  Commonly known as:  FLEXERIL     levofloxacin 750 MG tablet  Commonly known as:  LEVAQUIN      TAKE these medications        acetaminophen 325 MG tablet  Commonly known as:  TYLENOL  Take 650 mg by mouth every 6 (six) hours as needed for mild pain, moderate pain, fever or headache.     aspirin EC 81 MG tablet  Take 81 mg by mouth every morning.     bisacodyl 10 MG suppository  Commonly known as:  DULCOLAX  Place 10 mg rectally daily as needed for moderate constipation.     bisacodyl 10 MG suppository  Commonly known as:  DULCOLAX  Place 1 suppository (10 mg total) rectally once.     carbidopa-levodopa 10-100 MG tablet  Commonly known as:  SINEMET IR  Take 1 tablet by mouth every evening.     diltiazem 120 MG 24 hr capsule  Commonly known as:  DILACOR XR  Take 120 mg by mouth daily. Takes with 240 mg capsule to equal 360 mg     diltiazem 240 MG 24 hr capsule  Commonly known as:  CARDIZEM CD  Take 240 mg by mouth daily. Take with 120 mg capsule to equal 360 mg     doxycycline 100 MG capsule  Commonly known as:  VIBRAMYCIN  Take 1 capsule (100 mg total) by mouth 2 (two) times daily.     feeding supplement (ENSURE) Pudg  Take 1 Container by mouth 3 (three) times daily between meals.     fentaNYL 50 MCG/HR  Commonly known as:  DURAGESIC - dosed mcg/hr  Place 1 patch (50  mcg total) onto the skin every 3 (three) days.     Fluticasone-Salmeterol 250-50 MCG/DOSE Aepb  Commonly known as:  ADVAIR  Inhale 1-2 puffs into the lungs 2 (two) times daily as needed (for wheezing.).     furosemide 40 MG tablet  Commonly known as:  LASIX  Take 1 tablet (40 mg total) by mouth 2 (two) times daily.     gabapentin 300 MG capsule  Commonly known as:  NEURONTIN  Take 1 capsule (300 mg total) by mouth 2 (two) to 3 (three)  times daily.     ipratropium 0.02 % nebulizer solution  Commonly known as:  ATROVENT  Take 500 mcg by nebulization every 6 (six) hours as needed. For shortness of breath     ipratropium-albuterol 0.5-2.5 (3) MG/3ML Soln  Commonly known as:  DUONEB  Take 3 mLs by nebulization every 6 (six) hours as needed (shortness of breathe.).     levothyroxine 112 MCG tablet  Commonly known as:  SYNTHROID, LEVOTHROID  Take 112 mcg by mouth every morning.     linagliptin 5 MG Tabs tablet  Commonly known as:  TRADJENTA  Take 1 tablet (5 mg total) by mouth daily.     LORazepam 0.5 MG tablet  Commonly known as:  ATIVAN  Take 1 tablet (0.5 mg total) by mouth 3 (three) times daily.     magnesium hydroxide 400 MG/5ML suspension  Commonly known as:  MILK OF MAGNESIA  Take 30 mLs by mouth daily as needed for mild constipation.     MELATIN PO  Take 1 tablet by mouth at bedtime.     multivitamin with minerals Tabs tablet  Take 1 tablet by mouth daily.     omeprazole 20 MG capsule  Commonly known as:  PRILOSEC  Take 20 mg by mouth daily.     oxycodone 5 MG capsule  Commonly known as:  OXY-IR  Take 1 capsule (5 mg total) by mouth every 6 (six) hours as needed for pain.     polyethylene glycol packet  Commonly known as:  MIRALAX / GLYCOLAX  Take 17 g by mouth daily.     potassium chloride SA 20 MEQ tablet  Commonly known as:  K-DUR,KLOR-CON  Take 1 tablet (20 mEq total) by mouth 2 (two) times daily.     QUEtiapine 25 MG tablet  Commonly known as:   SEROQUEL  Take 1 tablet (25 mg total) by mouth at bedtime.     sertraline 100 MG tablet  Commonly known as:  ZOLOFT  Take 100 mg by mouth every evening.     simvastatin 10 MG tablet  Commonly known as:  ZOCOR  Take 10 mg by mouth daily.           Follow-up Information    Follow up with [name], MD. Schedule an appointment as soon as possible for a visit in 1 week.   Specialty:  Internal Medicine   Why:  Hospital follow up of MRSA HCAP   Contact information:   [address] [place] [address] [phone]        Time coordinating discharge: 35 minutes.  Signed:  Kaydyn Chism  Pager [phone] [facility] 01/19/2016, 9:00 AM

## 2016-01-19 NOTE — Progress Notes (Signed)
Patient ID: ARMOUR SILVER, male   DOB: May 02, 1929, 82 y.o.   MRN: [id number]    t  MRN: [id number] Name: [name]  Sex: male Age: 82 y.o. DOB: [birth year]  [place]: [name] Facility/Room: Level Of Care: SNF Provider: Wille Celeste Emergency Contacts: Extended Emergency Contact Information Primary Emergency Contact: [name],[name] Address: [address]          [place] of [place] Phone: [phone] Mobile Phone: [phone] Relation: Spouse Secondary Emergency Contact: [name],[name]  [place] Mobile Phone: [phone] Relation: Son  Code Status:   Allergies: Review of patient's allergies indicates no known allergies.  Chief Complaint  Patient presents with  . Acute Visit   secondary to leukocytosis-altered mental status  HPI: Patient is 82 y.o. male wth PVD with extremity ulcers, COPD, GERD, depression, HTN , CHF, CKD, OA diabetes type 2 who per nursing had some altered mental status and shaking episodes-lab work was ordered which showed an elevated white count of 20,900-.  Patient was recently treated for pneumonia ---chest x-ray  showed left basilar infiltrates with small associated per pneumatic effusion-also mild right lung base infiltrates-it showed mild cardiac enlargement without evidence of CHF or volume overload On-call provider did start him on Levaquin 750 mg every other day for 10 days as well asa probiotic-- Patient appeared to be stable and in his usual state of health-.  However the last day or so apparently he's had somewhat more confusion occasionally appeared to be shaking according to his nurse-lab work was ordered which is come back showing an elevated white count as noted above.  Patient generally has no acute complaints but says he just does not feel well.  Vital signs are stable he is afebrile.  He does have a history of COPD with previous hospitalization for pneumonia.  He also has  chronic leg wounds which are followed by wound care and the wound care nurse practitioner-apparently there is some weeping here his legs are wrapped today     I do note he had a vascular consult on 10/13/2015 with a history of moderate to severe arterial insufficiency which complicates his lower leg wounds-he was thought not to be a candidate for bypass and does not want one apparently.          Past Medical History  Diagnosis Date  . Hyperlipidemia   . COPD (chronic obstructive pulmonary disease) ([place])   . Leg pain   . GERD (gastroesophageal reflux disease)   . Depression   . Dizziness   . Productive cough   . Thyroid disease   . Peripheral vascular disease ([place])   . ED (erectile dysfunction)   . Hypertension     08/17/12 - wife denies  . Walking pneumonia 2000's  . H/O hiatal hernia   . Migraine     "when I was a young boy"  . Arthritis     "hands" (08/28/2014)  . Anxiety   . Hypothyroidism   . Chronic kidney disease (CKD), stage III (moderate)     [name] 08/28/2014    Past Surgical History  Procedure Laterality Date  . Renal artery stent Left   . Kidney stone surgery  1989  . Lower extremity angiogram Left 03-20-2014    Dr. Tinnie Gens  . Tonsillectomy    . Cataract extraction, bilateral Bilateral   . Blepharoplasty Right   . Abdominal aortagram N/A 03/20/2013    Procedure: ABDOMINAL Maxcine Ham;  Surgeon: [name],  MD;  Location: [facility] CATH LAB;  Service: Cardiovascular;  Laterality: N/A;  . Lower extremity angiogram Left 03/20/2013    Procedure: LOWER EXTREMITY ANGIOGRAM;  Surgeon: [name], MD;  Location: [facility] CATH LAB;  Service: Cardiovascular;  Laterality: Left;      Medication List    Notice    This visit is during an admission. Changes to the med list made in this visit will be reflected in the After Visit Summary of the admission.     of note he is now on Lasix 80 mg in the morning and 40 mg later in the day and potassium 20 mEq 2 times a  day    Immunization History  Administered Date(s) Administered  . Influenza,inj,Quad PF,36+ Mos 08/30/2014    Social History  Substance Use Topics  . Smoking status: Former Smoker -- 1.00 packs/day for 25 years    Types: Cigarettes    Quit date: 08/17/2012  . Smokeless tobacco: Never Used  . Alcohol Use: 0.0 oz/week    0 Standard drinks or equivalent per week     Comment: 08/28/2014 "last drink was in ~ 2012"    Review of Systems  DATA OBTAINED: from patient, nurse GENERAL:  no fevershas, fatigue, appetite changes--says he just does not feel well SKIN: No itching, rash--lower extremity wounds have been followed by wound care HEENT: No complaint RESPIRATORY: Does not complain of shortness of breath beyond baseline-apparently some history of cough-recently treated for pneumonia as noted above CARDIAC: No chest pain, palpitations, lower extremity edemaat baseline  GI: No abdominal pain, No N/V/D or constipation, No heartburn or reflux  GU: No dysuria, frequency or urgency, or incontinence  MUSCULOSKELETAL: No unrelieved bone/joint pain NEUROLOGIC: No headache, dizziness or recent syncopal or presyncopal episodes PSYCHIATRIC: No overt anxiety or sadness--no recent behaviors that I am aware of  Filed Vitals:   01/19/16 1035  BP: 125/81  Pulse: 72  Temp: 97.9 F (36.6 C)  Resp: 20    Physical Exam  GENERAL APPEARANCE: Alert, conversant, No acute distress sitting comfortably in his wheelchair--appears  does not feel well appear somewhat more weak  SKIN: No diaphoresis rash; legs wrapped HEENT: Unremarkable oropharynx is clear mucous membranes moist RESPIRATORY breathing is even, unlabored. Has some diffusebronchial sounds CARDIOVASCULAR: Heart RRR with occasional irregular beats no murmurs, rubs or gallops.-Legs are wrapped bilaterally GASTROINTESTINAL: Abdomen is soft, non-tender, not distended w/ normal bowel sounds.  GENITOURINARY: Bladder non tender, not distended no  overt suprapubic tenderness to palpation MUSCULOSKELETAL: No abnormal joints or musculature-  NEUROLOGIC: Cranial nerves 2-12 grossly intact. Moves all extremities PSYCHIATRIC: Mood and affect appropriate to situation, no behavioral issues--has some mild cognitive deficits he is pleasant and appropriate-- appears weaker than when I saw him last time  Patient Active Problem List   Diagnosis Date Noted  . Nonsustained ventricular tachycardia ([place]) 01/19/2016  . MRSA pneumonia ([place]) 01/19/2016  . Diabetic neuropathy ([place]) 01/18/2016  . Chronic venous insufficiency 01/18/2016  . Acute kidney injury ([place]) 01/17/2016  . Hypomagnesemia 01/16/2016  . Altered mental status 01/16/2016  . Stage III chronic kidney disease 01/16/2016  . HCAP (healthcare-associated pneumonia) 01/04/2016  . Hypotension 11/23/2015  . Candida rash of groin 09/23/2015  . Bipolar disorder ([place]) 07/14/2015  . Aortic stenosis 07/09/2015  . Chronic diastolic CHF (congestive heart failure) ([place]) 07/09/2015  . Sepsis due to pneumonia ([place]) 07/08/2015  . Acute respiratory failure with hypoxia ([place]) 07/02/2015  . Pulmonary edema 07/02/2015  . Leukocytosis 07/02/2015  . DM2 (  diabetes mellitus, type 2) ([place]) 07/02/2015  . Chronic osteomyelitis of right foot ([place]) 11/11/2014  . Beta-hemolytic group B streptococcal sepsis ([place]) 11/11/2014  . Bacteremia 11/11/2014  . Atherosclerotic PVD with ulceration ([place]) 10/28/2014  . Hyperlipidemia 10/07/2014  . Obesity 10/07/2014  . Moderate aortic stenosis 10/07/2014  . Multifocal atrial tachycardia ([place]) 10/06/2014  . Sepsis ([place]) 10/05/2014  . Wound of right leg 08/28/2014  . Foot ulcer ([place]) 01/04/2013  . Cellulitis 01/04/2013  . PAD (peripheral artery disease) ([place]) 01/04/2013  . Bilateral lower extremity edema 10/23/2012  . Atherosclerosis of native arteries of the extremities with ulceration ([place]) 10/23/2012  . Aspiration pneumonia ([place]) 08/17/2012  . Toxic encephalopathy  08/17/2012  . COPD with acute exacerbation ([place]) 08/17/2012  . Sacral decubitus ulcer 08/17/2012  . Chronic pain 08/17/2012  . Anxiety disorder 08/17/2012  . Chronic kidney disease, stage III (moderate) 08/17/2012  . Hypothyroidism 02/21/2008  . DYSLIPIDEMIA 02/21/2008  . ANXIETY DEPRESSION 02/21/2008  . RHEUMATIC FEVER 02/21/2008  . Hypertensive heart disease with CHF ([place]) 02/21/2008  . COPD 02/21/2008  . ESOPHAGEAL MOTILITY DISORDER 02/21/2008  . ESOPHAGEAL DIVERTICULUM 02/21/2008  . SLEEP APNEA 02/21/2008  . ATHEROSCLEROSIS 07/11/2007  . INGUINAL HERNIAS, BILATERAL 07/11/2007  . HERNIA, UMBILICAL [id number]  . CHOLELITHIASIS 07/11/2007  . UNSPECIFIED RENAL SCLEROSIS 07/11/2007  . TUBULOVILLOUS ADENOMA, COLON 11/02/2005  . GERD 11/02/2005  . DIVERTICULOSIS OF COLON 11/02/2005  . MELANOSIS COLI 11/02/2005    Labs  01/15/2016.  WBC 20.9-hemoglobin 10.3 platelets 283.  01/05/2016.  Sodium 136 potassium 3.7 BUN 37 creatinine 1.4  12/30/2015.  Sodium 138 potassium 3.7 BUN 19 creatinine 1.4.  12/18/2015.  HDL 36--LDL 38--triglycerides 89--cholesterol 92 11/24/2015.  Liver function tests within normal limits.  Sodium 138 potassium 3.5 BUN 29 creatinine 1.7.  WBC 10.6 hemoglobin 12.3 platelets 182.  10/07/2015.  Creatinine 1.6.  Over 16 2016.  Sodium 138  BUN 12 creatinine 1.8.  WBC 8.8 hemoglobin 10.3 platelets 214.    CBC    Component Value Date/Time   WBC 15.0* 01/18/2016 0448   RBC 3.80* 01/18/2016 0448   HGB 10.0* 01/18/2016 0448   HCT 31.8* 01/18/2016 0448   PLT 231 01/18/2016 0448   MCV 83.7 01/18/2016 0448   LYMPHSABS 0.5* 01/16/2016 0153   MONOABS 0.3 01/16/2016 0153   EOSABS 0.1 01/16/2016 0153   BASOSABS 0.0 01/16/2016 0153    CMP     Component Value Date/Time   NA 137 01/19/2016 0238   K 4.2 01/19/2016 0238   CL 99* 01/19/2016 0238   CO2 33* 01/19/2016 0238   GLUCOSE 97 01/19/2016 0238   BUN 22* 01/19/2016 0238   CREATININE  1.06 01/19/2016 0238   CALCIUM 8.3* 01/19/2016 0238   PROT 6.2* 01/16/2016 0153   ALBUMIN 2.4* 01/16/2016 0153   AST 19 01/16/2016 0153   ALT 10* 01/16/2016 0153   ALKPHOS 98 01/16/2016 0153   BILITOT 1.1 01/16/2016 0153   GFRNONAA >60 01/19/2016 0238   GFRAA >60 01/19/2016 0238    Assessment and Plan  History of leukocytosis and altered mental status increased weakness-with a white count of over 20,000 one would be concerned about a possible septic process especially with patient's presentation today appears to be weaker although vital signs are stable-one would be concerned possibly about recurrent pneumonia or possibly etiology of worsening leg wounds-we will send to the ER for expedient evaluation-  [id number]

## 2016-01-19 NOTE — Progress Notes (Signed)
MRN: [id number] Name: [name]  Sex: male Age: 82 y.o. DOB: [birth year]  [place] #: [name] Facility/Room:217 Level Of Care: SNF Provider: Inocencio Homes D Emergency Contacts: Extended Emergency Contact Information Primary Emergency Contact: [name],[name] Address: [address]          [place] of [place] Phone: [phone] Mobile Phone: [phone] Relation: Spouse Secondary Emergency Contact: [name],[name]  [place] Mobile Phone: [phone] Relation: Son  Code Status:   Allergies: Review of patient's allergies indicates no known allergies.  Chief Complaint  Patient presents with  . Readmit To SNF    HPI: Patient is 82 y.o. male with PMH of COPD, PAD, type 2 diabetes, aortic stenosis and bipolar disorder who was admitted with a 2 day history of confusion/altered mental status after completing a course of Levaquin for treatment of pneumonia. Upon initial evaluation in the ED, WBC was 18.7, magnesium was 1.0, and a chest x-ray showed a left lower lobe infiltrate. Pt was admitted to [facility] from 1/27-1/31 for MRSA PNA and sepsis 2/2 to that. Hospital course was complicated by COPD exacerbation, NSVT from hypomagnesemia and acute on chronic kidney diease. Pt is admitted back to SNF for residential care and 3 more weeks of antibiotics.While at SNF pt will be followed for PVD, tx with ASA and statin, hypothyroidism, tx with syntrhoid, and GERD, tx with protonix.  Past Medical History  Diagnosis Date  . Hyperlipidemia   . COPD (chronic obstructive pulmonary disease) ([place])   . Leg pain   . GERD (gastroesophageal reflux disease)   . Depression   . Dizziness   . Productive cough   . Thyroid disease   . Peripheral vascular disease ([place])   . ED (erectile dysfunction)   . Hypertension     08/17/12 - wife denies  . Walking pneumonia 2000's  . H/O hiatal hernia   . Migraine     "when I was a young boy"  . Arthritis     "hands"  (08/28/2014)  . Anxiety   . Hypothyroidism   . Chronic kidney disease (CKD), stage III (moderate)     [name] 08/28/2014    Past Surgical History  Procedure Laterality Date  . Renal artery stent Left   . Kidney stone surgery  1989  . Lower extremity angiogram Left 03-20-2014    Dr. Tinnie Gens  . Tonsillectomy    . Cataract extraction, bilateral Bilateral   . Blepharoplasty Right   . Abdominal aortagram N/A 03/20/2013    Procedure: ABDOMINAL Maxcine Ham;  Surgeon: [name], MD;  Location: [facility] CATH LAB;  Service: Cardiovascular;  Laterality: N/A;  . Lower extremity angiogram Left 03/20/2013    Procedure: LOWER EXTREMITY ANGIOGRAM;  Surgeon: [name], MD;  Location: [facility] CATH LAB;  Service: Cardiovascular;  Laterality: Left;      Medication List    Notice    This visit is on the same day as an admission, and a visit start time could not be determined. If the visit took place after discharge, manually review the med list with the patient.      No orders of the defined types were placed in this encounter.    Immunization History  Administered Date(s) Administered  . Influenza,inj,Quad PF,36+ Mos 08/30/2014    Social History  Substance Use Topics  . Smoking status: Former Smoker -- 1.00 packs/day for 25 years    Types: Cigarettes    Quit date: 08/17/2012  . Smokeless tobacco: Never  Used  . Alcohol Use: 0.0 oz/week    0 Standard drinks or equivalent per week     Comment: 08/28/2014 "last drink was in ~ 2012"    Family history is  + DM, ETOH abuse  Review of Systems  DATA OBTAINED: from patient, nurse GENERAL:  no fevers, fatigue, appetite changes SKIN: No itching, rash or wounds EYES: No eye pain, redness, discharge EARS: No earache, tinnitus, change in hearing NOSE: No congestion, drainage or bleeding  MOUTH/THROAT: No mouth or tooth pain, No sore throat RESPIRATORY: No cough, wheezing, SOB CARDIAC: No chest pain, palpitations, lower extremity edema  GI: No  abdominal pain, No N/V/D or constipation, No heartburn or reflux  GU: No dysuria, frequency or urgency, or incontinence  MUSCULOSKELETAL: No unrelieved bone/joint pain NEUROLOGIC: No headache, dizziness or focal weakness PSYCHIATRIC: No c/o anxiety or sadness   Filed Vitals:   01/23/16 1118  BP: 130/78  Pulse: 92  Temp: 98.2 F (36.8 C)  Resp: 20    SpO2 Readings from Last 1 Encounters:  01/19/16 90%        Physical Exam  GENERAL APPEARANCE: Alert, conversant,  No acute distress; sitting up in WC in room  SKIN: BLE in unaboots HEAD: Normocephalic, atraumatic  EYES: Conjunctiva/lids clear. Pupils round, reactive. EOMs intact.  EARS: External exam WNL, canals clear. Hearing grossly normal.  NOSE: No deformity or discharge.  MOUTH/THROAT: Lips w/o lesions  RESPIRATORY: Breathing is even, unlabored. Lung sounds are diffusely decreased  CARDIOVASCULAR: Heart RRR no murmurs, rubs or gallops; LE in unaboots  GASTROINTESTINAL: Abdomen is soft, non-tender, not distended w/ normal bowel sounds. GENITOURINARY: Bladder non tender, not distended  MUSCULOSKELETAL: No abnormal joints or musculature NEUROLOGIC:  Cranial nerves 2-12 grossly intact. Moves all extremities  PSYCHIATRIC: Mood and affect appropriate to situation, no behavioral issues  Patient Active Problem List   Diagnosis Date Noted  . Wounds, multiple open, lower extremity 01/23/2016  . Nonsustained ventricular tachycardia ([place]) 01/19/2016  . MRSA pneumonia ([place]) 01/19/2016  . Diabetic neuropathy ([place]) 01/18/2016  . Chronic venous insufficiency 01/18/2016  . Acute kidney injury ([place]) 01/17/2016  . Hypomagnesemia 01/16/2016  . Altered mental status 01/16/2016  . Stage III chronic kidney disease 01/16/2016  . HCAP (healthcare-associated pneumonia) 01/04/2016  . Hypotension 11/23/2015  . Candida rash of groin 09/23/2015  . Bipolar disorder ([place]) 07/14/2015  . Aortic stenosis 07/09/2015  . Chronic diastolic CHF  (congestive heart failure) ([place]) 07/09/2015  . Sepsis due to pneumonia ([place]) 07/08/2015  . Acute respiratory failure with hypoxia ([place]) 07/02/2015  . Pulmonary edema 07/02/2015  . Leukocytosis 07/02/2015  . Type 2 diabetes mellitus with stage 3 chronic kidney disease ([place]) 07/02/2015  . Chronic osteomyelitis of right foot ([place]) 11/11/2014  . Beta-hemolytic group B streptococcal sepsis ([place]) 11/11/2014  . Bacteremia 11/11/2014  . Atherosclerotic PVD with ulceration ([place]) 10/28/2014  . Hyperlipidemia 10/07/2014  . Obesity 10/07/2014  . Moderate aortic stenosis 10/07/2014  . Multifocal atrial tachycardia ([place]) 10/06/2014  . Sepsis ([place]) 10/05/2014  . Wound of right leg 08/28/2014  . Foot ulcer ([place]) 01/04/2013  . Cellulitis 01/04/2013  . PAD (peripheral artery disease) ([place]) 01/04/2013  . Bilateral lower extremity edema 10/23/2012  . Atherosclerosis of native arteries of the extremities with ulceration ([place]) 10/23/2012  . Aspiration pneumonia ([place]) 08/17/2012  . Toxic encephalopathy 08/17/2012  . COPD with acute exacerbation ([place]) 08/17/2012  . Sacral decubitus ulcer 08/17/2012  . Chronic pain 08/17/2012  . Anxiety disorder 08/17/2012  . Chronic kidney  disease, stage III (moderate) 08/17/2012  . Hypothyroidism 02/21/2008  . DYSLIPIDEMIA 02/21/2008  . ANXIETY DEPRESSION 02/21/2008  . RHEUMATIC FEVER 02/21/2008  . Hypertensive heart disease with CHF ([place]) 02/21/2008  . COPD 02/21/2008  . ESOPHAGEAL MOTILITY DISORDER 02/21/2008  . ESOPHAGEAL DIVERTICULUM 02/21/2008  . SLEEP APNEA 02/21/2008  . ATHEROSCLEROSIS 07/11/2007  . INGUINAL HERNIAS, BILATERAL 07/11/2007  . HERNIA, UMBILICAL [id number]  . CHOLELITHIASIS 07/11/2007  . UNSPECIFIED RENAL SCLEROSIS 07/11/2007  . TUBULOVILLOUS ADENOMA, COLON 11/02/2005  . GERD 11/02/2005  . DIVERTICULOSIS OF COLON 11/02/2005  . MELANOSIS COLI 11/02/2005    CBC    Component Value Date/Time   WBC 15.0* 01/18/2016 0448   RBC 3.80*  01/18/2016 0448   HGB 10.0* 01/18/2016 0448   HCT 31.8* 01/18/2016 0448   PLT 231 01/18/2016 0448   MCV 83.7 01/18/2016 0448   LYMPHSABS 0.5* 01/16/2016 0153   MONOABS 0.3 01/16/2016 0153   EOSABS 0.1 01/16/2016 0153   BASOSABS 0.0 01/16/2016 0153    CMP     Component Value Date/Time   NA 137 01/19/2016 0238   K 4.2 01/19/2016 0238   CL 99* 01/19/2016 0238   CO2 33* 01/19/2016 0238   GLUCOSE 97 01/19/2016 0238   BUN 22* 01/19/2016 0238   CREATININE 1.06 01/19/2016 0238   CALCIUM 8.3* 01/19/2016 0238   PROT 6.2* 01/16/2016 0153   ALBUMIN 2.4* 01/16/2016 0153   AST 19 01/16/2016 0153   ALT 10* 01/16/2016 0153   ALKPHOS 98 01/16/2016 0153   BILITOT 1.1 01/16/2016 0153   GFRNONAA >60 01/19/2016 0238   GFRAA >60 01/19/2016 0238    Lab Results  Component Value Date   HGBA1C 6.4* 10/07/2014     Dg Chest 2 View  01/15/2016  CLINICAL DATA:  Altered mental status. Recent treatment for pneumonia. Productive cough. History of COPD and hypertension. EXAM: CHEST  2 VIEW COMPARISON:  07/10/2015 FINDINGS: Cardiac enlargement with mild pulmonary vascular congestion. Interstitial changes in the lung bases likely representing mild edema. There is superimposed airspace disease in the left lung base with small left pleural effusion likely representing superimposed pneumonia. No pneumothorax. Calcified and tortuous aorta. Degenerative changes in the spine and shoulders. IMPRESSION: Cardiac enlargement with pulmonary vascular congestion and mild interstitial edema. Superimposed airspace consolidation in the left lung base with small left pleural effusion likely representing pneumonia. Electronically Signed   By: Lucienne Capers M.D.   On: 01/15/2016 21:14    Not all labs, radiology exams or other studies done during hospitalization come through on my EPIC note; however they are reviewed by me.    Assessment and Plan  Sepsis due to pneumonia - From MRSA HCAP.  - D/C on doxycycline to  complete a 3 week course of treatment. - Strep pneumonia antigen negative. - Follow-up blood cultures. - Influenza panel negative. SNF - cont doxycycline for 3 more weeks  MRSA pneumonia ([place]) - From MRSA HCAP.  SNF - cont  doxycycline to complete a 3 week course of treatment.   COPD with acute exacerbation (HCC) S/P 1 dose of Solu-Medrol. Continue bronchodilators and supplemental oxygen. - Respiratory status stable. SNF - not a retainer; cont o2 and nebs as needed  Atherosclerotic PVD with ulceration SNF - Continue aspirin and statin.  Moderate aortic stenosis Monitored for signs of fluid overload. Stable. Last Echo done 07/03/15.   Nonsustained ventricular tachycardia ([place]) Occurred in the setting of hypomagnesemia. Magnesium replaced. SNF - will follow Mg+ levels  Type 2 diabetes mellitus with stage  3 chronic kidney disease ([place]) SNF -Continue Trajenta. CBGs controlled, 86-113; pt on statin   Hypothyroidism SNF - not stated as uncontrolled;cont synthroid 112 mcg  Diabetic neuropathy (HCC) SNF -cont neurontin 300 mg TID  GERD SNF - not stated as uncontrolled;cont prilosec 20 mg daily  Hypomagnesemia SNF - felt to have led to VT; will monitor Mg++  Hyperlipidemia SNF - cont zocor 10 mg daily  Bipolar disorder (HCC) SNF - stable;cont zoloft and seroquel  Wounds, multiple open, lower extremity SNF - BILATERAL;Wound care nurse following; unaboots   Time spent > 45 min;> 50% of time with patient was spent reviewing records, labs, tests and studies, counseling and developing plan of care  Hennie Duos, MD

## 2016-01-19 NOTE — Clinical Social Work Note (Signed)
CSW spoke with [place] in admissions at [facility] and confirmed pt can return today. Discharge packet left with unit RN. CSW will arrange transportation via [facility]. Pt's wife has been notified of transport.    Cindra Presume, LCSW [phone] Hospital psychiatric & 5E, 5W [id number] Licensed Clinical Social Worker

## 2016-01-19 NOTE — Progress Notes (Signed)
CRITICAL VALUE ALERT  Critical value received:  Respiratory culture MRSA +  Date of notification:  01/19/2016   Time of notification:  0840  Critical value read back: yes  Nurse who received alert:  Maylon Cos, RN  MD notified (1st page):  Rama  Time of first page:  [phone]  MD notified (2nd page):  Time of second page:  Responding MD:    Time MD responded:

## 2016-01-19 NOTE — Progress Notes (Signed)
Utilization review completed and faxed via EPIC

## 2016-01-21 LAB — CULTURE, BLOOD (ROUTINE X 2)
CULTURE: NO GROWTH
Culture: NO GROWTH

## 2016-01-23 ENCOUNTER — Encounter: Payer: Self-pay | Admitting: Internal Medicine

## 2016-01-23 DIAGNOSIS — S81809A Unspecified open wound, unspecified lower leg, initial encounter: Secondary | ICD-10-CM | POA: Insufficient documentation

## 2016-01-23 NOTE — Assessment & Plan Note (Signed)
-   From MRSA HCAP.  SNF - cont  doxycycline to complete a 3 week course of treatment.

## 2016-01-23 NOTE — Assessment & Plan Note (Signed)
SNF - not stated as uncontrolled;cont prilosec 20 mg daily

## 2016-01-23 NOTE — Assessment & Plan Note (Signed)
SNF - felt to have led to VT; will monitor Mg++

## 2016-01-23 NOTE — Assessment & Plan Note (Signed)
SNF - not stated as uncontrolled;cont synthroid 112 mcg

## 2016-01-23 NOTE — Assessment & Plan Note (Signed)
Occurred in the setting of hypomagnesemia. Magnesium replaced. SNF - will follow Mg+ levels

## 2016-01-23 NOTE — Assessment & Plan Note (Signed)
-   From MRSA HCAP.  - D/C on doxycycline to complete a 3 week course of treatment. - Strep pneumonia antigen negative. - Follow-up blood cultures. - Influenza panel negative. SNF - cont doxycycline for 3 more weeks

## 2016-01-23 NOTE — Assessment & Plan Note (Signed)
SNF - cont neurontin 300 mg TID 

## 2016-01-23 NOTE — Assessment & Plan Note (Signed)
SNF - stable;cont zoloft and seroquel

## 2016-01-23 NOTE — Assessment & Plan Note (Signed)
SNF - Continue aspirin and statin.

## 2016-01-23 NOTE — Assessment & Plan Note (Signed)
S/P 1 dose of Solu-Medrol. Continue bronchodilators and supplemental oxygen. - Respiratory status stable. SNF - not a retainer; cont o2 and nebs as needed

## 2016-01-23 NOTE — Assessment & Plan Note (Signed)
SNF -Continue Trajenta. CBGs controlled, 86-113; pt on statin

## 2016-01-23 NOTE — Assessment & Plan Note (Addendum)
SNF - BILATERAL;Wound care nurse following; unaboots

## 2016-01-23 NOTE — Assessment & Plan Note (Signed)
Monitored for signs of fluid overload. Stable. Last Echo done 07/03/15.

## 2016-01-23 NOTE — Assessment & Plan Note (Signed)
SNF - cont zocor 10 mg daily

## 2016-01-27 ENCOUNTER — Other Ambulatory Visit (HOSPITAL_COMMUNITY): Payer: Self-pay | Admitting: Internal Medicine

## 2016-01-27 DIAGNOSIS — R1310 Dysphagia, unspecified: Secondary | ICD-10-CM

## 2016-02-04 ENCOUNTER — Ambulatory Visit (HOSPITAL_COMMUNITY)
Admission: RE | Admit: 2016-02-04 | Discharge: 2016-02-04 | Disposition: A | Discharge status: Home / Self Care | Payer: Medicare Other | Source: Ambulatory Visit | Attending: Internal Medicine | Admitting: Internal Medicine

## 2016-02-04 DIAGNOSIS — J449 Chronic obstructive pulmonary disease, unspecified: Secondary | ICD-10-CM | POA: Diagnosis not present

## 2016-02-04 DIAGNOSIS — R1310 Dysphagia, unspecified: Secondary | ICD-10-CM

## 2016-02-04 DIAGNOSIS — G20 Parkinson's disease: Secondary | ICD-10-CM | POA: Diagnosis not present

## 2016-02-06 ENCOUNTER — Observation Stay (HOSPITAL_COMMUNITY)
Admission: EM | Admit: 2016-02-06 | Discharge: 2016-02-10 | Disposition: A | Discharge status: Skilled Nursing Facility | Payer: Medicare Other | Attending: Internal Medicine | Admitting: Internal Medicine

## 2016-02-06 ENCOUNTER — Encounter (HOSPITAL_COMMUNITY): Payer: Self-pay | Admitting: Nurse Practitioner

## 2016-02-06 ENCOUNTER — Emergency Department (HOSPITAL_COMMUNITY): Payer: Medicare Other

## 2016-02-06 DIAGNOSIS — I5032 Chronic diastolic (congestive) heart failure: Secondary | ICD-10-CM | POA: Diagnosis not present

## 2016-02-06 DIAGNOSIS — Z7982 Long term (current) use of aspirin: Secondary | ICD-10-CM | POA: Insufficient documentation

## 2016-02-06 DIAGNOSIS — Z79899 Other long term (current) drug therapy: Secondary | ICD-10-CM | POA: Diagnosis not present

## 2016-02-06 DIAGNOSIS — L89620 Pressure ulcer of left heel, unstageable: Secondary | ICD-10-CM | POA: Diagnosis not present

## 2016-02-06 DIAGNOSIS — J189 Pneumonia, unspecified organism: Principal | ICD-10-CM | POA: Insufficient documentation

## 2016-02-06 DIAGNOSIS — I350 Nonrheumatic aortic (valve) stenosis: Secondary | ICD-10-CM | POA: Insufficient documentation

## 2016-02-06 DIAGNOSIS — E039 Hypothyroidism, unspecified: Secondary | ICD-10-CM | POA: Diagnosis not present

## 2016-02-06 DIAGNOSIS — N183 Chronic kidney disease, stage 3 unspecified: Secondary | ICD-10-CM | POA: Insufficient documentation

## 2016-02-06 DIAGNOSIS — Z95820 Peripheral vascular angioplasty status with implants and grafts: Secondary | ICD-10-CM | POA: Insufficient documentation

## 2016-02-06 DIAGNOSIS — E785 Hyperlipidemia, unspecified: Secondary | ICD-10-CM | POA: Diagnosis not present

## 2016-02-06 DIAGNOSIS — R079 Chest pain, unspecified: Secondary | ICD-10-CM | POA: Insufficient documentation

## 2016-02-06 DIAGNOSIS — I739 Peripheral vascular disease, unspecified: Secondary | ICD-10-CM | POA: Diagnosis not present

## 2016-02-06 DIAGNOSIS — F0390 Unspecified dementia without behavioral disturbance: Secondary | ICD-10-CM | POA: Insufficient documentation

## 2016-02-06 DIAGNOSIS — L98429 Non-pressure chronic ulcer of back with unspecified severity: Secondary | ICD-10-CM | POA: Diagnosis not present

## 2016-02-06 DIAGNOSIS — I130 Hypertensive heart and chronic kidney disease with heart failure and stage 1 through stage 4 chronic kidney disease, or unspecified chronic kidney disease: Secondary | ICD-10-CM | POA: Diagnosis not present

## 2016-02-06 DIAGNOSIS — M19041 Primary osteoarthritis, right hand: Secondary | ICD-10-CM | POA: Diagnosis not present

## 2016-02-06 DIAGNOSIS — I471 Supraventricular tachycardia: Secondary | ICD-10-CM | POA: Insufficient documentation

## 2016-02-06 DIAGNOSIS — E1122 Type 2 diabetes mellitus with diabetic chronic kidney disease: Secondary | ICD-10-CM | POA: Diagnosis not present

## 2016-02-06 DIAGNOSIS — E1140 Type 2 diabetes mellitus with diabetic neuropathy, unspecified: Secondary | ICD-10-CM | POA: Insufficient documentation

## 2016-02-06 DIAGNOSIS — M19042 Primary osteoarthritis, left hand: Secondary | ICD-10-CM | POA: Insufficient documentation

## 2016-02-06 DIAGNOSIS — K219 Gastro-esophageal reflux disease without esophagitis: Secondary | ICD-10-CM | POA: Diagnosis not present

## 2016-02-06 DIAGNOSIS — Z7951 Long term (current) use of inhaled steroids: Secondary | ICD-10-CM | POA: Insufficient documentation

## 2016-02-06 DIAGNOSIS — J449 Chronic obstructive pulmonary disease, unspecified: Secondary | ICD-10-CM | POA: Insufficient documentation

## 2016-02-06 DIAGNOSIS — D638 Anemia in other chronic diseases classified elsewhere: Secondary | ICD-10-CM | POA: Diagnosis not present

## 2016-02-06 DIAGNOSIS — G20 Parkinson's disease: Secondary | ICD-10-CM | POA: Diagnosis not present

## 2016-02-06 DIAGNOSIS — L89613 Pressure ulcer of right heel, stage 3: Secondary | ICD-10-CM | POA: Diagnosis not present

## 2016-02-06 DIAGNOSIS — I2510 Atherosclerotic heart disease of native coronary artery without angina pectoris: Secondary | ICD-10-CM | POA: Insufficient documentation

## 2016-02-06 DIAGNOSIS — Z7984 Long term (current) use of oral hypoglycemic drugs: Secondary | ICD-10-CM | POA: Insufficient documentation

## 2016-02-06 DIAGNOSIS — Z87891 Personal history of nicotine dependence: Secondary | ICD-10-CM | POA: Insufficient documentation

## 2016-02-06 DIAGNOSIS — Y95 Nosocomial condition: Secondary | ICD-10-CM | POA: Diagnosis not present

## 2016-02-06 DIAGNOSIS — I10 Essential (primary) hypertension: Secondary | ICD-10-CM | POA: Insufficient documentation

## 2016-02-06 DIAGNOSIS — F319 Bipolar disorder, unspecified: Secondary | ICD-10-CM | POA: Insufficient documentation

## 2016-02-06 DIAGNOSIS — L8990 Pressure ulcer of unspecified site, unspecified stage: Secondary | ICD-10-CM | POA: Insufficient documentation

## 2016-02-06 HISTORY — DX: Venous insufficiency (chronic) (peripheral): I87.2

## 2016-02-06 HISTORY — DX: Atherosclerosis of renal artery: I70.1

## 2016-02-06 HISTORY — DX: Ventricular tachycardia: I47.2

## 2016-02-06 HISTORY — DX: Supraventricular tachycardia: I47.1

## 2016-02-06 HISTORY — DX: Chronic diastolic (congestive) heart failure: I50.32

## 2016-02-06 HISTORY — DX: Unspecified right bundle-branch block: I45.10

## 2016-02-06 HISTORY — DX: Other ventricular tachycardia: I47.29

## 2016-02-06 HISTORY — DX: Type 2 diabetes mellitus with diabetic neuropathy, unspecified: E11.40

## 2016-02-06 HISTORY — DX: Other supraventricular tachycardia: I47.19

## 2016-02-06 HISTORY — DX: Bipolar disorder, unspecified: F31.9

## 2016-02-06 HISTORY — DX: Nonrheumatic aortic (valve) stenosis: I35.0

## 2016-02-06 LAB — COMPREHENSIVE METABOLIC PANEL
ALT: 11 U/L — AB (ref 17–63)
AST: 17 U/L (ref 15–41)
Albumin: 2.7 g/dL — ABNORMAL LOW (ref 3.5–5.0)
Alkaline Phosphatase: 79 U/L (ref 38–126)
Anion gap: 15 (ref 5–15)
BUN: 31 mg/dL — AB (ref 6–20)
CHLORIDE: 98 mmol/L — AB (ref 101–111)
CO2: 31 mmol/L (ref 22–32)
CREATININE: 1.78 mg/dL — AB (ref 0.61–1.24)
Calcium: 8.1 mg/dL — ABNORMAL LOW (ref 8.9–10.3)
GFR calc Af Amer: 38 mL/min — ABNORMAL LOW (ref >60)
GFR, EST NON AFRICAN AMERICAN: 33 mL/min — AB (ref >60)
Glucose, Bld: 119 mg/dL — ABNORMAL HIGH (ref 65–99)
Potassium: 3.4 mmol/L — ABNORMAL LOW (ref 3.5–5.1)
SODIUM: 144 mmol/L (ref 135–145)
Total Bilirubin: 0.8 mg/dL (ref 0.3–1.2)
Total Protein: 6.6 g/dL (ref 6.5–8.1)

## 2016-02-06 LAB — CBC
HCT: 32.6 % — ABNORMAL LOW (ref 39.0–52.0)
Hemoglobin: 9.9 g/dL — ABNORMAL LOW (ref 13.0–17.0)
MCH: 24.7 pg — ABNORMAL LOW (ref 26.0–34.0)
MCHC: 30.4 g/dL (ref 30.0–36.0)
MCV: 81.3 fL (ref 78.0–100.0)
PLATELETS: 194 10*3/uL (ref 150–400)
RBC: 4.01 MIL/uL — AB (ref 4.22–5.81)
RDW: 16.2 % — ABNORMAL HIGH (ref 11.5–15.5)
WBC: 12 10*3/uL — AB (ref 4.0–10.5)

## 2016-02-06 LAB — PROTIME-INR
INR: 1.24 (ref 0.00–1.49)
Prothrombin Time: 15.7 seconds — ABNORMAL HIGH (ref 11.6–15.2)

## 2016-02-06 LAB — I-STAT TROPONIN, ED
Troponin i, poc: 0 ng/mL (ref 0.00–0.08)
Troponin i, poc: 0.01 ng/mL (ref 0.00–0.08)

## 2016-02-06 LAB — APTT: aPTT: 33 seconds (ref 24–37)

## 2016-02-06 MED ORDER — MELATONIN-PYRIDOXINE 3-1 MG PO TABS: ORAL_TABLET | Freq: Every day | ORAL | Status: DC

## 2016-02-06 MED ORDER — FLUCONAZOLE 100 MG PO TABS
100.0000 mg | ORAL_TABLET | Freq: Every day | ORAL | Status: DC
  Administered 2016-02-07 – 2016-02-10 (×4): 100 mg via ORAL
  Filled 2016-02-06 (×4): qty 1

## 2016-02-06 MED ORDER — ADULT MULTIVITAMIN W/MINERALS CH
1.0000 | ORAL_TABLET | Freq: Every day | ORAL | Status: DC
  Administered 2016-02-07 – 2016-02-10 (×4): 1 via ORAL
  Filled 2016-02-06 (×4): qty 1

## 2016-02-06 MED ORDER — VANCOMYCIN HCL 10 G IV SOLR
2000.0000 mg | Freq: Once | INTRAVENOUS | Status: AC
  Administered 2016-02-06: 2000 mg via INTRAVENOUS
  Filled 2016-02-06: qty 2000

## 2016-02-06 MED ORDER — FENTANYL 50 MCG/HR TD PT72
50.0000 ug | MEDICATED_PATCH | TRANSDERMAL | Status: DC
  Administered 2016-02-07 – 2016-02-10 (×2): 50 ug via TRANSDERMAL
  Filled 2016-02-06 (×2): qty 1

## 2016-02-06 MED ORDER — ENOXAPARIN SODIUM 30 MG/0.3ML ~~LOC~~ SOLN
30.0000 mg | SUBCUTANEOUS | Status: DC
  Administered 2016-02-07 – 2016-02-08 (×2): 30 mg via SUBCUTANEOUS
  Filled 2016-02-06 (×2): qty 0.3

## 2016-02-06 MED ORDER — ASPIRIN EC 81 MG PO TBEC
81.0000 mg | DELAYED_RELEASE_TABLET | Freq: Every day | ORAL | Status: DC
  Administered 2016-02-07 – 2016-02-10 (×4): 81 mg via ORAL
  Filled 2016-02-06 (×4): qty 1

## 2016-02-06 MED ORDER — PIPERACILLIN-TAZOBACTAM 3.375 G IVPB
3.3750 g | Freq: Three times a day (TID) | INTRAVENOUS | Status: DC
  Administered 2016-02-07 – 2016-02-09 (×8): 3.375 g via INTRAVENOUS
  Filled 2016-02-06 (×10): qty 50

## 2016-02-06 MED ORDER — DILTIAZEM HCL ER 120 MG PO CP24: 120.0000 mg | ORAL_CAPSULE | Freq: Every day | ORAL | Status: DC

## 2016-02-06 MED ORDER — ENSURE ENLIVE PO LIQD
1.0000 | Freq: Three times a day (TID) | ORAL | Status: DC
  Administered 2016-02-07 – 2016-02-10 (×9): 237 mL via ORAL

## 2016-02-06 MED ORDER — PANTOPRAZOLE SODIUM 40 MG PO TBEC
40.0000 mg | DELAYED_RELEASE_TABLET | Freq: Every day | ORAL | Status: DC
  Administered 2016-02-07 – 2016-02-10 (×4): 40 mg via ORAL
  Filled 2016-02-06 (×4): qty 1

## 2016-02-06 MED ORDER — QUETIAPINE FUMARATE 25 MG PO TABS
25.0000 mg | ORAL_TABLET | Freq: Every day | ORAL | Status: DC
  Administered 2016-02-07 – 2016-02-09 (×4): 25 mg via ORAL
  Filled 2016-02-06 (×4): qty 1

## 2016-02-06 MED ORDER — FUROSEMIDE 40 MG PO TABS
40.0000 mg | ORAL_TABLET | Freq: Two times a day (BID) | ORAL | Status: DC
  Administered 2016-02-07 – 2016-02-10 (×4): 40 mg via ORAL
  Filled 2016-02-06 (×2): qty 1
  Filled 2016-02-06: qty 2
  Filled 2016-02-06 (×2): qty 1
  Filled 2016-02-06 (×3): qty 2

## 2016-02-06 MED ORDER — ACETAMINOPHEN 325 MG PO TABS
650.0000 mg | ORAL_TABLET | Freq: Four times a day (QID) | ORAL | Status: DC | PRN
  Administered 2016-02-08: 650 mg via ORAL
  Filled 2016-02-06: qty 2

## 2016-02-06 MED ORDER — LORAZEPAM 0.5 MG PO TABS
0.5000 mg | ORAL_TABLET | Freq: Three times a day (TID) | ORAL | Status: DC
  Administered 2016-02-07 – 2016-02-10 (×11): 0.5 mg via ORAL
  Filled 2016-02-06 (×11): qty 1

## 2016-02-06 MED ORDER — MOMETASONE FURO-FORMOTEROL FUM 100-5 MCG/ACT IN AERO
2.0000 | INHALATION_SPRAY | Freq: Two times a day (BID) | RESPIRATORY_TRACT | Status: DC
  Administered 2016-02-07 – 2016-02-10 (×6): 2 via RESPIRATORY_TRACT
  Filled 2016-02-06 (×2): qty 8.8

## 2016-02-06 MED ORDER — SODIUM CHLORIDE 0.9 % IV SOLN
INTRAVENOUS | Status: DC
  Administered 2016-02-07: 02:00:00 via INTRAVENOUS

## 2016-02-06 MED ORDER — PIPERACILLIN-TAZOBACTAM 3.375 G IVPB 30 MIN
3.3750 g | Freq: Once | INTRAVENOUS | Status: AC
  Administered 2016-02-07: 3.375 g via INTRAVENOUS
  Filled 2016-02-06: qty 50

## 2016-02-06 MED ORDER — OXYCODONE HCL 5 MG PO TABS
5.0000 mg | ORAL_TABLET | Freq: Four times a day (QID) | ORAL | Status: DC | PRN
Start: 2016-02-06 — End: 2016-02-08
  Administered 2016-02-07 – 2016-02-08 (×3): 5 mg via ORAL
  Filled 2016-02-06 (×3): qty 1

## 2016-02-06 MED ORDER — IPRATROPIUM-ALBUTEROL 0.5-2.5 (3) MG/3ML IN SOLN: 3.0000 mL | Freq: Four times a day (QID) | RESPIRATORY_TRACT | Status: DC | PRN

## 2016-02-06 MED ORDER — CARBIDOPA-LEVODOPA 10-100 MG PO TABS
1.0000 | ORAL_TABLET | Freq: Every day | ORAL | Status: DC
  Administered 2016-02-07 – 2016-02-09 (×4): 1 via ORAL
  Filled 2016-02-06 (×5): qty 1

## 2016-02-06 MED ORDER — LINAGLIPTIN 5 MG PO TABS
5.0000 mg | ORAL_TABLET | Freq: Every day | ORAL | Status: DC
  Administered 2016-02-07 – 2016-02-10 (×4): 5 mg via ORAL
  Filled 2016-02-06 (×4): qty 1

## 2016-02-06 MED ORDER — GLUCERNA PO LIQD: 237.0000 mL | ORAL | Status: DC

## 2016-02-06 MED ORDER — ASPIRIN 81 MG PO CHEW
324.0000 mg | CHEWABLE_TABLET | Freq: Once | ORAL | Status: AC
  Administered 2016-02-06: 324 mg via ORAL
  Filled 2016-02-06: qty 4

## 2016-02-06 MED ORDER — LEVOTHYROXINE SODIUM 112 MCG PO TABS
112.0000 ug | ORAL_TABLET | Freq: Every day | ORAL | Status: DC
  Administered 2016-02-07 – 2016-02-10 (×4): 112 ug via ORAL
  Filled 2016-02-06 (×4): qty 1

## 2016-02-06 MED ORDER — BISACODYL 10 MG RE SUPP: 10.0000 mg | Freq: Every day | RECTAL | Status: DC | PRN

## 2016-02-06 MED ORDER — POTASSIUM CHLORIDE CRYS ER 20 MEQ PO TBCR
20.0000 meq | EXTENDED_RELEASE_TABLET | Freq: Two times a day (BID) | ORAL | Status: DC
  Administered 2016-02-07 – 2016-02-10 (×7): 20 meq via ORAL
  Filled 2016-02-06 (×8): qty 1

## 2016-02-06 MED ORDER — DILTIAZEM HCL ER COATED BEADS 240 MG PO CP24: 240.0000 mg | ORAL_CAPSULE | Freq: Every day | ORAL | Status: DC

## 2016-02-06 MED ORDER — CEFEPIME HCL 1 G IJ SOLR
1.0000 g | Freq: Once | INTRAMUSCULAR | Status: DC
  Filled 2016-02-06: qty 1

## 2016-02-06 MED ORDER — SIMVASTATIN 10 MG PO TABS
10.0000 mg | ORAL_TABLET | Freq: Every day | ORAL | Status: DC
  Administered 2016-02-07 – 2016-02-09 (×4): 10 mg via ORAL
  Filled 2016-02-06 (×4): qty 1

## 2016-02-06 MED ORDER — GABAPENTIN 300 MG PO CAPS
300.0000 mg | ORAL_CAPSULE | Freq: Three times a day (TID) | ORAL | Status: DC
  Administered 2016-02-07 – 2016-02-10 (×11): 300 mg via ORAL
  Filled 2016-02-06 (×11): qty 1

## 2016-02-06 NOTE — H&P (Signed)
History and Physical  [name] [id number] DOB: 1928/12/27 DOA: 02/06/2016  PCP: [name], MD   Chief Complaint: chest pain  History of Present Illness:  Patient is a 82 yo male with history of COPD, hypothyroidism, HTN, CKD, DM, CAD, dementia who was brought here from nursing home with cc of chest pain. When I asked him he said the pain was across his chest but now it's gone. He is not active so not sure it was not exertional. It was not positional. He has mild dyspnea. No fever, chills, cough, sore throat. He has chronic bed sores and they are painful. No N/V/D/C/abd pain/dysuria. Otherwise no complaints.   Review of Systems:  CONSTITUTIONAL:    No fever or chills. Eyes:  No visual changes.  ENT:    No epistaxis.  No sinus pain.  RESPIRATORY:  No cough.  No wheeze.  No hemoptysis.  +shortness of breath. CARDIOVASCULAR:  +chest pains.  No palpitations. GASTROINTESTINAL:  No abdominal pain.  No nausea or vomiting.  No diarrhea or constipation.  No hematemesis.  No hematochezia.  No melena. GENITOURINARY:  No urgency.  No frequency.  No dysuria.  No hematuria.  No obstructive symptoms.  No discharge.  No pain.  No significant abnormal bleeding. MUSCULOSKELETAL:  No musculoskeletal pain.  No joint swelling.  No arthritis. NEUROLOGICAL:  No confusion.  No weakness. No headache. No seizure. PSYCHIATRIC:  No depression. No anxiety. No suicidal ideation. SKIN:    +wounds. ENDOCRINE:  No unexplained weight loss.  No polydipsia.  No polyuria.  No polyphagia. HEMATOLOGIC:  No anemia.  No purpura.  No petechiae.  No bleeding.  ALLERGIC AND IMMUNOLOGIC:  No pruritus.  No swelling Other:  Past Medical and Surgical History:   Past Medical History  Diagnosis Date  . Hyperlipidemia   . COPD (chronic obstructive pulmonary disease) ([place])   . Leg pain   . GERD (gastroesophageal reflux disease)   . Depression   . Dizziness   . Productive cough   . Thyroid disease   .  Peripheral vascular disease ([place])   . ED (erectile dysfunction)   . Hypertension     08/17/12 - wife denies  . Walking pneumonia 2000's  . H/O hiatal hernia   . Migraine     "when I was a young boy"  . Arthritis     "hands" (08/28/2014)  . Anxiety   . Hypothyroidism   . Chronic kidney disease (CKD), stage III (moderate)     [name] 08/28/2014   Past Surgical History  Procedure Laterality Date  . Renal artery stent Left   . Kidney stone surgery  1989  . Lower extremity angiogram Left 03-20-2014    Dr. Tinnie Gens  . Tonsillectomy    . Cataract extraction, bilateral Bilateral   . Blepharoplasty Right   . Abdominal aortagram N/A 03/20/2013    Procedure: ABDOMINAL Maxcine Ham;  Surgeon: [name], MD;  Location: [facility] CATH LAB;  Service: Cardiovascular;  Laterality: N/A;  . Lower extremity angiogram Left 03/20/2013    Procedure: LOWER EXTREMITY ANGIOGRAM;  Surgeon: [name], MD;  Location: [facility] CATH LAB;  Service: Cardiovascular;  Laterality: Left;    Social History:   reports that he quit smoking about 3 years ago. His smoking use included Cigarettes. He has a 25 pack-year smoking history. He has never used smokeless tobacco. He reports that he drinks alcohol. He reports that he does not use illicit drugs.   No Known Allergies  Family  History  Problem Relation Age of Onset  . Other Mother     AAA  . AAA (abdominal aortic aneurysm) Mother   . Heart attack Father   . Alcohol abuse Father   . Diabetes Father   . Other Father     amputation  . Diabetes Brother   . Coronary artery disease Brother   . Dementia Brother       Prior to Admission medications   Medication Sig Start Date End Date Taking? Authorizing Provider  acetaminophen (TYLENOL) 325 MG tablet Take 650 mg by mouth every 6 (six) hours as needed for mild pain, moderate pain, fever or headache.    Yes Historical Provider, MD  aspirin EC 81 MG tablet Take 81 mg by mouth daily.    Yes Historical Provider, MD    bisacodyl (DULCOLAX) 10 MG suppository Place 1 suppository (10 mg total) rectally once. Patient taking differently: Place 10 mg rectally daily as needed for moderate constipation.  01/19/16  Yes Christina P Rama, MD  carbidopa-levodopa (SINEMET) 10-100 MG per tablet Take 1 tablet by mouth at bedtime.    Yes Historical Provider, MD  diltiazem (CARDIZEM CD) 240 MG 24 hr capsule Take 240 mg by mouth daily. Take with a 120 mg capsule for a 360 mg dose   Yes Historical Provider, MD  diltiazem (DILACOR XR) 120 MG 24 hr capsule Take 120 mg by mouth daily. Take with a 240 mg capsule for a 360 mg dose   Yes Historical Provider, MD  doxycycline (VIBRAMYCIN) 100 MG capsule Take 100 mg by mouth 2 (two) times daily. Per MAR start date 01/27/16, stop date 02/16/16   Yes Historical Provider, MD  feeding supplement (ENSURE) PUDG Take 1 Container by mouth 3 (three) times daily between meals. Patient taking differently: Take 1 Container by mouth See admin instructions. Offer as afternoon and bedtime snack 08/23/12  Yes Shon Baton, MD  fentaNYL (DURAGESIC - DOSED MCG/HR) 50 MCG/HR Place 1 patch (50 mcg total) onto the skin every 3 (three) days. 12/22/15  Yes Estill Dooms, MD  fluconazole (DIFLUCAN) 100 MG tablet Take 100 mg by mouth daily. 14 day course started 02/03/16   Yes Historical Provider, MD  Fluticasone-Salmeterol (ADVAIR) 250-50 MCG/DOSE AEPB Inhale 1 puff into the lungs 2 (two) times daily. Rinse mouth after inhalation and spit out   Yes Historical Provider, MD  furosemide (LASIX) 40 MG tablet Take 1 tablet (40 mg total) by mouth 2 (two) times daily. Patient taking differently: Take 40 mg by mouth 2 (two) times daily. 6am and 4:30pm 07/12/15  Yes Nita Sells, MD  gabapentin (NEURONTIN) 300 MG capsule Take 1 capsule (300 mg total) by mouth 2 (two) to 3 (three)  times daily. Patient taking differently: Take 300 mg by mouth 3 (three) times daily. 10am, 4pm, 10pm 09/02/14  Yes Shon Baton, MD  GLUCERNA [facility])  LIQD Take 237 mLs by mouth See admin instructions. Offer 1 can (237 mls) by mouth between breakfast and lunch   Yes Historical Provider, MD  ipratropium (ATROVENT) 0.02 % nebulizer solution Take 0.5 mg by nebulization every 6 (six) hours as needed for shortness of breath.    Yes Historical Provider, MD  ipratropium-albuterol (DUONEB) 0.5-2.5 (3) MG/3ML SOLN Take 3 mLs by nebulization every 6 (six) hours as needed (shortness of breathe.).   Yes Historical Provider, MD  levothyroxine (SYNTHROID, LEVOTHROID) 112 MCG tablet Take 112 mcg by mouth daily before breakfast.    Yes Historical Provider, MD  linagliptin (  TRADJENTA) 5 MG TABS tablet Take 1 tablet (5 mg total) by mouth daily. 10/09/14  Yes Shon Baton, MD  LORazepam (ATIVAN) 0.5 MG tablet Take 1 tablet (0.5 mg total) by mouth 3 (three) times daily. Patient taking differently: Take 0.5 mg by mouth 3 (three) times daily. 8am, 12pm, 8pm 01/19/16  Yes Venetia Maxon Rama, MD  Magnesium Hydroxide (MILK OF MAGNESIA PO) Take 30 mLs by mouth daily as needed (mild constipation).   Yes Historical Provider, MD  Melatonin-Pyridoxine (MELATIN PO) Take 1 tablet by mouth at bedtime.    Yes Historical Provider, MD  Multiple Vitamin (MULTIVITAMIN WITH MINERALS) TABS tablet Take 1 tablet by mouth daily.   Yes Historical Provider, MD  omeprazole (PRILOSEC) 20 MG capsule Take 20 mg by mouth daily before breakfast.    Yes Historical Provider, MD  oxycodone (OXY-IR) 5 MG capsule Take 1 capsule (5 mg total) by mouth every 6 (six) hours as needed for pain. 01/19/16  Yes Venetia Maxon Rama, MD  polyethylene glycol (MIRALAX / GLYCOLAX) packet Take 17 g by mouth daily. Patient taking differently: Take 17 g by mouth daily. Mix in 4 oz of water and drink 01/19/16  Yes Christina P Rama, MD  potassium chloride SA (K-DUR,KLOR-CON) 20 MEQ tablet Take 1 tablet (20 mEq total) by mouth 2 (two) times daily. 01/19/16  Yes Christina P Rama, MD  QUEtiapine (SEROQUEL) 25 MG tablet Take 1 tablet (25  mg total) by mouth at bedtime. 01/19/16  Yes Christina P Rama, MD  sertraline (ZOLOFT) 100 MG tablet Take 100 mg by mouth at bedtime.    Yes Historical Provider, MD  simvastatin (ZOCOR) 10 MG tablet Take 10 mg by mouth at bedtime.    Yes Historical Provider, MD    Physical Exam: BP 142/76 mmHg  Pulse 77  Temp(Src) 98.5 F (36.9 C) (Axillary)  Resp 19  SpO2 96%  GENERAL :  alert and cooperative, and appears to be in no acute distress. HEAD: normocephalic. EYES: PERRL, EOMI.  NOSE: No nasal discharge. THROAT: Oral cavity and pharynx normal.  NECK: Neck supple, non-tender without lymphadenopathy, masses or thyromegaly. CARDIAC: Normal S1 and S2. No S3, S4. Mid systolic murmur. Rhythm is irregular. There is + peripheral edema Lungs: mild crackles in RLL. ABDOMEN: Positive bowel sounds. Soft, nondistended, nontender.  MUSKULOSKELETAL: LEs: wrapped. Examined earlier by ED physician. There was heel ulcers and bilateral edema.  NEUROLOGICAL: The mental examination revealed the patient was oriented to person, place SKIN: stomatitis. Stage I ulcer in lower back area.            Labs on Admission:  Reviewed.   Radiological Exams on Admission: Dg Chest Portable 1 View  02/06/2016  CLINICAL DATA:  Acute on chronic left-sided chest pain, former smoker EXAM: PORTABLE CHEST 1 VIEW COMPARISON:  01/15/2016 FINDINGS: Moderate cardiac enlargement similar to prior study infiltrate left lower lobe less severe when compared to the prior examination. Infiltrate right lower lobe similar to prior study. Vascular pattern normal. Bilateral shoulder arthropathy. IMPRESSION: Findings consistent with persistent but improved left lower lobe pneumonia. Also suspect right lower lobe pneumonia. Hyper attenuation to the right of midline new the medial aspect of the right diaphragm likely represents residual barium within distal esophagus given some rotation of the patient. Patient had a barium study performed  02/04/2016. Electronically Signed   By: Skipper Cliche M.D.   On: 02/06/2016 19:56    EKG:  Independently reviewed. PACs. No new ST ischemic changes.   Assessment/Plan  HCAP (healthcare-associated pneumonia) Started on vanc/zosyn.  Patient was recently treated for HCAP.   Chest  Pain:  Likely due to above Will check serial trop/EKG EKG is non ischemic Keep on tele Continue statin and asp  COPD  Continue albuterol/ipratropium as needed Not in exacerbation  Continue inhaled steroids/LABA    Hypothyroidism Continue levothyroxine.    GERD Continue Protonix 40 mg daily.   Wounds:  Continue local wound care.   DM2  Continue home meds.    DVT prophylaxis: [place] enoxaparin  GI prophylaxis:PPI  Code Status: Full      Gennaro Africa M.D [facility]

## 2016-02-06 NOTE — ED Notes (Signed)
Portable at bedside 

## 2016-02-06 NOTE — ED Notes (Signed)
Pt requesting water.  Told him we would defer it for now awaiting test results. He was accepting of that.

## 2016-02-06 NOTE — ED Notes (Signed)
MD at bedside. 

## 2016-02-06 NOTE — ED Notes (Signed)
Pt from [place] and rehab to be evaluated for chest pain. CP started 3 days ago, was intermittent, central non-radiating. Facility gave 1 SL nitro with complete relief. EMS gave 324 of ASA. Patient denies any associated complaints.

## 2016-02-06 NOTE — Progress Notes (Addendum)
Pharmacy Antibiotic Note  [name] is a 82 y.o. male admitted on 02/06/2016 with pneumonia.  Pharmacy has been consulted for vancomycin/Zosyn dosing. According to medical record, chest x-ray suggests a persistent left lower lobe pneumonia as well as a new right lower lobe pneumonia. estCrCl 30-40, based off of last weight in January ~93 kg  Plan: Give vancomycin 2 grams IV x 1 dose Plan for maintenance dose of vancomycin 1000 mg q 24 hr Give Zosyn 3.375 g x 1 dose (over 30 minutes), followed by Zosyn 3.375 gram q 8 hours (extended infusion)   Temp (24hrs), Avg:98.5 F (36.9 C), Min:98.5 F (36.9 C), Max:98.5 F (36.9 C)   Recent Labs Lab 02/06/16 1952  WBC 12.0*  CREATININE 1.78*    CrCl cannot be calculated (Unknown ideal weight.).    No Known Allergies  Antimicrobials this admission: 2/18 vancomycin >>  2/18 Zosyn >>  Dose adjustments this admission: none  Microbiology results:   Thank you for allowing [place] to participate in this patients care. Jens Som, PharmD Pager: [phone] 02/06/2016 10:18 PM

## 2016-02-06 NOTE — ED Provider Notes (Signed)
CSN: [id number]     Arrival date & time 02/06/16  1854 History   First MD Initiated Contact with Patient 02/06/16 1856     Chief Complaint  Patient presents with  . Chest Pain   HPI Patient presents to the emergency room for evaluation of chest pain. Patient's currently residing in a nursing care facility. According to the EMS report patient started having chest pain 3 days ago but only mentioned it today to the nursing staff. The patient is unable to tell me exactly when the symptoms started. He says he's had trouble with his chest off and on for years of months. He is not sure which exactly. He says today he started having discomfort in the left side of his chest that went to the back. He can't describe the nature of the pain. He denies any pain currently. Denies any trouble with shortness of breath. No nausea or vomiting. Patient does have chronic leg swelling. He also complains of soreness on his buttock. He denies any fevers or chills. No vomiting or diarrhea. Past Medical History  Diagnosis Date  . Hyperlipidemia   . COPD (chronic obstructive pulmonary disease) ([place])   . Leg pain   . GERD (gastroesophageal reflux disease)   . Depression   . Dizziness   . Productive cough   . Thyroid disease   . Peripheral vascular disease ([place])   . ED (erectile dysfunction)   . Hypertension     08/17/12 - wife denies  . Walking pneumonia 2000's  . H/O hiatal hernia   . Migraine     "when I was a young boy"  . Arthritis     "hands" (08/28/2014)  . Anxiety   . Hypothyroidism   . Chronic kidney disease (CKD), stage III (moderate)     [name] 08/28/2014   Past Surgical History  Procedure Laterality Date  . Renal artery stent Left   . Kidney stone surgery  1989  . Lower extremity angiogram Left 03-20-2014    Dr. Tinnie Gens  . Tonsillectomy    . Cataract extraction, bilateral Bilateral   . Blepharoplasty Right   . Abdominal aortagram N/A 03/20/2013    Procedure: ABDOMINAL Maxcine Ham;  Surgeon:  [name], MD;  Location: [facility] CATH LAB;  Service: Cardiovascular;  Laterality: N/A;  . Lower extremity angiogram Left 03/20/2013    Procedure: LOWER EXTREMITY ANGIOGRAM;  Surgeon: [name], MD;  Location: [facility] CATH LAB;  Service: Cardiovascular;  Laterality: Left;   Family History  Problem Relation Age of Onset  . Other Mother     AAA  . AAA (abdominal aortic aneurysm) Mother   . Heart attack Father   . Alcohol abuse Father   . Diabetes Father   . Other Father     amputation  . Diabetes Brother   . Coronary artery disease Brother   . Dementia Brother    Social History  Substance Use Topics  . Smoking status: Former Smoker -- 1.00 packs/day for 25 years    Types: Cigarettes    Quit date: 08/17/2012  . Smokeless tobacco: Never Used  . Alcohol Use: 0.0 oz/week    0 Standard drinks or equivalent per week     Comment: 08/28/2014 "last drink was in ~ 2012"    Review of Systems  All other systems reviewed and are negative.     Allergies  Review of patient's allergies indicates no known allergies.  Home Medications   Prior to Admission medications   Medication  Sig Start Date End Date Taking? Authorizing Provider  acetaminophen (TYLENOL) 325 MG tablet Take 650 mg by mouth every 6 (six) hours as needed for mild pain, moderate pain, fever or headache.    Yes Historical Provider, MD  aspirin EC 81 MG tablet Take 81 mg by mouth daily.    Yes Historical Provider, MD  bisacodyl (DULCOLAX) 10 MG suppository Place 1 suppository (10 mg total) rectally once. Patient taking differently: Place 10 mg rectally daily as needed for moderate constipation.  01/19/16  Yes Christina P Rama, MD  carbidopa-levodopa (SINEMET) 10-100 MG per tablet Take 1 tablet by mouth at bedtime.    Yes Historical Provider, MD  diltiazem (CARDIZEM CD) 240 MG 24 hr capsule Take 240 mg by mouth daily. Take with a 120 mg capsule for a 360 mg dose   Yes Historical Provider, MD  diltiazem (DILACOR XR) 120 MG 24 hr  capsule Take 120 mg by mouth daily. Take with a 240 mg capsule for a 360 mg dose   Yes Historical Provider, MD  doxycycline (VIBRAMYCIN) 100 MG capsule Take 100 mg by mouth 2 (two) times daily. Per MAR start date 01/27/16, stop date 02/16/16   Yes Historical Provider, MD  feeding supplement (ENSURE) PUDG Take 1 Container by mouth 3 (three) times daily between meals. Patient taking differently: Take 1 Container by mouth See admin instructions. Offer as afternoon and bedtime snack 08/23/12  Yes Shon Baton, MD  fentaNYL (DURAGESIC - DOSED MCG/HR) 50 MCG/HR Place 1 patch (50 mcg total) onto the skin every 3 (three) days. 12/22/15  Yes Estill Dooms, MD  fluconazole (DIFLUCAN) 100 MG tablet Take 100 mg by mouth daily. 14 day course started 02/03/16   Yes Historical Provider, MD  Fluticasone-Salmeterol (ADVAIR) 250-50 MCG/DOSE AEPB Inhale 1 puff into the lungs 2 (two) times daily. Rinse mouth after inhalation and spit out   Yes Historical Provider, MD  furosemide (LASIX) 40 MG tablet Take 1 tablet (40 mg total) by mouth 2 (two) times daily. Patient taking differently: Take 40 mg by mouth 2 (two) times daily. 6am and 4:30pm 07/12/15  Yes Nita Sells, MD  gabapentin (NEURONTIN) 300 MG capsule Take 1 capsule (300 mg total) by mouth 2 (two) to 3 (three)  times daily. Patient taking differently: Take 300 mg by mouth 3 (three) times daily. 10am, 4pm, 10pm 09/02/14  Yes Shon Baton, MD  GLUCERNA [facility]) LIQD Take 237 mLs by mouth See admin instructions. Offer 1 can (237 mls) by mouth between breakfast and lunch   Yes Historical Provider, MD  ipratropium (ATROVENT) 0.02 % nebulizer solution Take 0.5 mg by nebulization every 6 (six) hours as needed for shortness of breath.    Yes Historical Provider, MD  ipratropium-albuterol (DUONEB) 0.5-2.5 (3) MG/3ML SOLN Take 3 mLs by nebulization every 6 (six) hours as needed (shortness of breathe.).   Yes Historical Provider, MD  levothyroxine (SYNTHROID, LEVOTHROID) 112 MCG  tablet Take 112 mcg by mouth daily before breakfast.    Yes Historical Provider, MD  linagliptin (TRADJENTA) 5 MG TABS tablet Take 1 tablet (5 mg total) by mouth daily. 10/09/14  Yes Shon Baton, MD  LORazepam (ATIVAN) 0.5 MG tablet Take 1 tablet (0.5 mg total) by mouth 3 (three) times daily. Patient taking differently: Take 0.5 mg by mouth 3 (three) times daily. 8am, 12pm, 8pm 01/19/16  Yes Venetia [place] Rama, MD  Magnesium Hydroxide (MILK OF MAGNESIA PO) Take 30 mLs by mouth daily as needed (mild constipation).   Yes  Historical Provider, MD  Melatonin-Pyridoxine (MELATIN PO) Take 1 tablet by mouth at bedtime.    Yes Historical Provider, MD  Multiple Vitamin (MULTIVITAMIN WITH MINERALS) TABS tablet Take 1 tablet by mouth daily.   Yes Historical Provider, MD  omeprazole (PRILOSEC) 20 MG capsule Take 20 mg by mouth daily before breakfast.    Yes Historical Provider, MD  oxycodone (OXY-IR) 5 MG capsule Take 1 capsule (5 mg total) by mouth every 6 (six) hours as needed for pain. 01/19/16  Yes Venetia [place] Rama, MD  polyethylene glycol (MIRALAX / GLYCOLAX) packet Take 17 g by mouth daily. Patient taking differently: Take 17 g by mouth daily. Mix in 4 oz of water and drink 01/19/16  Yes Christina P Rama, MD  potassium chloride SA (K-DUR,KLOR-CON) 20 MEQ tablet Take 1 tablet (20 mEq total) by mouth 2 (two) times daily. 01/19/16  Yes Christina P Rama, MD  QUEtiapine (SEROQUEL) 25 MG tablet Take 1 tablet (25 mg total) by mouth at bedtime. 01/19/16  Yes Christina P Rama, MD  sertraline (ZOLOFT) 100 MG tablet Take 100 mg by mouth at bedtime.    Yes Historical Provider, MD  simvastatin (ZOCOR) 10 MG tablet Take 10 mg by mouth at bedtime.    Yes Historical Provider, MD   BP 137/75 mmHg  Pulse 122  Temp(Src) 98.5 F (36.9 C) (Axillary)  Resp 16  SpO2 97% Physical Exam  Constitutional: No distress.  Elderly, frail  HENT:  Head: Normocephalic and atraumatic.  Right Ear: External ear normal.  Left Ear: External  ear normal.  Eyes: Conjunctivae are normal. Right eye exhibits no discharge. Left eye exhibits no discharge. No scleral icterus.  Neck: Neck supple. No tracheal deviation present.  Cardiovascular: Normal rate, regular rhythm and intact distal pulses.   Murmur heard.  Systolic murmur is present  Pulmonary/Chest: Effort normal and breath sounds normal. No stridor. No respiratory distress. He has no wheezes. He has no rales.  Abdominal: Soft. Bowel sounds are normal. He exhibits no distension. There is no tenderness. There is no rebound and no guarding.  Musculoskeletal: He exhibits edema (edema of the bilateral lower extremities, patient has compression dressings from his distal aspect of his foot up to below the knee, the dressing was removed, no ulcerations or evidence of cellulitis). He exhibits no tenderness.  Patient has mild erythema in the sacral area, no breakdown of the skin  Neurological: He is alert. He has normal strength. No cranial nerve deficit (no facial droop, extraocular movements intact, no slurred speech) or sensory deficit. He exhibits normal muscle tone. He displays no seizure activity. Coordination normal.  Skin: Skin is warm and dry. No rash noted.  Psychiatric: He has a normal mood and affect.  Nursing note and vitals reviewed.   ED Course  Procedures (including critical care time) Labs Review Labs Reviewed  CBC - Abnormal; Notable for the following:    WBC 12.0 (*)    RBC 4.01 (*)    Hemoglobin 9.9 (*)    HCT 32.6 (*)    MCH 24.7 (*)    RDW 16.2 (*)    All other components within normal limits  COMPREHENSIVE METABOLIC PANEL - Abnormal; Notable for the following:    Potassium 3.4 (*)    Chloride 98 (*)    Glucose, Bld 119 (*)    BUN 31 (*)    Creatinine, Ser 1.78 (*)    Calcium 8.1 (*)    Albumin 2.7 (*)    ALT 11 (*)  GFR calc non Af Amer 33 (*)    GFR calc Af Amer 38 (*)    All other components within normal limits  PROTIME-INR - Abnormal; Notable  for the following:    Prothrombin Time 15.7 (*)    All other components within normal limits  APTT  I-STAT TROPOININ, ED    Imaging Review Dg Chest Portable 1 View  02/06/2016  CLINICAL DATA:  Acute on chronic left-sided chest pain, former smoker EXAM: PORTABLE CHEST 1 VIEW COMPARISON:  01/15/2016 FINDINGS: Moderate cardiac enlargement similar to prior study infiltrate left lower lobe less severe when compared to the prior examination. Infiltrate right lower lobe similar to prior study. Vascular pattern normal. Bilateral shoulder arthropathy. IMPRESSION: Findings consistent with persistent but improved left lower lobe pneumonia. Also suspect right lower lobe pneumonia. Hyper attenuation to the right of midline new the medial aspect of the right diaphragm likely represents residual barium within distal esophagus given some rotation of the patient. Patient had a barium study performed 02/04/2016. Electronically Signed   By: Skipper Cliche M.D.   On: 02/06/2016 19:56   I have personally reviewed and evaluated these images and lab results as part of my medical decision-making.   EKG Interpretation   Date/Time:  Saturday February 06 2016 18:59:28 EST Ventricular Rate:  106 PR Interval:    QRS Duration: 149 QT Interval:  402 QTC Calculation: 534 R Axis:   7 Text Interpretation:  probable sinus tachycardia with PAC Ventricular  premature complex Right bundle branch block LVH with secondary  repolarization abnormality Baseline wander in lead(s) II III aVL aVF V6  similar appearance to prior ECG Confirmed by Maynor Mwangi  MD-J, Xitlalic Maslin [id number]) on  02/06/2016 7:16:27 PM      MDM   Final diagnoses:  Chest pain, unspecified chest pain type  HCAP (healthcare-associated pneumonia)    Patient's initial laboratory tests do not suggest acute cardiac ischemia. he is not actively having chest pain at this time.  Chest x-ray suggests a persistent left lower lobe pneumonia as well as a new right lower lobe  pneumonia.  Will start on IV abx.  Consult with medical service for observation, serial enzymes, continue abx.     Dorie Rank, MD 02/06/16 2139

## 2016-02-07 DIAGNOSIS — R0789 Other chest pain: Secondary | ICD-10-CM

## 2016-02-07 DIAGNOSIS — J189 Pneumonia, unspecified organism: Secondary | ICD-10-CM | POA: Diagnosis not present

## 2016-02-07 LAB — CBC WITH DIFFERENTIAL/PLATELET
BASOS ABS: 0 10*3/uL (ref 0.0–0.1)
BASOS PCT: 0 %
Eosinophils Absolute: 0.4 10*3/uL (ref 0.0–0.7)
Eosinophils Relative: 4 %
HEMATOCRIT: 30.9 % — AB (ref 39.0–52.0)
Hemoglobin: 9.8 g/dL — ABNORMAL LOW (ref 13.0–17.0)
LYMPHS PCT: 17 %
Lymphs Abs: 1.8 10*3/uL (ref 0.7–4.0)
MCH: 26.1 pg (ref 26.0–34.0)
MCHC: 31.7 g/dL (ref 30.0–36.0)
MCV: 82.2 fL (ref 78.0–100.0)
Monocytes Absolute: 0.9 10*3/uL (ref 0.1–1.0)
Monocytes Relative: 8 %
NEUTROS ABS: 7.4 10*3/uL (ref 1.7–7.7)
NEUTROS PCT: 71 %
Platelets: 180 10*3/uL (ref 150–400)
RBC: 3.76 MIL/uL — AB (ref 4.22–5.81)
RDW: 16.4 % — ABNORMAL HIGH (ref 11.5–15.5)
WBC: 10.6 10*3/uL — AB (ref 4.0–10.5)

## 2016-02-07 LAB — MRSA PCR SCREENING: MRSA by PCR: NEGATIVE

## 2016-02-07 LAB — COMPREHENSIVE METABOLIC PANEL
ALBUMIN: 2.5 g/dL — AB (ref 3.5–5.0)
ALT: 10 U/L — ABNORMAL LOW (ref 17–63)
ANION GAP: 14 (ref 5–15)
AST: 20 U/L (ref 15–41)
Alkaline Phosphatase: 69 U/L (ref 38–126)
BUN: 29 mg/dL — ABNORMAL HIGH (ref 6–20)
CO2: 32 mmol/L (ref 22–32)
Calcium: 7.8 mg/dL — ABNORMAL LOW (ref 8.9–10.3)
Chloride: 100 mmol/L — ABNORMAL LOW (ref 101–111)
Creatinine, Ser: 1.78 mg/dL — ABNORMAL HIGH (ref 0.61–1.24)
GFR, EST AFRICAN AMERICAN: 38 mL/min — AB (ref >60)
GFR, EST NON AFRICAN AMERICAN: 33 mL/min — AB (ref >60)
Glucose, Bld: 168 mg/dL — ABNORMAL HIGH (ref 65–99)
POTASSIUM: 3.5 mmol/L (ref 3.5–5.1)
Sodium: 146 mmol/L — ABNORMAL HIGH (ref 135–145)
TOTAL PROTEIN: 6.1 g/dL — AB (ref 6.5–8.1)
Total Bilirubin: 0.6 mg/dL (ref 0.3–1.2)

## 2016-02-07 LAB — TROPONIN I
TROPONIN I: 0.06 ng/mL — AB (ref <0.031)
Troponin I: 0.05 ng/mL — ABNORMAL HIGH (ref <0.031)
Troponin I: 0.05 ng/mL — ABNORMAL HIGH (ref <0.031)
Troponin I: 0.05 ng/mL — ABNORMAL HIGH (ref <0.031)

## 2016-02-07 LAB — GLUCOSE, CAPILLARY: GLUCOSE-CAPILLARY: 97 mg/dL (ref 65–99)

## 2016-02-07 MED ORDER — MELATONIN 3 MG PO TABS
3.0000 mg | ORAL_TABLET | Freq: Every day | ORAL | Status: DC
  Administered 2016-02-07 – 2016-02-09 (×3): 3 mg via ORAL
  Filled 2016-02-07 (×5): qty 1

## 2016-02-07 MED ORDER — VANCOMYCIN HCL 10 G IV SOLR
1250.0000 mg | INTRAVENOUS | Status: DC
  Administered 2016-02-07 – 2016-02-09 (×2): 1250 mg via INTRAVENOUS
  Filled 2016-02-07 (×3): qty 1250

## 2016-02-07 MED ORDER — DILTIAZEM HCL ER COATED BEADS 180 MG PO CP24
360.0000 mg | ORAL_CAPSULE | Freq: Every day | ORAL | Status: DC
  Administered 2016-02-07 – 2016-02-10 (×4): 360 mg via ORAL
  Filled 2016-02-07 (×4): qty 2

## 2016-02-07 NOTE — Progress Notes (Signed)
NURSING PROGRESS NOTE  Daxx Martineau [name] [id number] Admission Data: 02/07/2016 3:29 AM Attending Provider: No att. providers found [id number], MD Code Status: Full  Allergies:  Review of patient's allergies indicates no known allergies. Past Medical History:   has a past medical history of Hyperlipidemia; COPD (chronic obstructive pulmonary disease) ([place]); Leg pain; GERD (gastroesophageal reflux disease); Depression; Dizziness; Productive cough; Thyroid disease; Peripheral vascular disease [facility]); ED (erectile dysfunction); Hypertension; Walking pneumonia (2000's); H/O hiatal hernia; Migraine; Arthritis; Anxiety; Hypothyroidism; and Chronic kidney disease (CKD), stage III (moderate). Past Surgical History:   has past surgical history that includes Renal artery stent (Left); Kidney stone surgery (1989); Lower extremity angiogram (Left, 03-20-2014); Tonsillectomy; Cataract extraction, bilateral (Bilateral); Blepharoplasty (Right); abdominal aortagram (N/A, 03/20/2013); and lower extremity angiogram (Left, 03/20/2013). Social History:   reports that he quit smoking about 3 years ago. His smoking use included Cigarettes. He has a 25 pack-year smoking history. He has never used smokeless tobacco. He reports that he drinks alcohol. He reports that he does not use illicit drugs.  Jacquan Viars [name] is a 82 y.o. male patient admitted from ED:   Last Documented Vital Signs: Blood pressure 117/66, pulse 87, temperature 97.5 F (36.4 C), temperature source Oral, resp. rate 16, height 6' 1.2" (1.859 m), weight 83.28 kg (183 lb 9.6 oz), SpO2 94 %.  Cardiac Monitoring:   IV Fluids:  IV in place, occlusive dsg intact without redness, IV cath hand left, condition patent and no redness normal saline.   Skin: moisture associated skin damage on lower abdomin, groin, and buttocks. Skin tear on buttocks. UNNA boots in place   Patient orientated to room. Information packet given to patient. Admission inpatient  armband information verified with patient to include name and date of birth and placed on patient arm. Side rails up x 2, fall assessment and education completed with patient. Patient able to verbalize understanding of risk associated with falls and verbalized understanding to call for assistance before getting out of bed. Call light within reach.  Will continue to evaluate and treat per MD orders.  Clydell Hakim RN, BSN

## 2016-02-07 NOTE — NC FL2 (Signed)
Onward LEVEL OF CARE SCREENING TOOL     IDENTIFICATION  Patient Name: [name] Birthdate: [birth year] Sex: male Admission Date (Current Location): 02/06/2016  [facility] and [place] Number:  Herbalist and Address:  [facility] [place], [place] [address] [place], [place] [address]      Provider Number: [id number]  Attending Physician Name and Address:  Donne Hazel, MD  Relative Name and Phone Number:       Current Level of Care: Hospital Recommended Level of Care: [place] Prior Approval Number:    Date Approved/Denied:   PASRR Number: [id number]  Discharge Plan: SNF    Current Diagnoses: Patient Active Problem List   Diagnosis Date Noted  . Pneumonia 02/06/2016  . Wounds, multiple open, lower extremity 01/23/2016  . Nonsustained ventricular tachycardia ([place]) 01/19/2016  . MRSA pneumonia ([place]) 01/19/2016  . Diabetic neuropathy ([place]) 01/18/2016  . Chronic venous insufficiency 01/18/2016  . Acute kidney injury ([place]) 01/17/2016  . Hypomagnesemia 01/16/2016  . Altered mental status 01/16/2016  . Stage III chronic kidney disease 01/16/2016  . HCAP (healthcare-associated pneumonia) 01/04/2016  . Hypotension 11/23/2015  . Candida rash of groin 09/23/2015  . Bipolar disorder ([place]) 07/14/2015  . Aortic stenosis 07/09/2015  . Chronic diastolic CHF (congestive heart failure) ([place]) 07/09/2015  . Sepsis due to pneumonia ([place]) 07/08/2015  . Acute respiratory failure with hypoxia ([place]) 07/02/2015  . Pulmonary edema 07/02/2015  . Leukocytosis 07/02/2015  . Type 2 diabetes mellitus with stage 3 chronic kidney disease ([place]) 07/02/2015  . Chronic osteomyelitis of right foot ([place]) 11/11/2014  . Beta-hemolytic group B streptococcal sepsis ([place]) 11/11/2014  . Bacteremia 11/11/2014  . Atherosclerotic PVD with ulceration ([place]) 10/28/2014  . Hyperlipidemia 10/07/2014  . Obesity 10/07/2014  . Moderate aortic stenosis  10/07/2014  . Multifocal atrial tachycardia ([place]) 10/06/2014  . Sepsis ([place]) 10/05/2014  . Wound of right leg 08/28/2014  . Foot ulcer ([place]) 01/04/2013  . Cellulitis 01/04/2013  . PAD (peripheral artery disease) ([place]) 01/04/2013  . Bilateral lower extremity edema 10/23/2012  . Atherosclerosis of native arteries of the extremities with ulceration ([place]) 10/23/2012  . Aspiration pneumonia ([place]) 08/17/2012  . Toxic encephalopathy 08/17/2012  . COPD with acute exacerbation ([place]) 08/17/2012  . Sacral decubitus ulcer 08/17/2012  . Chronic pain 08/17/2012  . Anxiety disorder 08/17/2012  . Chronic kidney disease, stage III (moderate) 08/17/2012  . Hypothyroidism 02/21/2008  . DYSLIPIDEMIA 02/21/2008  . ANXIETY DEPRESSION 02/21/2008  . RHEUMATIC FEVER 02/21/2008  . Hypertensive heart disease with CHF ([place]) 02/21/2008  . COPD 02/21/2008  . ESOPHAGEAL MOTILITY DISORDER 02/21/2008  . ESOPHAGEAL DIVERTICULUM 02/21/2008  . SLEEP APNEA 02/21/2008  . ATHEROSCLEROSIS 07/11/2007  . INGUINAL HERNIAS, BILATERAL 07/11/2007  . HERNIA, UMBILICAL [id number]  . CHOLELITHIASIS 07/11/2007  . UNSPECIFIED RENAL SCLEROSIS 07/11/2007  . TUBULOVILLOUS ADENOMA, COLON 11/02/2005  . GERD 11/02/2005  . DIVERTICULOSIS OF COLON 11/02/2005  . MELANOSIS COLI 11/02/2005    Orientation RESPIRATION BLADDER Height & Weight     Self, Place, Time  Normal Continent Weight: 183 lb 9.6 oz (83.28 kg) Height:  6' 1.2" (185.9 cm)  BEHAVIORAL SYMPTOMS/MOOD NEUROLOGICAL BOWEL NUTRITION STATUS      Continent Diet  AMBULATORY STATUS COMMUNICATION OF NEEDS Skin   Limited Assist Verbally Surgical wounds                       Personal Care Assistance Level of Assistance  Bathing, Feeding, Dressing  Bathing Assistance: Limited assistance Feeding assistance: Independent Dressing Assistance: Limited assistance     Functional Limitations Info  Sight, Hearing, Speech Sight Info: Adequate Hearing Info: Impaired Speech  Info: Adequate    SPECIAL CARE FACTORS FREQUENCY  PT (By licensed PT), OT (By licensed OT)                    Contractures      Additional Factors Info  Code Status Code Status Info: FULL Allergies Info: No Known Allergies           Current Medications (02/07/2016):  This is the current hospital active medication list Current Facility-Administered Medications  Medication Dose Route Frequency Provider Last Rate Last Dose  . 0.9 %  sodium chloride infusion   Intravenous Continuous Gennaro Africa, MD 75 mL/hr at 02/07/16 0205    . acetaminophen (TYLENOL) tablet 650 mg  650 mg Oral Q6H PRN Gennaro Africa, MD      . aspirin EC tablet 81 mg  81 mg Oral Daily Gennaro Africa, MD   81 mg at 02/07/16 1030  . bisacodyl (DULCOLAX) suppository 10 mg  10 mg Rectal Daily PRN Gennaro Africa, MD      . carbidopa-levodopa (SINEMET IR) 10-100 MG per tablet immediate release 1 tablet  1 tablet Oral QHS Gennaro Africa, MD   1 tablet at 02/07/16 0050  . diltiazem (CARDIZEM CD) 24 hr capsule 360 mg  360 mg Oral Daily Gennaro Africa, MD   360 mg at 02/07/16 0859  . enoxaparin (LOVENOX) injection 30 mg  30 mg Subcutaneous Q24H Gennaro Africa, MD   30 mg at 02/07/16 0858  . feeding supplement (ENSURE ENLIVE) (ENSURE ENLIVE) liquid 237 mL  1 Bottle Oral TID BM Gennaro Africa, MD   237 mL at 02/07/16 1412  . fentaNYL (DURAGESIC - dosed mcg/hr) patch 50 mcg  50 mcg Transdermal Q3 days Gennaro Africa, MD   50 mcg at 02/07/16 1030  . fluconazole (DIFLUCAN) tablet 100 mg  100 mg Oral Daily Gennaro Africa, MD   100 mg at 02/07/16 0856  . furosemide (LASIX) tablet 40 mg  40 mg Oral BID Gennaro Africa, MD   40 mg at 02/07/16 0856  . gabapentin (NEURONTIN) capsule 300 mg  300 mg Oral TID Gennaro Africa, MD   300 mg at 02/07/16 1501  . ipratropium-albuterol (DUONEB) 0.5-2.5 (3) MG/3ML nebulizer solution 3 mL  3 mL Nebulization Q6H PRN Gennaro Africa, MD      . levothyroxine (SYNTHROID, LEVOTHROID) tablet 112 mcg  112 mcg Oral QAC breakfast Gennaro Africa, MD   112 mcg at 02/07/16 0856  . linagliptin (TRADJENTA) tablet 5 mg  5 mg Oral Daily Gennaro Africa, MD   5 mg at 02/07/16 0855  . LORazepam (ATIVAN) tablet 0.5 mg  0.5 mg Oral TID Gennaro Africa, MD   0.5 mg at 02/07/16 1501  . Melatonin TABS 3 mg  3 mg Oral QHS Gennaro Africa, MD      . mometasone-formoterol [facility]) 100-5 MCG/ACT inhaler 2 puff  2 puff Inhalation BID Gennaro Africa, MD   2 puff at 02/07/16 0009  . multivitamin with minerals tablet 1 tablet  1 tablet Oral Daily Gennaro Africa, MD   1 tablet at 02/07/16 0855  . oxyCODONE (Oxy IR/ROXICODONE) immediate release tablet 5 mg  5 mg Oral Q6H PRN Gennaro Africa, MD   5 mg at 02/07/16 1502  . pantoprazole (PROTONIX) EC tablet 40 mg  40 mg Oral Daily [facility],  MD   40 mg at 02/07/16 0857  . piperacillin-tazobactam (ZOSYN) IVPB 3.375 g  3.375 g Intravenous 3 times per day Jens Som, RPH   3.375 g at 02/07/16 1411  . potassium chloride SA (K-DUR,KLOR-CON) CR tablet 20 mEq  20 mEq Oral BID Gennaro Africa, MD   20 mEq at 02/07/16 0857  . QUEtiapine (SEROQUEL) tablet 25 mg  25 mg Oral QHS Gennaro Africa, MD   25 mg at 02/07/16 0035  . simvastatin (ZOCOR) tablet 10 mg  10 mg Oral QHS Gennaro Africa, MD   10 mg at 02/07/16 0050  . vancomycin (VANCOCIN) 1,250 mg in sodium chloride 0.9 % 250 mL IVPB  1,250 mg Intravenous Q24H Jens Som, [facility]         Discharge Medications: Please see discharge summary for a list of discharge medications.  Relevant Imaging Results:  Relevant Lab Results:   Additional Information SS#: [id number]  Raymondo Band, LCSW

## 2016-02-07 NOTE — Progress Notes (Signed)
Received report from ED RN, Suzanna. 

## 2016-02-07 NOTE — Progress Notes (Signed)
[facility] PROGRESS NOTE  [name] [id number] DOB: [birth year] DOA: 02/06/2016 PCP: [name], MD  HPI/Brief narrative Please see admit h and p from 2/18 for details. Briefly, 82 yo male with history of COPD, hypothyroidism, HTN, CKD, DM, CAD, dementia who presents from nursing home with complaints of chest pain. No N/V/D/C/abd pain/dysuria. Otherwise no complaints. Patient was found to have CXR findings suggestive of PNA.  Assessment/Plan: HCAP (healthcare-associated pneumonia) Noted on admission CXR Started on vanc/zosyn.  Patient was recently treated for HCAP.   Chest Pain:  Likely due to above Currently stable Trop thus far stable at 0.05. Continue to trend EKG is non ischemic Keep on tele Continue statin and asp 2d echo was ordered, still pending  COPD  Continue albuterol/ipratropium as needed Not in exacerbation  Continue inhaled steroids/LABA    Hypothyroidism Continue levothyroxine.    GERD Continue Protonix 40 mg daily.   Wounds:  Continue local wound care.   DM2  Continue home meds.   Code Status: full Family Communication: Pt in room Disposition Plan: anticipate d/c when off IV meds   Consultants:    Procedures:    Antibiotics: Anti-infectives    Start     Dose/Rate Route Frequency Ordered Stop   02/07/16 2300  vancomycin (VANCOCIN) 1,250 mg in sodium chloride 0.9 % 250 mL IVPB     1,250 mg 166.7 mL/hr over 90 Minutes Intravenous Every 24 hours 02/07/16 1407     02/07/16 1000  fluconazole (DIFLUCAN) tablet 100 mg     100 mg Oral Daily 02/06/16 2222     02/07/16 0600  piperacillin-tazobactam (ZOSYN) IVPB 3.375 g     3.375 g 12.5 mL/hr over 240 Minutes Intravenous 3 times per day 02/06/16 2235     02/06/16 2245  piperacillin-tazobactam (ZOSYN) IVPB 3.375 g     3.375 g 100 mL/hr over 30 Minutes Intravenous  Once 02/06/16 2231 02/07/16 0137   02/06/16 2230  ceFEPIme (MAXIPIME) 1 g in dextrose 5 % 50 mL  IVPB  Status:  Discontinued     1 g 100 mL/hr over 30 Minutes Intravenous  Once 02/06/16 2147 02/06/16 2236   02/06/16 2230  vancomycin (VANCOCIN) 2,000 mg in sodium chloride 0.9 % 500 mL IVPB     2,000 mg 250 mL/hr over 120 Minutes Intravenous  Once 02/06/16 2225 02/07/16 0101      HPI/Subjective: States feeling better currently  Objective: Filed Vitals:   02/07/16 0253 02/07/16 0540 02/07/16 1338 02/07/16 1600  BP: 117/66 143/70 135/60 132/70  Pulse: 87 69 91 96  Temp: 97.5 F (36.4 C) 98.4 F (36.9 C) 97.6 F (36.4 C) 98 F (36.7 C)  TempSrc: Oral Oral  Oral  Resp: 16 16 19 19   Height: 6' 1.2" (1.859 m)     Weight: 83.28 kg (183 lb 9.6 oz)     SpO2: 94% 94% 100% 94%    Intake/Output Summary (Last 24 hours) at 02/07/16 1702 Last data filed at 02/07/16 1636  Gross per 24 hour  Intake 793.75 ml  Output   1100 ml  Net -306.25 ml   Filed Weights   02/07/16 0253  Weight: 83.28 kg (183 lb 9.6 oz)    Exam:   General:  Awake, in nad  Cardiovascular: regular, s1, s2  Respiratory: normal resp effort, no wheezing  Abdomen: soft,nondistended  Musculoskeletal: perfused, no clubbing   Data Reviewed: Basic Metabolic Panel:  Recent Labs Lab 02/06/16 1952 02/07/16 0025  NA 144 146*  K 3.4*  3.5  CL 98* 100*  CO2 31 32  GLUCOSE 119* 168*  BUN 31* 29*  CREATININE 1.78* 1.78*  CALCIUM 8.1* 7.8*   Liver Function Tests:  Recent Labs Lab 02/06/16 1952 02/07/16 0025  AST 17 20  ALT 11* 10*  ALKPHOS 79 69  BILITOT 0.8 0.6  PROT 6.6 6.1*  ALBUMIN 2.7* 2.5*   No results for input(s): LIPASE, AMYLASE in the last 168 hours. No results for input(s): AMMONIA in the last 168 hours. CBC:  Recent Labs Lab 02/06/16 1952 02/07/16 0025  WBC 12.0* 10.6*  NEUTROABS  --  7.4  HGB 9.9* 9.8*  HCT 32.6* 30.9*  MCV 81.3 82.2  PLT 194 180   Cardiac Enzymes:  Recent Labs Lab 02/07/16 0025 02/07/16 0847 02/07/16 1402  TROPONINI 0.05* 0.05* 0.06*   BNP  (last 3 results)  Recent Labs  07/02/15 0250 07/08/15 1838 07/09/15 0425  BNP 181.6* 148.9* 215.9*    ProBNP (last 3 results) No results for input(s): PROBNP in the last 8760 hours.  CBG: No results for input(s): GLUCAP in the last 168 hours.  Recent Results (from the past 240 hour(s))  MRSA PCR Screening     Status: None   Collection Time: 02/07/16  2:45 AM  Result Value Ref Range Status   MRSA by PCR NEGATIVE NEGATIVE Final    Comment:        The GeneXpert MRSA Assay (FDA approved for NASAL specimens only), is one component of a comprehensive MRSA colonization surveillance program. It is not intended to diagnose MRSA infection nor to guide or monitor treatment for MRSA infections.      Studies: Dg Chest Portable 1 View  02/06/2016  CLINICAL DATA:  Acute on chronic left-sided chest pain, former smoker EXAM: PORTABLE CHEST 1 VIEW COMPARISON:  01/15/2016 FINDINGS: Moderate cardiac enlargement similar to prior study infiltrate left lower lobe less severe when compared to the prior examination. Infiltrate right lower lobe similar to prior study. Vascular pattern normal. Bilateral shoulder arthropathy. IMPRESSION: Findings consistent with persistent but improved left lower lobe pneumonia. Also suspect right lower lobe pneumonia. Hyper attenuation to the right of midline new the medial aspect of the right diaphragm likely represents residual barium within distal esophagus given some rotation of the patient. Patient had a barium study performed 02/04/2016. Electronically Signed   By: Skipper Cliche M.D.   On: 02/06/2016 19:56    Scheduled Meds: . aspirin EC  81 mg Oral Daily  . carbidopa-levodopa  1 tablet Oral QHS  . diltiazem  360 mg Oral Daily  . enoxaparin (LOVENOX) injection  30 mg Subcutaneous Q24H  . feeding supplement (ENSURE ENLIVE)  1 Bottle Oral TID BM  . fentaNYL  50 mcg Transdermal Q3 days  . fluconazole  100 mg Oral Daily  . furosemide  40 mg Oral BID  .  gabapentin  300 mg Oral TID  . levothyroxine  112 mcg Oral QAC breakfast  . linagliptin  5 mg Oral Daily  . LORazepam  0.5 mg Oral TID  . Melatonin  3 mg Oral QHS  . mometasone-formoterol  2 puff Inhalation BID  . multivitamin with minerals  1 tablet Oral Daily  . pantoprazole  40 mg Oral Daily  . piperacillin-tazobactam (ZOSYN)  IV  3.375 g Intravenous 3 times per day  . potassium chloride SA  20 mEq Oral BID  . QUEtiapine  25 mg Oral QHS  . simvastatin  10 mg Oral QHS  . vancomycin  1,250  mg Intravenous Q24H   Continuous Infusions: . sodium chloride 75 mL/hr at 02/07/16 0205    Active Problems:   HCAP (healthcare-associated pneumonia)   Pneumonia    Izabella Marcantel K  [facility] Pager [phone]. If 7PM-7AM, please contact night-coverage at [email or web address], password [facility] 02/07/2016, 5:02 PM

## 2016-02-08 ENCOUNTER — Encounter (HOSPITAL_COMMUNITY): Payer: Self-pay | Admitting: Physician Assistant

## 2016-02-08 ENCOUNTER — Observation Stay (HOSPITAL_BASED_OUTPATIENT_CLINIC_OR_DEPARTMENT_OTHER): Payer: Medicare Other

## 2016-02-08 DIAGNOSIS — R079 Chest pain, unspecified: Secondary | ICD-10-CM

## 2016-02-08 DIAGNOSIS — I471 Supraventricular tachycardia: Secondary | ICD-10-CM | POA: Diagnosis not present

## 2016-02-08 DIAGNOSIS — G20 Parkinson's disease: Secondary | ICD-10-CM | POA: Diagnosis not present

## 2016-02-08 DIAGNOSIS — R7989 Other specified abnormal findings of blood chemistry: Secondary | ICD-10-CM

## 2016-02-08 DIAGNOSIS — I350 Nonrheumatic aortic (valve) stenosis: Secondary | ICD-10-CM

## 2016-02-08 DIAGNOSIS — I5032 Chronic diastolic (congestive) heart failure: Secondary | ICD-10-CM | POA: Diagnosis not present

## 2016-02-08 DIAGNOSIS — I472 Ventricular tachycardia: Secondary | ICD-10-CM | POA: Diagnosis not present

## 2016-02-08 DIAGNOSIS — J189 Pneumonia, unspecified organism: Secondary | ICD-10-CM | POA: Diagnosis not present

## 2016-02-08 DIAGNOSIS — E785 Hyperlipidemia, unspecified: Secondary | ICD-10-CM | POA: Diagnosis not present

## 2016-02-08 LAB — BASIC METABOLIC PANEL
ANION GAP: 15 (ref 5–15)
BUN: 19 mg/dL (ref 6–20)
CALCIUM: 8.3 mg/dL — AB (ref 8.9–10.3)
CO2: 31 mmol/L (ref 22–32)
Chloride: 98 mmol/L — ABNORMAL LOW (ref 101–111)
Creatinine, Ser: 1.56 mg/dL — ABNORMAL HIGH (ref 0.61–1.24)
GFR, EST AFRICAN AMERICAN: 45 mL/min — AB (ref >60)
GFR, EST NON AFRICAN AMERICAN: 39 mL/min — AB (ref >60)
Glucose, Bld: 106 mg/dL — ABNORMAL HIGH (ref 65–99)
Potassium: 3.9 mmol/L (ref 3.5–5.1)
SODIUM: 144 mmol/L (ref 135–145)

## 2016-02-08 LAB — MAGNESIUM: MAGNESIUM: 0.7 mg/dL — AB (ref 1.7–2.4)

## 2016-02-08 LAB — CBC
HCT: 33 % — ABNORMAL LOW (ref 39.0–52.0)
HEMOGLOBIN: 10.5 g/dL — AB (ref 13.0–17.0)
MCH: 26.4 pg (ref 26.0–34.0)
MCHC: 31.8 g/dL (ref 30.0–36.0)
MCV: 82.9 fL (ref 78.0–100.0)
Platelets: 168 10*3/uL (ref 150–400)
RBC: 3.98 MIL/uL — AB (ref 4.22–5.81)
RDW: 16.7 % — ABNORMAL HIGH (ref 11.5–15.5)
WBC: 10.4 10*3/uL (ref 4.0–10.5)

## 2016-02-08 LAB — TROPONIN I: TROPONIN I: 0.05 ng/mL — AB (ref <0.031)

## 2016-02-08 MED ORDER — MAGNESIUM SULFATE 4 GM/100ML IV SOLN
4.0000 g | Freq: Once | INTRAVENOUS | Status: AC
  Administered 2016-02-08: 4 g via INTRAVENOUS
  Filled 2016-02-08: qty 100

## 2016-02-08 MED ORDER — ENOXAPARIN SODIUM 40 MG/0.4ML ~~LOC~~ SOLN
40.0000 mg | SUBCUTANEOUS | Status: DC
  Administered 2016-02-09 – 2016-02-10 (×2): 40 mg via SUBCUTANEOUS
  Filled 2016-02-08 (×2): qty 0.4

## 2016-02-08 MED ORDER — OXYCODONE HCL 5 MG PO TABS
5.0000 mg | ORAL_TABLET | ORAL | Status: DC | PRN
  Administered 2016-02-08 – 2016-02-09 (×6): 5 mg via ORAL
  Filled 2016-02-08 (×6): qty 1

## 2016-02-08 NOTE — Care Management Obs Status (Signed)
[place] NOTIFICATION   Patient Details  Name: [name] MRN: [id number] Date of Birth: 05-Nov-1929   Medicare Observation Status Notification Given:  Yes    Carles Collet, RN 02/08/2016, 9:31 AM

## 2016-02-08 NOTE — Consult Note (Signed)
Cardiology Consultation Note    Patient ID: [name], MRN: [id number], DOB/AGE: 07-09-1929 82 y.o. Admit date: 02/06/2016   Date of Consult: 02/08/2016 Primary Physician: [name], MD Primary Cardiologist: Dr. Marlou Porch as inpatient; has not been seen in outpatient setting  Chief Complaint: chest pain Reason for Consultation: chest pain, abnormal troponin, abnormal echo (WMA, AS) Requesting MD: Dr. Wyline Copas  HPI: [name] is an 82 y/o male nursing home patient with history of PAD (noninvasive studies suggesting mod-severe arterial insufficiency confirmed by prior PV angiograms, followed by Dr. Donnetta Hutching - not a candidate for LE bypass), DM c/b neuropathy, CKD stage III, COPD, GERD, hypothyroidism, anxiety, moderate-severe aortic stenosis, bipolar disorder, anxiety, chronic venous insufficiency, NSVT (in setting of low Mg 12/2015), multifocal atrial tachycardia, L RAS s/p L renal artery stent, RBBB, chronic diastolic CHF whom we are asked to see for CP, elevated troponin, and abnormal echo.  Cardiology initially saw him in 2015 for MAT during admission for sepsis, treated with diltiazem. He was last seen by [place] in 06/2015 during admission for HCAP, HF, & 2 mechanical falls - he had elevated troponin at that time with flat trend and mod-severe AS with recommendation for outpatient TAVR referral, although not felt to be a great candidate. We have not seen him as an outpatient.  He was last admitted 1/27-1/31/17 for HCAP MRSA PNA. He apparently returned back to the hospital with complaints of chest pain. He is not a great historian. He carries a diagnosis of dementia per H/P. He is presently A+Ox3 but requires a lot of prompting and specific questions in order to obtain any useful history. It sounds like the afternoon of admission on 02/06/16, while sitting and "running his mouth" he developed a sensation of chest pain across his chest described as a "pulsing" sensation. It was not associated with any  SOB, nausea, vomiting, diaphoresis or palpitations. It was not worse with inspiration or palpation. It continued as an "undertone" and resolved spontaneously (by [employer], the fairy ???). Not sure what he was referring to). He denies any fever, chills, cough, bleeding or syncope. He has chronic LEE. He reports he has chest pain from time to time but is not really able to quantify or characterize this. 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 CXR c/w persistent yet improved LLL PNA and suspected RLL PNA thus he was started on vanc/zosyn again. Labs are notable for Cr 1.78->1.56, albumin 2.7, troponin 0.00-0.01-0.05-0.05-0.05-0.06. Hgb 9-10 (downtrend from 11-12 in summer 2016). 2D Echo 02/08/16: EF 60-65%, akinesis of basal inferior myocardium grade 1 DD, by calculated AVA the AS appears severe but by peak velocity and mean gradient it is moderate (c/w mod-severe sclerosis), + sclerosis, PASP 33mmHg. He denies any further chest pain - says "I'm 82 years old and I don't know how the hell I feel." EKG and tele c/w MAT, brief 4 beats NSVT.   Past Medical History  Diagnosis Date  . Hyperlipidemia   . COPD (chronic obstructive pulmonary disease) ([place])   . Leg pain   . GERD (gastroesophageal reflux disease)   . Depression   . Hypothyroidism   . Peripheral vascular disease ([place])     a. noninvasive studies suggesting mod-severe arterial insufficiency confirmed by prior PV angiograms, followed by Dr. Donnetta Hutching).  . ED (erectile dysfunction)   . Hypertension     08/17/12 - wife denies  . H/O hiatal hernia   . Migraine     "when  I was a young boy"  . Arthritis     "hands" (08/28/2014)  . Anxiety   . Chronic kidney disease (CKD), stage III (moderate)     [name] 08/28/2014  . Diabetes mellitus with neuropathy ([place])   . Bipolar disorder ([place])   . Aortic stenosis     a. Mod-severe by echo.  . Chronic venous insufficiency   . NSVT (nonsustained ventricular  tachycardia) ([place])     a. 12/2015 in setting of low Mg.  . Multifocal atrial tachycardia ([place])   . Renal artery stenosis ([facility])     a. s/p prior L renal artery stent.  Marland Kitchen RBBB   . Chronic diastolic CHF (congestive heart failure) [facility])       Surgical History:  Past Surgical History  Procedure Laterality Date  . Renal artery stent Left   . Kidney stone surgery  1989  . Lower extremity angiogram Left 03-20-2014    Dr. Tinnie Gens  . Tonsillectomy    . Cataract extraction, bilateral Bilateral   . Blepharoplasty Right   . Abdominal aortagram N/A 03/20/2013    Procedure: ABDOMINAL Maxcine Ham;  Surgeon: [name], MD;  Location: [facility] CATH LAB;  Service: Cardiovascular;  Laterality: N/A;  . Lower extremity angiogram Left 03/20/2013    Procedure: LOWER EXTREMITY ANGIOGRAM;  Surgeon: [name], MD;  Location: [facility] CATH LAB;  Service: Cardiovascular;  Laterality: Left;     Home Meds: Prior to Admission medications   Medication Sig Start Date End Date Taking? Authorizing Provider  acetaminophen (TYLENOL) 325 MG tablet Take 650 mg by mouth every 6 (six) hours as needed for mild pain, moderate pain, fever or headache.    Yes Historical Provider, MD  aspirin EC 81 MG tablet Take 81 mg by mouth daily.    Yes Historical Provider, MD  bisacodyl (DULCOLAX) 10 MG suppository Place 1 suppository (10 mg total) rectally once. Patient taking differently: Place 10 mg rectally daily as needed for moderate constipation.  01/19/16  Yes Christina P Rama, MD  carbidopa-levodopa (SINEMET) 10-100 MG per tablet Take 1 tablet by mouth at bedtime.    Yes Historical Provider, MD  diltiazem (CARDIZEM CD) 240 MG 24 hr capsule Take 240 mg by mouth daily. Take with a 120 mg capsule for a 360 mg dose   Yes Historical Provider, MD  diltiazem (DILACOR XR) 120 MG 24 hr capsule Take 120 mg by mouth daily. Take with a 240 mg capsule for a 360 mg dose   Yes Historical Provider, MD  doxycycline (VIBRAMYCIN) 100 MG capsule Take 100  mg by mouth 2 (two) times daily. Per MAR start date 01/27/16, stop date 02/16/16   Yes Historical Provider, MD  feeding supplement (ENSURE) PUDG Take 1 Container by mouth 3 (three) times daily between meals. Patient taking differently: Take 1 Container by mouth See admin instructions. Offer as afternoon and bedtime snack 08/23/12  Yes Shon Baton, MD  fentaNYL (DURAGESIC - DOSED MCG/HR) 50 MCG/HR Place 1 patch (50 mcg total) onto the skin every 3 (three) days. 12/22/15  Yes Estill Dooms, MD  fluconazole (DIFLUCAN) 100 MG tablet Take 100 mg by mouth daily. 14 day course started 02/03/16   Yes Historical Provider, MD  Fluticasone-Salmeterol (ADVAIR) 250-50 MCG/DOSE AEPB Inhale 1 puff into the lungs 2 (two) times daily. Rinse mouth after inhalation and spit out   Yes Historical Provider, MD  furosemide (LASIX) 40 MG tablet Take 1 tablet (40 mg total) by mouth 2 (two) times daily.  Patient taking differently: Take 40 mg by mouth 2 (two) times daily. 6am and 4:30pm 07/12/15  Yes Nita Sells, MD  gabapentin (NEURONTIN) 300 MG capsule Take 1 capsule (300 mg total) by mouth 2 (two) to 3 (three)  times daily. Patient taking differently: Take 300 mg by mouth 3 (three) times daily. 10am, 4pm, 10pm 09/02/14  Yes Shon Baton, MD  GLUCERNA [facility]) LIQD Take 237 mLs by mouth See admin instructions. Offer 1 can (237 mls) by mouth between breakfast and lunch   Yes Historical Provider, MD  ipratropium (ATROVENT) 0.02 % nebulizer solution Take 0.5 mg by nebulization every 6 (six) hours as needed for shortness of breath.    Yes Historical Provider, MD  ipratropium-albuterol (DUONEB) 0.5-2.5 (3) MG/3ML SOLN Take 3 mLs by nebulization every 6 (six) hours as needed (shortness of breathe.).   Yes Historical Provider, MD  levothyroxine (SYNTHROID, LEVOTHROID) 112 MCG tablet Take 112 mcg by mouth daily before breakfast.    Yes Historical Provider, MD  linagliptin (TRADJENTA) 5 MG TABS tablet Take 1 tablet (5 mg total) by mouth  daily. 10/09/14  Yes Shon Baton, MD  LORazepam (ATIVAN) 0.5 MG tablet Take 1 tablet (0.5 mg total) by mouth 3 (three) times daily. Patient taking differently: Take 0.5 mg by mouth 3 (three) times daily. 8am, 12pm, 8pm 01/19/16  Yes Venetia Maxon Rama, MD  Magnesium Hydroxide (MILK OF MAGNESIA PO) Take 30 mLs by mouth daily as needed (mild constipation).   Yes Historical Provider, MD  Melatonin-Pyridoxine (MELATIN PO) Take 1 tablet by mouth at bedtime.    Yes Historical Provider, MD  Multiple Vitamin (MULTIVITAMIN WITH MINERALS) TABS tablet Take 1 tablet by mouth daily.   Yes Historical Provider, MD  omeprazole (PRILOSEC) 20 MG capsule Take 20 mg by mouth daily before breakfast.    Yes Historical Provider, MD  oxycodone (OXY-IR) 5 MG capsule Take 1 capsule (5 mg total) by mouth every 6 (six) hours as needed for pain. 01/19/16  Yes Venetia Maxon Rama, MD  polyethylene glycol (MIRALAX / GLYCOLAX) packet Take 17 g by mouth daily. Patient taking differently: Take 17 g by mouth daily. Mix in 4 oz of water and drink 01/19/16  Yes Christina P Rama, MD  potassium chloride SA (K-DUR,KLOR-CON) 20 MEQ tablet Take 1 tablet (20 mEq total) by mouth 2 (two) times daily. 01/19/16  Yes Christina P Rama, MD  QUEtiapine (SEROQUEL) 25 MG tablet Take 1 tablet (25 mg total) by mouth at bedtime. 01/19/16  Yes Christina P Rama, MD  sertraline (ZOLOFT) 100 MG tablet Take 100 mg by mouth at bedtime.    Yes Historical Provider, MD  simvastatin (ZOCOR) 10 MG tablet Take 10 mg by mouth at bedtime.    Yes Historical Provider, MD    Inpatient Medications:  . aspirin EC  81 mg Oral Daily  . carbidopa-levodopa  1 tablet Oral QHS  . diltiazem  360 mg Oral Daily  . [START ON 02/09/2016] enoxaparin (LOVENOX) injection  40 mg Subcutaneous Q24H  . feeding supplement (ENSURE ENLIVE)  1 Bottle Oral TID BM  . fentaNYL  50 mcg Transdermal Q3 days  . fluconazole  100 mg Oral Daily  . furosemide  40 mg Oral BID  . gabapentin  300 mg Oral TID  .  levothyroxine  112 mcg Oral QAC breakfast  . linagliptin  5 mg Oral Daily  . LORazepam  0.5 mg Oral TID  . Melatonin  3 mg Oral QHS  . mometasone-formoterol  2 puff  Inhalation BID  . multivitamin with minerals  1 tablet Oral Daily  . pantoprazole  40 mg Oral Daily  . piperacillin-tazobactam (ZOSYN)  IV  3.375 g Intravenous 3 times per day  . potassium chloride SA  20 mEq Oral BID  . QUEtiapine  25 mg Oral QHS  . simvastatin  10 mg Oral QHS  . vancomycin  1,250 mg Intravenous Q24H   . sodium chloride 75 mL/hr at 02/07/16 0205    Allergies: No Known Allergies  Social History   Social History  . Marital Status: Married    Spouse Name: N/A  . Number of Children: N/A  . Years of Education: N/A   Occupational History  . Not on file.   Social History Main Topics  . Smoking status: Former Smoker -- 1.00 packs/day for 25 years    Types: Cigarettes    Quit date: 08/17/2012  . Smokeless tobacco: Never Used  . Alcohol Use: 0.0 oz/week    0 Standard drinks or equivalent per week     Comment: 08/28/2014 "last drink was in ~ 2012"  . Drug Use: No  . Sexual Activity: Not on file   Other Topics Concern  . Not on file   Social History Narrative     Family History  Problem Relation Age of Onset  . Other Mother     AAA  . AAA (abdominal aortic aneurysm) Mother   . Heart attack Father   . Alcohol abuse Father   . Diabetes Father   . Other Father     amputation  . Diabetes Brother   . Coronary artery disease Brother   . Dementia Brother      Review of Systems: All other systems reviewed and are otherwise negative except as noted above.  Labs:  Recent Labs  02/07/16 0847 02/07/16 1402 02/07/16 1957 02/08/16 0958  TROPONINI 0.05* 0.06* 0.05* 0.05*   Lab Results  Component Value Date   WBC 10.4 02/08/2016   HGB 10.5* 02/08/2016   HCT 33.0* 02/08/2016   MCV 82.9 02/08/2016   PLT 168 02/08/2016    Recent Labs Lab 02/07/16 0025 02/08/16 0658  NA 146* 144  K  3.5 3.9  CL 100* 98*  CO2 32 31  BUN 29* 19  CREATININE 1.78* 1.56*  CALCIUM 7.8* 8.3*  PROT 6.1*  --   BILITOT 0.6  --   ALKPHOS 69  --   ALT 10*  --   AST 20  --   GLUCOSE 168* 106*   No results found for: CHOL, HDL, LDLCALC, TRIG No results found for: DDIMER  Radiology/Studies:  Dg Chest 2 View  01/15/2016  CLINICAL DATA:  Altered mental status. Recent treatment for pneumonia. Productive cough. History of COPD and hypertension. EXAM: CHEST  2 VIEW COMPARISON:  07/10/2015 FINDINGS: Cardiac enlargement with mild pulmonary vascular congestion. Interstitial changes in the lung bases likely representing mild edema. There is superimposed airspace disease in the left lung base with small left pleural effusion likely representing superimposed pneumonia. No pneumothorax. Calcified and tortuous aorta. Degenerative changes in the spine and shoulders. IMPRESSION: Cardiac enlargement with pulmonary vascular congestion and mild interstitial edema. Superimposed airspace consolidation in the left lung base with small left pleural effusion likely representing pneumonia. Electronically Signed   By: Lucienne Capers M.D.   On: 01/15/2016 21:14   Dg Op Swallowing Func-medicare/speech Path  02/04/2016  Objective Swallowing Evaluation: Type of Study: MBS-Modified Barium Swallow Study Patient Details Name: [name] MRN: [id number]  Date of Birth: 04/09/1929 Today's Date: 02/04/2016 Time: SLP Start Time (ACUTE ONLY): 1206-SLP Stop Time (ACUTE ONLY): 1234 SLP Time Calculation (min) (ACUTE ONLY): 28 min Past Medical History: Past Medical History Diagnosis Date . Hyperlipidemia  . COPD (chronic obstructive pulmonary disease) ([place])  . Leg pain  . GERD (gastroesophageal reflux disease)  . Depression  . Dizziness  . Productive cough  . Thyroid disease  . Peripheral vascular disease ([place])  . ED (erectile dysfunction)  . Hypertension    08/17/12 - wife denies . Walking pneumonia 2000's . H/O hiatal hernia  . Migraine     "when I was a young boy" . Arthritis    "hands" (08/28/2014) . Anxiety  . Hypothyroidism  . Chronic kidney disease (CKD), stage III (moderate)    [name] 08/28/2014 Past Surgical History: Past Surgical History Procedure Laterality Date . Renal artery stent Left  . Kidney stone surgery  1989 . Lower extremity angiogram Left 03-20-2014   Dr. Tinnie Gens . Tonsillectomy   . Cataract extraction, bilateral Bilateral  . Blepharoplasty Right  . Abdominal aortagram N/A 03/20/2013   Procedure: ABDOMINAL Maxcine Ham;  Surgeon: [name], MD;  Location: [facility] CATH LAB;  Service: Cardiovascular;  Laterality: N/A; . Lower extremity angiogram Left 03/20/2013   Procedure: LOWER EXTREMITY ANGIOGRAM;  Surgeon: [name], MD;  Location: [facility] CATH LAB;  Service: Cardiovascular;  Laterality: Left; HPI: 82 yo male presents for OP swallow study. PMH significant for COPD, aspiration PNA, esophageal dysmotility, GERD, and records from SNF idnicate Parkinson's disease. BSE during inpatient admission 1/28 recommended regular diet and thin liquids. Subjective: pt without complaints, denies difficulty swallowing Assessment / Plan / Recommendation CHL IP CLINICAL IMPRESSIONS 02/04/2016 Therapy Diagnosis Mild pharyngeal phase dysphagia Clinical Impression Pt has a mild pharyngeal dysphagia that is sensorimotor and structurally based. Mildly impaired timing, coupled with incomplete airway closure due to presence of suspected osteophytes impeding full epiglottic inversion results in silent penetration with all liquids and even purees. Thin liquids reach the level of the vocal cords, and require muliple cued coughs to clear. Thicker viscosities lead to more shallow penetration, making it easier for him to expel them from his laryngeal vestibule. Recommend Dys 3 diet and nectar thick liquids by cup with consistent throat clear post-swallow. Pt verbalized wanting to "go back to doing things the way they were". Primary SLP and MD may wish to discuss other  options with pt as well, including water protocol and/or accepting the risks of aspiration. Impact on safety and function Moderate aspiration risk   CHL IP TREATMENT RECOMMENDATION 02/04/2016 Treatment Recommendations Defer treatment plan to f/u with SLP   Prognosis 02/04/2016 Prognosis for Safe Diet Advancement Fair Barriers to Reach Goals -- Barriers/Prognosis Comment -- CHL IP DIET RECOMMENDATION 02/04/2016 SLP Diet Recommendations Dysphagia 3 (Mech soft) solids;Nectar thick liquid Liquid Administration via Cup;No straw Medication Administration Crushed with puree Compensations Slow rate;Small sips/bites;Clear throat after each swallow Postural Changes Remain semi-upright after after feeds/meals (Comment);Seated upright at 90 degrees   CHL IP OTHER RECOMMENDATIONS 02/04/2016 Recommended Consults -- Oral Care Recommendations Oral care BID Other Recommendations --   CHL IP FOLLOW UP RECOMMENDATIONS 02/04/2016 Follow up Recommendations Skilled Nursing facility   [facility] IP FREQUENCY AND DURATION 01/16/2016 Speech Therapy Frequency (ACUTE ONLY) min 1 x/week Treatment Duration 1 week      CHL IP ORAL PHASE 02/04/2016 Oral Phase WFL Oral - Pudding Teaspoon -- Oral - Pudding Cup -- Oral - Honey Teaspoon -- Oral - Honey Cup --  Oral - Nectar Teaspoon -- Oral - Nectar Cup -- Oral - Nectar Straw -- Oral - Thin Teaspoon -- Oral - Thin Cup -- Oral - Thin Straw -- Oral - Puree -- Oral - Mech Soft -- Oral - Regular -- Oral - Multi-Consistency -- Oral - Pill -- Oral Phase - Comment --  CHL IP PHARYNGEAL PHASE 02/04/2016 Pharyngeal Phase Impaired Pharyngeal- Pudding Teaspoon -- Pharyngeal -- Pharyngeal- Pudding Cup -- Pharyngeal -- Pharyngeal- Honey Teaspoon -- Pharyngeal -- Pharyngeal- Honey Cup -- Pharyngeal -- Pharyngeal- Nectar Teaspoon -- Pharyngeal -- Pharyngeal- Nectar Cup Delayed swallow initiation-vallecula;Reduced airway/laryngeal closure;Penetration/Aspiration during swallow Pharyngeal Material enters airway, remains ABOVE vocal  cords and not ejected out Pharyngeal- Nectar Straw -- Pharyngeal -- Pharyngeal- Thin Teaspoon -- Pharyngeal -- Pharyngeal- Thin Cup Delayed swallow initiation-vallecula;Reduced airway/laryngeal closure;Penetration/Aspiration during swallow Pharyngeal Material enters airway, CONTACTS cords and not ejected out Pharyngeal- Thin Straw -- Pharyngeal -- Pharyngeal- Puree Delayed swallow initiation-vallecula;Reduced airway/laryngeal closure;Penetration/Aspiration during swallow Pharyngeal Material enters airway, remains ABOVE vocal cords and not ejected out Pharyngeal- Mechanical Soft Delayed swallow initiation-vallecula;Reduced airway/laryngeal closure;Pharyngeal residue - posterior pharnyx Pharyngeal -- Pharyngeal- Regular -- Pharyngeal -- Pharyngeal- Multi-consistency -- Pharyngeal -- Pharyngeal- Pill -- Pharyngeal -- Pharyngeal Comment --  CHL IP CERVICAL ESOPHAGEAL PHASE 02/04/2016 Cervical Esophageal Phase WFL Pudding Teaspoon -- Pudding Cup -- Honey Teaspoon -- Honey Cup -- Nectar Teaspoon -- Nectar Cup -- Nectar Straw -- Thin Teaspoon -- Thin Cup -- Thin Straw -- Puree -- Mechanical Soft -- Regular -- Multi-consistency -- Pill -- Cervical Esophageal Comment -- CHL IP GO 02/04/2016 Functional Assessment Tool Used skilled clinical judgment Functional Limitations Swallowing Swallow Current Status [id number]) CJ Swallow Goal Status [id number]) CJ Swallow Discharge Status [id number]) CJ Motor Speech Current Status [id number]) (None) Motor Speech Goal Status [id number]) (None) Motor Speech Goal Status [id number]) (None) Spoken Language Comprehension Current Status [id number]) (None) Spoken Language Comprehension Goal Status [id number]) (None) Spoken Language Comprehension Discharge Status [id number]) (None) Spoken Language Expression Current Status [id number]) (None) Spoken Language Expression Goal Status [id number]) (None) Spoken Language Expression Discharge Status [id number]) (None) Attention Current Status [id number]) (None) Attention Goal Status [id number]) (None)  Attention Discharge Status [id number]) (None) Memory Current Status [id number]) (None) Memory Goal Status [id number]) (None) Memory Discharge Status [id number]) (None) Voice Current Status [id number]) (None) Voice Goal Status [id number]) (None) Voice Discharge Status [id number]) (None) Other Speech-Language Pathology Functional Limitation [id number]) (None) Other Speech-Language Pathology Functional Limitation Goal Status [id number]) (None) Other Speech-Language Pathology Functional Limitation Discharge Status [phone]) (None) [name], M.A. CCC-SLP [phone] [name] 02/04/2016, 4:20 PM      CLINICAL DATA:  Dysphagia.  COPD.  Parkinson's disease. EXAM: MODIFIED BARIUM SWALLOW TECHNIQUE: Different consistencies of barium were administered orally to the patient by the Speech Pathologist. Imaging of the pharynx was performed in the lateral projection. FLUOROSCOPY TIME:  Fluoroscopy Time:  1 minutes and 31 seconds Number of Acquired Images:  None COMPARISON:  Chest radiograph of 01/15/2016 FINDINGS: Thin liquid- demonstrates minimal penetration, including with chin tuck. Retention. Nectar thick liquid- minimal penetration, without aspiration. Pure- within normal limits Pure with cracker- no aspiration or penetration. Mild residual coating. IMPRESSION: Mild swallow dysfunction, as above. Please refer to the Speech Pathologists report for complete details and recommendations. Electronically Signed   By: Abigail Miyamoto M.D.   On: 02/04/2016 15:22   Dg Chest Portable 1 View  02/06/2016  CLINICAL DATA:  Acute on chronic left-sided chest pain, former smoker EXAM: PORTABLE CHEST 1 VIEW COMPARISON:  01/15/2016 FINDINGS: Moderate cardiac enlargement similar to  prior study infiltrate left lower lobe less severe when compared to the prior examination. Infiltrate right lower lobe similar to prior study. Vascular pattern normal. Bilateral shoulder arthropathy. IMPRESSION: Findings consistent with persistent but improved left lower lobe pneumonia.  Also suspect right lower lobe pneumonia. Hyper attenuation to the right of midline new the medial aspect of the right diaphragm likely represents residual barium within distal esophagus given some rotation of the patient. Patient had a barium study performed 02/04/2016. Electronically Signed   By: Skipper Cliche M.D.   On: 02/06/2016 19:56    Wt Readings from Last 3 Encounters:  02/07/16 183 lb 9.6 oz (83.28 kg)  01/16/16 205 lb 11 oz (93.3 kg)  10/13/15 158 lb 11.7 oz (72 kg)   EKG: multifocal atrial tachycardia 91bpm, RBBB, nonspecific ST-T changes  Physical Exam: Blood pressure 106/58, pulse 118, temperature 98 F (36.7 C), temperature source Oral, resp. rate 16, height 6' 1.2" (1.859 m), weight 183 lb 9.6 oz (83.28 kg), SpO2 98 %. Body mass index is 24.1 kg/(m^2). General: Elderly, frail appearing AAM in no acute distress. Head: Normocephalic, atraumatic, sclera non-icteric, no xanthomas, nares are without discharge.  Neck: Negative for carotid bruits. JVD not elevated. Lungs: Diminished throughout without wheezes, rales, or rhonchi. Breathing is unlabored. Heart: Irregularly irregular, mildly elevated rate, 2/6 SEM, difficult to hear S2. No rubs or gallops appreciated. Abdomen: Soft, non-tender, non-distended with normoactive bowel sounds. No hepatomegaly. No rebound/guarding. No obvious abdominal masses. Msk:  Strength and tone appear normal for age. Extremities: No clubbing or cyanosis. Trace BLE edema, legs wrapped.  Neuro: Alert and oriented X 3 but speaks fast, mumbles at times. No facial asymmetry. No focal deficit. Moves all extremities spontaneously. Psych:  Responds to questions appropriately with a normal affect.   Assessment and Plan   1. Recurrent HCAP - per IM.  2. Chest pain with marginally elevated troponin - symptoms were short-lived per history. Troponin elevation is nonspecific in setting of PNA and renal dysfunction. He has multiple risk factors for CAD but also  multiple comorbidities which make him less ideal candidate to pursue invasive workup including CKD, new anemia, current PNA, and possible dementia. Would treat PNA as you are doing and follow for recurrent symptoms. Would continue ASA and statin. Suspect he is not on BB due to COPD and borderline BPs. We can reassess candidacy for further workup as outpatient.   3. Mod-severe aortic stenosis - per prior notes, would be marginal candidate for TAVR only if he demonstrated significant clinical improvement. Agree with this assessment. Unfortunately this has not been the case recently. We can have him follow up in the outpatient setting for further monitoring.  4. Multifocal atrial tachycardia - suspected due to #1. Continue diltiazem.   5. 4 beats NSVT - LV function normal on echo. Check Mg - wrote order for nurse to contact provider if 1.8 or less. Continue to follow.  6. Chronic diastolic CHF - appears euvolemic on exam. He refused his Lasix this AM. Caution with IV fluids - consider capping as soon as clinically indicated. Would continue to follow volume.  Signed, Bruce Pitter PA-C 02/08/2016, 2:38 PM Pager: [phone]  I have personally seen and examined this patient with Bruce Copa, PA-C. I agree with the assessment and plan as outlined above. I have personally examined this patient. I have reviewed all aspects of the history with the patient. He is a 82 yo male, resident of a nursing facility with at least mild dementia  who is admitted after episode of chest pain. He denies any chest pain currently but does admit to occasional episodes. Troponin with subtle elevation at 0.06. EKG with MAT, no ischemic changes. I have personally reviewed this EKG. He currently has no complaints. He is known to have MAT chronicaly and is on Cardizem. He is known to have moderately severe AS but has not been felt to be a candidate for TAVR during prior evaluations. At this time he is being treated with broad spectrum  antibiotics for possible pneumonia. During my examination, his dementia is evident. He appears to be comfortable. I would not recommend further ischemic testing. I do not think he is a good candidate for AVR or TAVR given his age, poor functional capacity and dementia.   He can have follow up in our [facility] office with Dr. Marlou Porch.   We will sign off. Please call if there are questions.   [name] 02/08/2016 3:09 PM

## 2016-02-08 NOTE — Progress Notes (Signed)
[facility] PROGRESS NOTE  [name] Bruce [id number] DOB: 11-09-1929 DOA: 02/06/2016 PCP: [name], MD  HPI/Brief narrative Please see admit h and p from 2/18 for details. Briefly, 82 yo male with history of COPD, hypothyroidism, HTN, CKD, DM, CAD, dementia who presents from nursing home with complaints of chest pain. No N/V/D/C/abd pain/dysuria. Otherwise no complaints. Patient was found to have CXR findings suggestive of PNA.  Assessment/Plan: HCAP (healthcare-associated pneumonia) Noted on admission CXR Patient is continued on vanc/zosyn.  Patient was recently treated for HCAP.   Chest Pain:  Likely due to above Currently stable Trop had remained stable at 0.05 EKG is non ischemic Keep on tele Continue statin and asp 2d echo was ordered, findings of akinesis of the basalinferior myocardium Consulted Cardiology - recs for no further ischemic work up  COPD  Continue albuterol/ipratropium as needed Not in exacerbation  Continue inhaled steroids/LABA    Hypothyroidism Continue levothyroxine.   GERD Continue Protonix 40 mg daily as tolerated   Wounds:  Continue local wound care.   DM2  Continue home meds.   Code Status: full Family Communication: Pt in room Disposition Plan: anticipate d/c when off IV meds  Consultants:    Procedures:    Antibiotics: Anti-infectives    Start     Dose/Rate Route Frequency Ordered Stop   02/07/16 2300  vancomycin (VANCOCIN) 1,250 mg in sodium chloride 0.9 % 250 mL IVPB     1,250 mg 166.7 mL/hr over 90 Minutes Intravenous Every 24 hours 02/07/16 1407 02/15/16 2359   02/07/16 1000  fluconazole (DIFLUCAN) tablet 100 mg     100 mg Oral Daily 02/06/16 2222     02/07/16 0600  piperacillin-tazobactam (ZOSYN) IVPB 3.375 g     3.375 g 12.5 mL/hr over 240 Minutes Intravenous 3 times per day 02/06/16 2235     02/06/16 2245  piperacillin-tazobactam (ZOSYN) IVPB 3.375 g     3.375 g 100 mL/hr over 30  Minutes Intravenous  Once 02/06/16 2231 02/07/16 0137   02/06/16 2230  ceFEPIme (MAXIPIME) 1 g in dextrose 5 % 50 mL IVPB  Status:  Discontinued     1 g 100 mL/hr over 30 Minutes Intravenous  Once 02/06/16 2147 02/06/16 2236   02/06/16 2230  vancomycin (VANCOCIN) 2,000 mg in sodium chloride 0.9 % 500 mL IVPB     2,000 mg 250 mL/hr over 120 Minutes Intravenous  Once 02/06/16 2225 02/07/16 0101      HPI/Subjective: Reports feeling somewhat better  Objective: Filed Vitals:   02/08/16 0821 02/08/16 1103 02/08/16 1445 02/08/16 1700  BP:  106/58 115/59   Pulse:  118 75   Temp:   97.7 F (36.5 C)   TempSrc:   Oral   Resp:   20   Height:      Weight:    83.26 kg (183 lb 8.9 oz)  SpO2: 99% 98% 98%     Intake/Output Summary (Last 24 hours) at 02/08/16 [id number] Last data filed at 02/08/16 1823  Gross per 24 hour  Intake 2586.25 ml  Output   1925 ml  Net 661.25 ml   Filed Weights   02/07/16 0253 02/08/16 1700  Weight: 83.28 kg (183 lb 9.6 oz) 83.26 kg (183 lb 8.9 oz)    Exam:   General:  Awake, in nad, laying in bed  Cardiovascular: regular, s1, s2  Respiratory: normal resp effort, no wheezing  Abdomen: soft,nondistended, pos BS  Musculoskeletal: perfused, no clubbing, no cyanosis  Data Reviewed: Basic Metabolic  Panel:  Recent Labs Lab 02/06/16 1952 02/07/16 0025 02/08/16 0658  NA 144 146* 144  K 3.4* 3.5 3.9  CL 98* 100* 98*  CO2 31 32 31  GLUCOSE 119* 168* 106*  BUN 31* 29* 19  CREATININE 1.78* 1.78* 1.56*  CALCIUM 8.1* 7.8* 8.3*   Liver Function Tests:  Recent Labs Lab 02/06/16 1952 02/07/16 0025  AST 17 20  ALT 11* 10*  ALKPHOS 79 69  BILITOT 0.8 0.6  PROT 6.6 6.1*  ALBUMIN 2.7* 2.5*   No results for input(s): LIPASE, AMYLASE in the last 168 hours. No results for input(s): AMMONIA in the last 168 hours. CBC:  Recent Labs Lab 02/06/16 1952 02/07/16 0025 02/08/16 0658  WBC 12.0* 10.6* 10.4  NEUTROABS  --  7.4  --   HGB 9.9* 9.8* 10.5*   HCT 32.6* 30.9* 33.0*  MCV 81.3 82.2 82.9  PLT 194 180 168   Cardiac Enzymes:  Recent Labs Lab 02/07/16 0025 02/07/16 0847 02/07/16 1402 02/07/16 1957 02/08/16 0958  TROPONINI 0.05* 0.05* 0.06* 0.05* 0.05*   BNP (last 3 results)  Recent Labs  07/02/15 0250 07/08/15 1838 07/09/15 0425  BNP 181.6* 148.9* 215.9*    ProBNP (last 3 results) No results for input(s): PROBNP in the last 8760 hours.  CBG:  Recent Labs Lab 02/07/16 1714  GLUCAP 97    Recent Results (from the past 240 hour(s))  MRSA PCR Screening     Status: None   Collection Time: 02/07/16  2:45 AM  Result Value Ref Range Status   MRSA by PCR NEGATIVE NEGATIVE Final    Comment:        The GeneXpert MRSA Assay (FDA approved for NASAL specimens only), is one component of a comprehensive MRSA colonization surveillance program. It is not intended to diagnose MRSA infection nor to guide or monitor treatment for MRSA infections.      Studies: Dg Chest Portable 1 View  02/06/2016  CLINICAL DATA:  Acute on chronic left-sided chest pain, former smoker EXAM: PORTABLE CHEST 1 VIEW COMPARISON:  01/15/2016 FINDINGS: Moderate cardiac enlargement similar to prior study infiltrate left lower lobe less severe when compared to the prior examination. Infiltrate right lower lobe similar to prior study. Vascular pattern normal. Bilateral shoulder arthropathy. IMPRESSION: Findings consistent with persistent but improved left lower lobe pneumonia. Also suspect right lower lobe pneumonia. Hyper attenuation to the right of midline new the medial aspect of the right diaphragm likely represents residual barium within distal esophagus given some rotation of the patient. Patient had a barium study performed 02/04/2016. Electronically Signed   By: Skipper Cliche M.D.   On: 02/06/2016 19:56    Scheduled Meds: . aspirin EC  81 mg Oral Daily  . carbidopa-levodopa  1 tablet Oral QHS  . diltiazem  360 mg Oral Daily  . [START ON  02/09/2016] enoxaparin (LOVENOX) injection  40 mg Subcutaneous Q24H  . feeding supplement (ENSURE ENLIVE)  1 Bottle Oral TID BM  . fentaNYL  50 mcg Transdermal Q3 days  . fluconazole  100 mg Oral Daily  . furosemide  40 mg Oral BID  . gabapentin  300 mg Oral TID  . levothyroxine  112 mcg Oral QAC breakfast  . linagliptin  5 mg Oral Daily  . LORazepam  0.5 mg Oral TID  . Melatonin  3 mg Oral QHS  . mometasone-formoterol  2 puff Inhalation BID  . multivitamin with minerals  1 tablet Oral Daily  . pantoprazole  40 mg Oral  Daily  . piperacillin-tazobactam (ZOSYN)  IV  3.375 g Intravenous 3 times per day  . potassium chloride SA  20 mEq Oral BID  . QUEtiapine  25 mg Oral QHS  . simvastatin  10 mg Oral QHS  . vancomycin  1,250 mg Intravenous Q24H   Continuous Infusions:    Active Problems:   HCAP (healthcare-associated pneumonia)   Pneumonia    [name] K  [facility] Pager [phone]. If 7PM-7AM, please contact night-coverage at [email or web address], password [facility] 02/08/2016, 6:48 PM

## 2016-02-08 NOTE — Care Management Note (Signed)
Case Management Note  Patient Details  Name: [name] MRN: [id number] Date of Birth: [birth year]  Subjective/Objective:                 Patient in observation from [facility] for HCAP.   Action/Plan:  Anticipate DC to SNF when medically clear, facilitated through CSW.  Expected Discharge Date:                  Expected Discharge Plan:  Skilled Nursing Facility  In-House Referral:  Clinical Social Work  Discharge planning Services  CM Consult  Post Acute Care Choice:    Choice offered to:     DME Arranged:    DME Agency:     HH Arranged:    [place] Agency:     Status of Service:  Completed, signed off  Medicare Important Message Given:    Date Medicare IM Given:    Medicare IM give by:    Date Additional Medicare IM Given:    Additional Medicare Important Message give by:     If discussed at [place] of Stay Meetings, dates discussed:    Additional Comments:  Carles Collet, RN 02/08/2016, 11:28 AM

## 2016-02-08 NOTE — Progress Notes (Signed)
Echocardiogram 2D Echocardiogram has been performed.  Tresa Res 02/08/2016, 11:23 AM

## 2016-02-08 NOTE — Clinical Social Work Note (Signed)
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  Yes Need for family participation in patient care:  Yes (Comment)  Care giving concerns:  None- pt is LTC resident at [facility] assessment / plan: CSW spoke with pt wife concerning disposition for patient when stable to leave the hospital.  Pt wife, [name], states that pt has been at [facility] for 6 months and that the plan would be for him to return there when stable.  Employment status:  Retired Nurse, adult, Medicaid In [place] PT Recommendations:  Not assessed at this time [place] / Referral to community resources:  [place]  Patient/Family's Response to care:  Agreeable to plan for pt to return to SNF when ready  Patient/Family's Understanding of and Emotional Response to Diagnosis, Current Treatment, and Prognosis:  No questions or concerns at this time.  Emotional Assessment Appearance:  Appears stated age Attitude/Demeanor/Rapport:  Unable to Assess Affect (typically observed):  Unable to Assess Orientation:  Oriented to Self, Oriented to Place, Oriented to  Time Alcohol /  Substance use:  Not Applicable Psych involvement (Current and /or in the community):  No (Comment)  Discharge Needs  Concerns to be addressed:  No discharge needs identified Readmission within the last 30 days:  No Current discharge risk:  None Barriers to Discharge:  Continued Medical Work up   [employer], LCSW 02/08/2016, 3:52 PM

## 2016-02-08 NOTE — Progress Notes (Signed)
Patient had a run of vtach.  Md notified.  Vital signs have been taken.  Patient is aysymptomatic

## 2016-02-08 NOTE — Progress Notes (Signed)
CRITICAL VALUE ALERT  Critical value received:  Mag 0.7  Date of notification:  02/08/16  Time of notification:  1946  Critical value read back:Yes.    Nurse who received alert:  Arthor Captain, LPN  MD notified (1st page):  Paged night coverage  Time of first page:  1958  MD notified (2nd page):  Time of second page:  Responding MD:  [facility]   Time MD responded:  2020

## 2016-02-09 DIAGNOSIS — J189 Pneumonia, unspecified organism: Secondary | ICD-10-CM | POA: Diagnosis not present

## 2016-02-09 LAB — MAGNESIUM: MAGNESIUM: 1.5 mg/dL — AB (ref 1.7–2.4)

## 2016-02-09 LAB — BASIC METABOLIC PANEL
ANION GAP: 13 (ref 5–15)
BUN: 17 mg/dL (ref 6–20)
CALCIUM: 8.1 mg/dL — AB (ref 8.9–10.3)
CO2: 30 mmol/L (ref 22–32)
Chloride: 98 mmol/L — ABNORMAL LOW (ref 101–111)
Creatinine, Ser: 1.58 mg/dL — ABNORMAL HIGH (ref 0.61–1.24)
GFR calc Af Amer: 44 mL/min — ABNORMAL LOW (ref >60)
GFR, EST NON AFRICAN AMERICAN: 38 mL/min — AB (ref >60)
GLUCOSE: 112 mg/dL — AB (ref 65–99)
Potassium: 3.3 mmol/L — ABNORMAL LOW (ref 3.5–5.1)
Sodium: 141 mmol/L (ref 135–145)

## 2016-02-09 LAB — CBC
HCT: 30.4 % — ABNORMAL LOW (ref 39.0–52.0)
HEMOGLOBIN: 9.4 g/dL — AB (ref 13.0–17.0)
MCH: 25.2 pg — AB (ref 26.0–34.0)
MCHC: 30.9 g/dL (ref 30.0–36.0)
MCV: 81.5 fL (ref 78.0–100.0)
Platelets: 153 10*3/uL (ref 150–400)
RBC: 3.73 MIL/uL — ABNORMAL LOW (ref 4.22–5.81)
RDW: 16.7 % — AB (ref 11.5–15.5)
WBC: 12.4 10*3/uL — ABNORMAL HIGH (ref 4.0–10.5)

## 2016-02-09 MED ORDER — HYDROCERIN EX CREA
TOPICAL_CREAM | Freq: Every day | CUTANEOUS | Status: DC
  Administered 2016-02-09 – 2016-02-10 (×2): via TOPICAL
  Filled 2016-02-09: qty 113

## 2016-02-09 MED ORDER — SULFAMETHOXAZOLE-TRIMETHOPRIM 800-160 MG PO TABS
1.0000 | ORAL_TABLET | Freq: Two times a day (BID) | ORAL | Status: DC
  Administered 2016-02-09 – 2016-02-10 (×2): 1 via ORAL
  Filled 2016-02-09 (×2): qty 1

## 2016-02-09 MED ORDER — AMOXICILLIN-POT CLAVULANATE 500-125 MG PO TABS
1.0000 | ORAL_TABLET | Freq: Two times a day (BID) | ORAL | Status: DC
  Administered 2016-02-09 – 2016-02-10 (×2): 500 mg via ORAL
  Filled 2016-02-09 (×3): qty 1

## 2016-02-09 MED ORDER — COLLAGENASE 250 UNIT/GM EX OINT
TOPICAL_OINTMENT | Freq: Every day | CUTANEOUS | Status: DC
  Administered 2016-02-09 – 2016-02-10 (×2): via TOPICAL
  Filled 2016-02-09: qty 30

## 2016-02-09 NOTE — Progress Notes (Signed)
I&O cath pt 650 mls pt tolerated well stated he felt relief. Arthor Captain LPN

## 2016-02-09 NOTE — Consult Note (Signed)
WOC wound consult note Reason for Consult: Consult requested for BLE.  Pt has been wearing compression wraps prior to admission which he states were changed once a week. Wound type: Left heel with unstageable pressure injury; 6X6cm; 100% dry eschar, no odor, drainage, or fluctuance. Left anterior foot with deep tissue injury; 1X4cm, dark purple reddish intact skin Right heel with stage 3 pressure injury; 3X4X.3cm, 80% red. 20% yellow slough, small amt yellow drainage, no odor Pressure Ulcer POA: Yes Dressing procedure/placement/frequency: It is best practice to leave dry stable eschar intact to heels.  Foam dressing applied to left heel and float bilat to reduce pressure.  Santyl ointment for chemical debridement of nonviable tissue to right heel. Please re-consult if further assistance is needed.  Thank-you,  Julien Girt MSN, [place], [place], [place], [place]

## 2016-02-09 NOTE — Progress Notes (Signed)
[facility] notified that pt states he feels his bladder is full bladder scan for greater than 200 mls. Arthor Captain LPN

## 2016-02-09 NOTE — Progress Notes (Signed)
ANTIBIOTIC CONSULT NOTE - INITIAL  Pharmacy Consult for Bactrim/Augmentin Indication: pneumonia  No Known Allergies  Patient Measurements: Height: 6' 1.2" (185.9 cm) Weight: 183 lb 8.9 oz (83.26 kg) IBW/kg (Calculated) : 80.36 Adjusted Body Weight:   Vital Signs: Temp: 98 F (36.7 C) (02/21 1444) Temp Source: Oral (02/21 1444) BP: 112/62 mmHg (02/21 1444) Pulse Rate: 81 (02/21 1444) Intake/Output from previous day: 02/20 0701 - 02/21 0700 In: 1407.5 [P.O.:440; I.V.:917.5; IV Piggyback:50] Out: 2000 [Urine:2000] Intake/Output from this shift:    Labs:  Recent Labs  02/07/16 0025 02/08/16 0658 02/09/16 0709  WBC 10.6* 10.4 12.4*  HGB 9.8* 10.5* 9.4*  PLT 180 168 153  CREATININE 1.78* 1.56* 1.58*   Estimated Creatinine Clearance: 38.9 mL/min (by C-G formula based on Cr of 1.58). No results for input(s): VANCOTROUGH, VANCOPEAK, VANCORANDOM, GENTTROUGH, GENTPEAK, GENTRANDOM, TOBRATROUGH, TOBRAPEAK, TOBRARND, AMIKACINPEAK, AMIKACINTROU, AMIKACIN in the last 72 hours.   Microbiology: Recent Results (from the past 720 hour(s))  Culture, blood (routine x 2) Call MD if unable to obtain prior to antibiotics being given     Status: None   Collection Time: 01/16/16  1:50 AM  Result Value Ref Range Status   Specimen Description BLOOD LEFT ANTECUBITAL  Final   Special Requests BOTTLES DRAWN AEROBIC AND ANAEROBIC 5CC  Final   Culture   Final    NO GROWTH 5 DAYS Performed at [facility]    Report Status 01/21/2016 FINAL  Final  Culture, blood (routine x 2) Call MD if unable to obtain prior to antibiotics being given     Status: None   Collection Time: 01/16/16  1:53 AM  Result Value Ref Range Status   Specimen Description BLOOD BLOOD LEFT FOREARM  Final   Special Requests BOTTLES DRAWN AEROBIC ONLY [place]  Final   Culture   Final    NO GROWTH 5 DAYS Performed at [facility]    Report Status 01/21/2016 FINAL  Final  Culture, sputum-assessment     Status: None    Collection Time: 01/16/16 10:11 PM  Result Value Ref Range Status   Specimen Description SPUTUM  Final   Special Requests NONE  Final   Sputum evaluation   Final    THIS SPECIMEN IS ACCEPTABLE. RESPIRATORY CULTURE REPORT TO FOLLOW.   Report Status 01/16/2016 FINAL  Final  Culture, respiratory (NON-Expectorated)     Status: None   Collection Time: 01/16/16 10:11 PM  Result Value Ref Range Status   Specimen Description SPUTUM  Final   Special Requests NONE  Final   Gram Stain   Final    ABUNDANT WBC PRESENT,BOTH PMN AND MONONUCLEAR FEW SQUAMOUS EPITHELIAL CELLS PRESENT FEW GRAM POSITIVE COCCI IN CLUSTERS FEW GRAM POSITIVE RODS FEW YEAST Performed at [facility]    Culture   Final    MODERATE METHICILLIN RESISTANT STAPHYLOCOCCUS AUREUS Note: RIFAMPIN AND GENTAMICIN SHOULD NOT BE USED AS SINGLE DRUGS FOR TREATMENT OF STAPH INFECTIONS. CRITICAL RESULT CALLED TO, READ BACK BY AND VERIFIED WITH: BOBBIE A 01/19/16 @0840  BY REAMM Performed at [facility]    Report Status 01/19/2016 FINAL  Final   Organism ID, Bacteria METHICILLIN RESISTANT STAPHYLOCOCCUS AUREUS  Final      Susceptibility   Methicillin resistant staphylococcus aureus - MIC*    CLINDAMYCIN >=8 RESISTANT Resistant     ERYTHROMYCIN >=8 RESISTANT Resistant     GENTAMICIN <=0.5 SENSITIVE Sensitive     LEVOFLOXACIN >=8 RESISTANT Resistant     OXACILLIN >=4 RESISTANT Resistant  RIFAMPIN <=0.5 SENSITIVE Sensitive     TRIMETH/SULFA <=10 SENSITIVE Sensitive     VANCOMYCIN 1 SENSITIVE Sensitive     TETRACYCLINE <=1 SENSITIVE Sensitive     * MODERATE METHICILLIN RESISTANT STAPHYLOCOCCUS AUREUS  MRSA PCR Screening     Status: None   Collection Time: 02/07/16  2:45 AM  Result Value Ref Range Status   MRSA by PCR NEGATIVE NEGATIVE Final    Comment:        The GeneXpert MRSA Assay (FDA approved for NASAL specimens only), is one component of a comprehensive MRSA colonization surveillance program. It is  not intended to diagnose MRSA infection nor to guide or monitor treatment for MRSA infections.     Medical History: Past Medical History  Diagnosis Date  . Hyperlipidemia   . COPD (chronic obstructive pulmonary disease) ([place])   . Leg pain   . GERD (gastroesophageal reflux disease)   . Depression   . Hypothyroidism   . Peripheral vascular disease ([place])     a. noninvasive studies suggesting mod-severe arterial insufficiency confirmed by prior PV angiograms, followed by Dr. Donnetta Hutching).  . ED (erectile dysfunction)   . Hypertension     08/17/12 - wife denies  . H/O hiatal hernia   . Migraine     "when I was a young boy"  . Arthritis     "hands" (08/28/2014)  . Anxiety   . Chronic kidney disease (CKD), stage III (moderate)     [name] 08/28/2014  . Diabetes mellitus with neuropathy ([place])   . Bipolar disorder ([place])   . Aortic stenosis     a. Mod-severe by echo.  . Chronic venous insufficiency   . NSVT (nonsustained ventricular tachycardia) ([place])     a. 12/2015 in setting of low Mg.  . Multifocal atrial tachycardia ([place])   . Renal artery stenosis ([facility])     a. s/p prior L renal artery stent.  Marland Kitchen RBBB   . Chronic diastolic CHF (congestive heart failure) ([facility])    Assessment:  ID: recurrent HCAP (has MRSA PNA from 12/2015, had Doxy from last discharge through 02/06/16), WBC 104>12.4 today, afeb. Discussed with Dr. Wyline Copas the options for d/c that might best cover MRSA + any gram negative.  2/19 Vanc>>2/21 2/19 Zosyn>> 2/21 Bactrim/Augmentin 2/21 (plan d/c)>> Fluconazole pta (14d, started 2/15)>>  2/19 MRSA PCR negative 1/28 sputum - mod MRSA (2/8>>2/28 per med hx)  Goal of Therapy:  Eradication of infection  Plan:  Augmentin 500mg  po q12h through 3/4 to complete a 14d course of abx Bactrim DS 1 tab q 12 hrs through 3/4 to complete a 14d course of abx   Brittainy Bucker S. Alford [place], PharmD, BCPS Clinical Staff Pharmacist Pager [phone]  Eilene Ghazi Stillinger 02/09/2016,7:20  PM

## 2016-02-09 NOTE — Progress Notes (Signed)
Pharmacy Antibiotic Note  [name] is a 82 y.o. male admitted on 02/06/2016 with pneumonia.  Pharmacy has been consulted for vancomycin and Zosyn dosing. Today is D#3 abx. Had MRSA PNA in 12/2015 and was completing a course of doxycycline PTA.  Plan: Vancomycin 1250 mg IV every 24 hours.  Goal trough 15-20 mcg/mL. Zosyn 3.375g IV q8h (4 hour infusion).  Check trough tomorrow at steady state  Height: 6' 1.2" (185.9 cm) Weight: 183 lb 8.9 oz (83.26 kg) IBW/kg (Calculated) : 80.36  Temp (24hrs), Avg:97.8 F (36.6 C), Min:97.7 F (36.5 C), Max:97.8 F (36.6 C)   Recent Labs Lab 02/06/16 1952 02/07/16 0025 02/08/16 0658  WBC 12.0* 10.6* 10.4  CREATININE 1.78* 1.78* 1.56*    Estimated Creatinine Clearance: 39.4 mL/min (by C-G formula based on Cr of 1.56).    No Known Allergies  Antimicrobials this admission: 2/19 Vanc>>  2/19 Zosyn>>  Fluconazole pta (14d, started 2/15)>>  Dose adjustments this admission: none  Microbiology results: 2/19 MRSA PCR negative  1/28 sputum - mod MRSA (2/8>>2/28 per med hx)   Thank you for allowing pharmacy to be a part of this patient's care.  [place], Pharm.D., BCPS Clinical Pharmacist Pager: [phone] 02/09/2016 8:15 AM

## 2016-02-09 NOTE — Progress Notes (Signed)
[facility] PROGRESS NOTE  Aireon Jalali [name] [id number] DOB: May 12, 1929 DOA: 02/06/2016 PCP: [name], MD  HPI/Brief narrative Please see admit h and p from 2/18 for details. Briefly, 82 yo male with history of COPD, hypothyroidism, HTN, CKD, DM, CAD, dementia who presents from nursing home with complaints of chest pain. No N/V/D/C/abd pain/dysuria. Otherwise no complaints. Patient was found to have CXR findings suggestive of PNA.  Assessment/Plan: HCAP (healthcare-associated pneumonia) Noted on admission CXR Patient has been continued on vanc/zosyn, remains afebrile without leukocytosis Patient was recently treated for MRSA HCAP. Discussed with pharmacy. Recs to start trial of bactrim with augmentin   Chest Pain:  Likely due to above Currently stable Trop had remained stable at 0.05 EKG is non ischemic Keep on tele Continue statin and asp 2d echo was ordered, findings of akinesis of the basalinferior myocardium Consulted Cardiology - recs for no further ischemic work up  COPD  Continue albuterol/ipratropium as needed Not in exacerbation  Continue inhaled steroids/LABA    Hypothyroidism Continue levothyroxine.   GERD Continue Protonix 40 mg daily as tolerated   Wounds:  Continue local wound care.   DM2  Continue home meds.   Code Status: full Family Communication: Pt in room Disposition Plan: anticipate d/c when off IV meds  Consultants:    Procedures:    Antibiotics: Anti-infectives    Start     Dose/Rate Route Frequency Ordered Stop   02/07/16 2300  vancomycin (VANCOCIN) 1,250 mg in sodium chloride 0.9 % 250 mL IVPB  Status:  Discontinued     1,250 mg 166.7 mL/hr over 90 Minutes Intravenous Every 24 hours 02/07/16 1407 02/09/16 1753   02/07/16 1000  fluconazole (DIFLUCAN) tablet 100 mg     100 mg Oral Daily 02/06/16 2222     02/07/16 0600  piperacillin-tazobactam (ZOSYN) IVPB 3.375 g  Status:  Discontinued     3.375 g 12.5  mL/hr over 240 Minutes Intravenous 3 times per day 02/06/16 2235 02/09/16 1753   02/06/16 2245  piperacillin-tazobactam (ZOSYN) IVPB 3.375 g     3.375 g 100 mL/hr over 30 Minutes Intravenous  Once 02/06/16 2231 02/07/16 0137   02/06/16 2230  ceFEPIme (MAXIPIME) 1 g in dextrose 5 % 50 mL IVPB  Status:  Discontinued     1 g 100 mL/hr over 30 Minutes Intravenous  Once 02/06/16 2147 02/06/16 2236   02/06/16 2230  vancomycin (VANCOCIN) 2,000 mg in sodium chloride 0.9 % 500 mL IVPB     2,000 mg 250 mL/hr over 120 Minutes Intravenous  Once 02/06/16 2225 02/07/16 0101      HPI/Subjective: No complaints. Mildly confused  Objective: Filed Vitals:   02/09/16 0832 02/09/16 0838 02/09/16 0849 02/09/16 1444  BP: 122/55 121/55  112/62  Pulse:  75  81  Temp:    98 F (36.7 C)  TempSrc:    Oral  Resp:    16  Height:      Weight:      SpO2:   97% 94%    Intake/Output Summary (Last 24 hours) at 02/09/16 1754 Last data filed at 02/09/16 1635  Gross per 24 hour  Intake 1647.5 ml  Output   2050 ml  Net -402.5 ml   Filed Weights   02/07/16 0253 02/08/16 1700  Weight: 83.28 kg (183 lb 9.6 oz) 83.26 kg (183 lb 8.9 oz)    Exam:   General:  Awake, in nad, laying in bed  Cardiovascular: regular, s1, s2  Respiratory: normal resp effort,  no wheezing  Abdomen: soft,nondistended, pos BS  Musculoskeletal: perfused, no cyanosis  Data Reviewed: Basic Metabolic Panel:  Recent Labs Lab 02/06/16 1952 02/07/16 0025 02/08/16 0658 02/08/16 1644 02/09/16 0709  NA 144 146* 144  --  141  K 3.4* 3.5 3.9  --  3.3*  CL 98* 100* 98*  --  98*  CO2 31 32 31  --  30  GLUCOSE 119* 168* 106*  --  112*  BUN 31* 29* 19  --  17  CREATININE 1.78* 1.78* 1.56*  --  1.58*  CALCIUM 8.1* 7.8* 8.3*  --  8.1*  MG  --   --   --  0.7* 1.5*   Liver Function Tests:  Recent Labs Lab 02/06/16 1952 02/07/16 0025  AST 17 20  ALT 11* 10*  ALKPHOS 79 69  BILITOT 0.8 0.6  PROT 6.6 6.1*  ALBUMIN 2.7* 2.5*    No results for input(s): LIPASE, AMYLASE in the last 168 hours. No results for input(s): AMMONIA in the last 168 hours. CBC:  Recent Labs Lab 02/06/16 1952 02/07/16 0025 02/08/16 0658 02/09/16 0709  WBC 12.0* 10.6* 10.4 12.4*  NEUTROABS  --  7.4  --   --   HGB 9.9* 9.8* 10.5* 9.4*  HCT 32.6* 30.9* 33.0* 30.4*  MCV 81.3 82.2 82.9 81.5  PLT 194 180 168 153   Cardiac Enzymes:  Recent Labs Lab 02/07/16 0025 02/07/16 0847 02/07/16 1402 02/07/16 1957 02/08/16 0958  TROPONINI 0.05* 0.05* 0.06* 0.05* 0.05*   BNP (last 3 results)  Recent Labs  07/02/15 0250 07/08/15 1838 07/09/15 0425  BNP 181.6* 148.9* 215.9*    ProBNP (last 3 results) No results for input(s): PROBNP in the last 8760 hours.  CBG:  Recent Labs Lab 02/07/16 1714  GLUCAP 97    Recent Results (from the past 240 hour(s))  MRSA PCR Screening     Status: None   Collection Time: 02/07/16  2:45 AM  Result Value Ref Range Status   MRSA by PCR NEGATIVE NEGATIVE Final    Comment:        The GeneXpert MRSA Assay (FDA approved for NASAL specimens only), is one component of a comprehensive MRSA colonization surveillance program. It is not intended to diagnose MRSA infection nor to guide or monitor treatment for MRSA infections.      Studies: No results found.  Scheduled Meds: . aspirin EC  81 mg Oral Daily  . carbidopa-levodopa  1 tablet Oral QHS  . collagenase   Topical Daily  . diltiazem  360 mg Oral Daily  . enoxaparin (LOVENOX) injection  40 mg Subcutaneous Q24H  . feeding supplement (ENSURE ENLIVE)  1 Bottle Oral TID BM  . fentaNYL  50 mcg Transdermal Q3 days  . fluconazole  100 mg Oral Daily  . furosemide  40 mg Oral BID  . gabapentin  300 mg Oral TID  . hydrocerin   Topical Daily  . levothyroxine  112 mcg Oral QAC breakfast  . linagliptin  5 mg Oral Daily  . LORazepam  0.5 mg Oral TID  . Melatonin  3 mg Oral QHS  . mometasone-formoterol  2 puff Inhalation BID  . multivitamin  with minerals  1 tablet Oral Daily  . pantoprazole  40 mg Oral Daily  . potassium chloride SA  20 mEq Oral BID  . QUEtiapine  25 mg Oral QHS  . simvastatin  10 mg Oral QHS   Continuous Infusions:    Active Problems:   HCAP (  healthcare-associated pneumonia)   Pneumonia    Eilam Shrewsbury K  [facility] Pager [phone]. If 7PM-7AM, please contact night-coverage at [email or web address], password [facility] 02/09/2016, 5:54 PM

## 2016-02-10 ENCOUNTER — Observation Stay (HOSPITAL_COMMUNITY): Payer: Medicare Other

## 2016-02-10 DIAGNOSIS — L8990 Pressure ulcer of unspecified site, unspecified stage: Secondary | ICD-10-CM | POA: Diagnosis not present

## 2016-02-10 DIAGNOSIS — R079 Chest pain, unspecified: Secondary | ICD-10-CM | POA: Insufficient documentation

## 2016-02-10 DIAGNOSIS — J189 Pneumonia, unspecified organism: Secondary | ICD-10-CM

## 2016-02-10 DIAGNOSIS — N183 Chronic kidney disease, stage 3 unspecified: Secondary | ICD-10-CM | POA: Insufficient documentation

## 2016-02-10 DIAGNOSIS — I10 Essential (primary) hypertension: Secondary | ICD-10-CM | POA: Insufficient documentation

## 2016-02-10 DIAGNOSIS — G20A1 Parkinson's disease without dyskinesia, without mention of fluctuations: Secondary | ICD-10-CM | POA: Insufficient documentation

## 2016-02-10 DIAGNOSIS — D638 Anemia in other chronic diseases classified elsewhere: Secondary | ICD-10-CM

## 2016-02-10 DIAGNOSIS — G20 Parkinson's disease: Secondary | ICD-10-CM | POA: Insufficient documentation

## 2016-02-10 LAB — BASIC METABOLIC PANEL
Anion gap: 13 (ref 5–15)
BUN: 20 mg/dL (ref 6–20)
CHLORIDE: 97 mmol/L — AB (ref 101–111)
CO2: 31 mmol/L (ref 22–32)
Calcium: 8.3 mg/dL — ABNORMAL LOW (ref 8.9–10.3)
Creatinine, Ser: 1.7 mg/dL — ABNORMAL HIGH (ref 0.61–1.24)
GFR calc Af Amer: 41 mL/min — ABNORMAL LOW (ref >60)
GFR calc non Af Amer: 35 mL/min — ABNORMAL LOW (ref >60)
GLUCOSE: 110 mg/dL — AB (ref 65–99)
POTASSIUM: 4 mmol/L (ref 3.5–5.1)
Sodium: 141 mmol/L (ref 135–145)

## 2016-02-10 LAB — CBC
HEMATOCRIT: 30 % — AB (ref 39.0–52.0)
Hemoglobin: 9.6 g/dL — ABNORMAL LOW (ref 13.0–17.0)
MCH: 26.4 pg (ref 26.0–34.0)
MCHC: 32 g/dL (ref 30.0–36.0)
MCV: 82.4 fL (ref 78.0–100.0)
Platelets: 143 10*3/uL — ABNORMAL LOW (ref 150–400)
RBC: 3.64 MIL/uL — ABNORMAL LOW (ref 4.22–5.81)
RDW: 16.7 % — AB (ref 11.5–15.5)
WBC: 12.2 10*3/uL — ABNORMAL HIGH (ref 4.0–10.5)

## 2016-02-10 MED ORDER — SULFAMETHOXAZOLE-TRIMETHOPRIM 800-160 MG PO TABS: 1.0000 | ORAL_TABLET | Freq: Two times a day (BID) | ORAL | Status: DC

## 2016-02-10 MED ORDER — AMOXICILLIN-POT CLAVULANATE 500-125 MG PO TABS: 1.0000 | ORAL_TABLET | Freq: Two times a day (BID) | ORAL | Status: AC

## 2016-02-10 MED ORDER — AMOXICILLIN-POT CLAVULANATE 500-125 MG PO TABS: 1.0000 | ORAL_TABLET | Freq: Two times a day (BID) | ORAL | Status: DC

## 2016-02-10 MED ORDER — LORAZEPAM 0.5 MG PO TABS
0.5000 mg | ORAL_TABLET | Freq: Three times a day (TID) | ORAL | Status: AC
Start: 2016-02-10 — End: ?

## 2016-02-10 MED ORDER — FENTANYL 50 MCG/HR TD PT72: 50.0000 ug | MEDICATED_PATCH | TRANSDERMAL | Status: DC

## 2016-02-10 MED ORDER — COLLAGENASE 250 UNIT/GM EX OINT: TOPICAL_OINTMENT | Freq: Every day | CUTANEOUS | Status: AC

## 2016-02-10 MED ORDER — OXYCODONE HCL 5 MG PO CAPS: 5.0000 mg | ORAL_CAPSULE | Freq: Three times a day (TID) | ORAL | Status: AC | PRN

## 2016-02-10 MED ORDER — SULFAMETHOXAZOLE-TRIMETHOPRIM 800-160 MG PO TABS: 1.0000 | ORAL_TABLET | Freq: Two times a day (BID) | ORAL | Status: AC

## 2016-02-10 NOTE — Progress Notes (Signed)
Patient will discharge to [facility] Anticipated discharge date:2/22 Family notified: pt wife Transportation by [employer]- scheduled for 2pm  CSW signing off.  Domenica Reamer, [place] Social Worker [phone]

## 2016-02-10 NOTE — Discharge Summary (Signed)
Physician Discharge Summary  [name] [id number] DOB: [birth year] DOA: 02/06/2016  PCP: [name], MD  Admit date: 02/06/2016 Discharge date: 02/10/2016  Time spent: 35 minutes  Recommendations for Outpatient Follow-up:  Maintain adequate hydration Follow heart healthy diet and modified carbohydrates  Follow up BMET in 5 days to follow electrolytes and renal function Please reassess CBC in 5 days to follow WBC's and hgb trend Repeat CXR in 2-3 weeks to follow resolution of infiltrates Outpatient follow up with Dr. Marlou Porch (cardiology service)  Discharge Diagnoses:  Active Problems:   HCAP (healthcare-associated pneumonia)   Pneumonia   Pressure ulcer   Pain in the chest   Chronic renal failure, stage 3 (moderate)   Benign essential HTN   Parkinson disease (HCC)   Anemia of chronic disease Diabetes mellitus type 2  Discharge Condition: stable and improved. Will discharge back to SNF for further care and treatment. Outpatient follow up with cardiology service and with his PCP in 14 days.  Diet recommendation: heart healthy diet and low carbohydrates   Filed Weights   02/07/16 0253 02/08/16 1700 02/10/16 0500  Weight: 83.28 kg (183 lb 9.6 oz) 83.26 kg (183 lb 8.9 oz) 76.5 kg (168 lb 10.4 oz)    History of present illness:  82 yo male with history of COPD, hypothyroidism, HTN, CKD, DM, CAD, dementia who was brought here from nursing home with cc of chest pain. When I asked him he said the pain was across his chest but now it's gone. He is not active so not sure it was not exertional. It was not positional. He has mild dyspnea. No fever, chills, cough, sore throat. He has chronic bed sores and they are painful. No N/V/D/C/abd pain/dysuria. Otherwise no complaints.   Hospital Course:  HCAP (healthcare-associated pneumonia) -Noted on admission CXR -Patient was initially covered with vanc/zosyn initially and per pharmacy/ID rec's will transitioned to Augmentin and  Bactrim course until 02/20/16 (to complete a total of 14 days) -remains afebrile and with just mild leukocytosis -Patient was recently treated for MRSA HCAP. -will follow up with PCP in 14 days -repeat CXR in 3 weeks  Chest Pain:  -Likely due to PNA -Currently stable and w/o pain -Trop had remained stable at 0.05 (flat elevation) -EKG w/o acute ischemic changes -Continue statin and asp -2D echo was ordered, findings of akinesis of the basalinferior myocardium -Consulted Cardiology - recommendation was for no further inpatient ischemic work up; cardiology service will follow as an outpatient as needed   COPD  -Condition stable/compensated and in not acute exacerbation  -Continue inhaled steroids/LABA and rest of home medication regimen   Hypothyroidism -Continue levothyroxine.  GERD -Continue Protonix 40 mg daily    Heels ulcers; stage 3:  -Continue local wound care; daily dressing and santyl applications. -patient will need constant repositioning and use of prevalon boots to remove pressure out of heels  DM2  -Continue home meds.  -continue modified carb diet -CBG's well controlled   Chronic diastolic heart failure -continue heart healthy diet/low sodium -controlled BP -follow daily weight -continue home diuretics regimen; no signs of fluid overload currently   Essential HTN -continue lasix and cardizem  -BP is stable   Mod-severe aortic stenosis  - per prior notes, would be marginal candidate for TAVR only if he demonstrated significant clinical improvement. We can have him follow up in the outpatient setting for further monitoring.  Multifocal atrial tachycardia  -continue cardizem -outpatient follow up with cardiology   Chronic kidney disease stage  3 -stable and at baseline  -continue monitoring trend as an outpatient -maintain adequate hydration    Procedures:  See below for x-ray reports   2-D echo: - Left ventricle: The cavity size was normal.  Systolic function was normal. The estimated ejection fraction was in the range of 60% to 65%. There is akinesis of the basalinferior myocardium. There was an increased relative contribution of atrial contraction to ventricular filling. Doppler parameters are consistent with abnormal left ventricular relaxation (grade 1 diastolic dysfunction). - Aortic valve: By calculated AVA the AS appears severe but by peak velocity and mean gradient it is moderate. Moderate diffuse thickening and calcification, consistent with sclerosis. There was moderate to severe stenosis. Valve area (VTI): 0.74 cm^2. Valve area (Vmax): 0.74 cm^2. Valve area (Vmean): 0.72 cm^2. - Pulmonary arteries: PA peak pressure: 33 mm Hg (S).  Consultations:  None   Discharge Exam: Filed Vitals:   02/10/16 0531 02/10/16 0902  BP: 124/92 129/99  Pulse: 72 82  Temp: 97.6 F (36.4 C)   Resp: 17     General: Awake, in nad, laying in bed. Denies CP and reports breathing is stable.  Cardiovascular: positive SEM, no rubs or gallops, s1, s2  Respiratory: normal resp effort, no wheezing; scattered rhonchi  Abdomen: soft, nondistended, pos BS  Musculoskeletal: perfused, no cyanosis, no clubbing    Discharge Instructions   Discharge Instructions    Diet - low sodium heart healthy    Complete by:  As directed      Discharge instructions    Complete by:  As directed   Maintain adequate hydration Follow heart healthy diet Follow up BMET in 5 days to follow electrolytes and renal function Please reassess CBC in 5 days to follow WBC's and hgb trend Repeat CXR in 2-3 weeks to follow resolution of infiltrates     Increase activity slowly    Complete by:  As directed           Current Discharge Medication List    START taking these medications   Details  amoxicillin-clavulanate (AUGMENTIN) 500-125 MG tablet Take 1 tablet (500 mg total) by mouth every 12 (twelve) hours.    collagenase  (SANTYL) ointment Apply topically daily. Apply daily on right heel, as part of pressure ulcer care.    sulfamethoxazole-trimethoprim (BACTRIM DS,SEPTRA DS) 800-160 MG tablet Take 1 tablet by mouth every 12 (twelve) hours.      CONTINUE these medications which have CHANGED   Details  fentaNYL (DURAGESIC - DOSED MCG/HR) 50 MCG/HR Place 1 patch (50 mcg total) onto the skin every 3 (three) days. Qty: 10 patch, Refills: 0    LORazepam (ATIVAN) 0.5 MG tablet Take 1 tablet (0.5 mg total) by mouth 3 (three) times daily. 8am, 12pm, 8pm Qty: 15 tablet, Refills: 0    oxycodone (OXY-IR) 5 MG capsule Take 1 capsule (5 mg total) by mouth every 8 (eight) hours as needed for pain. Qty: 15 capsule, Refills: 0      CONTINUE these medications which have NOT CHANGED   Details  acetaminophen (TYLENOL) 325 MG tablet Take 650 mg by mouth every 6 (six) hours as needed for mild pain, moderate pain, fever or headache.     aspirin EC 81 MG tablet Take 81 mg by mouth daily.     bisacodyl (DULCOLAX) 10 MG suppository Place 1 suppository (10 mg total) rectally once. Qty: 12 suppository, Refills: 0    carbidopa-levodopa (SINEMET) 10-100 MG per tablet Take 1 tablet by mouth at bedtime.  diltiazem (CARDIZEM CD) 240 MG 24 hr capsule Take 240 mg by mouth daily. Take with a 120 mg capsule for a 360 mg dose    diltiazem (DILACOR XR) 120 MG 24 hr capsule Take 120 mg by mouth daily. Take with a 240 mg capsule for a 360 mg dose    feeding supplement (ENSURE) PUDG Take 1 Container by mouth 3 (three) times daily between meals.    Fluticasone-Salmeterol (ADVAIR) 250-50 MCG/DOSE AEPB Inhale 1 puff into the lungs 2 (two) times daily. Rinse mouth after inhalation and spit out    furosemide (LASIX) 40 MG tablet Take 1 tablet (40 mg total) by mouth 2 (two) times daily. Qty: 30 tablet, Refills: 0    gabapentin (NEURONTIN) 300 MG capsule Take 1 capsule (300 mg total) by mouth 2 (two) to 3 (three)  times daily.    GLUCERNA  (GLUCERNA) LIQD Take 237 mLs by mouth See admin instructions. Offer 1 can (237 mls) by mouth between breakfast and lunch    ipratropium (ATROVENT) 0.02 % nebulizer solution Take 0.5 mg by nebulization every 6 (six) hours as needed for shortness of breath.     ipratropium-albuterol (DUONEB) 0.5-2.5 (3) MG/3ML SOLN Take 3 mLs by nebulization every 6 (six) hours as needed (shortness of breathe.).    levothyroxine (SYNTHROID, LEVOTHROID) 112 MCG tablet Take 112 mcg by mouth daily before breakfast.     linagliptin (TRADJENTA) 5 MG TABS tablet Take 1 tablet (5 mg total) by mouth daily. Qty: 30 tablet, Refills: 2    Magnesium Hydroxide (MILK OF MAGNESIA PO) Take 30 mLs by mouth daily as needed (mild constipation).    Melatonin-Pyridoxine (MELATIN PO) Take 1 tablet by mouth at bedtime.     Multiple Vitamin (MULTIVITAMIN WITH MINERALS) TABS tablet Take 1 tablet by mouth daily.    omeprazole (PRILOSEC) 20 MG capsule Take 20 mg by mouth daily before breakfast.     polyethylene glycol (MIRALAX / GLYCOLAX) packet Take 17 g by mouth daily. Qty: 14 each, Refills: 0    potassium chloride SA (K-DUR,KLOR-CON) 20 MEQ tablet Take 1 tablet (20 mEq total) by mouth 2 (two) times daily. Qty: 60 tablet, Refills: 0    QUEtiapine (SEROQUEL) 25 MG tablet Take 1 tablet (25 mg total) by mouth at bedtime.    sertraline (ZOLOFT) 100 MG tablet Take 100 mg by mouth at bedtime.     simvastatin (ZOCOR) 10 MG tablet Take 10 mg by mouth at bedtime.       STOP taking these medications     doxycycline (VIBRAMYCIN) 100 MG capsule      fluconazole (DIFLUCAN) 100 MG tablet        No Known Allergies Follow-up Information    Follow up with [facility].   Specialty:  Skilled Nursing Facility   Contact information:   [address] [place] [phone]      Follow up with [name], MD. Schedule an appointment as soon as possible for a visit in 14 days.    Specialty:  Internal Medicine   Contact information:   [place] [id number] [phone]        The results of significant diagnostics from this hospitalization (including imaging, microbiology, ancillary and laboratory) are listed below for reference.    Significant Diagnostic Studies: Dg Chest 2 View  01/15/2016  CLINICAL DATA:  Altered mental status. Recent treatment for pneumonia. Productive cough. History of COPD and hypertension. EXAM: CHEST  2  VIEW COMPARISON:  07/10/2015 FINDINGS: Cardiac enlargement with mild pulmonary vascular congestion. Interstitial changes in the lung bases likely representing mild edema. There is superimposed airspace disease in the left lung base with small left pleural effusion likely representing superimposed pneumonia. No pneumothorax. Calcified and tortuous aorta. Degenerative changes in the spine and shoulders. IMPRESSION: Cardiac enlargement with pulmonary vascular congestion and mild interstitial edema. Superimposed airspace consolidation in the left lung base with small left pleural effusion likely representing pneumonia. Electronically Signed   By: Lucienne Capers M.D.   On: 01/15/2016 21:14   Dg Op Swallowing Func-medicare/speech Path  02/04/2016  Objective Swallowing Evaluation: Type of Study: MBS-Modified Barium Swallow Study Patient Details Name: ADHAV CROXFORD MRN: [id number] Date of Birth: 09/29/1929 Today's Date: 02/04/2016 Time: SLP Start Time (ACUTE ONLY): 1206-SLP Stop Time (ACUTE ONLY): 1234 SLP Time Calculation (min) (ACUTE ONLY): 28 min Past Medical History: Past Medical History Diagnosis Date . Hyperlipidemia  . COPD (chronic obstructive pulmonary disease) ([place])  . Leg pain  . GERD (gastroesophageal reflux disease)  . Depression  . Dizziness  . Productive cough  . Thyroid disease  . Peripheral vascular disease ([place])  . ED (erectile dysfunction)  . Hypertension    08/17/12 - wife denies . Walking pneumonia 2000's . H/O hiatal  hernia  . Migraine    "when I was a young boy" . Arthritis    "hands" (08/28/2014) . Anxiety  . Hypothyroidism  . Chronic kidney disease (CKD), stage III (moderate)    [name] 08/28/2014 Past Surgical History: Past Surgical History Procedure Laterality Date . Renal artery stent Left  . Kidney stone surgery  1989 . Lower extremity angiogram Left 03-20-2014   Dr. Tinnie Gens . Tonsillectomy   . Cataract extraction, bilateral Bilateral  . Blepharoplasty Right  . Abdominal aortagram N/A 03/20/2013   Procedure: ABDOMINAL Maxcine Ham;  Surgeon: [name], MD;  Location: [facility] CATH LAB;  Service: Cardiovascular;  Laterality: N/A; . Lower extremity angiogram Left 03/20/2013   Procedure: LOWER EXTREMITY ANGIOGRAM;  Surgeon: [name], MD;  Location: [facility] CATH LAB;  Service: Cardiovascular;  Laterality: Left; HPI: 82 yo male presents for OP swallow study. PMH significant for COPD, aspiration PNA, esophageal dysmotility, GERD, and records from SNF idnicate Parkinson's disease. BSE during inpatient admission 1/28 recommended regular diet and thin liquids. Subjective: pt without complaints, denies difficulty swallowing Assessment / Plan / Recommendation CHL IP CLINICAL IMPRESSIONS 02/04/2016 Therapy Diagnosis Mild pharyngeal phase dysphagia Clinical Impression Pt has a mild pharyngeal dysphagia that is sensorimotor and structurally based. Mildly impaired timing, coupled with incomplete airway closure due to presence of suspected osteophytes impeding full epiglottic inversion results in silent penetration with all liquids and even purees. Thin liquids reach the level of the vocal cords, and require muliple cued coughs to clear. Thicker viscosities lead to more shallow penetration, making it easier for him to expel them from his laryngeal vestibule. Recommend Dys 3 diet and nectar thick liquids by cup with consistent throat clear post-swallow. Pt verbalized wanting to "go back to doing things the way they were". Primary SLP and MD may  wish to discuss other options with pt as well, including water protocol and/or accepting the risks of aspiration. Impact on safety and function Moderate aspiration risk   CHL IP TREATMENT RECOMMENDATION 02/04/2016 Treatment Recommendations Defer treatment plan to f/u with SLP   Prognosis 02/04/2016 Prognosis for Safe Diet Advancement Fair Barriers to Reach Goals -- Barriers/Prognosis Comment -- CHL IP DIET RECOMMENDATION 02/04/2016 SLP Diet Recommendations  Dysphagia 3 (Mech soft) solids;Nectar thick liquid Liquid Administration via Cup;No straw Medication Administration Crushed with puree Compensations Slow rate;Small sips/bites;Clear throat after each swallow Postural Changes Remain semi-upright after after feeds/meals (Comment);Seated upright at 90 degrees   CHL IP OTHER RECOMMENDATIONS 02/04/2016 Recommended Consults -- Oral Care Recommendations Oral care BID Other Recommendations --   CHL IP FOLLOW UP RECOMMENDATIONS 02/04/2016 Follow up Recommendations Skilled Nursing facility   [facility] IP FREQUENCY AND DURATION 01/16/2016 Speech Therapy Frequency (ACUTE ONLY) min 1 x/week Treatment Duration 1 week      CHL IP ORAL PHASE 02/04/2016 Oral Phase WFL Oral - Pudding Teaspoon -- Oral - Pudding Cup -- Oral - Honey Teaspoon -- Oral - Honey Cup -- Oral - Nectar Teaspoon -- Oral - Nectar Cup -- Oral - Nectar Straw -- Oral - Thin Teaspoon -- Oral - Thin Cup -- Oral - Thin Straw -- Oral - Puree -- Oral - Mech Soft -- Oral - Regular -- Oral - Multi-Consistency -- Oral - Pill -- Oral Phase - Comment --  CHL IP PHARYNGEAL PHASE 02/04/2016 Pharyngeal Phase Impaired Pharyngeal- Pudding Teaspoon -- Pharyngeal -- Pharyngeal- Pudding Cup -- Pharyngeal -- Pharyngeal- Honey Teaspoon -- Pharyngeal -- Pharyngeal- Honey Cup -- Pharyngeal -- Pharyngeal- Nectar Teaspoon -- Pharyngeal -- Pharyngeal- Nectar Cup Delayed swallow initiation-vallecula;Reduced airway/laryngeal closure;Penetration/Aspiration during swallow Pharyngeal Material enters airway,  remains ABOVE vocal cords and not ejected out Pharyngeal- Nectar Straw -- Pharyngeal -- Pharyngeal- Thin Teaspoon -- Pharyngeal -- Pharyngeal- Thin Cup Delayed swallow initiation-vallecula;Reduced airway/laryngeal closure;Penetration/Aspiration during swallow Pharyngeal Material enters airway, CONTACTS cords and not ejected out Pharyngeal- Thin Straw -- Pharyngeal -- Pharyngeal- Puree Delayed swallow initiation-vallecula;Reduced airway/laryngeal closure;Penetration/Aspiration during swallow Pharyngeal Material enters airway, remains ABOVE vocal cords and not ejected out Pharyngeal- Mechanical Soft Delayed swallow initiation-vallecula;Reduced airway/laryngeal closure;Pharyngeal residue - posterior pharnyx Pharyngeal -- Pharyngeal- Regular -- Pharyngeal -- Pharyngeal- Multi-consistency -- Pharyngeal -- Pharyngeal- Pill -- Pharyngeal -- Pharyngeal Comment --  CHL IP CERVICAL ESOPHAGEAL PHASE 02/04/2016 Cervical Esophageal Phase WFL Pudding Teaspoon -- Pudding Cup -- Honey Teaspoon -- Honey Cup -- Nectar Teaspoon -- Nectar Cup -- Nectar Straw -- Thin Teaspoon -- Thin Cup -- Thin Straw -- Puree -- Mechanical Soft -- Regular -- Multi-consistency -- Pill -- Cervical Esophageal Comment -- CHL IP GO 02/04/2016 Functional Assessment Tool Used skilled clinical judgment Functional Limitations Swallowing Swallow Current Status [id number]) CJ Swallow Goal Status [id number]) CJ Swallow Discharge Status [id number]) CJ Motor Speech Current Status [id number]) (None) Motor Speech Goal Status [id number]) (None) Motor Speech Goal Status [id number]) (None) Spoken Language Comprehension Current Status [id number]) (None) Spoken Language Comprehension Goal Status [id number]) (None) Spoken Language Comprehension Discharge Status [id number]) (None) Spoken Language Expression Current Status [id number]) (None) Spoken Language Expression Goal Status [id number]) (None) Spoken Language Expression Discharge Status [id number]) (None) Attention Current Status [id number]) (None) Attention Goal Status  [id number]) (None) Attention Discharge Status [id number]) (None) Memory Current Status AE:[id number]) (None) Memory Goal Status GI:[id number]) (None) Memory Discharge Status [id number]) (None) Voice Current Status [id number]) (None) Voice Goal Status [id number]) (None) Voice Discharge Status [id number]) (None) Other Speech-Language Pathology Functional Limitation [id number]) (None) Other Speech-Language Pathology Functional Limitation Goal Status [id number]) (None) Other Speech-Language Pathology Functional Limitation Discharge Status [phone]) (None) Germain Osgood, M.A. CCC-SLP [phone] Germain Osgood 02/04/2016, 4:20 PM      CLINICAL DATA:  Dysphagia.  COPD.  Parkinson's disease. EXAM: MODIFIED BARIUM SWALLOW TECHNIQUE: Different consistencies of barium were administered orally to the patient by the Speech Pathologist. Imaging of the pharynx was performed in the lateral  projection. FLUOROSCOPY TIME:  Fluoroscopy Time:  1 minutes and 31 seconds Number of Acquired Images:  None COMPARISON:  Chest radiograph of 01/15/2016 FINDINGS: Thin liquid- demonstrates minimal penetration, including with chin tuck. Retention. Nectar thick liquid- minimal penetration, without aspiration. Pure- within normal limits Pure with cracker- no aspiration or penetration. Mild residual coating. IMPRESSION: Mild swallow dysfunction, as above. Please refer to the Speech Pathologists report for complete details and recommendations. Electronically Signed   By: Abigail Miyamoto M.D.   On: 02/04/2016 15:22   Dg Chest Port 1 View  02/10/2016  CLINICAL DATA:  Pneumonia. EXAM: PORTABLE CHEST 1 VIEW COMPARISON:  02/06/2016. FINDINGS: Mediastinum hilar structures normal. Cardiomegaly with bilateral pulmonary interstitial prominence suggesting congestive heart failure. Findings have progressed from prior exam. Underlying pneumonia cannot be excluded. Bilateral pleural effusions noted. No pneumothorax . IMPRESSION: Congestive heart failure with bilateral interstitial edema, and bilateral  pleural effusions. Changes have worsened from prior exam . Underlying pneumonia cannot be excluded. Electronically Signed   By: Marcello Moores  Register   On: 02/10/2016 07:46   Dg Chest Portable 1 View  02/06/2016  CLINICAL DATA:  Acute on chronic left-sided chest pain, former smoker EXAM: PORTABLE CHEST 1 VIEW COMPARISON:  01/15/2016 FINDINGS: Moderate cardiac enlargement similar to prior study infiltrate left lower lobe less severe when compared to the prior examination. Infiltrate right lower lobe similar to prior study. Vascular pattern normal. Bilateral shoulder arthropathy. IMPRESSION: Findings consistent with persistent but improved left lower lobe pneumonia. Also suspect right lower lobe pneumonia. Hyper attenuation to the right of midline new the medial aspect of the right diaphragm likely represents residual barium within distal esophagus given some rotation of the patient. Patient had a barium study performed 02/04/2016. Electronically Signed   By: Skipper Cliche M.D.   On: 02/06/2016 19:56    Microbiology: Recent Results (from the past 240 hour(s))  MRSA PCR Screening     Status: None   Collection Time: 02/07/16  2:45 AM  Result Value Ref Range Status   MRSA by PCR NEGATIVE NEGATIVE Final    Comment:        The GeneXpert MRSA Assay (FDA approved for NASAL specimens only), is one component of a comprehensive MRSA colonization surveillance program. It is not intended to diagnose MRSA infection nor to guide or monitor treatment for MRSA infections.      Labs: Basic Metabolic Panel:  Recent Labs Lab 02/06/16 1952 02/07/16 0025 02/08/16 [id number] 02/08/16 1644 02/09/16 0709 02/10/16 0648  NA 144 146* 144  --  141 141  K 3.4* 3.5 3.9  --  3.3* 4.0  CL 98* 100* 98*  --  98* 97*  CO2 31 32 31  --  30 31  GLUCOSE 119* 168* 106*  --  112* 110*  BUN 31* 29* 19  --  17 20  CREATININE 1.78* 1.78* 1.56*  --  1.58* 1.70*  CALCIUM 8.1* 7.8* 8.3*  --  8.1* 8.3*  MG  --   --   --  0.7*  1.5*  --    Liver Function Tests:  Recent Labs Lab 02/06/16 1952 02/07/16 0025  AST 17 20  ALT 11* 10*  ALKPHOS 79 69  BILITOT 0.8 0.6  PROT 6.6 6.1*  ALBUMIN 2.7* 2.5*   CBC:  Recent Labs Lab 02/06/16 1952 02/07/16 0025 02/08/16 0658 02/09/16 0709 02/10/16 0648  WBC 12.0* 10.6* 10.4 12.4* 12.2*  NEUTROABS  --  7.4  --   --   --  HGB 9.9* 9.8* 10.5* 9.4* 9.6*  HCT 32.6* 30.9* 33.0* 30.4* 30.0*  MCV 81.3 82.2 82.9 81.5 82.4  PLT 194 180 168 153 143*   Cardiac Enzymes:  Recent Labs Lab 02/07/16 0025 02/07/16 0847 02/07/16 1402 02/07/16 1957 02/08/16 0958  TROPONINI 0.05* 0.05* 0.06* 0.05* 0.05*   BNP: BNP (last 3 results)  Recent Labs  07/02/15 0250 07/08/15 1838 07/09/15 0425  BNP 181.6* 148.9* 215.9*   CBG:  Recent Labs Lab 02/07/16 1714  GLUCAP 97    Signed:  Barton Dubois MD.  [facility] 02/10/2016, 9:45 AM

## 2016-02-10 NOTE — Progress Notes (Signed)
RN called report to [employer]. No questions or concerns voiced when asked. Pt is to leave with [facility] at [facility] informed to get pt ready for discharge. Pt belongings packed and ready. Will continue to monitor pt. Pt laying in bed. Bed low and locked. Call bell within reach. Bed alarm on. Dorita Fray 02/10/2016 12:37 PM

## 2016-02-10 NOTE — Progress Notes (Signed)
Fentanyl patch removed and disposed, witnessed by [employer]. Dorita Fray 02/10/2016 0915

## 2016-02-11 ENCOUNTER — Other Ambulatory Visit: Payer: Self-pay

## 2016-02-11 MED ORDER — FENTANYL 50 MCG/HR TD PT72: 50.0000 ug | MEDICATED_PATCH | TRANSDERMAL | Status: AC

## 2016-02-11 NOTE — Telephone Encounter (Signed)
Faxed to [facility] Fax Number: [phone], Phone Number [phone]  

## 2016-02-12 ENCOUNTER — Encounter: Payer: Self-pay | Admitting: Internal Medicine

## 2016-02-12 ENCOUNTER — Non-Acute Institutional Stay (SKILLED_NURSING_FACILITY): Payer: Medicare Other | Admitting: Internal Medicine

## 2016-02-12 DIAGNOSIS — J189 Pneumonia, unspecified organism: Secondary | ICD-10-CM | POA: Diagnosis not present

## 2016-02-12 DIAGNOSIS — I110 Hypertensive heart disease with heart failure: Secondary | ICD-10-CM

## 2016-02-12 DIAGNOSIS — I471 Supraventricular tachycardia: Secondary | ICD-10-CM

## 2016-02-12 DIAGNOSIS — K219 Gastro-esophageal reflux disease without esophagitis: Secondary | ICD-10-CM

## 2016-02-12 DIAGNOSIS — J449 Chronic obstructive pulmonary disease, unspecified: Secondary | ICD-10-CM | POA: Diagnosis not present

## 2016-02-12 DIAGNOSIS — I5032 Chronic diastolic (congestive) heart failure: Secondary | ICD-10-CM | POA: Diagnosis not present

## 2016-02-12 DIAGNOSIS — E034 Atrophy of thyroid (acquired): Secondary | ICD-10-CM

## 2016-02-12 DIAGNOSIS — E038 Other specified hypothyroidism: Secondary | ICD-10-CM | POA: Diagnosis not present

## 2016-02-12 DIAGNOSIS — R0789 Other chest pain: Secondary | ICD-10-CM | POA: Diagnosis not present

## 2016-02-12 DIAGNOSIS — N183 Chronic kidney disease, stage 3 unspecified: Secondary | ICD-10-CM

## 2016-02-12 DIAGNOSIS — E1122 Type 2 diabetes mellitus with diabetic chronic kidney disease: Secondary | ICD-10-CM

## 2016-02-12 NOTE — Progress Notes (Signed)
MRN: [id number] Name: [name]  Sex: male Age: 82 y.o. DOB: 1929/01/05  [facility]: [name] Facility/Room:217 Level Of Care: SNF Provider: Inocencio Homes D Emergency Contacts: Extended Emergency Contact Information Primary Emergency Contact: [name],[name] Address: [address]          [place] Phone: [phone] Mobile Phone: [phone] Relation: Spouse Secondary Emergency Contact: [name],[name]  [place] Mobile Phone: [phone] Relation: Son  Code Status:   Allergies: Review of patient's allergies indicates no known allergies.  Chief Complaint  Patient presents with  . Readmit To SNF    HPI: Patient is 82 y.o. male with COPD, hypothyroidism, HTN, CKD, DM, CAD, dementia who was brought here from nursing home with cc of chest pain. When I asked him he said the pain was across his chest but now it's gone. He is not active so not sure it was not exertional. It was not positional. He has mild dyspnea. No fever, chills, cough, sore throat. Pt was admited to [facility] for 2/18-22 for HCAP. W/u revealed CP not likely cardiac. Pt is admitted to SNF with generalized weakness and for residential care. While at [facility] pt wil be followed for hypothyroidism, tx with synthroid, chronic CHF, tx with lasix and HTN, tx with Lasix and cardizem.  Past Medical History  Diagnosis Date  . Hyperlipidemia   . COPD (chronic obstructive pulmonary disease) ([place])   . Leg pain   . GERD (gastroesophageal reflux disease)   . Depression   . Hypothyroidism   . Peripheral vascular disease ([place])     a. noninvasive studies suggesting mod-severe arterial insufficiency confirmed by prior PV angiograms, followed by Dr. Donnetta Hutching).  . ED (erectile dysfunction)   . Hypertension     08/17/12 - wife denies  . H/O hiatal hernia   . Migraine     "when I was a young boy"  . Arthritis     "hands" (08/28/2014)  . Anxiety   . Chronic kidney disease (CKD),  stage III (moderate)     [name] 08/28/2014  . Diabetes mellitus with neuropathy ([place])   . Bipolar disorder ([place])   . Aortic stenosis     a. Mod-severe by echo.  . Chronic venous insufficiency   . NSVT (nonsustained ventricular tachycardia) ([place])     a. 12/2015 in setting of low Mg.  . Multifocal atrial tachycardia ([place])   . Renal artery stenosis (H[facility])     a. s/p prior L renal artery stent.  Marland Kitchen RBBB   . Chronic diastolic CHF (congestive heart failure) [facility])     Past Surgical History  Procedure Laterality Date  . Renal artery stent Left   . Kidney stone surgery  1989  . Lower extremity angiogram Left 03-20-2014    Dr. Tinnie Gens  . Tonsillectomy    . Cataract extraction, bilateral Bilateral   . Blepharoplasty Right   . Abdominal aortagram N/A 03/20/2013    Procedure: ABDOMINAL Maxcine Ham;  Surgeon: [name], MD;  Location: [facility] CATH LAB;  Service: Cardiovascular;  Laterality: N/A;  . Lower extremity angiogram Left 03/20/2013    Procedure: LOWER EXTREMITY ANGIOGRAM;  Surgeon: [name], MD;  Location: [facility] CATH LAB;  Service: Cardiovascular;  Laterality: Left;      Medication List       This list is accurate as of: 02/12/16  8:56 PM.  Always use your most recent med list.  acetaminophen 325 MG tablet  Commonly known as:  TYLENOL  Take 650 mg by mouth every 6 (six) hours as needed for mild pain, moderate pain, fever or headache.     amoxicillin-clavulanate 500-125 MG tablet  Commonly known as:  AUGMENTIN  Take 1 tablet (500 mg total) by mouth every 12 (twelve) hours.     aspirin EC 81 MG tablet  Take 81 mg by mouth daily.     bisacodyl 10 MG suppository  Commonly known as:  DULCOLAX  Place 1 suppository (10 mg total) rectally once.     carbidopa-levodopa 10-100 MG tablet  Commonly known as:  SINEMET IR  Take 1 tablet by mouth at bedtime.     collagenase ointment  Commonly known as:  SANTYL  Apply topically daily. Apply daily on right heel, as  part of pressure ulcer care.     diltiazem 120 MG 24 hr capsule  Commonly known as:  DILACOR XR  Take 120 mg by mouth daily. Take with a 240 mg capsule for a 360 mg dose     diltiazem 240 MG 24 hr capsule  Commonly known as:  CARDIZEM CD  Take 240 mg by mouth daily. Take with a 120 mg capsule for a 360 mg dose     GLUCERNA Liqd  Take 237 mLs by mouth See admin instructions. Offer 1 can (237 mls) by mouth between breakfast and lunch     feeding supplement (ENSURE) Pudg  Take 1 Container by mouth 3 (three) times daily between meals.     fentaNYL 50 MCG/HR  Commonly known as:  DURAGESIC - dosed mcg/hr  Place 1 patch (50 mcg total) onto the skin every 3 (three) days. REMOVE OLD PATCH NOTE SITE EXTERNAL USE ONLY     Fluticasone-Salmeterol 250-50 MCG/DOSE Aepb  Commonly known as:  ADVAIR  Inhale 1 puff into the lungs 2 (two) times daily. Rinse mouth after inhalation and spit out     furosemide 40 MG tablet  Commonly known as:  LASIX  Take 1 tablet (40 mg total) by mouth 2 (two) times daily.     gabapentin 300 MG capsule  Commonly known as:  NEURONTIN  Take 1 capsule (300 mg total) by mouth 2 (two) to 3 (three)  times daily.     ipratropium 0.02 % nebulizer solution  Commonly known as:  ATROVENT  Take 0.5 mg by nebulization every 6 (six) hours as needed for shortness of breath.     ipratropium-albuterol 0.5-2.5 (3) MG/3ML Soln  Commonly known as:  DUONEB  Take 3 mLs by nebulization every 6 (six) hours as needed (shortness of breathe.).     levothyroxine 112 MCG tablet  Commonly known as:  SYNTHROID, LEVOTHROID  Take 112 mcg by mouth daily before breakfast.     linagliptin 5 MG Tabs tablet  Commonly known as:  TRADJENTA  Take 1 tablet (5 mg total) by mouth daily.     LORazepam 0.5 MG tablet  Commonly known as:  ATIVAN  Take 1 tablet (0.5 mg total) by mouth 3 (three) times daily. 8am, 12pm, 8pm     MELATIN PO  Take 1 tablet by mouth at bedtime.     MILK OF MAGNESIA PO   Take 30 mLs by mouth daily as needed (mild constipation).     multivitamin with minerals Tabs tablet  Take 1 tablet by mouth daily.     omeprazole 20 MG capsule  Commonly known as:  PRILOSEC  Take 20 mg by  mouth daily before breakfast.     oxycodone 5 MG capsule  Commonly known as:  OXY-IR  Take 1 capsule (5 mg total) by mouth every 8 (eight) hours as needed for pain.     polyethylene glycol packet  Commonly known as:  MIRALAX / GLYCOLAX  Take 17 g by mouth daily.     potassium chloride SA 20 MEQ tablet  Commonly known as:  K-DUR,KLOR-CON  Take 1 tablet (20 mEq total) by mouth 2 (two) times daily.     QUEtiapine 25 MG tablet  Commonly known as:  SEROQUEL  Take 1 tablet (25 mg total) by mouth at bedtime.     sertraline 100 MG tablet  Commonly known as:  ZOLOFT  Take 100 mg by mouth at bedtime.     simvastatin 10 MG tablet  Commonly known as:  ZOCOR  Take 10 mg by mouth at bedtime.     sulfamethoxazole-trimethoprim 800-160 MG tablet  Commonly known as:  BACTRIM DS,SEPTRA DS  Take 1 tablet by mouth every 12 (twelve) hours.        No orders of the defined types were placed in this encounter.    Immunization History  Administered Date(s) Administered  . Influenza,inj,Quad PF,36+ Mos 08/30/2014    Social History  Substance Use Topics  . Smoking status: Former Smoker -- 1.00 packs/day for 25 years    Types: Cigarettes    Quit date: 08/17/2012  . Smokeless tobacco: Never Used  . Alcohol Use: 0.0 oz/week    0 Standard drinks or equivalent per week     Comment: 08/28/2014 "last drink was in ~ 2012"    Family history is+ AAA, ETOH abuse   Review of Systems  DATA OBTAINED: from patient, nurse, wife GENERAL:  no fevers, fatigue, appetite changes SKIN: No itching, rash; wounds dressed EYES: No eye pain, redness, discharge EARS: No earache, tinnitus, change in hearing NOSE: No congestion, drainage or bleeding  MOUTH/THROAT: No mouth or tooth pain, No sore  throat RESPIRATORY: No cough, wheezing, SOB CARDIAC: No chest pain, palpitations, lower extremity edema  GI: No abdominal pain, No N/V/D or constipation, No heartburn or reflux ; pt wants to drink regular ice water GU: No dysuria, frequency or urgency, or incontinence  MUSCULOSKELETAL: No unrelieved bone/joint pain NEUROLOGIC: No headache, dizziness or focal weakness PSYCHIATRIC: No c/o anxiety or sadness   Filed Vitals:   02/12/16 1158  BP: 130/71  Pulse: 68  Temp: 97.6 F (36.4 C)  Resp: 16    SpO2 Readings from Last 1 Encounters:  02/10/16 98%        Physical Exam  GENERAL APPEARANCE: Alert, conversant,  No acute distress.  SKIN: No diaphoresis rash HEAD: Normocephalic, atraumatic  EYES: Conjunctiva/lids clear. Pupils round, reactive. EOMs intact.  EARS: External exam WNL, canals clear. Hearing grossly normal.  NOSE: No deformity or discharge.  MOUTH/THROAT: Lips w/o lesions  RESPIRATORY: Breathing is even, unlabored. Lung sounds are clear   CARDIOVASCULAR: Heart RRR no murmurs, rubs or gallops. No peripheral edema.   GASTROINTESTINAL: Abdomen is soft, non-tender, not distended w/ normal bowel sounds; some coughing with eating  Soft mechanical. GENITOURINARY: Bladder non tender, not distended  MUSCULOSKELETAL: No abnormal joints or musculature NEUROLOGIC:  Cranial nerves 2-12 grossly intact. Moves all extremities  PSYCHIATRIC: Mood and affect appropriate to situation, no behavioral issues  Patient Active Problem List   Diagnosis Date Noted  . Pressure ulcer 02/10/2016  . Pain in the chest   . Chronic renal failure,  stage 3 (moderate)   . Benign essential HTN   . Parkinson disease ([place])   . Anemia of chronic disease   . Pneumonia 02/06/2016  . Wounds, multiple open, lower extremity 01/23/2016  . Nonsustained ventricular tachycardia ([place]) 01/19/2016  . MRSA pneumonia ([place]) 01/19/2016  . Diabetic neuropathy ([place]) 01/18/2016  . Chronic venous insufficiency  01/18/2016  . Acute kidney injury ([place]) 01/17/2016  . Hypomagnesemia 01/16/2016  . Altered mental status 01/16/2016  . Stage III chronic kidney disease 01/16/2016  . HCAP (healthcare-associated pneumonia) 01/04/2016  . Hypotension 11/23/2015  . Candida rash of groin 09/23/2015  . Bipolar disorder ([place]) 07/14/2015  . Aortic stenosis 07/09/2015  . Chronic diastolic CHF (congestive heart failure) ([place]) 07/09/2015  . Sepsis due to pneumonia ([place]) 07/08/2015  . Acute respiratory failure with hypoxia ([place]) 07/02/2015  . Pulmonary edema 07/02/2015  . Leukocytosis 07/02/2015  . Type 2 diabetes mellitus with stage 3 chronic kidney disease ([place]) 07/02/2015  . Chronic osteomyelitis of right foot ([place]) 11/11/2014  . Beta-hemolytic group B streptococcal sepsis ([place]) 11/11/2014  . Bacteremia 11/11/2014  . Atherosclerotic PVD with ulceration ([place]) 10/28/2014  . Hyperlipidemia 10/07/2014  . Obesity 10/07/2014  . Moderate aortic stenosis 10/07/2014  . Multifocal atrial tachycardia ([place]) 10/06/2014  . Sepsis ([place]) 10/05/2014  . Wound of right leg 08/28/2014  . Foot ulcer ([place]) 01/04/2013  . Cellulitis 01/04/2013  . PAD (peripheral artery disease) ([place]) 01/04/2013  . Bilateral lower extremity edema 10/23/2012  . Atherosclerosis of native arteries of the extremities with ulceration ([place]) 10/23/2012  . Aspiration pneumonia ([place]) 08/17/2012  . Toxic encephalopathy 08/17/2012  . COPD with acute exacerbation ([place]) 08/17/2012  . Sacral decubitus ulcer 08/17/2012  . Chronic pain 08/17/2012  . Anxiety disorder 08/17/2012  . Chronic kidney disease, stage III (moderate) 08/17/2012  . Hypothyroidism 02/21/2008  . DYSLIPIDEMIA 02/21/2008  . ANXIETY DEPRESSION 02/21/2008  . RHEUMATIC FEVER 02/21/2008  . Hypertensive heart disease with CHF ([place]) 02/21/2008  . COPD (chronic obstructive pulmonary disease) ([place]) 02/21/2008  . ESOPHAGEAL MOTILITY DISORDER 02/21/2008  . ESOPHAGEAL DIVERTICULUM 02/21/2008  .  SLEEP APNEA 02/21/2008  . ATHEROSCLEROSIS 07/11/2007  . INGUINAL HERNIAS, BILATERAL 07/11/2007  . HERNIA, UMBILICAL [id number]  . CHOLELITHIASIS 07/11/2007  . UNSPECIFIED RENAL SCLEROSIS 07/11/2007  . TUBULOVILLOUS ADENOMA, COLON 11/02/2005  . GERD 11/02/2005  . DIVERTICULOSIS OF COLON 11/02/2005  . MELANOSIS COLI 11/02/2005    CBC    Component Value Date/Time   WBC 12.2* 02/10/2016 0648   RBC 3.64* 02/10/2016 0648   HGB 9.6* 02/10/2016 0648   HCT 30.0* 02/10/2016 0648   PLT 143* 02/10/2016 0648   MCV 82.4 02/10/2016 0648   LYMPHSABS 1.8 02/07/2016 0025   MONOABS 0.9 02/07/2016 0025   EOSABS 0.4 02/07/2016 0025   BASOSABS 0.0 02/07/2016 0025    CMP     Component Value Date/Time   NA 141 02/10/2016 0648   K 4.0 02/10/2016 0648   CL 97* 02/10/2016 0648   CO2 31 02/10/2016 0648   GLUCOSE 110* 02/10/2016 0648   BUN 20 02/10/2016 0648   CREATININE 1.70* 02/10/2016 0648   CALCIUM 8.3* 02/10/2016 0648   PROT 6.1* 02/07/2016 0025   ALBUMIN 2.5* 02/07/2016 0025   AST 20 02/07/2016 0025   ALT 10* 02/07/2016 0025   ALKPHOS 69 02/07/2016 0025   BILITOT 0.6 02/07/2016 0025   GFRNONAA 35* 02/10/2016 0648   GFRAA 41* 02/10/2016 0648    Lab Results  Component Value Date   HGBA1C 6.4* 10/07/2014  Dg Chest Portable 1 View  02/06/2016  CLINICAL DATA:  Acute on chronic left-sided chest pain, former smoker EXAM: PORTABLE CHEST 1 VIEW COMPARISON:  01/15/2016 FINDINGS: Moderate cardiac enlargement similar to prior study infiltrate left lower lobe less severe when compared to the prior examination. Infiltrate right lower lobe similar to prior study. Vascular pattern normal. Bilateral shoulder arthropathy. IMPRESSION: Findings consistent with persistent but improved left lower lobe pneumonia. Also suspect right lower lobe pneumonia. Hyper attenuation to the right of midline new the medial aspect of the right diaphragm likely represents residual barium within distal esophagus given  some rotation of the patient. Patient had a barium study performed 02/04/2016. Electronically Signed   By: Skipper Cliche M.D.   On: 02/06/2016 19:56    Not all labs, radiology exams or other studies done during hospitalization come through on my EPIC note; however they are reviewed by me.    Assessment and Plan  HCAP (healthcare-associated pneumonia) Noted on admission CXR -Patient was initially covered with vanc/zosyn initially and per pharmacy/ID rec's will transitioned to Augmentin and Bactrim course until 02/20/16 (to complete a total of 14 days) -remains afebrile and with just mild leukocytosis -Patient was recently treated for MRSA HCAP SNF - cont Augmentin and bactrim until 02/20/16  Pain in the chest Likely due to PNA -Currently stable and w/o pain -Trop had remained stable at 0.05 (flat elevation) -EKG w/o acute ischemic changes -Continue statin and asp -2D echo was ordered, findings of akinesis of the basalinferior myocardium -Consulted Cardiology - recommendation was for no further inpatient ischemic work up; cardiology service will follow as an outpatient as needed    COPD (chronic obstructive pulmonary disease) ([place]) Condition stable/compensated and in not acute exacerbation  SNF -Continue inhaled steroids/LABA and prn nebs  GERD SNF - cont protonix 40 mg daily  Hypothyroidism SNF - not stated as uncontrolled ;cont synthroid 112 mcg daily  Type 2 diabetes mellitus with stage 3 chronic kidney disease ([place]) SNF - controlled on tradjenta 5 mg daily; pt on statin  Chronic diastolic CHF (congestive heart failure) continue heart healthy diet/low sodium -controlled BP SNF - cont lasix 40 mg BID  Hypertensive heart disease with CHF SNF - cont lasix and cardiazem 360 MG DAILY; CONTROLLED  Multifocal atrial tachycardia SNF -controlled on cardizem;cont current med   Chronic renal failure, stage 3 (moderate) SNF - reported to be at baseline; will f/u BMP   Time  spent . 45 min;> 50% of time with patient was spent reviewing records, labs, tests and studies, counseling and developing plan of care  Hennie Duos, MD

## 2016-02-12 NOTE — Assessment & Plan Note (Signed)
SNF - not stated as uncontrolled ;cont synthroid 112 mcg daily

## 2016-02-12 NOTE — Assessment & Plan Note (Signed)
SNF - cont lasix and cardiazem 360 MG DAILY; CONTROLLED

## 2016-02-12 NOTE — Assessment & Plan Note (Signed)
SNF - cont protonix 40 mg daily 

## 2016-02-12 NOTE — Assessment & Plan Note (Signed)
Likely due to PNA -Currently stable and w/o pain -Trop had remained stable at 0.05 (flat elevation) -EKG w/o acute ischemic changes -Continue statin and asp -2D echo was ordered, findings of akinesis of the basalinferior myocardium -Consulted Cardiology - recommendation was for no further inpatient ischemic work up; cardiology service will follow as an outpatient as needed

## 2016-02-12 NOTE — Assessment & Plan Note (Signed)
Noted on admission CXR -Patient was initially covered with vanc/zosyn initially and per pharmacy/ID rec's will transitioned to Augmentin and Bactrim course until 02/20/16 (to complete a total of 14 days) -remains afebrile and with just mild leukocytosis -Patient was recently treated for MRSA HCAP SNF - cont Augmentin and bactrim until 02/20/16

## 2016-02-12 NOTE — Assessment & Plan Note (Signed)
Condition stable/compensated and in not acute exacerbation  SNF -Continue inhaled steroids/LABA and prn nebs

## 2016-02-12 NOTE — Assessment & Plan Note (Signed)
continue heart healthy diet/low sodium -controlled BP SNF - cont lasix 40 mg BID

## 2016-02-12 NOTE — Assessment & Plan Note (Signed)
SNF - reported to be at baseline; will f/u BMP

## 2016-02-12 NOTE — Assessment & Plan Note (Signed)
SNF - controlled on tradjenta 5 mg daily; pt on statin

## 2016-02-12 NOTE — Assessment & Plan Note (Signed)
SNF -controlled on cardizem;cont current med

## 2016-02-17 ENCOUNTER — Encounter: Payer: Self-pay | Admitting: Internal Medicine

## 2016-02-17 ENCOUNTER — Non-Acute Institutional Stay (SKILLED_NURSING_FACILITY): Payer: Medicare Other | Admitting: Internal Medicine

## 2016-02-17 DIAGNOSIS — Z7189 Other specified counseling: Secondary | ICD-10-CM

## 2016-02-17 NOTE — Progress Notes (Signed)
MRN: [id number] Name: [name]  Sex: male Age: 82 y.o. DOB: 1929-02-09  [place] #: [facility]os[facility] Facility/Room: Level Of Care: SNF Provider: Inocencio Homes D Emergency Contacts: Extended Emergency Contact Information Primary Emergency Contact: [name],[name] Address: [address]          [place] Phone: [phone] Mobile Phone: [phone] Relation: Spouse Secondary Emergency Contact: [name],[name]  [place] Mobile Phone: [phone] Relation: Son  Code Status:   Allergies: Review of patient's allergies indicates no known allergies.  Chief Complaint  Patient presents with  . family conference with patient present    HPI: Patient is 82 y.o. male who is being seen today along with is wife to discuss a dysphagia waiver. I would like to take it a step further and discuss future hospitalizations as well and other MOST form issues.  Past Medical History  Diagnosis Date  . Hyperlipidemia   . COPD (chronic obstructive pulmonary disease) ([place])   . Leg pain   . GERD (gastroesophageal reflux disease)   . Depression   . Hypothyroidism   . Peripheral vascular disease ([place])     a. noninvasive studies suggesting mod-severe arterial insufficiency confirmed by prior PV angiograms, followed by Dr. Donnetta Hutching).  . ED (erectile dysfunction)   . Hypertension     08/17/12 - wife denies  . H/O hiatal hernia   . Migraine     "when I was a young boy"  . Arthritis     "hands" (08/28/2014)  . Anxiety   . Chronic kidney disease (CKD), stage III (moderate)     [name] 08/28/2014  . Diabetes mellitus with neuropathy ([place])   . Bipolar disorder ([place])   . Aortic stenosis     a. Mod-severe by echo.  . Chronic venous insufficiency   . NSVT (nonsustained ventricular tachycardia) ([place])     a. 12/2015 in setting of low Mg.  . Multifocal atrial tachycardia ([place])   . Renal artery stenosis ([facility])     a. s/p prior L renal artery stent.  Marland Kitchen  RBBB   . Chronic diastolic CHF (congestive heart failure) [facility])     Past Surgical History  Procedure Laterality Date  . Renal artery stent Left   . Kidney stone surgery  1989  . Lower extremity angiogram Left 03-20-2014    Dr. Tinnie Gens  . Tonsillectomy    . Cataract extraction, bilateral Bilateral   . Blepharoplasty Right   . Abdominal aortagram N/A 03/20/2013    Procedure: ABDOMINAL Maxcine Ham;  Surgeon: [name], MD;  Location: [facility] CATH LAB;  Service: Cardiovascular;  Laterality: N/A;  . Lower extremity angiogram Left 03/20/2013    Procedure: LOWER EXTREMITY ANGIOGRAM;  Surgeon: [name], MD;  Location: [facility] CATH LAB;  Service: Cardiovascular;  Laterality: Left;      Medication List       This list is accurate as of: 02/17/16 11:59 PM.  Always use your most recent med list.               acetaminophen 325 MG tablet  Commonly known as:  TYLENOL  Take 650 mg by mouth every 6 (six) hours as needed for mild pain, moderate pain, fever or headache.     amoxicillin-clavulanate 500-125 MG tablet  Commonly known as:  AUGMENTIN  Take 1 tablet (500 mg total) by mouth every 12 (twelve) hours.     aspirin EC 81 MG tablet  Take 81 mg by mouth daily.  bisacodyl 10 MG suppository  Commonly known as:  DULCOLAX  Place 1 suppository (10 mg total) rectally once.     carbidopa-levodopa 10-100 MG tablet  Commonly known as:  SINEMET IR  Take 1 tablet by mouth at bedtime.     collagenase ointment  Commonly known as:  SANTYL  Apply topically daily. Apply daily on right heel, as part of pressure ulcer care.     diltiazem 120 MG 24 hr capsule  Commonly known as:  DILACOR XR  Take 120 mg by mouth daily. Take with a 240 mg capsule for a 360 mg dose     diltiazem 240 MG 24 hr capsule  Commonly known as:  CARDIZEM CD  Take 240 mg by mouth daily. Take with a 120 mg capsule for a 360 mg dose     GLUCERNA Liqd  Take 237 mLs by mouth See admin instructions. Offer 1 can (237 mls)  by mouth between breakfast and lunch     feeding supplement (ENSURE) Pudg  Take 1 Container by mouth 3 (three) times daily between meals.     fentaNYL 50 MCG/HR  Commonly known as:  DURAGESIC - dosed mcg/hr  Place 1 patch (50 mcg total) onto the skin every 3 (three) days. REMOVE OLD PATCH NOTE SITE EXTERNAL USE ONLY     Fluticasone-Salmeterol 250-50 MCG/DOSE Aepb  Commonly known as:  ADVAIR  Inhale 1 puff into the lungs 2 (two) times daily. Rinse mouth after inhalation and spit out     furosemide 40 MG tablet  Commonly known as:  LASIX  Take 1 tablet (40 mg total) by mouth 2 (two) times daily.     gabapentin 300 MG capsule  Commonly known as:  NEURONTIN  Take 1 capsule (300 mg total) by mouth 2 (two) to 3 (three)  times daily.     ipratropium 0.02 % nebulizer solution  Commonly known as:  ATROVENT  Take 0.5 mg by nebulization every 6 (six) hours as needed for shortness of breath.     ipratropium-albuterol 0.5-2.5 (3) MG/3ML Soln  Commonly known as:  DUONEB  Take 3 mLs by nebulization every 6 (six) hours as needed (shortness of breathe.).     levothyroxine 112 MCG tablet  Commonly known as:  SYNTHROID, LEVOTHROID  Take 112 mcg by mouth daily before breakfast.     linagliptin 5 MG Tabs tablet  Commonly known as:  TRADJENTA  Take 1 tablet (5 mg total) by mouth daily.     LORazepam 0.5 MG tablet  Commonly known as:  ATIVAN  Take 1 tablet (0.5 mg total) by mouth 3 (three) times daily. 8am, 12pm, 8pm     MELATIN PO  Take 1 tablet by mouth at bedtime.     MILK OF MAGNESIA PO  Take 30 mLs by mouth daily as needed (mild constipation).     multivitamin with minerals Tabs tablet  Take 1 tablet by mouth daily.     omeprazole 20 MG capsule  Commonly known as:  PRILOSEC  Take 20 mg by mouth daily before breakfast.     oxycodone 5 MG capsule  Commonly known as:  OXY-IR  Take 1 capsule (5 mg total) by mouth every 8 (eight) hours as needed for pain.     polyethylene glycol  packet  Commonly known as:  MIRALAX / GLYCOLAX  Take 17 g by mouth daily.     potassium chloride SA 20 MEQ tablet  Commonly known as:  K-DUR,KLOR-CON  Take 1 tablet (  20 mEq total) by mouth 2 (two) times daily.     QUEtiapine 25 MG tablet  Commonly known as:  SEROQUEL  Take 1 tablet (25 mg total) by mouth at bedtime.     sertraline 100 MG tablet  Commonly known as:  ZOLOFT  Take 100 mg by mouth at bedtime.     simvastatin 10 MG tablet  Commonly known as:  ZOCOR  Take 10 mg by mouth at bedtime.     sulfamethoxazole-trimethoprim 800-160 MG tablet  Commonly known as:  BACTRIM DS,SEPTRA DS  Take 1 tablet by mouth every 12 (twelve) hours.        No orders of the defined types were placed in this encounter.    Immunization History  Administered Date(s) Administered  . Influenza,inj,Quad PF,36+ Mos 08/30/2014    Social History  Substance Use Topics  . Smoking status: Former Smoker -- 1.00 packs/day for 25 years    Types: Cigarettes    Quit date: 08/17/2012  . Smokeless tobacco: Never Used  . Alcohol Use: 0.0 oz/week    0 Standard drinks or equivalent per week     Comment: 08/28/2014 "last drink was in ~ 2012"    Review of Systems  DATA OBTAINED: from patient, nurse, wife GENERAL:  no fevers, fatigue, appetite changes SKIN: No itching, rash HEENT: No complaint RESPIRATORY: No cough, wheezing, SOB CARDIAC: No chest pain, palpitations, +lower extremity edema - was good in hospital, pt was in bed the entire time;asSNF he sits with his feeet on the floorall the time GI: No abdominal pain, No N/V/D or constipation, No heartburn or reflux  GU: No dysuria, frequency or urgency, or incontinence  MUSCULOSKELETAL: No unrelieved bone/joint pain NEUROLOGIC: No headache, dizziness  PSYCHIATRIC: No overt anxiety or sadness  Filed Vitals:   02/17/16 2046  BP: 130/65  Pulse: 80  Temp: 97.6 F (36.4 C)  Resp: 20    Physical Exam  GENERAL APPEARANCE: Alert, conversant,WM  sitting in WC, No acute distress  SKIN: No diaphoresis rash, or wounds HEENT: Unremarkable RESPIRATORY: Breathing is even, unlabored. Lung sounds are clear   CARDIOVASCULAR: Heart RRR no murmurs, rubs or gallops. N+ peripheral edema  GASTROINTESTINAL: Abdomen is soft, non-tender, not distended w/ normal bowel sounds.  GENITOURINARY: Bladder non tender, not distended  MUSCULOSKELETAL: No abnormal joints or musculature NEUROLOGIC: Cranial nerves 2-12 grossly intact. Moves all extremities PSYCHIATRIC: Mood and affect appropriate to situation, no behavioral issues  Patient Active Problem List   Diagnosis Date Noted  . Encounter for family conference with patient present 02/20/2016  . Pressure ulcer 02/10/2016  . Pain in the chest   . Chronic renal failure, stage 3 (moderate)   . Benign essential HTN   . Parkinson disease ([place])   . Anemia of chronic disease   . Pneumonia 02/06/2016  . Wounds, multiple open, lower extremity 01/23/2016  . Nonsustained ventricular tachycardia ([place]) 01/19/2016  . MRSA pneumonia ([place]) 01/19/2016  . Diabetic neuropathy ([place]) 01/18/2016  . Chronic venous insufficiency 01/18/2016  . Acute kidney injury ([place]) 01/17/2016  . Hypomagnesemia 01/16/2016  . Altered mental status 01/16/2016  . Stage III chronic kidney disease 01/16/2016  . HCAP (healthcare-associated pneumonia) 01/04/2016  . Hypotension 11/23/2015  . Candida rash of groin 09/23/2015  . Bipolar disorder ([place]) 07/14/2015  . Aortic stenosis 07/09/2015  . Chronic diastolic CHF (congestive heart failure) ([place]) 07/09/2015  . Sepsis due to pneumonia ([place]) 07/08/2015  . Acute respiratory failure with hypoxia ([place]) 07/02/2015  . Pulmonary edema 07/02/2015  .  Leukocytosis 07/02/2015  . Type 2 diabetes mellitus with stage 3 chronic kidney disease ([place]) 07/02/2015  . Chronic osteomyelitis of right foot ([place]) 11/11/2014  . Beta-hemolytic group B streptococcal sepsis ([place]) 11/11/2014  . Bacteremia 11/11/2014   . Atherosclerotic PVD with ulceration ([place]) 10/28/2014  . Hyperlipidemia 10/07/2014  . Obesity 10/07/2014  . Moderate aortic stenosis 10/07/2014  . Multifocal atrial tachycardia ([place]) 10/06/2014  . Sepsis ([place]) 10/05/2014  . Wound of right leg 08/28/2014  . Foot ulcer ([place]) 01/04/2013  . Cellulitis 01/04/2013  . PAD (peripheral artery disease) ([place]) 01/04/2013  . Bilateral lower extremity edema 10/23/2012  . Atherosclerosis of native arteries of the extremities with ulceration ([place]) 10/23/2012  . Aspiration pneumonia ([place]) 08/17/2012  . Toxic encephalopathy 08/17/2012  . COPD with acute exacerbation ([place]) 08/17/2012  . Sacral decubitus ulcer 08/17/2012  . Chronic pain 08/17/2012  . Anxiety disorder 08/17/2012  . Chronic kidney disease, stage III (moderate) 08/17/2012  . Hypothyroidism 02/21/2008  . DYSLIPIDEMIA 02/21/2008  . ANXIETY DEPRESSION 02/21/2008  . RHEUMATIC FEVER 02/21/2008  . Hypertensive heart disease with CHF ([place]) 02/21/2008  . COPD (chronic obstructive pulmonary disease) ([place]) 02/21/2008  . ESOPHAGEAL MOTILITY DISORDER 02/21/2008  . ESOPHAGEAL DIVERTICULUM 02/21/2008  . SLEEP APNEA 02/21/2008  . ATHEROSCLEROSIS 07/11/2007  . INGUINAL HERNIAS, BILATERAL 07/11/2007  . HERNIA, UMBILICAL [id number]  . CHOLELITHIASIS 07/11/2007  . UNSPECIFIED RENAL SCLEROSIS 07/11/2007  . TUBULOVILLOUS ADENOMA, COLON 11/02/2005  . GERD 11/02/2005  . DIVERTICULOSIS OF COLON 11/02/2005  . MELANOSIS COLI 11/02/2005    CBC    Component Value Date/Time   WBC 12.2* 02/10/2016 0648   RBC 3.64* 02/10/2016 0648   HGB 9.6* 02/10/2016 0648   HCT 30.0* 02/10/2016 0648   PLT 143* 02/10/2016 0648   MCV 82.4 02/10/2016 0648   LYMPHSABS 1.8 02/07/2016 0025   MONOABS 0.9 02/07/2016 0025   EOSABS 0.4 02/07/2016 0025   BASOSABS 0.0 02/07/2016 0025    CMP     Component Value Date/Time   NA 141 02/10/2016 0648   K 4.0 02/10/2016 0648   CL 97* 02/10/2016 0648   CO2 31 02/10/2016 0648    GLUCOSE 110* 02/10/2016 0648   BUN 20 02/10/2016 0648   CREATININE 1.70* 02/10/2016 0648   CALCIUM 8.3* 02/10/2016 0648   PROT 6.1* 02/07/2016 0025   ALBUMIN 2.5* 02/07/2016 0025   AST 20 02/07/2016 0025   ALT 10* 02/07/2016 0025   ALKPHOS 69 02/07/2016 0025   BILITOT 0.6 02/07/2016 0025   GFRNONAA 35* 02/10/2016 0648   GFRAA 41* 02/10/2016 0648    Assessment and Plan  Encounter for family conference with patient present Present were myself, ST , DON , pt and pt's wife. Pt is unsafe to drink thin liquids but always wants to drink ice water out of a pitcher. Pt has silent aspiration who needs soft mechanical diet with chopped meat. Pt does not want chopped meat.We spoke frankly about the fact that pt is going to have aspiration PNA and has had 2 recent hospitalizations for PNA. Pt told us that he does not want to go through that again and does not want to go to the hospital again. He is not afraid to die, he wants quality and "if I die, I die". Wife and I filled out and signed a dysphagia waver and we also filled out a MOST form in which pt will not go to hospital, no CPR, no PEG but IVF for limited times prn and antibiotics prn on a case  by case basis.   Time spent 45 mn;> 50% of time with patient was spent reviewing records, labs, tests and studies, counseling and developing plan of care  Hennie Duos, MD

## 2016-02-18 ENCOUNTER — Non-Acute Institutional Stay (SKILLED_NURSING_FACILITY): Payer: Medicare Other | Admitting: Internal Medicine

## 2016-02-18 ENCOUNTER — Encounter: Payer: Self-pay | Admitting: Internal Medicine

## 2016-02-18 DIAGNOSIS — I5032 Chronic diastolic (congestive) heart failure: Secondary | ICD-10-CM | POA: Diagnosis not present

## 2016-02-18 DIAGNOSIS — R269 Unspecified abnormalities of gait and mobility: Secondary | ICD-10-CM

## 2016-02-18 DIAGNOSIS — J189 Pneumonia, unspecified organism: Secondary | ICD-10-CM

## 2016-02-18 DIAGNOSIS — A419 Sepsis, unspecified organism: Secondary | ICD-10-CM | POA: Diagnosis not present

## 2016-02-18 DIAGNOSIS — D72829 Elevated white blood cell count, unspecified: Secondary | ICD-10-CM | POA: Diagnosis not present

## 2016-02-18 DIAGNOSIS — N183 Chronic kidney disease, stage 3 unspecified: Secondary | ICD-10-CM

## 2016-02-18 NOTE — Progress Notes (Signed)
Patient ID: [name], male   DOB: 1929/02/24, 82 y.o.   MRN: [id number] MRN: [id number] Name: [name]  Sex: male Age: 82 y.o. DOB: 27-Dec-1928  [place] #: [name] Facility/Room:217 Level Of Care: SNF Provider: Wille Celeste Emergency Contacts: Extended Emergency Contact Information Primary Emergency Contact: [name],Bruce Address: [address]          [place] Phone: [phone] Mobile Phone: [phone] Relation: Spouse Secondary Emergency Contact: [name],Bruce  [place] Mobile Phone: [phone] Relation: Son  Code Status:   Allergies: Review of patient's allergies indicates no known allergies.  Chief Complaint  Patient presents with  . Acute Visit   secondary to follow up leukocytosis-renal insufficiency-fall this morning in facility  HPI: Patient is 82 y.o. male with COPD, hypothyroidism, HTN, CKD, DM, CAD, dementia who was brought here to hospital with cc of chest pain. t was admited to [facility] for 2/18-22 for HCAP. W/u revealed CP not likely cardiac. Pt  admitted to SNF with generalized weakness and for residential care. While at [facility] pt followed for hypothyroidism, tx with synthroid, chronic CHF, tx with lasix and HTN, tx with Lasix and cardizem.  Patient continues on a course of Augmentin and Bactrim until March 4 for total 14 days.  In regards to CHF he does continue on Lasix 40 mg twice a day potassium 20 mEq twice a day.  Updated labs on February 27 shows an elevated white count of 18.1 and appears on hospital discharge white count was around 12.  Creatinine also was 2.88 appears on discharge it was 1.70 does have a history of chronic kidney disease with baseline apparently in the higher ones range.  Potassium also was borderline elevated at 5.3.  Apparently earlier today patient did sustain a fall and has a very small contusion to his head otherwise no injuries neurologically he remains  intact he is not complaining of any pain headache dizziness or visual changes--there apparently was no loss of consciousness vital signs are stable  Patient has had some gradual decline he has indicated he does not want to go back to the hospital-Dr. Sheppard Coil did see him yesterday a a MOSTt form has been completed--per discussion with nursing and is my understanding they do not feel at this point hospice as needed since his needs can be met by nursing staff-again no aggressive measures appear to be desired here patient and his wife.  He is not complaining of any shortness of breath or pain.  I do note his wife has signed waiver that he can have a regular diet-apparently the mechanical soft nectar thick liquid diet does not agreeable to him i--apparently has led to poor by mouth intake-it is thought when back to regular diet will help with this although it  will increase the risk of aspiration and patient's  family is aware of this  Past Medical History  Diagnosis Date  . Hyperlipidemia   . COPD (chronic obstructive pulmonary disease) ([place])   . Leg pain   . GERD (gastroesophageal reflux disease)   . Depression   . Hypothyroidism   . Peripheral vascular disease ([place])     a. noninvasive studies suggesting mod-severe arterial insufficiency confirmed by prior PV angiograms, followed by Dr. Donnetta Hutching).  . ED (erectile dysfunction)   . Hypertension     08/17/12 - wife denies  . H/O hiatal hernia   . Migraine     "when I was a young boy"  .  Arthritis     "hands" (08/28/2014)  . Anxiety   . Chronic kidney disease (CKD), stage III (moderate)     [name] 08/28/2014  . Diabetes mellitus with neuropathy ([place])   . Bipolar disorder ([place])   . Aortic stenosis     a. Mod-severe by echo.  . Chronic venous insufficiency   . NSVT (nonsustained ventricular tachycardia) ([place])     a. 12/2015 in setting of low Mg.  . Multifocal atrial tachycardia ([place])   . Renal artery stenosis ([facility])     a. s/p prior L renal  artery stent.  Marland Kitchen RBBB   . Chronic diastolic CHF (congestive heart failure) [facility])     Past Surgical History  Procedure Laterality Date  . Renal artery stent Left   . Kidney stone surgery  1989  . Lower extremity angiogram Left 03-20-2014    Dr. Tinnie Gens  . Tonsillectomy    . Cataract extraction, bilateral Bilateral   . Blepharoplasty Right   . Abdominal aortagram N/A 03/20/2013    Procedure: ABDOMINAL Maxcine Ham;  Surgeon: [name], MD;  Location: [facility] CATH LAB;  Service: Cardiovascular;  Laterality: N/A;  . Lower extremity angiogram Left 03/20/2013    Procedure: LOWER EXTREMITY ANGIOGRAM;  Surgeon: [name], MD;  Location[facility] CATH LAB;  Service: Cardiovascular;  Laterality: Left;      Medication List       This list is accurate as of: 02/18/16 12:27 PM.  Always use your most recent med list.               acetaminophen 325 MG tablet  Commonly known as:  TYLENOL  Take 650 mg by mouth every 6 (six) hours as needed for mild pain, moderate pain, fever or headache.     amoxicillin-clavulanate 500-125 MG tablet  Commonly known as:  AUGMENTIN  Take 1 tablet (500 mg total) by mouth every 12 (twelve) hours.     aspirin EC 81 MG tablet  Take 81 mg by mouth daily.     bisacodyl 10 MG suppository  Commonly known as:  DULCOLAX  Place 1 suppository (10 mg total) rectally once.     carbidopa-levodopa 10-100 MG tablet  Commonly known as:  SINEMET IR  Take 1 tablet by mouth at bedtime.     collagenase ointment  Commonly known as:  SANTYL  Apply topically daily. Apply daily on right heel, as part of pressure ulcer care.     diltiazem 120 MG 24 hr capsule  Commonly known as:  DILACOR XR  Take 120 mg by mouth daily. Take with a 240 mg capsule for a 360 mg dose     diltiazem 240 MG 24 hr capsule  Commonly known as:  CARDIZEM CD  Take 240 mg by mouth daily. Take with a 120 mg capsule for a 360 mg dose     GLUCERNA Liqd  Take 237 mLs by mouth See admin instructions.  Offer 1 can (237 mls) by mouth between breakfast and lunch     feeding supplement (ENSURE) Pudg  Take 1 Container by mouth 3 (three) times daily between meals.     fentaNYL 50 MCG/HR  Commonly known as:  DURAGESIC - dosed mcg/hr  Place 1 patch (50 mcg total) onto the skin every 3 (three) days. REMOVE OLD PATCH NOTE SITE EXTERNAL USE ONLY     Fluticasone-Salmeterol 250-50 MCG/DOSE Aepb  Commonly known as:  ADVAIR  Inhale 1 puff into the lungs 2 (two) times daily. Rinse  mouth after inhalation and spit out     furosemide 40 MG tablet  Commonly known as:  LASIX  Take 1 tablet (40 mg total) by mouth 2 (two) times daily.     gabapentin 300 MG capsule  Commonly known as:  NEURONTIN  Take 1 capsule (300 mg total) by mouth 2 (two) to 3 (three)  times daily.     ipratropium 0.02 % nebulizer solution  Commonly known as:  ATROVENT  Take 0.5 mg by nebulization every 6 (six) hours as needed for shortness of breath.     ipratropium-albuterol 0.5-2.5 (3) MG/3ML Soln  Commonly known as:  DUONEB  Take 3 mLs by nebulization every 6 (six) hours as needed (shortness of breathe.).     levothyroxine 112 MCG tablet  Commonly known as:  SYNTHROID, LEVOTHROID  Take 112 mcg by mouth daily before breakfast.     linagliptin 5 MG Tabs tablet  Commonly known as:  TRADJENTA  Take 1 tablet (5 mg total) by mouth daily.     LORazepam 0.5 MG tablet  Commonly known as:  ATIVAN  Take 1 tablet (0.5 mg total) by mouth 3 (three) times daily. 8am, 12pm, 8pm     MELATIN PO  Take 1 tablet by mouth at bedtime.     MILK OF MAGNESIA PO  Take 30 mLs by mouth daily as needed (mild constipation).     multivitamin with minerals Tabs tablet  Take 1 tablet by mouth daily.     omeprazole 20 MG capsule  Commonly known as:  PRILOSEC  Take 20 mg by mouth daily before breakfast.     oxycodone 5 MG capsule  Commonly known as:  OXY-IR  Take 1 capsule (5 mg total) by mouth every 8 (eight) hours as needed for pain.      polyethylene glycol packet  Commonly known as:  MIRALAX / GLYCOLAX  Take 17 g by mouth daily.     potassium chloride SA 20 MEQ tablet  Commonly known as:  K-DUR,KLOR-CON  Take 1 tablet (20 mEq total) by mouth 2 (two) times daily.     QUEtiapine 25 MG tablet  Commonly known as:  SEROQUEL  Take 1 tablet (25 mg total) by mouth at bedtime.     sertraline 100 MG tablet  Commonly known as:  ZOLOFT  Take 100 mg by mouth at bedtime.     simvastatin 10 MG tablet  Commonly known as:  ZOCOR  Take 10 mg by mouth at bedtime.     sulfamethoxazole-trimethoprim 800-160 MG tablet  Commonly known as:  BACTRIM DS,SEPTRA DS  Take 1 tablet by mouth every 12 (twelve) hours.          Immunization History  Administered Date(s) Administered  . Influenza,inj,Quad PF,36+ Mos 08/30/2014    Social History  Substance Use Topics  . Smoking status: Former Smoker -- 1.00 packs/day for 25 years    Types: Cigarettes    Quit date: 08/17/2012  . Smokeless tobacco: Never Used  . Alcohol Use: 0.0 oz/week    0 Standard drinks or equivalent per week     Comment: 08/28/2014 "last drink was in ~ 2012"    Family history is+ AAA, ETOH abuse   Review of Systems  DATA OBTAINED: from patient, nurse, GENERAL:  no fevers, fatigue, appetite changes SKIN: No itching, rash; wounds dressed EYES: No eye pain, redness, discharge EARS: No earache, tinnitus, change in hearing NOSE: No congestion, drainage or bleeding  MOUTH/THROAT: No mouth or tooth pain,  No sore throat RESPIRATORY: No cough, wheezing, SOB CARDIAC: No chest pain, palpitations baseline, lower extremity edema  GI: No abdominal pain, No N/V/D or constipation, No heartburn or reflux ; pt wants to drink regular ice water GU: No dysuria, frequency or urgency, or incontinence  MUSCULOSKELETAL: No unrelieved bone/joint pain NEUROLOGIC: No headache, dizziness or focal weakness PSYCHIATRIC: No c/o anxiety or sadness   Filed Vitals:   02/18/16 1216  BP:  130/70  Pulse: 82  Temp: 97.1 F (36.2 C)  Resp: 18    O2 saturation is 95% on room air      Physical Exam  GENERAL APPEARANCE: Alert, conversant,  No acute distress.  SKIN: No diaphoresis rash very small abrasion to scalp area HEAD: Normocephalic, atraumatic  EYES: Conjunctiva/lids clear. Pupils round, reactive. EOMs intact.   NOSE: No deformity or discharge.  MOUTH/THROAT: Mucous membranes moist oropharynx clear  RESPIRATORY: Breathing is even, unlabored. Lung sounds are clear  but shallow CARDIOVASCULAR: Heart regular with some irregular beats no murmurs, rubs or gallops. Appears 2 plus peripheral edema.  --All of difficult Foley assistance both legs are wrapped GASTROINTESTINAL: Abdomen is soft, non-tender, not distended w/ normal bowel sounds; GENITOURINARY: Bladder non tender, not distended -- MUSCULOSKELETAL: No abnormal joints or musculature--does not appear to have pain with gentle range of motion of his upper and lower extremities bilaterally NEUROLOGIC:  Cranial nerves 2-12 grossly intact. Moves all extremities  PSYCHIATRIC: Mood and affect appropriate to situation, no behavioral issues  Patient Active Problem List   Diagnosis Date Noted  . Pressure ulcer 02/10/2016  . Pain in the chest   . Chronic renal failure, stage 3 (moderate)   . Benign essential HTN   . Parkinson disease ([place])   . Anemia of chronic disease   . Pneumonia 02/06/2016  . Wounds, multiple open, lower extremity 01/23/2016  . Nonsustained ventricular tachycardia ([place]) 01/19/2016  . MRSA pneumonia ([place]) 01/19/2016  . Diabetic neuropathy ([place]) 01/18/2016  . Chronic venous insufficiency 01/18/2016  . Acute kidney injury ([place]) 01/17/2016  . Hypomagnesemia 01/16/2016  . Altered mental status 01/16/2016  . Stage III chronic kidney disease 01/16/2016  . HCAP (healthcare-associated pneumonia) 01/04/2016  . Hypotension 11/23/2015  . Candida rash of groin 09/23/2015  . Bipolar disorder ([place])  07/14/2015  . Aortic stenosis 07/09/2015  . Chronic diastolic CHF (congestive heart failure) ([place]) 07/09/2015  . Sepsis due to pneumonia ([place]) 07/08/2015  . Acute respiratory failure with hypoxia ([place]) 07/02/2015  . Pulmonary edema 07/02/2015  . Leukocytosis 07/02/2015  . Type 2 diabetes mellitus with stage 3 chronic kidney disease ([place]) 07/02/2015  . Chronic osteomyelitis of right foot ([place]) 11/11/2014  . Beta-hemolytic group B streptococcal sepsis ([place]) 11/11/2014  . Bacteremia 11/11/2014  . Atherosclerotic PVD with ulceration ([place]) 10/28/2014  . Hyperlipidemia 10/07/2014  . Obesity 10/07/2014  . Moderate aortic stenosis 10/07/2014  . Multifocal atrial tachycardia ([place]) 10/06/2014  . Sepsis ([place]) 10/05/2014  . Wound of right leg 08/28/2014  . Foot ulcer ([place]) 01/04/2013  . Cellulitis 01/04/2013  . PAD (peripheral artery disease) ([place]) 01/04/2013  . Bilateral lower extremity edema 10/23/2012  . Atherosclerosis of native arteries of the extremities with ulceration ([place]) 10/23/2012  . Aspiration pneumonia ([place]) 08/17/2012  . Toxic encephalopathy 08/17/2012  . COPD with acute exacerbation ([place]) 08/17/2012  . Sacral decubitus ulcer 08/17/2012  . Chronic pain 08/17/2012  . Anxiety disorder 08/17/2012  . Chronic kidney disease, stage III (moderate) 08/17/2012  . Hypothyroidism 02/21/2008  . DYSLIPIDEMIA 02/21/2008  .  ANXIETY DEPRESSION 02/21/2008  . RHEUMATIC FEVER 02/21/2008  . Hypertensive heart disease with CHF ([place]) 02/21/2008  . COPD (chronic obstructive pulmonary disease) ([place]) 02/21/2008  . ESOPHAGEAL MOTILITY DISORDER 02/21/2008  . ESOPHAGEAL DIVERTICULUM 02/21/2008  . SLEEP APNEA 02/21/2008  . ATHEROSCLEROSIS 07/11/2007  . INGUINAL HERNIAS, BILATERAL 07/11/2007  . HERNIA, UMBILICAL 28/78/6767  . CHOLELITHIASIS 07/11/2007  . UNSPECIFIED RENAL SCLEROSIS 07/11/2007  . TUBULOVILLOUS ADENOMA, COLON 11/02/2005  . GERD 11/02/2005  . DIVERTICULOSIS OF COLON 11/02/2005  .  MELANOSIS COLI 11/02/2005   Labs.  02/15/2016.  WBC 18.1 hemoglobin 10.1 platelets 269.  Sodium 136 potassium 5.3 BUN 35 creatinine 2.88  CBC    Component Value Date/Time   WBC 12.2* 02/10/2016 0648   RBC 3.64* 02/10/2016 0648   HGB 9.6* 02/10/2016 0648   HCT 30.0* 02/10/2016 0648   PLT 143* 02/10/2016 0648   MCV 82.4 02/10/2016 0648   LYMPHSABS 1.8 02/07/2016 0025   MONOABS 0.9 02/07/2016 0025   EOSABS 0.4 02/07/2016 0025   BASOSABS 0.0 02/07/2016 0025    CMP     Component Value Date/Time   NA 141 02/10/2016 0648   K 4.0 02/10/2016 0648   CL 97* 02/10/2016 0648   CO2 31 02/10/2016 0648   GLUCOSE 110* 02/10/2016 0648   BUN 20 02/10/2016 0648   CREATININE 1.70* 02/10/2016 0648   CALCIUM 8.3* 02/10/2016 0648   PROT 6.1* 02/07/2016 0025   ALBUMIN 2.5* 02/07/2016 0025   AST 20 02/07/2016 0025   ALT 10* 02/07/2016 0025   ALKPHOS 69 02/07/2016 0025   BILITOT 0.6 02/07/2016 0025   GFRNONAA 35* 02/10/2016 0648   GFRAA 41* 02/10/2016 0648    Lab Results  Component Value Date   HGBA1C 6.4* 10/07/2014     Dg Chest Portable 1 View  02/06/2016  CLINICAL DATA:  Acute on chronic left-sided chest pain, former smoker EXAM: PORTABLE CHEST 1 VIEW COMPARISON:  01/15/2016 FINDINGS: Moderate cardiac enlargement similar to prior study infiltrate left lower lobe less severe when compared to the prior examination. Infiltrate right lower lobe similar to prior study. Vascular pattern normal. Bilateral shoulder arthropathy. IMPRESSION: Findings consistent with persistent but improved left lower lobe pneumonia. Also suspect right lower lobe pneumonia. Hyper attenuation to the right of midline new the medial aspect of the right diaphragm likely represents residual barium within distal esophagus given some rotation of the patient. Patient had a barium study performed 02/04/2016. Electronically Signed   By: Skipper Cliche M.D.   On: 02/06/2016 19:56    Not all labs, radiology exams or other  studies done during hospitalization come through on my EPIC note; however they are reviewed by me.    Assessment and Plan  #1-leukocytosis-white count appears to be trending up however this lab was drawn several days ago on February 27-at this point will update lab tomorrow to see where we stand he does not appear to be overtly septic again he does continue on his antibiotics Augmentin and Bactrim-he is afebrile vital signs are stable.  If white count is elevated consider obtaining updated chest x-ray and possibly urine-this plan was discussed with Dr. Sheppard Coil via phone.  #2 history of healthcare associated pneumonia as noted above he continues on anabolic therapy with Augmentin and Bactrim until March 4 clinically he appears stable although white count has trended up will update the lab.  COPD at this point appears to be stable clinically he is on inhaled steroids LABA and when necessary nebulizers.  #3-history of some element  of failure to thrive thought to be multifactorial with his comorbidities-again a MOS T form has been filled out-he does not want rehospitalization --.  I dietary waiver has been signed so he can have a  regular diet-apparently by mouth intake has been somewhat poor with the mechanical soft diet- Patient and family do understand the risks of aspiration pneumonia.  #4-history of chronic diastolic CHF-currently on Lasix 40 mg twice a day and potassium 20 mEq day-lab does show some increased renal insufficiency-this was discussed with Dr. Sheppard Coil via phone and at this point will monitor and see what the update labs tomorrow tell us.--At this point patient remains clinically at his baseline.  #5 history of chronic renal insufficiency as noted above creatinine has gone up some on lab February 27 up to 2.88-baseline appears to be in the high ones-again will await updated labs before making altercations if his Lasix.  I also borderline high potassium will hold potassium for  now  will  update a lab tomorrow to see where we stand with potassium level we may have to alter the dose here depending on results  #6 fall  In  facility-does not appear to have any appear significant injuries does have a small bruise to the top of his head-clinically appears stable at this point follow facility fall protocol  [id number]-of note greater than 35 minutes spent assessing patient-discussing his status with nursing staff-and Dr. Sheppard Coil via phone-and coordinating and formulating plan of care for numerous diagnoses-of note greater than 50% of time spent coordinating plan of care       Damier Disano C,

## 2016-02-20 DIAGNOSIS — Z7189 Other specified counseling: Secondary | ICD-10-CM | POA: Insufficient documentation

## 2016-02-20 NOTE — Assessment & Plan Note (Signed)
Present were myself, ST , DON , pt and pt's wife. Pt is unsafe to drink thin liquids but always wants to drink ice water out of a pitcher. Pt has silent aspiration who needs soft mechanical diet with chopped meat. Pt does not want chopped meat.We spoke frankly about the fact that pt is going to have aspiration PNA and has had 2 recent hospitalizations for PNA. Pt told us that he does not want to go through that again and does not want to go to the hospital again. He is not afraid to die, he wants quality and "if I die, I die". Wife and I filled out and signed a dysphagia waver and we also filled out a MOST form in which pt will not go to hospital, no CPR, no PEG but IVF for limited times prn and antibiotics prn on a case by case basis.

## 2016-02-24 ENCOUNTER — Non-Acute Institutional Stay (SKILLED_NURSING_FACILITY): Payer: Medicare Other | Admitting: Internal Medicine

## 2016-02-24 DIAGNOSIS — R1031 Right lower quadrant pain: Secondary | ICD-10-CM | POA: Diagnosis not present

## 2016-02-24 DIAGNOSIS — B3789 Other sites of candidiasis: Secondary | ICD-10-CM

## 2016-03-04 ENCOUNTER — Non-Acute Institutional Stay (SKILLED_NURSING_FACILITY): Payer: Medicare Other | Admitting: Internal Medicine

## 2016-03-04 DIAGNOSIS — H109 Unspecified conjunctivitis: Secondary | ICD-10-CM | POA: Diagnosis not present

## 2016-03-12 ENCOUNTER — Encounter: Payer: Self-pay | Admitting: Internal Medicine

## 2016-03-12 NOTE — Progress Notes (Signed)
MRN: [id number] Name: [name]  Sex: male Age: 81 y.o. DOB: [birth year]  [place] [facility]: [name] Facility/Room:217 Level Of Care: SNF Provider: Inocencio Homes D Emergency Contacts: Extended Emergency Contact Information Primary Emergency Contact: [name] Address: [place]          Raelene Bott of [place] Phone: [phone] Mobile Phone: [phone] Relation: Spouse Secondary Emergency Contact: [name]  [place] Mobile Phone: [phone] Relation: Son  Code Status:   Allergies: Review of patient's allergies indicates no known allergies.  Chief Complaint  Patient presents with  . Acute Visit    HPI: Patient is 82 y.o. male who nursing asked me to see for redness under his L eye for 2 days. He told her that it hurt. Denies discharge. No fever, cold or cough or allergy symtoms. Pt enies any itching to area.  Past Medical History  Diagnosis Date  . Hyperlipidemia   . COPD (chronic obstructive pulmonary disease) ([place])   . Leg pain   . GERD (gastroesophageal reflux disease)   . Depression   . Hypothyroidism   . Peripheral vascular disease ([place])     a. noninvasive studies suggesting mod-severe arterial insufficiency confirmed by prior PV angiograms, followed by Dr. Donnetta Hutching).  . ED (erectile dysfunction)   . Hypertension     08/17/12 - wife denies  . H/O hiatal hernia   . Migraine     "when I was a young boy"  . Arthritis     "hands" (08/28/2014)  . Anxiety   . Chronic kidney disease (CKD), stage III (moderate)     [name] 08/28/2014  . Diabetes mellitus with neuropathy ([place])   . Bipolar disorder ([place])   . Aortic stenosis     a. Mod-severe by echo.  . Chronic venous insufficiency   . NSVT (nonsustained ventricular tachycardia) ([place])     a. 12/2015 in setting of low Mg.  . Multifocal atrial tachycardia ([place])   . Renal artery stenosis (H[facility])     a. s/p prior L renal artery stent.  Marland Kitchen RBBB   . Chronic diastolic  CHF (congestive heart failure) [facility])     Past Surgical History  Procedure Laterality Date  . Renal artery stent Left   . Kidney stone surgery  1989  . Lower extremity angiogram Left 03-20-2014    Dr. Tinnie Gens  . Tonsillectomy    . Cataract extraction, bilateral Bilateral   . Blepharoplasty Right   . Abdominal aortagram N/A 03/20/2013    Procedure: ABDOMINAL Maxcine Ham;  Surgeon: [name], MD;  Location: [facility] CATH LAB;  Service: Cardiovascular;  Laterality: N/A;  . Lower extremity angiogram Left 03/20/2013    Procedure: LOWER EXTREMITY ANGIOGRAM;  Surgeon: [name], MD;  Location: [facility] CATH LAB;  Service: Cardiovascular;  Laterality: Left;      Medication List       This list is accurate as of: 03/04/16 11:59 PM.  Always use your most recent med list.               acetaminophen 325 MG tablet  Commonly known as:  TYLENOL  Take 650 mg by mouth every 6 (six) hours as needed for mild pain, moderate pain, fever or headache.     aspirin EC 81 MG tablet  Take 81 mg by mouth daily.     bisacodyl 10 MG suppository  Commonly known as:  DULCOLAX  Place 1 suppository (10 mg total) rectally once.  carbidopa-levodopa 10-100 MG tablet  Commonly known as:  SINEMET IR  Take 1 tablet by mouth at bedtime.     collagenase ointment  Commonly known as:  SANTYL  Apply topically daily. Apply daily on right heel, as part of pressure ulcer care.     diltiazem 120 MG 24 hr capsule  Commonly known as:  DILACOR XR  Take 120 mg by mouth daily. Take with a 240 mg capsule for a 360 mg dose     diltiazem 240 MG 24 hr capsule  Commonly known as:  CARDIZEM CD  Take 240 mg by mouth daily. Take with a 120 mg capsule for a 360 mg dose     GLUCERNA Liqd  Take 237 mLs by mouth See admin instructions. Offer 1 can (237 mls) by mouth between breakfast and lunch     feeding supplement (ENSURE) Pudg  Take 1 Container by mouth 3 (three) times daily between meals.     fentaNYL 50 MCG/HR   Commonly known as:  DURAGESIC - dosed mcg/hr  Place 1 patch (50 mcg total) onto the skin every 3 (three) days. REMOVE OLD PATCH NOTE SITE EXTERNAL USE ONLY     Fluticasone-Salmeterol 250-50 MCG/DOSE Aepb  Commonly known as:  ADVAIR  Inhale 1 puff into the lungs 2 (two) times daily. Rinse mouth after inhalation and spit out     furosemide 40 MG tablet  Commonly known as:  LASIX  Take 1 tablet (40 mg total) by mouth 2 (two) times daily.     gabapentin 300 MG capsule  Commonly known as:  NEURONTIN  Take 1 capsule (300 mg total) by mouth 2 (two) to 3 (three)  times daily.     ipratropium 0.02 % nebulizer solution  Commonly known as:  ATROVENT  Take 0.5 mg by nebulization every 6 (six) hours as needed for shortness of breath.     ipratropium-albuterol 0.5-2.5 (3) MG/3ML Soln  Commonly known as:  DUONEB  Take 3 mLs by nebulization every 6 (six) hours as needed (shortness of breathe.).     levothyroxine 112 MCG tablet  Commonly known as:  SYNTHROID, LEVOTHROID  Take 112 mcg by mouth daily before breakfast.     linagliptin 5 MG Tabs tablet  Commonly known as:  TRADJENTA  Take 1 tablet (5 mg total) by mouth daily.     LORazepam 0.5 MG tablet  Commonly known as:  ATIVAN  Take 1 tablet (0.5 mg total) by mouth 3 (three) times daily. 8am, 12pm, 8pm     MELATIN PO  Take 1 tablet by mouth at bedtime.     MILK OF MAGNESIA PO  Take 30 mLs by mouth daily as needed (mild constipation).     multivitamin with minerals Tabs tablet  Take 1 tablet by mouth daily.     omeprazole 20 MG capsule  Commonly known as:  PRILOSEC  Take 20 mg by mouth daily before breakfast.     oxycodone 5 MG capsule  Commonly known as:  OXY-IR  Take 1 capsule (5 mg total) by mouth every 8 (eight) hours as needed for pain.     polyethylene glycol packet  Commonly known as:  MIRALAX / GLYCOLAX  Take 17 g by mouth daily.     potassium chloride SA 20 MEQ tablet  Commonly known as:  K-DUR,KLOR-CON  Take 1  tablet (20 mEq total) by mouth 2 (two) times daily.     QUEtiapine 25 MG tablet  Commonly known as:  SEROQUEL  Take 1 tablet (25 mg total) by mouth at bedtime.     sertraline 100 MG tablet  Commonly known as:  ZOLOFT  Take 100 mg by mouth at bedtime.     simvastatin 10 MG tablet  Commonly known as:  ZOCOR  Take 10 mg by mouth at bedtime.        No orders of the defined types were placed in this encounter.    Immunization History  Administered Date(s) Administered  . Influenza,inj,Quad PF,36+ Mos 08/30/2014    Social History  Substance Use Topics  . Smoking status: Former Smoker -- 1.00 packs/day for 25 years    Types: Cigarettes    Quit date: 08/17/2012  . Smokeless tobacco: Never Used  . Alcohol Use: 0.0 oz/week    0 Standard drinks or equivalent per week     Comment: 08/28/2014 "last drink was in ~ 2012"    Review of Systems  DATA OBTAINED: from [place]:  no fevers, fatigue, appetite changes SKIN: No itching, rash HEENT: No complaint RESPIRATORY: No cough, wheezing, SOB CARDIAC: No chest pain, palpitations, lower extremity edema  GI: No abdominal pain, No N/V/D or constipation, No heartburn or reflux  GU: No dysuria, frequency or urgency, or incontinence  MUSCULOSKELETAL: No unrelieved bone/joint pain NEUROLOGIC: No headache, dizziness  PSYCHIATRIC: No overt anxiety or sadness  Filed Vitals:   03/12/16 1738  BP: 130/65  Pulse: 84  Temp: 97.6 F (36.4 C)  Resp: 18    Physical Exam  GENERAL APPEARANCE: Alert, conversant, in good spirits today No acute distress  SKIN: No diaphoresis rash HEENT: L eye-no redness or swelling to lids or skin above or below eyes and lids; no nodules or TTP along lid rims; PERRL RESPIRATORY: Breathing is even, unlabored. Lung sounds are clear   CARDIOVASCULAR: Heart RRR no murmurs, rubs or gallops. No peripheral edema  GASTROINTESTINAL: Abdomen is soft, non-tender, not distended w/ normal bowel sounds.   GENITOURINARY: Bladder non tender, not distended  MUSCULOSKELETAL: No abnormal joints or musculature NEUROLOGIC: Cranial nerves 2-12 grossly intact. Moves all extremities PSYCHIATRIC: Mood and affect appropriate to situation, no behavioral issues  Patient Active Problem List   Diagnosis Date Noted  . Encounter for family conference with patient present 02/20/2016  . Pressure ulcer 02/10/2016  . Pain in the chest   . Chronic renal failure, stage 3 (moderate)   . Benign essential HTN   . Parkinson disease ([place])   . Anemia of chronic disease   . Pneumonia 02/06/2016  . Wounds, multiple open, lower extremity 01/23/2016  . Nonsustained ventricular tachycardia ([place]) 01/19/2016  . MRSA pneumonia ([place]) 01/19/2016  . Diabetic neuropathy ([place]) 01/18/2016  . Chronic venous insufficiency 01/18/2016  . Acute kidney injury ([place]) 01/17/2016  . Hypomagnesemia 01/16/2016  . Altered mental status 01/16/2016  . Stage III chronic kidney disease 01/16/2016  . HCAP (healthcare-associated pneumonia) 01/04/2016  . Hypotension 11/23/2015  . Candida rash of groin 09/23/2015  . Bipolar disorder ([place]) 07/14/2015  . Aortic stenosis 07/09/2015  . Chronic diastolic CHF (congestive heart failure) ([place]) 07/09/2015  . Sepsis due to pneumonia ([place]) 07/08/2015  . Acute respiratory failure with hypoxia ([place]) 07/02/2015  . Pulmonary edema 07/02/2015  . Leukocytosis 07/02/2015  . Type 2 diabetes mellitus with stage 3 chronic kidney disease ([place]) 07/02/2015  . Chronic osteomyelitis of right foot ([place]) 11/11/2014  . Beta-hemolytic group B streptococcal sepsis ([place]) 11/11/2014  . Bacteremia 11/11/2014  . Atherosclerotic PVD with ulceration ([place]) 10/28/2014  . Hyperlipidemia 10/07/2014  .  Obesity 10/07/2014  . Moderate aortic stenosis 10/07/2014  . Multifocal atrial tachycardia ([place]) 10/06/2014  . Sepsis ([place]) 10/05/2014  . Wound of right leg 08/28/2014  . Foot ulcer ([place]) 01/04/2013  . Cellulitis 01/04/2013   . PAD (peripheral artery disease) ([place]) 01/04/2013  . Bilateral lower extremity edema 10/23/2012  . Atherosclerosis of native arteries of the extremities with ulceration ([place]) 10/23/2012  . Aspiration pneumonia ([place]) 08/17/2012  . Toxic encephalopathy 08/17/2012  . COPD with acute exacerbation ([place]) 08/17/2012  . Sacral decubitus ulcer 08/17/2012  . Chronic pain 08/17/2012  . Anxiety disorder 08/17/2012  . Chronic kidney disease, stage III (moderate) 08/17/2012  . Hypothyroidism 02/21/2008  . DYSLIPIDEMIA 02/21/2008  . ANXIETY DEPRESSION 02/21/2008  . RHEUMATIC FEVER 02/21/2008  . Hypertensive heart disease with CHF ([place]) 02/21/2008  . COPD (chronic obstructive pulmonary disease) ([place]) 02/21/2008  . ESOPHAGEAL MOTILITY DISORDER 02/21/2008  . ESOPHAGEAL DIVERTICULUM 02/21/2008  . SLEEP APNEA 02/21/2008  . ATHEROSCLEROSIS 07/11/2007  . INGUINAL HERNIAS, BILATERAL 07/11/2007  . HERNIA, UMBILICAL [id number]  . CHOLELITHIASIS 07/11/2007  . UNSPECIFIED RENAL SCLEROSIS 07/11/2007  . TUBULOVILLOUS ADENOMA, COLON 11/02/2005  . GERD 11/02/2005  . DIVERTICULOSIS OF COLON 11/02/2005  . MELANOSIS COLI 11/02/2005    CBC    Component Value Date/Time   WBC 12.2* 02/10/2016 0648   RBC 3.64* 02/10/2016 0648   HGB 9.6* 02/10/2016 0648   HCT 30.0* 02/10/2016 0648   PLT 143* 02/10/2016 0648   MCV 82.4 02/10/2016 0648   LYMPHSABS 1.8 02/07/2016 0025   MONOABS 0.9 02/07/2016 0025   EOSABS 0.4 02/07/2016 0025   BASOSABS 0.0 02/07/2016 0025    CMP     Component Value Date/Time   NA 141 02/10/2016 0648   K 4.0 02/10/2016 0648   CL 97* 02/10/2016 0648   CO2 31 02/10/2016 0648   GLUCOSE 110* 02/10/2016 0648   BUN 20 02/10/2016 0648   CREATININE 1.70* 02/10/2016 0648   CALCIUM 8.3* 02/10/2016 0648   PROT 6.1* 02/07/2016 0025   ALBUMIN 2.5* 02/07/2016 0025   AST 20 02/07/2016 0025   ALT 10* 02/07/2016 0025   ALKPHOS 69 02/07/2016 0025   BILITOT 0.6 02/07/2016 0025   GFRNONAA 35*  02/10/2016 0648   GFRAA 41* 02/10/2016 0648    Assessment and Plan  CONJUNCTIVITIS- can't be anything else; garamycin eye drops QID for 7-10 days  Hennie Duos, MD

## 2016-03-13 ENCOUNTER — Encounter: Payer: Self-pay | Admitting: Internal Medicine

## 2016-03-13 NOTE — Progress Notes (Signed)
MRN: [id number] Name: [name]  Sex: male Age: 82 y.o. DOB: [birth year]  [place] [facility]: [name] Facility/Room: Level Of Care: SNF Provider: Inocencio Homes D Emergency Contacts: Extended Emergency Contact Information Primary Emergency Contact: [name],[name] Address: [place]          Raelene Bott of [place] Phone: [phone] Mobile Phone: [phone] Relation: Spouse Secondary Emergency Contact: [name],[name]  [place] Mobile Phone: [phone] Relation: Son  Code Status:   Allergies: Review of patient's allergies indicates no known allergies.  Chief Complaint  Patient presents with  . Acute Visit    HPI: Patient is 82 y.o. male who nursing has asked me to see for pain in his R groin. Reported it hurts to touch. No pain with movement. No fever or other systemic sx.  Past Medical History  Diagnosis Date  . Hyperlipidemia   . COPD (chronic obstructive pulmonary disease) ([place])   . Leg pain   . GERD (gastroesophageal reflux disease)   . Depression   . Hypothyroidism   . Peripheral vascular disease ([place])     a. noninvasive studies suggesting mod-severe arterial insufficiency confirmed by prior PV angiograms, followed by Dr. Donnetta Hutching).  . ED (erectile dysfunction)   . Hypertension     08/17/12 - wife denies  . H/O hiatal hernia   . Migraine     "when I was a young boy"  . Arthritis     "hands" (08/28/2014)  . Anxiety   . Chronic kidney disease (CKD), stage III (moderate)     [name] 08/28/2014  . Diabetes mellitus with neuropathy ([place])   . Bipolar disorder ([place])   . Aortic stenosis     a. Mod-severe by echo.  . Chronic venous insufficiency   . NSVT (nonsustained ventricular tachycardia) ([place])     a. 12/2015 in setting of low Mg.  . Multifocal atrial tachycardia ([place])   . Renal artery stenosis (HCC)     a. s/p prior L renal artery stent.  Marland Kitchen RBBB   . Chronic diastolic CHF (congestive heart failure) [facility])     Past  Surgical History  Procedure Laterality Date  . Renal artery stent Left   . Kidney stone surgery  1989  . Lower extremity angiogram Left 03-20-2014    Dr. Tinnie Gens  . Tonsillectomy    . Cataract extraction, bilateral Bilateral   . Blepharoplasty Right   . Abdominal aortagram N/A 03/20/2013    Procedure: ABDOMINAL Maxcine Ham;  Surgeon: [name], MD;  Location: [facility] CATH LAB;  Service: Cardiovascular;  Laterality: N/A;  . Lower extremity angiogram Left 03/20/2013    Procedure: LOWER EXTREMITY ANGIOGRAM;  Surgeon: [name], MD;  Location: [facility] CATH LAB;  Service: Cardiovascular;  Laterality: Left;      Medication List       This list is accurate as of: 02/24/16 11:59 PM.  Always use your most recent med list.               acetaminophen 325 MG tablet  Commonly known as:  TYLENOL  Take 650 mg by mouth every 6 (six) hours as needed for mild pain, moderate pain, fever or headache.     aspirin EC 81 MG tablet  Take 81 mg by mouth daily.     bisacodyl 10 MG suppository  Commonly known as:  DULCOLAX  Place 1 suppository (10 mg total) rectally once.     carbidopa-levodopa 10-100 MG tablet  Commonly known as:  SINEMET IR  Take 1 tablet by mouth at bedtime.     collagenase ointment  Commonly known as:  SANTYL  Apply topically daily. Apply daily on right heel, as part of pressure ulcer care.     diltiazem 120 MG 24 hr capsule  Commonly known as:  DILACOR XR  Take 120 mg by mouth daily. Take with a 240 mg capsule for a 360 mg dose     diltiazem 240 MG 24 hr capsule  Commonly known as:  CARDIZEM CD  Take 240 mg by mouth daily. Take with a 120 mg capsule for a 360 mg dose     GLUCERNA Liqd  Take 237 mLs by mouth See admin instructions. Offer 1 can (237 mls) by mouth between breakfast and lunch     feeding supplement (ENSURE) Pudg  Take 1 Container by mouth 3 (three) times daily between meals.     fentaNYL 50 MCG/HR  Commonly known as:  DURAGESIC - dosed mcg/hr  Place 1  patch (50 mcg total) onto the skin every 3 (three) days. REMOVE OLD PATCH NOTE SITE EXTERNAL USE ONLY     Fluticasone-Salmeterol 250-50 MCG/DOSE Aepb  Commonly known as:  ADVAIR  Inhale 1 puff into the lungs 2 (two) times daily. Rinse mouth after inhalation and spit out     furosemide 40 MG tablet  Commonly known as:  LASIX  Take 1 tablet (40 mg total) by mouth 2 (two) times daily.     gabapentin 300 MG capsule  Commonly known as:  NEURONTIN  Take 1 capsule (300 mg total) by mouth 2 (two) to 3 (three)  times daily.     ipratropium 0.02 % nebulizer solution  Commonly known as:  ATROVENT  Take 0.5 mg by nebulization every 6 (six) hours as needed for shortness of breath.     ipratropium-albuterol 0.5-2.5 (3) MG/3ML Soln  Commonly known as:  DUONEB  Take 3 mLs by nebulization every 6 (six) hours as needed (shortness of breathe.).     levothyroxine 112 MCG tablet  Commonly known as:  SYNTHROID, LEVOTHROID  Take 112 mcg by mouth daily before breakfast.     linagliptin 5 MG Tabs tablet  Commonly known as:  TRADJENTA  Take 1 tablet (5 mg total) by mouth daily.     LORazepam 0.5 MG tablet  Commonly known as:  ATIVAN  Take 1 tablet (0.5 mg total) by mouth 3 (three) times daily. 8am, 12pm, 8pm     MELATIN PO  Take 1 tablet by mouth at bedtime.     MILK OF MAGNESIA PO  Take 30 mLs by mouth daily as needed (mild constipation).     multivitamin with minerals Tabs tablet  Take 1 tablet by mouth daily.     omeprazole 20 MG capsule  Commonly known as:  PRILOSEC  Take 20 mg by mouth daily before breakfast.     oxycodone 5 MG capsule  Commonly known as:  OXY-IR  Take 1 capsule (5 mg total) by mouth every 8 (eight) hours as needed for pain.     polyethylene glycol packet  Commonly known as:  MIRALAX / GLYCOLAX  Take 17 g by mouth daily.     potassium chloride SA 20 MEQ tablet  Commonly known as:  K-DUR,KLOR-CON  Take 1 tablet (20 mEq total) by mouth 2 (two) times daily.      QUEtiapine 25 MG tablet  Commonly known as:  SEROQUEL  Take 1 tablet (25 mg total) by mouth  at bedtime.     sertraline 100 MG tablet  Commonly known as:  ZOLOFT  Take 100 mg by mouth at bedtime.     simvastatin 10 MG tablet  Commonly known as:  ZOCOR  Take 10 mg by mouth at bedtime.        No orders of the defined types were placed in this encounter.    Immunization History  Administered Date(s) Administered  . Influenza,inj,Quad PF,36+ Mos 08/30/2014    Social History  Substance Use Topics  . Smoking status: Former Smoker -- 1.00 packs/day for 25 years    Types: Cigarettes    Quit date: 08/17/2012  . Smokeless tobacco: Never Used  . Alcohol Use: 0.0 oz/week    0 Standard drinks or equivalent per week     Comment: 08/28/2014 "last drink was in ~ 2012"    Review of Systems  Limited from pt 2/2 dementia; Nursing - as in HPI    Filed Vitals:   03/13/16 1600  BP: 118/69  Pulse: 77  Temp: 98 F (36.7 C)  Resp: 17    Physical Exam  GENERAL APPEARANCE: Alert, conversant, No acute distress  SKIN: No diaphoresis rash HEENT: Unremarkable RESPIRATORY: Breathing is even, unlabored. Lung sounds are clear   CARDIOVASCULAR: Heart RRR no murmurs, rubs or gallops. No peripheral edema  GASTROINTESTINAL: Abdomen is soft, non-tender, not distended w/ normal bowel sounds.  GENITOURINARY: Bladder non tender, not distended R groin- entire area and in all directions visualized - mild yeast changes to are; palpation with no nodes, or soft tissue tenderness; pt does not appear in any distress with palpation  MUSCULOSKELETAL: No abnormal joints or musculature NEUROLOGIC: Cranial nerves 2-12 grossly intact. Moves all extremities PSYCHIATRIC: Mood and affect appropriate to situation wit dementia, no behavioral issues  Patient Active Problem List   Diagnosis Date Noted  . Encounter for family conference with patient present 02/20/2016  . Pressure ulcer 02/10/2016  . Pain in the chest    . Chronic renal failure, stage 3 (moderate)   . Benign essential HTN   . Parkinson disease ([place])   . Anemia of chronic disease   . Pneumonia 02/06/2016  . Wounds, multiple open, lower extremity 01/23/2016  . Nonsustained ventricular tachycardia ([place]) 01/19/2016  . MRSA pneumonia ([place]) 01/19/2016  . Diabetic neuropathy ([place]) 01/18/2016  . Chronic venous insufficiency 01/18/2016  . Acute kidney injury ([place]) 01/17/2016  . Hypomagnesemia 01/16/2016  . Altered mental status 01/16/2016  . Stage III chronic kidney disease 01/16/2016  . HCAP (healthcare-associated pneumonia) 01/04/2016  . Hypotension 11/23/2015  . Candida rash of groin 09/23/2015  . Bipolar disorder ([place]) 07/14/2015  . Aortic stenosis 07/09/2015  . Chronic diastolic CHF (congestive heart failure) ([place]) 07/09/2015  . Sepsis due to pneumonia ([place]) 07/08/2015  . Acute respiratory failure with hypoxia ([place]) 07/02/2015  . Pulmonary edema 07/02/2015  . Leukocytosis 07/02/2015  . Type 2 diabetes mellitus with stage 3 chronic kidney disease ([place]) 07/02/2015  . Chronic osteomyelitis of right foot ([place]) 11/11/2014  . Beta-hemolytic group B streptococcal sepsis ([place]) 11/11/2014  . Bacteremia 11/11/2014  . Atherosclerotic PVD with ulceration ([place]) 10/28/2014  . Hyperlipidemia 10/07/2014  . Obesity 10/07/2014  . Moderate aortic stenosis 10/07/2014  . Multifocal atrial tachycardia ([place]) 10/06/2014  . Sepsis ([place]) 10/05/2014  . Wound of right leg 08/28/2014  . Foot ulcer ([place]) 01/04/2013  . Cellulitis 01/04/2013  . PAD (peripheral artery disease) ([place]) 01/04/2013  . Bilateral lower extremity edema 10/23/2012  . Atherosclerosis of  native arteries of the extremities with ulceration ([place]) 10/23/2012  . Aspiration pneumonia ([place]) 08/17/2012  . Toxic encephalopathy 08/17/2012  . COPD with acute exacerbation ([place]) 08/17/2012  . Sacral decubitus ulcer 08/17/2012  . Chronic pain 08/17/2012  . Anxiety disorder 08/17/2012  . Chronic  kidney disease, stage III (moderate) 08/17/2012  . Hypothyroidism 02/21/2008  . DYSLIPIDEMIA 02/21/2008  . ANXIETY DEPRESSION 02/21/2008  . RHEUMATIC FEVER 02/21/2008  . Hypertensive heart disease with CHF ([place]) 02/21/2008  . COPD (chronic obstructive pulmonary disease) ([place]) 02/21/2008  . ESOPHAGEAL MOTILITY DISORDER 02/21/2008  . ESOPHAGEAL DIVERTICULUM 02/21/2008  . SLEEP APNEA 02/21/2008  . ATHEROSCLEROSIS 07/11/2007  . INGUINAL HERNIAS, BILATERAL 07/11/2007  . HERNIA, UMBILICAL [id number]  . CHOLELITHIASIS 07/11/2007  . UNSPECIFIED RENAL SCLEROSIS 07/11/2007  . TUBULOVILLOUS ADENOMA, COLON 11/02/2005  . GERD 11/02/2005  . DIVERTICULOSIS OF COLON 11/02/2005  . MELANOSIS COLI 11/02/2005    CBC    Component Value Date/Time   WBC 12.2* 02/10/2016 0648   RBC 3.64* 02/10/2016 0648   HGB 9.6* 02/10/2016 0648   HCT 30.0* 02/10/2016 0648   PLT 143* 02/10/2016 0648   MCV 82.4 02/10/2016 0648   LYMPHSABS 1.8 02/07/2016 0025   MONOABS 0.9 02/07/2016 0025   EOSABS 0.4 02/07/2016 0025   BASOSABS 0.0 02/07/2016 0025    CMP     Component Value Date/Time   NA 141 02/10/2016 0648   K 4.0 02/10/2016 0648   CL 97* 02/10/2016 0648   CO2 31 02/10/2016 0648   GLUCOSE 110* 02/10/2016 0648   BUN 20 02/10/2016 0648   CREATININE 1.70* 02/10/2016 0648   CALCIUM 8.3* 02/10/2016 0648   PROT 6.1* 02/07/2016 0025   ALBUMIN 2.5* 02/07/2016 0025   AST 20 02/07/2016 0025   ALT 10* 02/07/2016 0025   ALKPHOS 69 02/07/2016 0025   BILITOT 0.6 02/07/2016 0025   GFRNONAA 35* 02/10/2016 0648   GFRAA 41* 02/10/2016 0648    Assessment and Plan  C/o GROIN PAIN - no nodes, nodules, signs of infection; there are some changes c/w CANDIDA OF GROIN- start nystatin powder or cream (pt preference ) BID for 7-10 days   Time spent > 25 min;> 50% of time with patient was spent reviewing records, labs, tests and studies, counseling and developing plan of care  Hennie Duos, MD

## 2016-03-21 ENCOUNTER — Encounter: Payer: Self-pay | Admitting: Vascular Surgery

## 2016-03-22 ENCOUNTER — Encounter: Payer: Self-pay | Admitting: Vascular Surgery

## 2016-03-22 ENCOUNTER — Ambulatory Visit (INDEPENDENT_AMBULATORY_CARE_PROVIDER_SITE_OTHER): Payer: Medicare Other | Admitting: Vascular Surgery

## 2016-03-22 VITALS — BP 124/76 | HR 99 | Temp 97.9°F | Ht 73.0 in | Wt 170.0 lb

## 2016-03-22 DIAGNOSIS — I70244 Atherosclerosis of native arteries of left leg with ulceration of heel and midfoot: Secondary | ICD-10-CM | POA: Diagnosis not present

## 2016-03-22 DIAGNOSIS — I70234 Atherosclerosis of native arteries of right leg with ulceration of heel and midfoot: Secondary | ICD-10-CM

## 2016-03-22 NOTE — Progress Notes (Signed)
Vascular and Vein Specialist of [facility]  Patient name: [name] MRN: [id number] DOB: 01/26/1929 Sex: male  REASON FOR VISIT: Patient well-known to me from long-standing evaluation of arterial and venous insufficiency.  HPI: [name] is a 82 y.o. male he is currently in a nursing home. He is a quite alert and oriented and is here today with his wife. He is unhappy with the care at his nursing facility and wants to go home. Feel that this would be very difficult for his wife to manage him at home. He is able to stand and transfer from bed to chair and can take a few steps. Is not ambulatory otherwise. He does report pain in both lower extremities and asking for more potent pain medication.  Past Medical History  Diagnosis Date  . Hyperlipidemia   . COPD (chronic obstructive pulmonary disease) ([place])   . Leg pain   . GERD (gastroesophageal reflux disease)   . Depression   . Hypothyroidism   . Peripheral vascular disease ([place])     a. noninvasive studies suggesting mod-severe arterial insufficiency confirmed by prior PV angiograms, followed by Dr. Donnetta Hutching).  . ED (erectile dysfunction)   . Hypertension     08/17/12 - wife denies  . H/O hiatal hernia   . Migraine     "when I was a young boy"  . Arthritis     "hands" (08/28/2014)  . Anxiety   . Chronic kidney disease (CKD), stage III (moderate)     [name] 08/28/2014  . Diabetes mellitus with neuropathy ([place])   . Bipolar disorder ([place])   . Aortic stenosis     a. Mod-severe by echo.  . Chronic venous insufficiency   . NSVT (nonsustained ventricular tachycardia) ([place])     a. 12/2015 in setting of low Mg.  . Multifocal atrial tachycardia ([place])   . Renal artery stenosis (HCC)     a. s/p prior L renal artery stent.  Marland Kitchen RBBB   . Chronic diastolic CHF (congestive heart failure) (HCC)     Family History  Problem Relation Age of Onset  . Other Mother     AAA  . AAA (abdominal aortic aneurysm) Mother   . Heart attack  Father   . Alcohol abuse Father   . Diabetes Father   . Other Father     amputation  . Diabetes Brother   . Coronary artery disease Brother   . Dementia Brother     SOCIAL HISTORY: Social History  Substance Use Topics  . Smoking status: Former Smoker -- 1.00 packs/day for 25 years    Types: Cigarettes    Quit date: 08/17/2012  . Smokeless tobacco: Never Used  . Alcohol Use: 0.0 oz/week    0 Standard drinks or equivalent per week     Comment: 08/28/2014 "last drink was in ~ 2012"    No Known Allergies  Current Outpatient Prescriptions  Medication Sig Dispense Refill  . acetaminophen (TYLENOL) 325 MG tablet Take 650 mg by mouth every 6 (six) hours as needed for mild pain, moderate pain, fever or headache.     Marland Kitchen aspirin EC 81 MG tablet Take 81 mg by mouth daily.     . bisacodyl (DULCOLAX) 10 MG suppository Place 1 suppository (10 mg total) rectally once. (Patient taking differently: Place 10 mg rectally daily as needed for moderate constipation. ) 12 suppository 0  . carbidopa-levodopa (SINEMET) 10-100 MG per tablet Take 1 tablet by mouth at bedtime.     Marland Kitchen  collagenase (SANTYL) ointment Apply topically daily. Apply daily on right heel, as part of pressure ulcer care.    . diltiazem (CARDIZEM CD) 240 MG 24 hr capsule Take 240 mg by mouth daily. Take with a 120 mg capsule for a 360 mg dose    . diltiazem (DILACOR XR) 120 MG 24 hr capsule Take 120 mg by mouth daily. Take with a 240 mg capsule for a 360 mg dose    . feeding supplement (ENSURE) PUDG Take 1 Container by mouth 3 (three) times daily between meals. (Patient taking differently: Take 1 Container by mouth See admin instructions. Offer as afternoon and bedtime snack)    . fentaNYL (DURAGESIC - DOSED MCG/HR) 50 MCG/HR Place 1 patch (50 mcg total) onto the skin every 3 (three) days. REMOVE OLD PATCH NOTE SITE EXTERNAL USE ONLY 10 patch 0  . Fluticasone-Salmeterol (ADVAIR) 250-50 MCG/DOSE AEPB Inhale 1 puff into the lungs 2 (two) times  daily. Rinse mouth after inhalation and spit out    . furosemide (LASIX) 40 MG tablet Take 1 tablet (40 mg total) by mouth 2 (two) times daily. (Patient taking differently: Take 40 mg by mouth 2 (two) times daily. 6am and 4:30pm) 30 tablet 0  . gabapentin (NEURONTIN) 300 MG capsule Take 1 capsule (300 mg total) by mouth 2 (two) to 3 (three)  times daily. (Patient taking differently: Take 300 mg by mouth 3 (three) times daily. 10am, 4pm, 10pm)    . GLUCERNA (GLUCERNA) LIQD Take 237 mLs by mouth See admin instructions. Offer 1 can (237 mls) by mouth between breakfast and lunch    . ipratropium (ATROVENT) 0.02 % nebulizer solution Take 0.5 mg by nebulization every 6 (six) hours as needed for shortness of breath.     Marland Kitchen ipratropium-albuterol (DUONEB) 0.5-2.5 (3) MG/3ML SOLN Take 3 mLs by nebulization every 6 (six) hours as needed (shortness of breathe.).    Marland Kitchen levothyroxine (SYNTHROID, LEVOTHROID) 112 MCG tablet Take 112 mcg by mouth daily before breakfast.     . linagliptin (TRADJENTA) 5 MG TABS tablet Take 1 tablet (5 mg total) by mouth daily. 30 tablet 2  . LORazepam (ATIVAN) 0.5 MG tablet Take 1 tablet (0.5 mg total) by mouth 3 (three) times daily. 8am, 12pm, 8pm 15 tablet 0  . Magnesium Hydroxide (MILK OF MAGNESIA PO) Take 30 mLs by mouth daily as needed (mild constipation).    . Melatonin-Pyridoxine (MELATIN PO) Take 1 tablet by mouth at bedtime.     . Multiple Vitamin (MULTIVITAMIN WITH MINERALS) TABS tablet Take 1 tablet by mouth daily.    Marland Kitchen omeprazole (PRILOSEC) 20 MG capsule Take 20 mg by mouth daily before breakfast.     . oxycodone (OXY-IR) 5 MG capsule Take 1 capsule (5 mg total) by mouth every 8 (eight) hours as needed for pain. 15 capsule 0  . polyethylene glycol (MIRALAX / GLYCOLAX) packet Take 17 g by mouth daily. (Patient taking differently: Take 17 g by mouth daily. Mix in 4 oz of water and drink) 14 each 0  . potassium chloride SA (K-DUR,KLOR-CON) 20 MEQ tablet Take 1 tablet (20 mEq  total) by mouth 2 (two) times daily. 60 tablet 0  . QUEtiapine (SEROQUEL) 25 MG tablet Take 1 tablet (25 mg total) by mouth at bedtime.    . sertraline (ZOLOFT) 100 MG tablet Take 100 mg by mouth at bedtime.     . simvastatin (ZOCOR) 10 MG tablet Take 10 mg by mouth at bedtime.  No current facility-administered medications for this visit.    REVIEW OF SYSTEMS:  [X]  denotes positive finding, [ ]  denotes negative finding Cardiac  Comments:  Chest pain or chest pressure:    Shortness of breath upon exertion:    Short of breath when lying flat:    Irregular heart rhythm:        Vascular    Pain in calf, thigh, or hip brought on by ambulation:    Pain in feet at night that wakes you up from your sleep:     Blood clot in your veins:    Leg swelling:         Pulmonary    Oxygen at home:    Productive cough:     Wheezing:         Neurologic    Sudden weakness in arms or legs:     Sudden numbness in arms or legs:     Sudden onset of difficulty speaking or slurred speech:    Temporary loss of vision in one eye:     Problems with dizziness:         Gastrointestinal    Blood in stool:     Vomited blood:         Genitourinary    Burning when urinating:     Blood in urine:        Psychiatric    Major depression:         Hematologic    Bleeding problems:    Problems with blood clotting too easily:        Skin    Rashes or ulcers:        Constitutional    Fever or chills:      PHYSICAL EXAM: Filed Vitals:   03/22/16 1443  BP: 124/76  Pulse: 99  Temp: 97.9 F (36.6 C)  TempSrc: Oral  Height: 6\' 1"  (1.854 m)  Weight: 170 lb (77.111 kg)  SpO2: 100%    GENERAL: The patient is a well-nourished male, in no acute distress. The vital signs are documented above. VASCULAR: No palpable pedal pulses bilaterally PULMONARY: There is good air exchange  MUSCULOSKELETAL: Marked pitting edema from his knees distally into his foot bilaterally NEUROLOGIC: No focal weakness or  paresthesias are detected. SKIN: Superficial excoriation circumferentially over both legs from his midcalf distally. He does have large heel ulcers bilaterally much greater on the left than on the right. No purulence and no surrounding erythema PSYCHIATRIC: The patient has a normal affect.  DATA:  Prior arteriograms 2014 2015 revealing superficial femoral artery occlusions bilaterally  MEDICAL ISSUES: Had a long discussion with the patient and his wife present. Explained that he is actually been fortunate in maintaining his chronic wounds for 2 years. Does not have any treatment option regarding revascularization. Certainly would not tolerate bypass. He has had improvement in his swelling when he is been hospitalized on several occasions recently for pneumonia. Sits with his leg dependent throughout most the day and therefore has severe swelling. Exam explain the importance of elevation and compression. Feel that he does not have any option for revascularization and will not have any possibility of healing his heel ulcers. Explained that he is at high risk for proceeding to amputation. Explained that the intolerable pain or progressive tissue loss would result in amputation. He will continue his local wound care at the nursing facility and see [place] as needed No Follow-up on file.   Curt Jews V[facility] Vein Specialists of  [facility]: [phone]

## 2016-03-31 ENCOUNTER — Non-Acute Institutional Stay (SKILLED_NURSING_FACILITY): Payer: Medicare Other | Admitting: Internal Medicine

## 2016-03-31 ENCOUNTER — Encounter: Payer: Self-pay | Admitting: Internal Medicine

## 2016-03-31 DIAGNOSIS — R531 Weakness: Secondary | ICD-10-CM

## 2016-03-31 DIAGNOSIS — R0789 Other chest pain: Secondary | ICD-10-CM

## 2016-03-31 DIAGNOSIS — I4729 Other ventricular tachycardia: Secondary | ICD-10-CM

## 2016-03-31 DIAGNOSIS — I739 Peripheral vascular disease, unspecified: Secondary | ICD-10-CM

## 2016-03-31 DIAGNOSIS — I9589 Other hypotension: Secondary | ICD-10-CM | POA: Diagnosis not present

## 2016-03-31 DIAGNOSIS — N183 Chronic kidney disease, stage 3 unspecified: Secondary | ICD-10-CM

## 2016-03-31 DIAGNOSIS — I5032 Chronic diastolic (congestive) heart failure: Secondary | ICD-10-CM

## 2016-03-31 DIAGNOSIS — J189 Pneumonia, unspecified organism: Secondary | ICD-10-CM

## 2016-03-31 DIAGNOSIS — I472 Ventricular tachycardia: Secondary | ICD-10-CM

## 2016-03-31 NOTE — Progress Notes (Signed)
Patient ID: [name], male   DOB: 12/13/1929, 82 y.o.   MRN: [id number] [id number]: [id number] Name: [name]  Sex: male Age: 82 y.o. DOB: 1929-02-21  [place] [facility]: [name] Facility/Room:217 Level Of Care: SNF Provider: Wille Celeste Emergency Contacts: Extended Emergency Contact Information Primary Emergency Contact: [name],Bruce Address: [address]          [place] Phone: [phone] Mobile Phone: [phone] Relation: Spouse Secondary Emergency Contact: [name],Bruce  [place] Mobile Phone: [phone] Relation: Son  Code Status:   Allergies: Review of patient's allergies indicates no known allergies.  Chief Complaint  Patient presents with  . Acute Visit    HPI: Patient is 82 y.o. male who nursing asked me to see for redness under his L eye for 2 days. He told her that it hurt. Denies discharge. No fever, cold or cough or allergy symtoms. Pt enies any itching to area.  Past Medical History  Diagnosis Date  . Hyperlipidemia   . COPD (chronic obstructive pulmonary disease) ([place])   . Leg pain   . GERD (gastroesophageal reflux disease)   . Depression   . Hypothyroidism   . Peripheral vascular disease ([place])     a. noninvasive studies suggesting mod-severe arterial insufficiency confirmed by prior PV angiograms, followed by Dr. Donnetta Hutching).  . ED (erectile dysfunction)   . Hypertension     08/17/12 - wife denies  . H/O hiatal hernia   . Migraine     "when I was a young boy"  . Arthritis     "hands" (08/28/2014)  . Anxiety   . Chronic kidney disease (CKD), stage III (moderate)     [name] 08/28/2014  . Diabetes mellitus with neuropathy ([place])   . Bipolar disorder ([place])   . Aortic stenosis     a. Mod-severe by echo.  . Chronic venous insufficiency   . NSVT (nonsustained ventricular tachycardia) ([place])     a. 12/2015 in setting of low Mg.  . Multifocal atrial tachycardia ([place])   . Renal artery stenosis  (HCC)     a. s/p prior L renal artery stent.  Marland Kitchen RBBB   . Chronic diastolic CHF (congestive heart failure) [facility])     Past Surgical History  Procedure Laterality Date  . Renal artery stent Left   . Kidney stone surgery  1989  . Lower extremity angiogram Left 03-20-2014    Dr. Tinnie Gens  . Tonsillectomy    . Cataract extraction, bilateral Bilateral   . Blepharoplasty Right   . Abdominal aortagram N/A 03/20/2013    Procedure: ABDOMINAL Maxcine Ham;  Surgeon: [name], MD;  Location: [facility] CATH LAB;  Service: Cardiovascular;  Laterality: N/A;  . Lower extremity angiogram Left 03/20/2013    Procedure: LOWER EXTREMITY ANGIOGRAM;  Surgeon: [name], MD;  Location: [facility] CATH LAB;  Service: Cardiovascular;  Laterality: Left;      Medication List       This list is accurate as of: 03/31/16 11:59 PM.  Always use your most recent med list.               acetaminophen 325 MG tablet  Commonly known as:  TYLENOL  Take 650 mg by mouth every 6 (six) hours as needed for mild pain, moderate pain, fever or headache.     aspirin EC 81 MG tablet  Take 81 mg by mouth daily.     bisacodyl 10 MG suppository  Commonly known  as:  DULCOLAX  Place 1 suppository (10 mg total) rectally once.     carbidopa-levodopa 10-100 MG tablet  Commonly known as:  SINEMET IR  Take 1 tablet by mouth at bedtime.     collagenase ointment  Commonly known as:  SANTYL  Apply topically daily. Apply daily on right heel, as part of pressure ulcer care.     diltiazem 120 MG 24 hr capsule  Commonly known as:  DILACOR XR  Take 120 mg by mouth daily. Take with a 240 mg capsule for a 360 mg dose     diltiazem 240 MG 24 hr capsule  Commonly known as:  CARDIZEM CD  Take 240 mg by mouth daily. Take with a 120 mg capsule for a 360 mg dose     GLUCERNA Liqd  Take 237 mLs by mouth See admin instructions. Offer 1 can (237 mls) by mouth between breakfast and lunch     feeding supplement (ENSURE) Pudg  Take 1 Container  by mouth 3 (three) times daily between meals.     fentaNYL 50 MCG/HR  Commonly known as:  DURAGESIC - dosed mcg/hr  Place 1 patch (50 mcg total) onto the skin every 3 (three) days. REMOVE OLD PATCH NOTE SITE EXTERNAL USE ONLY     Fluticasone-Salmeterol 250-50 MCG/DOSE Aepb  Commonly known as:  ADVAIR  Inhale 1 puff into the lungs 2 (two) times daily. Rinse mouth after inhalation and spit out     furosemide 40 MG tablet  Commonly known as:  LASIX  Take 1 tablet (40 mg total) by mouth 2 (two) times daily.     gabapentin 300 MG capsule  Commonly known as:  NEURONTIN  Take 1 capsule (300 mg total) by mouth 2 (two) to 3 (three)  times daily.     ipratropium 0.02 % nebulizer solution  Commonly known as:  ATROVENT  Take 0.5 mg by nebulization every 6 (six) hours as needed for shortness of breath.     ipratropium-albuterol 0.5-2.5 (3) MG/3ML Soln  Commonly known as:  DUONEB  Take 3 mLs by nebulization every 6 (six) hours as needed (shortness of breathe.).     levothyroxine 112 MCG tablet  Commonly known as:  SYNTHROID, LEVOTHROID  Take 112 mcg by mouth daily before breakfast.     linagliptin 5 MG Tabs tablet  Commonly known as:  TRADJENTA  Take 1 tablet (5 mg total) by mouth daily.     LORazepam 0.5 MG tablet  Commonly known as:  ATIVAN  Take 1 tablet (0.5 mg total) by mouth 3 (three) times daily. 8am, 12pm, 8pm     MELATIN PO  Take 1 tablet by mouth at bedtime.     MILK OF MAGNESIA PO  Take 30 mLs by mouth daily as needed (mild constipation).     multivitamin with minerals Tabs tablet  Take 1 tablet by mouth daily.     omeprazole 20 MG capsule  Commonly known as:  PRILOSEC  Take 20 mg by mouth daily before breakfast.     oxycodone 5 MG capsule  Commonly known as:  OXY-IR  Take 1 capsule (5 mg total) by mouth every 8 (eight) hours as needed for pain.     polyethylene glycol packet  Commonly known as:  MIRALAX / GLYCOLAX  Take 17 g by mouth daily.     potassium  chloride SA 20 MEQ tablet  Commonly known as:  K-DUR,KLOR-CON  Take 1 tablet (20 mEq total) by mouth 2 (two)  times daily.     QUEtiapine 25 MG tablet  Commonly known as:  SEROQUEL  Take 1 tablet (25 mg total) by mouth at bedtime.     sertraline 100 MG tablet  Commonly known as:  ZOLOFT  Take 100 mg by mouth at bedtime.     simvastatin 10 MG tablet  Commonly known as:  ZOCOR  Take 10 mg by mouth at bedtime.        No orders of the defined types were placed in this encounter.    Immunization History  Administered Date(s) Administered  . Influenza,inj,Quad PF,36+ Mos 08/30/2014    Social History  Substance Use Topics  . Smoking status: Former Smoker -- 1.00 packs/day for 25 years    Types: Cigarettes    Quit date: 08/17/2012  . Smokeless tobacco: Never Used  . Alcohol Use: 0.0 oz/week    0 Standard drinks or equivalent per week     Comment: 08/28/2014 "last drink was in ~ 2012"    Review of Systems  DATA OBTAINED: from [place]:  no fevers, fatigue, appetite changes SKIN: No itching, rash HEENT: No complaint RESPIRATORY: No cough, wheezing, SOB CARDIAC: No chest pain, palpitations, lower extremity edema  GI: No abdominal pain, No N/V/D or constipation, No heartburn or reflux  GU: No dysuria, frequency or urgency, or incontinence  MUSCULOSKELETAL: No unrelieved bone/joint pain NEUROLOGIC: No headache, dizziness  PSYCHIATRIC: No overt anxiety or sadness  Filed Vitals:   03/31/16 1629  BP: 90/60  Pulse: 80  Resp: 16    Physical Exam  GENERAL APPEARANCE: Alert, conversant, in good spirits today No acute distress  SKIN: No diaphoresis rash HEENT: L eye-no redness or swelling to lids or skin above or below eyes and lids; no nodules or TTP along lid rims; PERRL RESPIRATORY: Breathing is even, unlabored. Lung sounds are clear   CARDIOVASCULAR: Heart RRR no murmurs, rubs or gallops. No peripheral edema  GASTROINTESTINAL: Abdomen is soft, non-tender,  not distended w/ normal bowel sounds.  GENITOURINARY: Bladder non tender, not distended  MUSCULOSKELETAL: No abnormal joints or musculature NEUROLOGIC: Cranial nerves 2-12 grossly intact. Moves all extremities PSYCHIATRIC: Mood and affect appropriate to situation, no behavioral issues  Patient Active Problem List   Diagnosis Date Noted  . Encounter for family conference with patient present 02/20/2016  . Pressure ulcer 02/10/2016  . Pain in the chest   . Chronic renal failure, stage 3 (moderate)   . Benign essential HTN   . Parkinson disease ([place])   . Anemia of chronic disease   . Pneumonia 02/06/2016  . Wounds, multiple open, lower extremity 01/23/2016  . Nonsustained ventricular tachycardia ([place]) 01/19/2016  . MRSA pneumonia ([place]) 01/19/2016  . Diabetic neuropathy ([place]) 01/18/2016  . Chronic venous insufficiency 01/18/2016  . Acute kidney injury ([place]) 01/17/2016  . Hypomagnesemia 01/16/2016  . Altered mental status 01/16/2016  . Stage III chronic kidney disease 01/16/2016  . HCAP (healthcare-associated pneumonia) 01/04/2016  . Hypotension 11/23/2015  . Candida rash of groin 09/23/2015  . Bipolar disorder ([place]) 07/14/2015  . Aortic stenosis 07/09/2015  . Chronic diastolic CHF (congestive heart failure) ([place]) 07/09/2015  . Sepsis due to pneumonia ([place]) 07/08/2015  . Acute respiratory failure with hypoxia ([place]) 07/02/2015  . Pulmonary edema 07/02/2015  . Leukocytosis 07/02/2015  . Type 2 diabetes mellitus with stage 3 chronic kidney disease ([place]) 07/02/2015  . Chronic osteomyelitis of right foot ([place]) 11/11/2014  . Beta-hemolytic group B streptococcal sepsis ([place]) 11/11/2014  . Bacteremia 11/11/2014  .  Atherosclerotic PVD with ulceration ([place]) 10/28/2014  . Hyperlipidemia 10/07/2014  . Obesity 10/07/2014  . Moderate aortic stenosis 10/07/2014  . Multifocal atrial tachycardia ([place]) 10/06/2014  . Sepsis ([place]) 10/05/2014  . Wound of right leg 08/28/2014  . Foot ulcer ([place])  01/04/2013  . Cellulitis 01/04/2013  . PAD (peripheral artery disease) ([place]) 01/04/2013  . Bilateral lower extremity edema 10/23/2012  . Atherosclerosis of native arteries of the extremities with ulceration ([place]) 10/23/2012  . Aspiration pneumonia ([place]) 08/17/2012  . Toxic encephalopathy 08/17/2012  . COPD with acute exacerbation ([place]) 08/17/2012  . Sacral decubitus ulcer 08/17/2012  . Chronic pain 08/17/2012  . Anxiety disorder 08/17/2012  . Chronic kidney disease, stage III (moderate) 08/17/2012  . Hypothyroidism 02/21/2008  . DYSLIPIDEMIA 02/21/2008  . ANXIETY DEPRESSION 02/21/2008  . RHEUMATIC FEVER 02/21/2008  . Hypertensive heart disease with CHF ([place]) 02/21/2008  . COPD (chronic obstructive pulmonary disease) ([place]) 02/21/2008  . ESOPHAGEAL MOTILITY DISORDER 02/21/2008  . ESOPHAGEAL DIVERTICULUM 02/21/2008  . SLEEP APNEA 02/21/2008  . ATHEROSCLEROSIS 07/11/2007  . INGUINAL HERNIAS, BILATERAL 07/11/2007  . HERNIA, UMBILICAL [id number]  . CHOLELITHIASIS 07/11/2007  . UNSPECIFIED RENAL SCLEROSIS 07/11/2007  . TUBULOVILLOUS ADENOMA, COLON 11/02/2005  . GERD 11/02/2005  . DIVERTICULOSIS OF COLON 11/02/2005  . MELANOSIS COLI 11/02/2005    CBC    Component Value Date/Time   WBC 12.2* 02/10/2016 0648   RBC 3.64* 02/10/2016 0648   HGB 9.6* 02/10/2016 0648   HCT 30.0* 02/10/2016 0648   PLT 143* 02/10/2016 0648   MCV 82.4 02/10/2016 0648   LYMPHSABS 1.8 02/07/2016 0025   MONOABS 0.9 02/07/2016 0025   EOSABS 0.4 02/07/2016 0025   BASOSABS 0.0 02/07/2016 0025    CMP     Component Value Date/Time   NA 141 02/10/2016 0648   K 4.0 02/10/2016 0648   CL 97* 02/10/2016 0648   CO2 31 02/10/2016 0648   GLUCOSE 110* 02/10/2016 0648   BUN 20 02/10/2016 0648   CREATININE 1.70* 02/10/2016 0648   CALCIUM 8.3* 02/10/2016 0648   PROT 6.1* 02/07/2016 0025   ALBUMIN 2.5* 02/07/2016 0025   AST 20 02/07/2016 0025   ALT 10* 02/07/2016 0025   ALKPHOS 69 02/07/2016 0025   BILITOT  0.6 02/07/2016 0025   GFRNONAA 35* 02/10/2016 0648   GFRAA 41* 02/10/2016 0648    Assessment and Plan   Charlsie Fleeger C,      This encounter was created in error - please disregard.

## 2016-04-01 ENCOUNTER — Encounter: Payer: Self-pay | Admitting: Internal Medicine

## 2016-04-01 ENCOUNTER — Non-Acute Institutional Stay (SKILLED_NURSING_FACILITY): Payer: Medicare Other | Admitting: Internal Medicine

## 2016-04-01 DIAGNOSIS — J690 Pneumonitis due to inhalation of food and vomit: Secondary | ICD-10-CM | POA: Diagnosis not present

## 2016-04-01 NOTE — Assessment & Plan Note (Addendum)
Parts of his hx sound like CHF, but I don't think it is acute. Pedal edema un changed, weight the same, lungs do not sound wet. So will consider this aspiration which would be expected. Start Augmentin 500/125 BID for 7 days. Scheduled duoneb TID for 7 days along with usual resp meds.  . O2 prn got keep sat > 92%, Will monitor

## 2016-04-01 NOTE — Progress Notes (Signed)
MRN: [id number] Name: [name]  Sex: male Age: 82 y.o. DOB: 12/15/1929  [place] #: [name] Facility/Room:217B Level Of Care: SNF Provider: Inocencio Homes D Emergency Contacts: Extended Emergency Contact Information Primary Emergency Contact: [name],[name] Address: [address]          [place] Phone: [phone] Mobile Phone: [phone] Relation: Spouse Secondary Emergency Contact: [name],[name]  [place] Mobile Phone: [phone] Relation: Son  Code Status:   Allergies: Review of patient's allergies indicates no known allergies.  Chief Complaint  Patient presents with  . Acute Visit    HPI: Patient is 82 y.o. male who is being seen in f/u of work-up started yesterday. Pt had c/o CP. Last time pt c/o CP he has PNA. He is at high risk 2/2 to aspiration. For quality of life he signed a dysphagia waver so he could eat and esp drink what he wanted to. Cp is all over front of chest and comes and goes. He admits it is not like his heart pain which hurts only on the left. It doesn't hurt with deep breath He does admit he is SOB, and feels bad and is coughing occasionally.Marland Kitchen He does admit he can't breath when he lies flat. His O2 sat on RA is 98%, it was reported to be 91% yesterday. He always has pedal edema because he never puts his feet up. He does not want to be hospitalized.  Past Medical History  Diagnosis Date  . Hyperlipidemia   . COPD (chronic obstructive pulmonary disease) ([place])   . Leg pain   . GERD (gastroesophageal reflux disease)   . Depression   . Hypothyroidism   . Peripheral vascular disease ([place])     a. noninvasive studies suggesting mod-severe arterial insufficiency confirmed by prior PV angiograms, followed by Dr. Donnetta Hutching).  . ED (erectile dysfunction)   . Hypertension     08/17/12 - wife denies  . H/O hiatal hernia   . Migraine     "when I was a young boy"  . Arthritis     "hands"  (08/28/2014)  . Anxiety   . Chronic kidney disease (CKD), stage III (moderate)     [name] 08/28/2014  . Diabetes mellitus with neuropathy ([place])   . Bipolar disorder ([place])   . Aortic stenosis     a. Mod-severe by echo.  . Chronic venous insufficiency   . NSVT (nonsustained ventricular tachycardia) ([place])     a. 12/2015 in setting of low Mg.  . Multifocal atrial tachycardia ([place])   . Renal artery stenosis ([facility])     a. s/p prior L renal artery stent.  Marland Kitchen RBBB   . Chronic diastolic CHF (congestive heart failure) [facility])     Past Surgical History  Procedure Laterality Date  . Renal artery stent Left   . Kidney stone surgery  1989  . Lower extremity angiogram Left 03-20-2014    Dr. Tinnie Gens  . Tonsillectomy    . Cataract extraction, bilateral Bilateral   . Blepharoplasty Right   . Abdominal aortagram N/A 03/20/2013    Procedure: ABDOMINAL Maxcine Ham;  Surgeon: [name], MD;  Location: [facility] CATH LAB;  Service: Cardiovascular;  Laterality: N/A;  . Lower extremity angiogram Left 03/20/2013    Procedure: LOWER EXTREMITY ANGIOGRAM;  Surgeon: [name], MD;  Location: [facility] CATH LAB;  Service: Cardiovascular;  Laterality: Left;      Medication List       This list is  accurate as of: 04/01/16  8:36 PM.  Always use your most recent med list.               acetaminophen 325 MG tablet  Commonly known as:  TYLENOL  Take 650 mg by mouth every 6 (six) hours as needed for mild pain, moderate pain, fever or headache.     aspirin EC 81 MG tablet  Take 81 mg by mouth daily.     bisacodyl 10 MG suppository  Commonly known as:  DULCOLAX  Place 1 suppository (10 mg total) rectally once.     carbidopa-levodopa 10-100 MG tablet  Commonly known as:  SINEMET IR  Take 1 tablet by mouth at bedtime.     collagenase ointment  Commonly known as:  SANTYL  Apply topically daily. Apply daily on right heel, as part of pressure ulcer care.     diltiazem 120 MG 24 hr capsule  Commonly known as:   DILACOR XR  Take 120 mg by mouth daily. Take with a 240 mg capsule for a 360 mg dose     diltiazem 240 MG 24 hr capsule  Commonly known as:  CARDIZEM CD  Take 240 mg by mouth daily. Take with a 120 mg capsule for a 360 mg dose     GLUCERNA Liqd  Take 237 mLs by mouth See admin instructions. Offer 1 can (237 mls) by mouth between breakfast and lunch     feeding supplement (ENSURE) Pudg  Take 1 Container by mouth 3 (three) times daily between meals.     fentaNYL 50 MCG/HR  Commonly known as:  DURAGESIC - dosed mcg/hr  Place 1 patch (50 mcg total) onto the skin every 3 (three) days. REMOVE OLD PATCH NOTE SITE EXTERNAL USE ONLY     Fluticasone-Salmeterol 250-50 MCG/DOSE Aepb  Commonly known as:  ADVAIR  Inhale 1 puff into the lungs 2 (two) times daily. Rinse mouth after inhalation and spit out     furosemide 40 MG tablet  Commonly known as:  LASIX  Take 1 tablet (40 mg total) by mouth 2 (two) times daily.     gabapentin 300 MG capsule  Commonly known as:  NEURONTIN  Take 1 capsule (300 mg total) by mouth 2 (two) to 3 (three)  times daily.     ipratropium 0.02 % nebulizer solution  Commonly known as:  ATROVENT  Take 0.5 mg by nebulization every 6 (six) hours as needed for shortness of breath.     ipratropium-albuterol 0.5-2.5 (3) MG/3ML Soln  Commonly known as:  DUONEB  Take 3 mLs by nebulization every 6 (six) hours as needed (shortness of breathe.).     levothyroxine 112 MCG tablet  Commonly known as:  SYNTHROID, LEVOTHROID  Take 112 mcg by mouth daily before breakfast.     linagliptin 5 MG Tabs tablet  Commonly known as:  TRADJENTA  Take 1 tablet (5 mg total) by mouth daily.     LORazepam 0.5 MG tablet  Commonly known as:  ATIVAN  Take 1 tablet (0.5 mg total) by mouth 3 (three) times daily. 8am, 12pm, 8pm     MELATIN PO  Take 1 tablet by mouth at bedtime.     MILK OF MAGNESIA PO  Take 30 mLs by mouth daily as needed (mild constipation).     multivitamin with  minerals Tabs tablet  Take 1 tablet by mouth daily.     omeprazole 20 MG capsule  Commonly known as:  PRILOSEC  Take 20  mg by mouth daily before breakfast.     oxycodone 5 MG capsule  Commonly known as:  OXY-IR  Take 1 capsule (5 mg total) by mouth every 8 (eight) hours as needed for pain.     polyethylene glycol packet  Commonly known as:  MIRALAX / GLYCOLAX  Take 17 g by mouth daily.     potassium chloride SA 20 MEQ tablet  Commonly known as:  K-DUR,KLOR-CON  Take 1 tablet (20 mEq total) by mouth 2 (two) times daily.     QUEtiapine 25 MG tablet  Commonly known as:  SEROQUEL  Take 1 tablet (25 mg total) by mouth at bedtime.     sertraline 100 MG tablet  Commonly known as:  ZOLOFT  Take 100 mg by mouth at bedtime.     simvastatin 10 MG tablet  Commonly known as:  ZOCOR  Take 10 mg by mouth at bedtime.        No orders of the defined types were placed in this encounter.    Immunization History  Administered Date(s) Administered  . Influenza,inj,Quad PF,36+ Mos 08/30/2014    Social History  Substance Use Topics  . Smoking status: Former Smoker -- 1.00 packs/day for 25 years    Types: Cigarettes    Quit date: 08/17/2012  . Smokeless tobacco: Never Used  . Alcohol Use: 0.0 oz/week    0 Standard drinks or equivalent per week     Comment: 08/28/2014 "last drink was in ~ 2012"    Review of Systems  DATA OBTAINED: from patient, nurse GENERAL:  no fevers, fatigue, appetite changes, " feels bad" SKIN: No itching, rash HEENT: No complaint RESPIRATORY: some cough, no wheezing, +SOB CARDIAC: + chest pain, overall chest ,intermiddent, no palpitations, +lower extremity edema  GI: No abdominal pain, No N/V/D or constipation, No heartburn or reflux  GU: No dysuria, frequency or urgency, or incontinence  MUSCULOSKELETAL: No unrelieved bone/joint pain NEUROLOGIC: No headache, dizziness  PSYCHIATRIC: No overt anxiety or sadness  Filed Vitals:   04/01/16 2035  BP:  110/67  Pulse: 79  Temp: 97.1 F (36.2 C)  Resp: 18    Physical Exam  GENERAL APPEARANCE: Alert, conversant, looks tired SKIN: No diaphoresis rash, B legs wrapped HEENT: Unremarkable RESPIRATORY: Breathing is even, unlabored. Lung sounds are diffusely decreased, no rales or rhonchi or wheezing   CARDIOVASCULAR: Heart irreg, 2/6  S-Murmur, no rubs or gallops.B legs wrapped, 2+ edema above wrapping GASTROINTESTINAL: Abdomen is soft, non-tender, not distended w/ normal bowel sounds.  GENITOURINARY: Bladder non tender, not distended  MUSCULOSKELETAL: No abnormal joints or musculature NEUROLOGIC: Cranial nerves 2-12 grossly intact. Moves all extremities PSYCHIATRIC: Mood and affect appropriate to situation, no behavioral issues  Patient Active Problem List   Diagnosis Date Noted  . Encounter for family conference with patient present 02/20/2016  . Pressure ulcer 02/10/2016  . Pain in the chest   . Chronic renal failure, stage 3 (moderate)   . Benign essential HTN   . Parkinson disease ([place])   . Anemia of chronic disease   . Pneumonia 02/06/2016  . Wounds, multiple open, lower extremity 01/23/2016  . Nonsustained ventricular tachycardia ([place]) 01/19/2016  . MRSA pneumonia ([place]) 01/19/2016  . Diabetic neuropathy ([place]) 01/18/2016  . Chronic venous insufficiency 01/18/2016  . Acute kidney injury ([place]) 01/17/2016  . Hypomagnesemia 01/16/2016  . Altered mental status 01/16/2016  . Stage III chronic kidney disease 01/16/2016  . HCAP (healthcare-associated pneumonia) 01/04/2016  . Hypotension 11/23/2015  . Candida rash  of groin 09/23/2015  . Bipolar disorder ([place]) 07/14/2015  . Aortic stenosis 07/09/2015  . Chronic diastolic CHF (congestive heart failure) ([place]) 07/09/2015  . Sepsis due to pneumonia ([place]) 07/08/2015  . Acute respiratory failure with hypoxia ([place]) 07/02/2015  . Pulmonary edema 07/02/2015  . Leukocytosis 07/02/2015  . Type 2 diabetes mellitus with stage 3 chronic  kidney disease ([place]) 07/02/2015  . Chronic osteomyelitis of right foot ([place]) 11/11/2014  . Beta-hemolytic group B streptococcal sepsis ([place]) 11/11/2014  . Bacteremia 11/11/2014  . Atherosclerotic PVD with ulceration ([place]) 10/28/2014  . Hyperlipidemia 10/07/2014  . Obesity 10/07/2014  . Moderate aortic stenosis 10/07/2014  . Multifocal atrial tachycardia ([place]) 10/06/2014  . Sepsis ([place]) 10/05/2014  . Wound of right leg 08/28/2014  . Foot ulcer ([place]) 01/04/2013  . Cellulitis 01/04/2013  . PAD (peripheral artery disease) ([place]) 01/04/2013  . Bilateral lower extremity edema 10/23/2012  . Atherosclerosis of native arteries of the extremities with ulceration ([place]) 10/23/2012  . Aspiration pneumonia ([place]) 08/17/2012  . Toxic encephalopathy 08/17/2012  . COPD with acute exacerbation ([place]) 08/17/2012  . Sacral decubitus ulcer 08/17/2012  . Chronic pain 08/17/2012  . Anxiety disorder 08/17/2012  . Chronic kidney disease, stage III (moderate) 08/17/2012  . Hypothyroidism 02/21/2008  . DYSLIPIDEMIA 02/21/2008  . ANXIETY DEPRESSION 02/21/2008  . RHEUMATIC FEVER 02/21/2008  . Hypertensive heart disease with CHF ([place]) 02/21/2008  . COPD (chronic obstructive pulmonary disease) ([place]) 02/21/2008  . ESOPHAGEAL MOTILITY DISORDER 02/21/2008  . ESOPHAGEAL DIVERTICULUM 02/21/2008  . SLEEP APNEA 02/21/2008  . ATHEROSCLEROSIS 07/11/2007  . INGUINAL HERNIAS, BILATERAL 07/11/2007  . HERNIA, UMBILICAL [id number]  . CHOLELITHIASIS 07/11/2007  . UNSPECIFIED RENAL SCLEROSIS 07/11/2007  . TUBULOVILLOUS ADENOMA, COLON 11/02/2005  . GERD 11/02/2005  . DIVERTICULOSIS OF COLON 11/02/2005  . MELANOSIS COLI 11/02/2005    CBC    Component Value Date/Time   WBC 12.2* 02/10/2016 0648   RBC 3.64* 02/10/2016 0648   HGB 9.6* 02/10/2016 0648   HCT 30.0* 02/10/2016 0648   PLT 143* 02/10/2016 0648   MCV 82.4 02/10/2016 0648   LYMPHSABS 1.8 02/07/2016 0025   MONOABS 0.9 02/07/2016 0025   EOSABS 0.4 02/07/2016  0025   BASOSABS 0.0 02/07/2016 0025    CMP     Component Value Date/Time   NA 141 02/10/2016 0648   K 4.0 02/10/2016 0648   CL 97* 02/10/2016 0648   CO2 31 02/10/2016 0648   GLUCOSE 110* 02/10/2016 0648   BUN 20 02/10/2016 0648   CREATININE 1.70* 02/10/2016 0648   CALCIUM 8.3* 02/10/2016 0648   PROT 6.1* 02/07/2016 0025   ALBUMIN 2.5* 02/07/2016 0025   AST 20 02/07/2016 0025   ALT 10* 02/07/2016 0025   ALKPHOS 69 02/07/2016 0025   BILITOT 0.6 02/07/2016 0025   GFRNONAA 35* 02/10/2016 0648   GFRAA 41* 02/10/2016 0648    4/13 CXR - mod patchy B densities c/w PNA or pulm edema, small B effusions, cardiac size mildly prominent  Assessment and Plan  Aspiration pneumonia Parts of his hx sound like CHF, but I don't think it is acute. Pedal edema un changed, weight the same, lungs do not sound wet. So will consider this aspiration which would be expected. Start Augmentin 500/125 BID for 7 days. Scheduled duoneb TID for 7 days . O2 prn got keep sat > 92%, Will monitor   Time spent > 35 min;> 50% of time with patient was spent reviewing records, labs, tests and studies, counseling and developing plan of care  Hennie Duos, MD

## 2016-04-03 ENCOUNTER — Inpatient Hospital Stay (HOSPITAL_COMMUNITY)
Admission: EM | Admit: 2016-04-03 | Discharge: 2016-04-18 | DRG: 871 | Disposition: E | Payer: Medicare Other | Attending: Pulmonary Disease | Admitting: Pulmonary Disease

## 2016-04-03 ENCOUNTER — Encounter: Payer: Self-pay | Admitting: Internal Medicine

## 2016-04-03 ENCOUNTER — Encounter (HOSPITAL_COMMUNITY): Payer: Self-pay | Admitting: Emergency Medicine

## 2016-04-03 ENCOUNTER — Emergency Department (HOSPITAL_COMMUNITY): Payer: Medicare Other

## 2016-04-03 DIAGNOSIS — Z7984 Long term (current) use of oral hypoglycemic drugs: Secondary | ICD-10-CM

## 2016-04-03 DIAGNOSIS — E871 Hypo-osmolality and hyponatremia: Secondary | ICD-10-CM | POA: Diagnosis present

## 2016-04-03 DIAGNOSIS — I471 Supraventricular tachycardia: Secondary | ICD-10-CM | POA: Diagnosis present

## 2016-04-03 DIAGNOSIS — N183 Chronic kidney disease, stage 3 unspecified: Secondary | ICD-10-CM | POA: Diagnosis present

## 2016-04-03 DIAGNOSIS — Z833 Family history of diabetes mellitus: Secondary | ICD-10-CM

## 2016-04-03 DIAGNOSIS — I5033 Acute on chronic diastolic (congestive) heart failure: Secondary | ICD-10-CM | POA: Diagnosis present

## 2016-04-03 DIAGNOSIS — J690 Pneumonitis due to inhalation of food and vomit: Secondary | ICD-10-CM | POA: Diagnosis present

## 2016-04-03 DIAGNOSIS — F319 Bipolar disorder, unspecified: Secondary | ICD-10-CM | POA: Diagnosis present

## 2016-04-03 DIAGNOSIS — A419 Sepsis, unspecified organism: Secondary | ICD-10-CM | POA: Diagnosis not present

## 2016-04-03 DIAGNOSIS — I739 Peripheral vascular disease, unspecified: Secondary | ICD-10-CM | POA: Diagnosis present

## 2016-04-03 DIAGNOSIS — K7200 Acute and subacute hepatic failure without coma: Secondary | ICD-10-CM | POA: Diagnosis present

## 2016-04-03 DIAGNOSIS — Z79899 Other long term (current) drug therapy: Secondary | ICD-10-CM

## 2016-04-03 DIAGNOSIS — I872 Venous insufficiency (chronic) (peripheral): Secondary | ICD-10-CM | POA: Diagnosis present

## 2016-04-03 DIAGNOSIS — I214 Non-ST elevation (NSTEMI) myocardial infarction: Secondary | ICD-10-CM

## 2016-04-03 DIAGNOSIS — N179 Acute kidney failure, unspecified: Secondary | ICD-10-CM | POA: Diagnosis present

## 2016-04-03 DIAGNOSIS — J189 Pneumonia, unspecified organism: Secondary | ICD-10-CM | POA: Diagnosis present

## 2016-04-03 DIAGNOSIS — I130 Hypertensive heart and chronic kidney disease with heart failure and stage 1 through stage 4 chronic kidney disease, or unspecified chronic kidney disease: Secondary | ICD-10-CM | POA: Diagnosis present

## 2016-04-03 DIAGNOSIS — I4510 Unspecified right bundle-branch block: Secondary | ICD-10-CM | POA: Diagnosis present

## 2016-04-03 DIAGNOSIS — R0602 Shortness of breath: Secondary | ICD-10-CM | POA: Diagnosis not present

## 2016-04-03 DIAGNOSIS — L89610 Pressure ulcer of right heel, unstageable: Secondary | ICD-10-CM | POA: Diagnosis present

## 2016-04-03 DIAGNOSIS — Z87891 Personal history of nicotine dependence: Secondary | ICD-10-CM

## 2016-04-03 DIAGNOSIS — E1122 Type 2 diabetes mellitus with diabetic chronic kidney disease: Secondary | ICD-10-CM | POA: Diagnosis present

## 2016-04-03 DIAGNOSIS — E039 Hypothyroidism, unspecified: Secondary | ICD-10-CM | POA: Diagnosis present

## 2016-04-03 DIAGNOSIS — I452 Bifascicular block: Secondary | ICD-10-CM | POA: Diagnosis present

## 2016-04-03 DIAGNOSIS — I469 Cardiac arrest, cause unspecified: Secondary | ICD-10-CM | POA: Diagnosis present

## 2016-04-03 DIAGNOSIS — K219 Gastro-esophageal reflux disease without esophagitis: Secondary | ICD-10-CM | POA: Diagnosis present

## 2016-04-03 DIAGNOSIS — L8990 Pressure ulcer of unspecified site, unspecified stage: Secondary | ICD-10-CM | POA: Diagnosis present

## 2016-04-03 DIAGNOSIS — R579 Shock, unspecified: Secondary | ICD-10-CM

## 2016-04-03 DIAGNOSIS — I350 Nonrheumatic aortic (valve) stenosis: Secondary | ICD-10-CM | POA: Diagnosis present

## 2016-04-03 DIAGNOSIS — Y95 Nosocomial condition: Secondary | ICD-10-CM | POA: Diagnosis present

## 2016-04-03 DIAGNOSIS — D638 Anemia in other chronic diseases classified elsewhere: Secondary | ICD-10-CM | POA: Diagnosis present

## 2016-04-03 DIAGNOSIS — E872 Acidosis: Secondary | ICD-10-CM | POA: Diagnosis present

## 2016-04-03 DIAGNOSIS — I5041 Acute combined systolic (congestive) and diastolic (congestive) heart failure: Secondary | ICD-10-CM

## 2016-04-03 DIAGNOSIS — J441 Chronic obstructive pulmonary disease with (acute) exacerbation: Secondary | ICD-10-CM | POA: Diagnosis present

## 2016-04-03 DIAGNOSIS — G20 Parkinson's disease: Secondary | ICD-10-CM | POA: Diagnosis present

## 2016-04-03 DIAGNOSIS — Z4659 Encounter for fitting and adjustment of other gastrointestinal appliance and device: Secondary | ICD-10-CM

## 2016-04-03 DIAGNOSIS — Z8249 Family history of ischemic heart disease and other diseases of the circulatory system: Secondary | ICD-10-CM

## 2016-04-03 DIAGNOSIS — J9601 Acute respiratory failure with hypoxia: Secondary | ICD-10-CM

## 2016-04-03 DIAGNOSIS — E785 Hyperlipidemia, unspecified: Secondary | ICD-10-CM | POA: Diagnosis present

## 2016-04-03 DIAGNOSIS — L89620 Pressure ulcer of left heel, unstageable: Secondary | ICD-10-CM | POA: Diagnosis present

## 2016-04-03 DIAGNOSIS — I509 Heart failure, unspecified: Secondary | ICD-10-CM

## 2016-04-03 DIAGNOSIS — J810 Acute pulmonary edema: Secondary | ICD-10-CM

## 2016-04-03 DIAGNOSIS — Z7982 Long term (current) use of aspirin: Secondary | ICD-10-CM

## 2016-04-03 DIAGNOSIS — J9600 Acute respiratory failure, unspecified whether with hypoxia or hypercapnia: Secondary | ICD-10-CM

## 2016-04-03 DIAGNOSIS — R1310 Dysphagia, unspecified: Secondary | ICD-10-CM

## 2016-04-03 DIAGNOSIS — R680 Hypothermia, not associated with low environmental temperature: Secondary | ICD-10-CM | POA: Diagnosis present

## 2016-04-03 LAB — CBC WITH DIFFERENTIAL/PLATELET
Basophils Absolute: 0 10*3/uL (ref 0.0–0.1)
Basophils Relative: 0 %
EOS ABS: 0 10*3/uL (ref 0.0–0.7)
Eosinophils Relative: 0 %
HCT: 29.2 % — ABNORMAL LOW (ref 39.0–52.0)
Hemoglobin: 9.5 g/dL — ABNORMAL LOW (ref 13.0–17.0)
LYMPHS ABS: 1.2 10*3/uL (ref 0.7–4.0)
Lymphocytes Relative: 4 %
MCH: 25.3 pg — AB (ref 26.0–34.0)
MCHC: 32.5 g/dL (ref 30.0–36.0)
MCV: 77.7 fL — AB (ref 78.0–100.0)
MONO ABS: 1.1 10*3/uL — AB (ref 0.1–1.0)
MONOS PCT: 3 %
NEUTROS PCT: 93 %
Neutro Abs: 30.9 10*3/uL — ABNORMAL HIGH (ref 1.7–7.7)
PLATELETS: 398 10*3/uL (ref 150–400)
RBC: 3.76 MIL/uL — ABNORMAL LOW (ref 4.22–5.81)
RDW: 16.7 % — ABNORMAL HIGH (ref 11.5–15.5)
WBC: 33.2 10*3/uL — ABNORMAL HIGH (ref 4.0–10.5)

## 2016-04-03 LAB — BLOOD GAS, ARTERIAL
ACID-BASE DEFICIT: 5.6 mmol/L — AB (ref 0.0–2.0)
Bicarbonate: 18.7 mEq/L — ABNORMAL LOW (ref 20.0–24.0)
Drawn by: 11249
O2 CONTENT: 6 L/min
O2 Saturation: 80.4 %
PATIENT TEMPERATURE: 97.5
PCO2 ART: 33.2 mmHg — AB (ref 35.0–45.0)
PH ART: 7.367 (ref 7.350–7.450)
PO2 ART: 50.7 mmHg — AB (ref 80.0–100.0)
TCO2: 17.7 mmol/L (ref 0–100)

## 2016-04-03 LAB — BASIC METABOLIC PANEL
Anion gap: 20 — ABNORMAL HIGH (ref 5–15)
BUN: 70 mg/dL — AB (ref 6–20)
CHLORIDE: 91 mmol/L — AB (ref 101–111)
CO2: 20 mmol/L — ABNORMAL LOW (ref 22–32)
Calcium: 6 mg/dL — CL (ref 8.9–10.3)
Creatinine, Ser: 3.12 mg/dL — ABNORMAL HIGH (ref 0.61–1.24)
GFR calc Af Amer: 19 mL/min — ABNORMAL LOW (ref >60)
GFR, EST NON AFRICAN AMERICAN: 17 mL/min — AB (ref >60)
GLUCOSE: 155 mg/dL — AB (ref 65–99)
POTASSIUM: 4.8 mmol/L (ref 3.5–5.1)
Sodium: 131 mmol/L — ABNORMAL LOW (ref 135–145)

## 2016-04-03 LAB — I-STAT CG4 LACTIC ACID, ED: Lactic Acid, Venous: 4.16 mmol/L (ref 0.5–2.0)

## 2016-04-03 LAB — TROPONIN I: Troponin I: 4.03 ng/mL (ref <0.031)

## 2016-04-03 LAB — BRAIN NATRIURETIC PEPTIDE: B Natriuretic Peptide: 2579.7 pg/mL — ABNORMAL HIGH (ref 0.0–100.0)

## 2016-04-03 MED ORDER — DEXTROSE 5 % IV SOLN
2.0000 g | Freq: Two times a day (BID) | INTRAVENOUS | Status: DC
  Administered 2016-04-03: 2 g via INTRAVENOUS
  Filled 2016-04-03: qty 2

## 2016-04-03 MED ORDER — VANCOMYCIN HCL IN DEXTROSE 1-5 GM/200ML-% IV SOLN
1000.0000 mg | Freq: Once | INTRAVENOUS | Status: AC
  Administered 2016-04-03: 1000 mg via INTRAVENOUS
  Filled 2016-04-03: qty 200

## 2016-04-03 MED ORDER — DEXTROSE 5 % IV SOLN
500.0000 mg | Freq: Once | INTRAVENOUS | Status: DC
  Filled 2016-04-03: qty 500

## 2016-04-03 MED ORDER — DEXTROSE 5 % IV SOLN
2.0000 g | Freq: Once | INTRAVENOUS | Status: AC
  Administered 2016-04-03: 2 g via INTRAVENOUS
  Filled 2016-04-03: qty 2

## 2016-04-03 MED ORDER — IPRATROPIUM-ALBUTEROL 0.5-2.5 (3) MG/3ML IN SOLN
3.0000 mL | Freq: Once | RESPIRATORY_TRACT | Status: AC
  Administered 2016-04-03: 3 mL via RESPIRATORY_TRACT
  Filled 2016-04-03: qty 3

## 2016-04-03 MED ORDER — PROPOFOL 1000 MG/100ML IV EMUL
0.0000 ug/kg/min | INTRAVENOUS | Status: DC
  Administered 2016-04-04: 40 ug/kg/min via INTRAVENOUS
  Administered 2016-04-04: 5 ug/kg/min via INTRAVENOUS
  Filled 2016-04-03 (×2): qty 100

## 2016-04-03 MED ORDER — DEXTROSE 5 % IV SOLN: 1.0000 g | Freq: Once | INTRAVENOUS | Status: DC

## 2016-04-03 MED ORDER — ASPIRIN 81 MG PO CHEW
324.0000 mg | CHEWABLE_TABLET | Freq: Once | ORAL | Status: AC
  Administered 2016-04-03: 324 mg via ORAL
  Filled 2016-04-03: qty 4

## 2016-04-03 NOTE — Progress Notes (Signed)
Patient ID: [name], male   DOB: [birth year], 82 y.o.   MRN: [id number]  MRN: [id number] Name: [name]  Sex: male Age: 82 y.o. DOB: 1929-12-03  [name] Facility/Room:217 Level Of Care: SNF Provider: Wille [name] Emergency Contacts: Extended Emergency Contact Information Primary Emergency Contact: [name] Address: [address]          [place] Phone: [phone] Mobile Phone: [phone] Relation: Spouse Secondary Emergency Contact: [name]  [place] Mobile Phone: [phone] Relation: Son  Code Status:   Allergies: Review of patient's allergies indicates no known allergies.  Chief Complaint  Patient presents with  . Acute Visit  Secondary to feeling weak-hypotension-chest discomfort-  HPI: Patient is 82 y.o. male with COPD, hypothyroidism, HTN, CKD, DM, CAD, dementia who was brought  Recently  e to [name] with cc of chest pain. t was admited to [name] for 2/18-22 for HCAP. W/u revealed CP not likely cardiac. Pt  admitted to SNF with generalized weakness and for residential care. While at [name] pt followed for hypothyroidism, tx with synthroid, chronic CHF, tx with lasix and HTN, tx with Lasix and cardizem.  He has finished a course of antibiotics.    He also has a history per chart review of grade 1 diastolic dysfunction he is on Lasix 40 mg twice a day with potassium supplementation.  His wife has asked me to evaluate him patient is complaining of some increased weakness-he does have weakness baseline but apparently the last few days it has increased actually says he's feeling a bit better today than he did earlier in the week.  Blood pressures at times appear to be somewhat low at 90/60 today-previous reading 112/68-I do see previous 1 of 96/62 apparently occasionally he does have a systolic under 921.  I do note he is on Cardizem with a history of atrial fibrillation 360 mg  a day as well.  Apparently his rate at times is somewhat difficult to control with pulses somewhat over 100 at times although today I got 80.  Patient also complaining some chest discomfort he describes this more as respiratory-related when he takes a deep breath-he does not complain of any radiation of this any syncopal-type feelings other than feeling generally weak.      Patient has had some gradual decline he has indicated he does not want to go back to the [name]- a MOSTt form has been completed--per discussion with nursing and is my understanding they do not feel at this point hospice as needed since his needs can be met by nursing staff-again no aggressive measures appear to be desired here patient and his wife.    I do note his wife has signed waiver that he can have a regular diet-apparently the mechanical soft nectar thick liquid diet does not agreeable to him i--apparently has led to poor by mouth intake-it is thought when back to regular diet will help with this although it  will increase the risk of aspiration and patient's  family is aware of this  Past Medical History  Diagnosis Date  . Hyperlipidemia   . COPD (chronic obstructive pulmonary disease) ([name])   . Leg pain   . GERD (gastroesophageal reflux disease)   . Depression   . Hypothyroidism   . Peripheral vascular disease ([name])     a. noninvasive studies suggesting mod-severe arterial insufficiency confirmed by prior PV angiograms, followed by Dr. Donnetta Hutching).  . ED (erectile dysfunction)   .  Hypertension     08/17/12 - wife denies  . H/O hiatal hernia   . Migraine     "when I was a young boy"  . Arthritis     "hands" (08/28/2014)  . Anxiety   . Chronic kidney disease (CKD), stage III (moderate)     [name] 08/28/2014  . Diabetes mellitus with neuropathy ([name])   . Bipolar disorder ([name])   . Aortic stenosis     a. Mod-severe by echo.  . Chronic venous insufficiency   . NSVT (nonsustained ventricular tachycardia)  ([name])     a. 12/2015 in setting of low Mg.  . Multifocal atrial tachycardia ([place])   . Renal artery stenosis ([name])     a. s/p prior L renal artery stent.  Marland Kitchen RBBB   . Chronic diastolic CHF (congestive heart failure) [name])     Past Surgical History  Procedure Laterality Date  . Renal artery stent Left   . Kidney stone surgery  1989  . Lower extremity angiogram Left 03-20-2014    Dr. Tinnie Gens  . Tonsillectomy    . Cataract extraction, bilateral Bilateral   . Blepharoplasty Right   . Abdominal aortagram N/A 03/20/2013    Procedure: ABDOMINAL Maxcine Ham;  Surgeon: [name], MD;  Location: [facility] CATH LAB;  Service: Cardiovascular;  Laterality: N/A;  . Lower extremity angiogram Left 03/20/2013    Procedure: LOWER EXTREMITY ANGIOGRAM;  Surgeon: [name], MD;  Location: [facility] CATH LAB;  Service: Cardiovascular;  Laterality: Left;      Medication List       This list is accurate as of: 03/31/16 11:59 PM.  Always use your most recent med list.               acetaminophen 325 MG tablet  Commonly known as:  TYLENOL  Take 650 mg by mouth every 6 (six) hours as needed for mild pain, moderate pain, fever or headache.     aspirin EC 81 MG tablet  Take 81 mg by mouth daily.     bisacodyl 10 MG suppository  Commonly known as:  DULCOLAX  Place 1 suppository (10 mg total) rectally once.     carbidopa-levodopa 10-100 MG tablet  Commonly known as:  SINEMET IR  Take 1 tablet by mouth at bedtime.     collagenase ointment  Commonly known as:  SANTYL  Apply topically daily. Apply daily on right heel, as part of pressure ulcer care.     diltiazem 120 MG 24 hr capsule  Commonly known as:  DILACOR XR  Take 120 mg by mouth daily. Take with a 240 mg capsule for a 360 mg dose     diltiazem 240 MG 24 hr capsule  Commonly known as:  CARDIZEM CD  Take 240 mg by mouth daily. Take with a 120 mg capsule for a 360 mg dose     GLUCERNA Liqd  Take 237 mLs by mouth See admin instructions. Offer  1 can (237 mls) by mouth between breakfast and lunch     feeding supplement (ENSURE) Pudg  Take 1 Container by mouth 3 (three) times daily between meals.     fentaNYL 50 MCG/HR  Commonly known as:  DURAGESIC - dosed mcg/hr  Place 1 patch (50 mcg total) onto the skin every 3 (three) days. REMOVE OLD PATCH NOTE SITE EXTERNAL USE ONLY     Fluticasone-Salmeterol 250-50 MCG/DOSE Aepb  Commonly known as:  ADVAIR  Inhale 1 puff into the lungs 2 (two)  times daily. Rinse mouth after inhalation and spit out     furosemide 40 MG tablet  Commonly known as:  LASIX  Take 1 tablet (40 mg total) by mouth 2 (two) times daily.     gabapentin 300 MG capsule  Commonly known as:  NEURONTIN  Take 1 capsule (300 mg total) by mouth 2 (two) to 3 (three)  times daily.     ipratropium 0.02 % nebulizer solution  Commonly known as:  ATROVENT  Take 0.5 mg by nebulization every 6 (six) hours as needed for shortness of breath.     ipratropium-albuterol 0.5-2.5 (3) MG/3ML Soln  Commonly known as:  DUONEB  Take 3 mLs by nebulization every 6 (six) hours as needed (shortness of breathe.).     levothyroxine 112 MCG tablet  Commonly known as:  SYNTHROID, LEVOTHROID  Take 112 mcg by mouth daily before breakfast.     linagliptin 5 MG Tabs tablet  Commonly known as:  TRADJENTA  Take 1 tablet (5 mg total) by mouth daily.     LORazepam 0.5 MG tablet  Commonly known as:  ATIVAN  Take 1 tablet (0.5 mg total) by mouth 3 (three) times daily. 8am, 12pm, 8pm     MELATIN PO  Take 1 tablet by mouth at bedtime.     MILK OF MAGNESIA PO  Take 30 mLs by mouth daily as needed (mild constipation).     multivitamin with minerals Tabs tablet  Take 1 tablet by mouth daily.     omeprazole 20 MG capsule  Commonly known as:  PRILOSEC  Take 20 mg by mouth daily before breakfast.     oxycodone 5 MG capsule  Commonly known as:  OXY-IR  Take 1 capsule (5 mg total) by mouth every 8 (eight) hours as needed for pain.      polyethylene glycol packet  Commonly known as:  MIRALAX / GLYCOLAX  Take 17 g by mouth daily.     potassium chloride SA 20 MEQ tablet  Commonly known as:  K-DUR,KLOR-CON  Take 1 tablet (20 mEq total) by mouth 2 (two) times daily.     QUEtiapine 25 MG tablet  Commonly known as:  SEROQUEL  Take 1 tablet (25 mg total) by mouth at bedtime.     sertraline 100 MG tablet  Commonly known as:  ZOLOFT  Take 100 mg by mouth at bedtime.     simvastatin 10 MG tablet  Commonly known as:  ZOCOR  Take 10 mg by mouth at bedtime.          Immunization History  Administered Date(s) Administered  . Influenza,inj,Quad PF,36+ Mos 08/30/2014    Social History  Substance Use Topics  . Smoking status: Former Smoker -- 1.00 packs/day for 25 years    Types: Cigarettes    Quit date: 08/17/2012  . Smokeless tobacco: Never Used  . Alcohol Use: 0.0 oz/week    0 Standard drinks or equivalent per week     Comment: 08/28/2014 "last drink was in ~ 2012"    Family history is+ AAA, ETOH abuse   Review of Systems  DATA OBTAINED: from patient, nurse, GENERAL:  no fevers  HAS, fatigue, and weakness  SKIN: No itching, rash; wounds dressed EYES: No eye pain, redness, discharge EARS: No earache, tinnitus, change in hearing NOSE: No congestion, drainage or bleeding  MOUTH/THROAT: No mouth or tooth pain, No sore throat RESPIRATORY: No cough, wheezing, SOB beyond baseline however he is complaining of some chest discomfort  when  breathing  CARDIAC:  chest pain, that it does not appear to be cardiac related at this point---no palpitations baseline, lower extremity edema  GI: No abdominal pain, No N/V/D or constipation, No heartburn or reflux ; pt wants to drink regular ice water GU: No dysuria, frequency or urgency, or incontinence  MUSCULOSKELETAL: No unrelieved bone/joint pain NEUROLOGIC: No headache, PSYCHIATRIC: No c/o anxiety or sadness   Filed Vitals:   03/21/2016 1855  BP: 90/60  Pulse: 80  Resp:  17    O2 saturation is 90 percent      Physical Exam  GENERAL APPEARANCE: Alert, conversant,  No acute distress. Looks frail SKIN: No diaphoresis rash  HEAD: Normocephalic, atraumatic  EYES: Conjunctiva/lids clear. Pupils round, reactive. EOMs intact.   NOSE: No deformity or discharge.  MOUTH/THROAT: Mucous membranes moist oropharynx clear  RESPIRATORY: Breathing is even, unlabored. Lung sounds are clear  but shallow CARDIOVASCULAR: Heart regular  irregular ---   2-3/74mrmur Appears 2 plus peripheral edema.  -- both legs are wrapped GASTROINTESTINAL: Abdomen is soft, non-tender, not distended w/ normal bowel sounds; GENITOURINARY: Bladder non tender, not distended -- MUSCULOSKELETAL: No abnormal joints or musculature--drinks appears to be intact does have lower extremity weakness which is baseline  NEUROLOGIC:  Cranial nerves 2-12 grossly intact. Moves all extremities  generally weak but again does not appear to be a focal deficit-cranial nerves are intact speech is clear PSYCHIATRIC: Mood and affect appropriate to situation, no behavioral issues  Patient Active Problem List   Diagnosis Date Noted  . Encounter for family conference with patient present 02/20/2016  . Pressure ulcer 02/10/2016  . Pain in the chest   . Chronic renal failure, stage 3 (moderate)   . Benign essential HTN   . Parkinson disease ([place]   . Anemia of chronic disease   . Pneumonia 02/06/2016  . Wounds, multiple open, lower extremity 01/23/2016  . Nonsustained ventricular tachycardia ([place] 01/19/2016  . MRSA pneumonia ([facility] 01/19/2016  . Diabetic neuropathy ([place] 01/18/2016  . Chronic venous insufficiency 01/18/2016  . Acute kidney injury (HE[place] 01/17/2016  . Hypomagnesemia 01/16/2016  . Altered mental status 01/16/2016  . Stage III chronic kidney disease 01/16/2016  . HCAP (healthcare-associated pneumonia) 01/04/2016  . Hypotension 11/23/2015  . Candida rash of groin 09/23/2015  . Bipolar disorder  ([place] 07/14/2015  . Aortic stenosis 07/09/2015  . Chronic diastolic CHF (congestive heart failure) (HGruetli[place]La[place] 07/09/2015  . Sepsis due to pneumonia (HFriends[place] 07/08/2015  . Acute respiratory failure with hypoxia (HCl[place] 07/02/2015  . Pulmonary edema 07/02/2015  . Leukocytosis 07/02/2015  . Type 2 diabetes mellitus with stage 3 chronic kidney disease (HMcR[facility] 07/02/2015  . Chronic osteomyelitis of right foot (HB[place] 11/11/2014  . Beta-hemolytic group B streptococcal sepsis (HRansom 11/11/2014  . Bacteremia 11/11/2014  . Atherosclerotic PVD with ulceration (HH[place] 10/28/2014  . Hyperlipidemia 10/07/2014  . Obesity 10/07/2014  . Moderate aortic stenosis 10/07/2014  . Multifocal atrial tachycardia (HFoley 10/06/2014  . Sepsis (HCre[place] 10/05/2014  . Wound of right leg 08/28/2014  . Foot ulcer (HMil[place] 01/04/2013  . Cellulitis 01/04/2013  . PAD (peripheral artery disease) (HBo[place] 01/04/2013  . Bilateral lower extremity edema 10/23/2012  . Atherosclerosis of native arteries of the extremities with ulceration (HWest Hur[place] 10/23/2012  . Aspiration pneumonia (HN[place] 08/17/2012  . Toxic encephalopathy 08/17/2012  . COPD with acute exacerbation (HAtgl[place] 08/17/2012  . Sacral decubitus ulcer 08/17/2012  . Chronic pain 08/17/2012  . Anxiety disorder 08/17/2012  . Chronic kidney disease, stage III (moderate) 08/17/2012  . Hypothyroidism  02/21/2008  . DYSLIPIDEMIA 02/21/2008  . ANXIETY DEPRESSION 02/21/2008  . RHEUMATIC FEVER 02/21/2008  . Hypertensive heart disease with CHF ([place]) 02/21/2008  . COPD (chronic obstructive pulmonary disease) ([place]) 02/21/2008  . ESOPHAGEAL MOTILITY DISORDER 02/21/2008  . ESOPHAGEAL DIVERTICULUM 02/21/2008  . SLEEP APNEA 02/21/2008  . ATHEROSCLEROSIS 07/11/2007  . INGUINAL HERNIAS, BILATERAL 07/11/2007  . HERNIA, UMBILICAL 28/36/6294  . CHOLELITHIASIS 07/11/2007  . UNSPECIFIED RENAL SCLEROSIS 07/11/2007  . TUBULOVILLOUS ADENOMA, COLON 11/02/2005  . GERD 11/02/2005  . DIVERTICULOSIS OF COLON 11/02/2005   . MELANOSIS COLI 11/02/2005   Labs.  03/04/2016.  Sodium 138 potassium 3.7 BUN 30 creatinine 1.2.  02/26/2016.  WBC 9.4 hemoglobin 10.1 platelets 183.    02/15/2016.  WBC 18.1 hemoglobin 10.1 platelets 269.  Sodium 136 potassium 5.3 BUN 35 creatinine 2.88  CBC    Component Value Date/Time   WBC 12.2* 02/10/2016 0648   RBC 3.64* 02/10/2016 0648   HGB 9.6* 02/10/2016 0648   HCT 30.0* 02/10/2016 0648   PLT 143* 02/10/2016 0648   MCV 82.4 02/10/2016 0648   LYMPHSABS 1.8 02/07/2016 0025   MONOABS 0.9 02/07/2016 0025   EOSABS 0.4 02/07/2016 0025   BASOSABS 0.0 02/07/2016 0025    CMP     Component Value Date/Time   NA 141 02/10/2016 0648   K 4.0 02/10/2016 0648   CL 97* 02/10/2016 0648   CO2 31 02/10/2016 0648   GLUCOSE 110* 02/10/2016 0648   BUN 20 02/10/2016 0648   CREATININE 1.70* 02/10/2016 0648   CALCIUM 8.3* 02/10/2016 0648   PROT 6.1* 02/07/2016 0025   ALBUMIN 2.5* 02/07/2016 0025   AST 20 02/07/2016 0025   ALT 10* 02/07/2016 0025   ALKPHOS 69 02/07/2016 0025   BILITOT 0.6 02/07/2016 0025   GFRNONAA 35* 02/10/2016 0648   GFRAA 41* 02/10/2016 0648    Lab Results  Component Value Date   HGBA1C 6.4* 10/07/2014     Dg Chest Portable 1 View  02/06/2016  CLINICAL DATA:  Acute on chronic left-sided chest pain, former smoker EXAM: PORTABLE CHEST 1 VIEW COMPARISON:  01/15/2016 FINDINGS: Moderate cardiac enlargement similar to prior study infiltrate left lower lobe less severe when compared to the prior examination. Infiltrate right lower lobe similar to prior study. Vascular pattern normal. Bilateral shoulder arthropathy. IMPRESSION: Findings consistent with persistent but improved left lower lobe pneumonia. Also suspect right lower lobe pneumonia. Hyper attenuation to the right of midline new the medial aspect of the right diaphragm likely represents residual barium within distal esophagus given some rotation of the patient. Patient had a barium study performed  02/04/2016. Electronically Signed   By: Skipper Cliche M.D.   On: 02/06/2016 19:56    Not all labs, radiology exams or other studies done during hospitalization come through on my EPIC note; however they are reviewed by me.    Assessment and Plan  # weakness-this could be multifocal-patient's blood pressure is somewhat low at times will write an order to hold his Lasix for any systolic blood pressure less than [id number] may complicate his lower extremity edema challenging situation but will monitor.--  Also will check a TSH  For some associated chest discomfort that appears to be respiratory related with his history and risk of aspiration will check a chest x-ray 2 view.  Also monitor blood pressure and pulse every shift for 72 hours to keep an eye on him.  Will hold his Lasix tonight as well.      history of healthcare associated pneumonia  He has completed antibiotics again he is at risk for aspiration will check a chest x-ray and monitor vital signs closely  COPD  he is on inhaled steroids LABA and when necessary nebulizers.  # .  -history of chronic diastolic CHF-currently on Lasix 40 mg twice a day and potassium 20 mEq day Again will hold Lasix tonight in monitor blood pressure and pulse-hold Lasix for systolic blood pressure less than 100    history renal insufficiency creatinine at 1.2 on March 17 appears markedly improved I suspect this may reflect [name] interventions-at this point will update a metabolic panel.  Anemia of chronic disease most recent hemoglobin is 10.1 we will update this as well.   [id number] note greater than 35 minutes spent assessing patient-discussing his status with nursing staff-reviewing his chart-and coordinating and formulating plan of care for numerous diagnoses-of note greater than 50% of time spent coordinating plan of care       Jood Retana C,

## 2016-04-03 NOTE — Progress Notes (Signed)
This encounter was created in error - please disregard.

## 2016-04-03 NOTE — ED Notes (Signed)
Bed: [id number] Expected date:  Expected time:  Means of arrival:  Comments: EMS 82yo M shortness of breath

## 2016-04-03 NOTE — ED Notes (Signed)
Received critical lab values from lab. CALCIUM 6.0 and TROPONIN 4.03. Provider and primary nurse are aware.

## 2016-04-03 NOTE — ED Notes (Signed)
Pt comes to Ed via EMS, sob and hx of diagnosed pneumonia.  Pt comes from [facility], in [place].  Pt has a 22 in left hand. V/s on arrival signs are 130/ 72, spo2 93 on [place] at 4 liters 02, pulse 92, right [place] block on 12 lead. Alert and oriented. Reports no pain, no wheezing but diminished lung sounds bilaterally.  No known allergies, pt is MOST with pink paper documentation for fluids. Paper states possible DNR but that needs to asked again. Pt was de sating and was not compliant with his breathing tx and no wearing his oxygen.

## 2016-04-04 ENCOUNTER — Inpatient Hospital Stay (HOSPITAL_COMMUNITY): Payer: Medicare Other

## 2016-04-04 ENCOUNTER — Emergency Department (HOSPITAL_COMMUNITY): Payer: Medicare Other

## 2016-04-04 DIAGNOSIS — N183 Chronic kidney disease, stage 3 (moderate): Secondary | ICD-10-CM | POA: Diagnosis present

## 2016-04-04 DIAGNOSIS — E785 Hyperlipidemia, unspecified: Secondary | ICD-10-CM | POA: Diagnosis present

## 2016-04-04 DIAGNOSIS — I4510 Unspecified right bundle-branch block: Secondary | ICD-10-CM | POA: Diagnosis present

## 2016-04-04 DIAGNOSIS — I872 Venous insufficiency (chronic) (peripheral): Secondary | ICD-10-CM | POA: Diagnosis present

## 2016-04-04 DIAGNOSIS — F319 Bipolar disorder, unspecified: Secondary | ICD-10-CM | POA: Diagnosis present

## 2016-04-04 DIAGNOSIS — I214 Non-ST elevation (NSTEMI) myocardial infarction: Secondary | ICD-10-CM | POA: Diagnosis present

## 2016-04-04 DIAGNOSIS — E039 Hypothyroidism, unspecified: Secondary | ICD-10-CM | POA: Diagnosis present

## 2016-04-04 DIAGNOSIS — I739 Peripheral vascular disease, unspecified: Secondary | ICD-10-CM | POA: Diagnosis present

## 2016-04-04 DIAGNOSIS — J690 Pneumonitis due to inhalation of food and vomit: Secondary | ICD-10-CM | POA: Diagnosis present

## 2016-04-04 DIAGNOSIS — I469 Cardiac arrest, cause unspecified: Secondary | ICD-10-CM | POA: Diagnosis present

## 2016-04-04 DIAGNOSIS — Y95 Nosocomial condition: Secondary | ICD-10-CM | POA: Diagnosis present

## 2016-04-04 DIAGNOSIS — L89610 Pressure ulcer of right heel, unstageable: Secondary | ICD-10-CM | POA: Diagnosis present

## 2016-04-04 DIAGNOSIS — K219 Gastro-esophageal reflux disease without esophagitis: Secondary | ICD-10-CM | POA: Diagnosis present

## 2016-04-04 DIAGNOSIS — Z79899 Other long term (current) drug therapy: Secondary | ICD-10-CM | POA: Diagnosis not present

## 2016-04-04 DIAGNOSIS — I5033 Acute on chronic diastolic (congestive) heart failure: Secondary | ICD-10-CM | POA: Diagnosis present

## 2016-04-04 DIAGNOSIS — E872 Acidosis: Secondary | ICD-10-CM | POA: Diagnosis present

## 2016-04-04 DIAGNOSIS — Z8249 Family history of ischemic heart disease and other diseases of the circulatory system: Secondary | ICD-10-CM | POA: Diagnosis not present

## 2016-04-04 DIAGNOSIS — G20 Parkinson's disease: Secondary | ICD-10-CM | POA: Diagnosis present

## 2016-04-04 DIAGNOSIS — A419 Sepsis, unspecified organism: Secondary | ICD-10-CM

## 2016-04-04 DIAGNOSIS — I5021 Acute systolic (congestive) heart failure: Secondary | ICD-10-CM | POA: Diagnosis not present

## 2016-04-04 DIAGNOSIS — R680 Hypothermia, not associated with low environmental temperature: Secondary | ICD-10-CM | POA: Diagnosis present

## 2016-04-04 DIAGNOSIS — I471 Supraventricular tachycardia: Secondary | ICD-10-CM | POA: Diagnosis present

## 2016-04-04 DIAGNOSIS — K7200 Acute and subacute hepatic failure without coma: Secondary | ICD-10-CM | POA: Diagnosis present

## 2016-04-04 DIAGNOSIS — J9601 Acute respiratory failure with hypoxia: Secondary | ICD-10-CM | POA: Diagnosis present

## 2016-04-04 DIAGNOSIS — Z7982 Long term (current) use of aspirin: Secondary | ICD-10-CM | POA: Diagnosis not present

## 2016-04-04 DIAGNOSIS — E1122 Type 2 diabetes mellitus with diabetic chronic kidney disease: Secondary | ICD-10-CM | POA: Diagnosis present

## 2016-04-04 DIAGNOSIS — E871 Hypo-osmolality and hyponatremia: Secondary | ICD-10-CM | POA: Diagnosis present

## 2016-04-04 DIAGNOSIS — J189 Pneumonia, unspecified organism: Secondary | ICD-10-CM | POA: Diagnosis present

## 2016-04-04 DIAGNOSIS — Z87891 Personal history of nicotine dependence: Secondary | ICD-10-CM | POA: Diagnosis not present

## 2016-04-04 DIAGNOSIS — Z833 Family history of diabetes mellitus: Secondary | ICD-10-CM | POA: Diagnosis not present

## 2016-04-04 DIAGNOSIS — I5041 Acute combined systolic (congestive) and diastolic (congestive) heart failure: Secondary | ICD-10-CM | POA: Diagnosis present

## 2016-04-04 DIAGNOSIS — I452 Bifascicular block: Secondary | ICD-10-CM | POA: Diagnosis present

## 2016-04-04 DIAGNOSIS — Z7984 Long term (current) use of oral hypoglycemic drugs: Secondary | ICD-10-CM | POA: Diagnosis not present

## 2016-04-04 DIAGNOSIS — D638 Anemia in other chronic diseases classified elsewhere: Secondary | ICD-10-CM | POA: Diagnosis present

## 2016-04-04 DIAGNOSIS — I130 Hypertensive heart and chronic kidney disease with heart failure and stage 1 through stage 4 chronic kidney disease, or unspecified chronic kidney disease: Secondary | ICD-10-CM | POA: Diagnosis present

## 2016-04-04 DIAGNOSIS — R0600 Dyspnea, unspecified: Secondary | ICD-10-CM | POA: Diagnosis not present

## 2016-04-04 DIAGNOSIS — R0602 Shortness of breath: Secondary | ICD-10-CM | POA: Diagnosis present

## 2016-04-04 DIAGNOSIS — I350 Nonrheumatic aortic (valve) stenosis: Secondary | ICD-10-CM | POA: Diagnosis present

## 2016-04-04 DIAGNOSIS — J441 Chronic obstructive pulmonary disease with (acute) exacerbation: Secondary | ICD-10-CM | POA: Diagnosis present

## 2016-04-04 DIAGNOSIS — N179 Acute kidney failure, unspecified: Secondary | ICD-10-CM | POA: Diagnosis present

## 2016-04-04 DIAGNOSIS — L89620 Pressure ulcer of left heel, unstageable: Secondary | ICD-10-CM | POA: Diagnosis present

## 2016-04-04 LAB — BRAIN NATRIURETIC PEPTIDE: B NATRIURETIC PEPTIDE 5: 2322.4 pg/mL — AB (ref 0.0–100.0)

## 2016-04-04 LAB — CBC
HCT: 27.5 % — ABNORMAL LOW (ref 39.0–52.0)
HEMATOCRIT: 25.9 % — AB (ref 39.0–52.0)
HEMOGLOBIN: 9 g/dL — AB (ref 13.0–17.0)
HEMOGLOBIN: 8.7 g/dL — AB (ref 13.0–17.0)
MCH: 24.8 pg — ABNORMAL LOW (ref 26.0–34.0)
MCH: 25.8 pg — ABNORMAL LOW (ref 26.0–34.0)
MCHC: 32.7 g/dL (ref 30.0–36.0)
MCHC: 33.6 g/dL (ref 30.0–36.0)
MCV: 75.8 fL — ABNORMAL LOW (ref 78.0–100.0)
MCV: 76.9 fL — ABNORMAL LOW (ref 78.0–100.0)
Platelets: 321 10*3/uL (ref 150–400)
Platelets: 288 10*3/uL (ref 150–400)
RBC: 3.63 MIL/uL — AB (ref 4.22–5.81)
RBC: 3.37 MIL/uL — AB (ref 4.22–5.81)
RDW: 16.5 % — ABNORMAL HIGH (ref 11.5–15.5)
RDW: 16.7 % — ABNORMAL HIGH (ref 11.5–15.5)
WBC: 29.9 10*3/uL — AB (ref 4.0–10.5)
WBC: 28.7 10*3/uL — ABNORMAL HIGH (ref 4.0–10.5)

## 2016-04-04 LAB — URINALYSIS, ROUTINE W REFLEX MICROSCOPIC
Glucose, UA: NEGATIVE mg/dL
Hgb urine dipstick: NEGATIVE
Ketones, ur: NEGATIVE mg/dL
Leukocytes, UA: NEGATIVE
Nitrite: NEGATIVE
Protein, ur: NEGATIVE mg/dL
Specific Gravity, Urine: 1.018 (ref 1.005–1.030)
pH: 5 (ref 5.0–8.0)

## 2016-04-04 LAB — BLOOD GAS, ARTERIAL
ACID-BASE DEFICIT: 5.6 mmol/L — AB (ref 0.0–2.0)
Bicarbonate: 19.9 mEq/L — ABNORMAL LOW (ref 20.0–24.0)
DRAWN BY: 11249
FIO2: 1
LHR: 14 {breaths}/min
MECHVT: 620 mL
O2 Saturation: 98.9 %
PEEP/CPAP: 5 cmH2O
PO2 ART: 186 mmHg — AB (ref 80.0–100.0)
Patient temperature: 35.5
TCO2: 19.1 mmol/L (ref 0–100)
pCO2 arterial: 39 mmHg (ref 35.0–45.0)
pH, Arterial: 7.32 — ABNORMAL LOW (ref 7.350–7.450)

## 2016-04-04 LAB — BASIC METABOLIC PANEL
Anion gap: 15 (ref 5–15)
BUN: 73 mg/dL — AB (ref 6–20)
CALCIUM: 5.4 mg/dL — AB (ref 8.9–10.3)
CO2: 21 mmol/L — ABNORMAL LOW (ref 22–32)
CREATININE: 3.11 mg/dL — AB (ref 0.61–1.24)
Chloride: 98 mmol/L — ABNORMAL LOW (ref 101–111)
GFR calc non Af Amer: 17 mL/min — ABNORMAL LOW (ref >60)
GFR, EST AFRICAN AMERICAN: 20 mL/min — AB (ref >60)
Glucose, Bld: 145 mg/dL — ABNORMAL HIGH (ref 65–99)
Potassium: 4 mmol/L (ref 3.5–5.1)
SODIUM: 134 mmol/L — AB (ref 135–145)

## 2016-04-04 LAB — ECHOCARDIOGRAM COMPLETE
Height: 73 in
Weight: 2913.6 oz

## 2016-04-04 LAB — HEPARIN LEVEL (UNFRACTIONATED)
HEPARIN UNFRACTIONATED: 0.11 [IU]/mL — AB (ref 0.30–0.70)
HEPARIN UNFRACTIONATED: 0.21 [IU]/mL — AB (ref 0.30–0.70)

## 2016-04-04 LAB — CREATININE, SERUM
CREATININE: 3.23 mg/dL — AB (ref 0.61–1.24)
GFR, EST AFRICAN AMERICAN: 19 mL/min — AB (ref >60)
GFR, EST NON AFRICAN AMERICAN: 16 mL/min — AB (ref >60)

## 2016-04-04 LAB — HEPATIC FUNCTION PANEL
ALK PHOS: 66 U/L (ref 38–126)
ALT: 142 U/L — AB (ref 17–63)
AST: 346 U/L — AB (ref 15–41)
Albumin: 2.3 g/dL — ABNORMAL LOW (ref 3.5–5.0)
BILIRUBIN DIRECT: 0.3 mg/dL (ref 0.1–0.5)
BILIRUBIN INDIRECT: 0.5 mg/dL (ref 0.3–0.9)
Total Bilirubin: 0.8 mg/dL (ref 0.3–1.2)
Total Protein: 6 g/dL — ABNORMAL LOW (ref 6.5–8.1)

## 2016-04-04 LAB — GLUCOSE, CAPILLARY
Glucose-Capillary: 139 mg/dL — ABNORMAL HIGH (ref 65–99)
Glucose-Capillary: 189 mg/dL — ABNORMAL HIGH (ref 65–99)
Glucose-Capillary: 140 mg/dL — ABNORMAL HIGH (ref 65–99)
Glucose-Capillary: 160 mg/dL — ABNORMAL HIGH (ref 65–99)

## 2016-04-04 LAB — TROPONIN I
Troponin I: 3.12 ng/mL (ref <0.031)
Troponin I: 3.66 ng/mL (ref <0.031)

## 2016-04-04 LAB — MRSA PCR SCREENING: MRSA by PCR: POSITIVE — AB

## 2016-04-04 LAB — MAGNESIUM: MAGNESIUM: 0.7 mg/dL — AB (ref 1.7–2.4)

## 2016-04-04 LAB — PHOSPHORUS: PHOSPHORUS: 7.5 mg/dL — AB (ref 2.5–4.6)

## 2016-04-04 LAB — SEDIMENTATION RATE: Sed Rate: 24 mm/hr — ABNORMAL HIGH (ref 0–16)

## 2016-04-04 LAB — TRIGLYCERIDES: TRIGLYCERIDES: 197 mg/dL — AB (ref <150)

## 2016-04-04 LAB — I-STAT CG4 LACTIC ACID, ED: Lactic Acid, Venous: 1.63 mmol/L (ref 0.5–2.0)

## 2016-04-04 MED ORDER — COLLAGENASE 250 UNIT/GM EX OINT
TOPICAL_OINTMENT | Freq: Every day | CUTANEOUS | Status: DC
  Administered 2016-04-04 – 2016-04-06 (×4): via TOPICAL
  Filled 2016-04-04: qty 30

## 2016-04-04 MED ORDER — FUROSEMIDE 10 MG/ML IJ SOLN
60.0000 mg | Freq: Once | INTRAMUSCULAR | Status: DC
  Filled 2016-04-04: qty 8

## 2016-04-04 MED ORDER — SODIUM CHLORIDE 0.9 % IV SOLN
2.0000 g | Freq: Once | INTRAVENOUS | Status: AC
  Administered 2016-04-04: 2 g via INTRAVENOUS
  Filled 2016-04-04: qty 20

## 2016-04-04 MED ORDER — ASPIRIN 81 MG PO CHEW: 324.0000 mg | CHEWABLE_TABLET | Freq: Once | ORAL | Status: DC

## 2016-04-04 MED ORDER — MAGNESIUM SULFATE 2 GM/50ML IV SOLN
2.0000 g | Freq: Once | INTRAVENOUS | Status: AC
  Administered 2016-04-04: 2 g via INTRAVENOUS
  Filled 2016-04-04: qty 50

## 2016-04-04 MED ORDER — ANTISEPTIC ORAL RINSE SOLUTION (CORINZ)
7.0000 mL | Freq: Four times a day (QID) | OROMUCOSAL | Status: DC
  Administered 2016-04-04 – 2016-04-06 (×8): 7 mL via OROMUCOSAL

## 2016-04-04 MED ORDER — HEPARIN (PORCINE) IN NACL 100-0.45 UNIT/ML-% IJ SOLN
1250.0000 [IU]/h | INTRAMUSCULAR | Status: DC
  Administered 2016-04-04: 1000 [IU]/h via INTRAVENOUS
  Administered 2016-04-04: 1250 [IU]/h via INTRAVENOUS
  Filled 2016-04-04 (×3): qty 250

## 2016-04-04 MED ORDER — FENTANYL BOLUS VIA INFUSION
25.0000 ug | INTRAVENOUS | Status: DC | PRN
  Filled 2016-04-04: qty 25

## 2016-04-04 MED ORDER — LEVOTHYROXINE SODIUM 112 MCG PO TABS
112.0000 ug | ORAL_TABLET | Freq: Every day | ORAL | Status: DC
  Filled 2016-04-04 (×3): qty 1

## 2016-04-04 MED ORDER — CHLORHEXIDINE GLUCONATE 0.12% ORAL RINSE (MEDLINE KIT)
15.0000 mL | Freq: Two times a day (BID) | OROMUCOSAL | Status: DC
  Administered 2016-04-04 – 2016-04-06 (×5): 15 mL via OROMUCOSAL

## 2016-04-04 MED ORDER — HEPARIN SODIUM (PORCINE) 5000 UNIT/ML IJ SOLN
5000.0000 [IU] | Freq: Three times a day (TID) | INTRAMUSCULAR | Status: DC
Start: 2016-04-04 — End: 2016-04-04

## 2016-04-04 MED ORDER — BISACODYL 10 MG RE SUPP: 10.0000 mg | Freq: Every day | RECTAL | Status: DC | PRN

## 2016-04-04 MED ORDER — SIMVASTATIN 40 MG PO TABS: 40.0000 mg | ORAL_TABLET | Freq: Every day | ORAL | Status: DC

## 2016-04-04 MED ORDER — SODIUM CHLORIDE 0.9 % IV SOLN: 250.0000 mL | INTRAVENOUS | Status: DC | PRN

## 2016-04-04 MED ORDER — VITAL HIGH PROTEIN PO LIQD
1000.0000 mL | ORAL | Status: DC
  Filled 2016-04-04: qty 1000

## 2016-04-04 MED ORDER — SODIUM CHLORIDE 0.9 % IV BOLUS (SEPSIS)
1000.0000 mL | Freq: Once | INTRAVENOUS | Status: AC
  Administered 2016-04-04: 1000 mL via INTRAVENOUS

## 2016-04-04 MED ORDER — HEPARIN BOLUS VIA INFUSION
3500.0000 [IU] | Freq: Once | INTRAVENOUS | Status: AC
  Administered 2016-04-04: 3500 [IU] via INTRAVENOUS
  Filled 2016-04-04: qty 3500

## 2016-04-04 MED ORDER — ALBUTEROL SULFATE (2.5 MG/3ML) 0.083% IN NEBU: 2.5000 mg | INHALATION_SOLUTION | RESPIRATORY_TRACT | Status: DC | PRN

## 2016-04-04 MED ORDER — PRO-STAT SUGAR FREE PO LIQD: 30.0000 mL | Freq: Two times a day (BID) | ORAL | Status: DC

## 2016-04-04 MED ORDER — SODIUM CHLORIDE 0.9 % IV BOLUS (SEPSIS)
1000.0000 mL | Freq: Once | INTRAVENOUS | Status: AC
Start: 2016-04-04 — End: 2016-04-04
  Administered 2016-04-04: 1000 mL via INTRAVENOUS

## 2016-04-04 MED ORDER — SODIUM CHLORIDE 0.9 % IV SOLN
25.0000 ug/h | INTRAVENOUS | Status: DC
  Administered 2016-04-04: 50 ug/h via INTRAVENOUS
  Administered 2016-04-05: 250 ug/h via INTRAVENOUS
  Administered 2016-04-05: 225 ug/h via INTRAVENOUS
  Administered 2016-04-05: 250 ug/h via INTRAVENOUS
  Filled 2016-04-04 (×4): qty 50

## 2016-04-04 MED ORDER — HEPARIN BOLUS VIA INFUSION
1000.0000 [IU] | Freq: Once | INTRAVENOUS | Status: AC
  Administered 2016-04-04: 1000 [IU] via INTRAVENOUS
  Filled 2016-04-04: qty 1000

## 2016-04-04 MED ORDER — AMMONIUM LACTATE 12 % EX LOTN
TOPICAL_LOTION | Freq: Every day | CUTANEOUS | Status: DC
  Administered 2016-04-04 – 2016-04-05 (×2): via TOPICAL
  Filled 2016-04-04: qty 400

## 2016-04-04 MED ORDER — ETOMIDATE 2 MG/ML IV SOLN
INTRAVENOUS | Status: AC | PRN
  Administered 2016-04-04: 20 mg via INTRAVENOUS

## 2016-04-04 MED ORDER — QUETIAPINE FUMARATE 50 MG PO TABS: 25.0000 mg | ORAL_TABLET | Freq: Every day | ORAL | Status: DC

## 2016-04-04 MED ORDER — FENTANYL CITRATE (PF) 100 MCG/2ML IJ SOLN
50.0000 ug | Freq: Once | INTRAMUSCULAR | Status: AC
  Administered 2016-04-04: 50 ug via INTRAVENOUS
  Filled 2016-04-04: qty 2

## 2016-04-04 MED ORDER — DEXTROSE 5 % IV SOLN
2.0000 g | INTRAVENOUS | Status: DC
  Administered 2016-04-04 – 2016-04-05 (×2): 2 g via INTRAVENOUS
  Filled 2016-04-04 (×2): qty 2

## 2016-04-04 MED ORDER — SUCCINYLCHOLINE CHLORIDE 20 MG/ML IJ SOLN
INTRAMUSCULAR | Status: AC | PRN
  Administered 2016-04-04: 100 mg via INTRAVENOUS

## 2016-04-04 MED ORDER — HEPARIN (PORCINE) IN NACL 100-0.45 UNIT/ML-% IJ SOLN
1500.0000 [IU]/h | INTRAMUSCULAR | Status: DC
  Administered 2016-04-05 – 2016-04-06 (×2): 1500 [IU]/h via INTRAVENOUS
  Filled 2016-04-04 (×4): qty 250

## 2016-04-04 MED ORDER — VITAL AF 1.2 CAL PO LIQD
1000.0000 mL | ORAL | Status: DC
  Filled 2016-04-04: qty 1000

## 2016-04-04 MED ORDER — HEPARIN BOLUS VIA INFUSION
2000.0000 [IU] | Freq: Once | INTRAVENOUS | Status: AC
  Administered 2016-04-04: 2000 [IU] via INTRAVENOUS
  Filled 2016-04-04: qty 2000

## 2016-04-04 MED ORDER — RANITIDINE HCL 150 MG/10ML PO SYRP
150.0000 mg | ORAL_SOLUTION | Freq: Every day | ORAL | Status: DC
  Filled 2016-04-04 (×4): qty 10

## 2016-04-04 MED ORDER — FENTANYL CITRATE (PF) 100 MCG/2ML IJ SOLN: 25.0000 ug | INTRAMUSCULAR | Status: DC | PRN

## 2016-04-04 MED ORDER — IPRATROPIUM-ALBUTEROL 0.5-2.5 (3) MG/3ML IN SOLN
3.0000 mL | Freq: Four times a day (QID) | RESPIRATORY_TRACT | Status: DC
  Administered 2016-04-04 – 2016-04-06 (×10): 3 mL via RESPIRATORY_TRACT
  Filled 2016-04-04 (×10): qty 3

## 2016-04-04 NOTE — Progress Notes (Addendum)
  Echocardiogram 2D Echocardiogram has been performed.  Jennette Dubin 04/04/2016, 2:20 PM

## 2016-04-04 NOTE — Progress Notes (Signed)
Patient OG tube removed due to improper placement. Abdominal XRay showed need for advancement of OG. While portable x-ray was at bedside RN attempted to advance OG tube, but follow-up abdominal XRay showed OG tube in the same location following advancement attempt. 1630 patient wife at bedside. Wife stated that patient has  been told that he has a pocket in his esophagus. Wife's description of esophageal abnormality sounded as if she was describing a Zenker's diverticulum.Tube feeds not started today due to improper placement of OG. OG removed. Patient CBGs are stable. Dr. Emmit Alexanders in [place] notified. Will continue to monitor.

## 2016-04-04 NOTE — Progress Notes (Signed)
[facility] Progress Note Patient Name: [name] DOB: [birth year] MRN: [id number]   Date of Service  04/04/2016  HPI/Events of Note  1) CVC appears to be pulled out 2) Needs pain meds. Pt is uncomfortable  eICU Interventions  CXR Fentanyl PRN        Nisha Dhami 04/04/2016, 4:03 AM

## 2016-04-04 NOTE — Progress Notes (Signed)
Initial Nutrition Assessment  INTERVENTION:   Monitor magnesium, potassium, and phosphorus daily for at least 3 days, MD to replete as needed, as pt is at risk for refeeding syndrome given Low Mg levels, suspected malnutrition and poor PO intake.  Initiate Vital AF 1.2 @ 15 ml/hr via NGT and increase by 10 ml every 8 hours to goal rate of 55 ml/hr.  30 ml Prostat BID.   Tube feeding regimen provides 1784 kcal (100% of needs), 129 grams of protein, and 1071 ml of H2O.   RD to continue to monitor  NUTRITION DIAGNOSIS:   Inadequate oral intake related to inability to eat as evidenced by NPO status.  GOAL:   Patient will meet greater than or equal to 90% of their needs  MONITOR:   Vent status, Labs, Weight trends, Skin, I & O's, TF tolerance  REASON FOR ASSESSMENT:   Consult, Ventilator Enteral/tube feeding initiation and management  ASSESSMENT:   15M brought by EMS to the ED from his nursing home with complaints of shortness of breath. He apparently has also been complaining of atypical chest pain. He was diagnosed with an aspiration pneumonia and started on Augmentin on 4/14. His shrtness has been progressive. He required 4L O2 initially. After arrival, he was initially treated as code sepsis, then after obtaining a CVP of 32, the decision was made to begin diuresis. His past medical history is significant for chronic heart failure with chronic lower extremity edema and non-healing ulcers on bilateral heels.   Patient in room awake on vent with no family present. Pt unable to provide history and currently restrained. Pt was able to shake his head, that he has not been eating well PTA.   Partial exam completed. Pt with severe temporal and clavicle wasting. Suspect malnutrition of some degree given muscle wasting, his report of poor PO intake and low Mg levels. Monitor for refeeding syndrome.   Patient is currently intubated on ventilator support MV: 13 L/min Temp (24hrs),  Avg:95.7 F (35.4 C), Min:92.7 F (33.7 C), Max:97.5 F (36.4 C)  Labs reviewed: Low Na, Ca, Mg Elevated Phos  Diet Order:  Diet NPO time specified  Skin:  Wound (see comment) (Unstageable and Stage III heel ulcers, Stage II sacral ulcer)  Last BM:  PTA  Height:   Ht Readings from Last 1 Encounters:  04/04/16 6\' 1"  (1.854 m)    Weight:   Wt Readings from Last 1 Encounters:  04/04/16 182 lb 1.6 oz (82.6 kg)    Ideal Body Weight:  83.6 kg  BMI:  Body mass index is 24.03 kg/(m^2).  Estimated Nutritional Needs:   Kcal:  1778  Protein:  120-130g  Fluid:  1.8L/day  EDUCATION NEEDS:   No education needs identified at this time  Clayton Bibles, MS, RD, LDN Pager: [phone] After Hours Pager: [phone]

## 2016-04-04 NOTE — Progress Notes (Signed)
[facility] Progress Note Patient Name: [name] DOB: 1929-05-18 MRN: [id number]   Date of Service  04/04/2016  HPI/Events of Note  Ca 5.4, Mg 0.7  eICU Interventions  Repleted        Herald Vallin 04/04/2016, 6:22 AM

## 2016-04-04 NOTE — Progress Notes (Signed)
[facility] Progress Note Patient Name: [name] DOB: [birth year] MRN: [id number]   Date of Service  04/04/2016  HPI/Events of Note  Troponin = 3.66. Patient is already on ASA, Heparin  IV infusion and is followed by cardiology.  eICU Interventions  Continue present management.      Intervention Category Intermediate Interventions: Diagnostic test evaluation  Lysle Dingwall 04/04/2016, 5:35 PM

## 2016-04-04 NOTE — Progress Notes (Signed)
ANTICOAGULATION CONSULT NOTE - Initial Consult  Pharmacy Consult for Heparin Indication: chest pain/ACS  No Known Allergies  Patient Measurements: Height: 6\' 1"  (185.4 cm) Weight: 182 lb 1.6 oz (82.6 kg) IBW/kg (Calculated) : 79.9 Heparin Dosing Weight: 82 kg  Vital Signs: Temp: 95.7 F (35.4 C) (04/17 1100) BP: 104/54 mmHg (04/17 1100) Pulse Rate: 79 (04/17 1100)  Labs:  Recent Labs  03/30/2016 2151 04/04/16 0208 04/04/16 0517 04/04/16 1125  HGB 9.5* 9.0* 8.7*  --   HCT 29.2* 27.5* 25.9*  --   PLT 398 321 288  --   HEPARINUNFRC  --   --   --  0.11*  CREATININE 3.12* 3.23* 3.11*  --   TROPONINI 4.03*  --   --  3.12*    Estimated Creatinine Clearance: 19.6 mL/min (by C-G formula based on Cr of 3.11).   Medical History: Past Medical History  Diagnosis Date  . Hyperlipidemia   . COPD (chronic obstructive pulmonary disease) ([place])   . Leg pain   . GERD (gastroesophageal reflux disease)   . Depression   . Hypothyroidism   . Peripheral vascular disease ([place])     a. noninvasive studies suggesting mod-severe arterial insufficiency confirmed by prior PV angiograms, followed by Dr. Donnetta Hutching).  . ED (erectile dysfunction)   . Hypertension     08/17/12 - wife denies  . H/O hiatal hernia   . Migraine     "when I was a young boy"  . Arthritis     "hands" (08/28/2014)  . Anxiety   . Chronic kidney disease (CKD), stage III (moderate)     [name] 08/28/2014  . Diabetes mellitus with neuropathy ([place])   . Bipolar disorder ([place])   . Aortic stenosis     a. Mod-severe by echo.  . Chronic venous insufficiency   . NSVT (nonsustained ventricular tachycardia) ([place])     a. 12/2015 in setting of low Mg.  . Multifocal atrial tachycardia ([place])   . Renal artery stenosis (HCC)     a. s/p prior L renal artery stent.  Marland Kitchen RBBB   . Chronic diastolic CHF (congestive heart failure) (HCC)     Medications:  Infusions:  . feeding supplement (VITAL AF 1.2 CAL)    . fentaNYL infusion  INTRAVENOUS 125 mcg/hr (04/04/16 1151)  . heparin 1,000 Units/hr (04/04/16 1100)    Assessment: Patient's an 82 y.o M presented to the ED on 4/17 with SOB.  He was found to have positive cardiac enzymes.  Heparin drip started for r/o ACS.  Today, 04/04/2016: - first heparin level now back sub- therapeutic at 0.11 (no issues with IV line -- per RN) - cbc relatively stable - no bleeding documented  Goal of Therapy:  Heparin level 0.3-0.7 units/ml Monitor platelets by anticoagulation protocol: Yes   Plan:  - Heparin bolus 2000 units iv x1, then increase drip to 1250 units/hr - check 8 hr heparin level - Daily CBC - monitor for s/s bleeding   Leler Brion P 04/04/2016,12:16 PM

## 2016-04-04 NOTE — Consult Note (Addendum)
CARDIOLOGY CONSULT NOTE   Patient ID: [name] MRN: [id number] DOB/AGE: [birth year] 82 y.o.  Admit date: 04/12/2016  Primary Physician   Bruce Reel, MD Primary Cardiologist   Dr [name] saw  09/2014, Dr [name] saw 01/2016 Reason for Consultation   CHF  DPB:AQVOHCS [name] is a 82 y.o. year old male with a history of mod-sev AS, CKD III, COPD, PAD, MAT 2015.  Seen by cards 01/2016 for abnl troponin and abnl echo, MAT. Troponin mildly elevated, pt had PNA, f/u as OP recommended, med rx for AS recommended as well.   Admitted 04/16 w/ sepsis (dx asp PNA 04/14), CVP ?32, BNP elevated, s/p ETT, cards asked to see.  CVP currently ?5, both values suspect.  Pt intubated and sedated. HR generally controlled, frequent ectopy, no life-threatening arrhythmias seen. Mg and K+ being repleted.  Dry weight unclear, but on 04/04 at office visit with Dr Early, wt 170 lbs. Pt respiratory status seemed at baseline.   Pt wt currently 182, I/O are underestimated since 2 L IVF given in ER not listed (among other things), but are still + by 1 L since admission. Lasix 60 mg IV ordered (but not listed as given) in ER. Currently pt borderline hypotensive. SBP 80s but improved to 100s when propofol d/c'd. Not currently on pressors.    Past Medical History  Diagnosis Date  . Hyperlipidemia   . COPD (chronic obstructive pulmonary disease) ([place])   . Leg pain   . GERD (gastroesophageal reflux disease)   . Depression   . Hypothyroidism   . Peripheral vascular disease ([place])     a. noninvasive studies suggesting mod-severe arterial insufficiency confirmed by prior PV angiograms, followed by Dr. Donnetta [name]).  . ED (erectile dysfunction)   . Hypertension     08/17/12 - wife denies  . H/O hiatal hernia   . Migraine     "when I was a young boy"  . Arthritis     "hands" (08/28/2014)  . Anxiety   . Chronic kidney disease (CKD), stage III (moderate)     [name] 08/28/2014  . Diabetes mellitus with  neuropathy ([place])   . Bipolar disorder ([place])   . Aortic stenosis     a. Mod-severe by echo.  . Chronic venous insufficiency   . NSVT (nonsustained ventricular tachycardia) ([place])     a. 12/2015 in setting of low Mg.  . Multifocal atrial tachycardia ([place])   . Renal artery stenosis (HCC)     a. s/p prior L renal artery stent.  Marland Kitchen RBBB   . Chronic diastolic CHF (congestive heart failure) [facility])      Past Surgical History  Procedure Laterality Date  . Renal artery stent Left   . Kidney stone surgery  1989  . Lower extremity angiogram Left 03-20-2014    Dr. Tinnie [name]  . Tonsillectomy    . Cataract extraction, bilateral Bilateral   . Blepharoplasty Right   . Abdominal aortagram N/A 03/20/2013    Procedure: ABDOMINAL [name];  Surgeon: Bruce Misty, MD;  Location: [facility] CATH LAB;  Service: Cardiovascular;  Laterality: N/A;  . Lower extremity angiogram Left 03/20/2013    Procedure: LOWER EXTREMITY ANGIOGRAM;  Surgeon: Bruce Misty, MD;  Location: [facility] CATH LAB;  Service: Cardiovascular;  Laterality: Left;    No Known Allergies  I have reviewed the patient's current medications . ammonium lactate   Topical Daily  . antiseptic oral rinse  7 mL Mouth Rinse QID  .  aspirin  324 mg Per NG tube Once  . ceFEPime (MAXIPIME) IV  2 g Intravenous Q24H  . chlorhexidine gluconate (SAGE KIT)  15 mL Mouth Rinse BID  . collagenase   Topical Daily  . feeding supplement (VITAL HIGH PROTEIN)  1,000 mL Per Tube Q24H  . fentaNYL (SUBLIMAZE) injection  50 mcg Intravenous Once  . ipratropium-albuterol  3 mL Nebulization Q6H  . levothyroxine  112 mcg Per Tube QAC breakfast  . magnesium sulfate 1 - 4 g bolus IVPB  2 g Intravenous Once  . QUEtiapine  25 mg Per Tube QHS  . ranitidine  150 mg Per Tube QHS  . simvastatin  40 mg Oral q1800   . fentaNYL infusion INTRAVENOUS    . heparin 1,000 Units/hr (04/04/16 0800)   sodium chloride, albuterol, bisacodyl, fentaNYL, fentaNYL (SUBLIMAZE) injection  Medication  Sig  acetaminophen (TYLENOL) 325 MG tablet Take 650 mg by mouth every 6 (six) hours as needed for mild pain, moderate pain, fever or headache.   aspirin EC 81 MG tablet Take 81 mg by mouth daily.   bisacodyl (DULCOLAX) 10 MG suppository Place 1 suppository (10 mg total) rectally once. Patient taking differently: Place 10 mg rectally daily as needed for moderate constipation.   carbidopa-levodopa (SINEMET) 10-100 MG per tablet Take 1 tablet by mouth at bedtime.   collagenase (SANTYL) ointment Apply topically daily. Apply daily on right heel, as part of pressure ulcer care.  diltiazem (CARDIZEM CD) 240 MG 24 hr capsule Take 240 mg by mouth daily. Take with a 120 mg capsule for a 360 mg dose  diltiazem (DILACOR XR) 120 MG 24 hr capsule Take 120 mg by mouth daily. Take with a 240 mg capsule for a 360 mg dose  feeding supplement (ENSURE) PUDG Take 1 Container by mouth 3 (three) times daily between meals. Patient taking differently: Take 1 Container by mouth See admin instructions. Offer as afternoon and bedtime snack  fentaNYL (DURAGESIC - DOSED MCG/HR) 50 MCG/HR Place 1 patch (50 mcg total) onto the skin every 3 (three) days. REMOVE OLD PATCH NOTE SITE EXTERNAL USE ONLY  Fluticasone-Salmeterol (ADVAIR) 250-50 MCG/DOSE AEPB Inhale 1 puff into the lungs 2 (two) times daily. Rinse mouth after inhalation and spit out  furosemide (LASIX) 40 MG tablet Take 1 tablet (40 mg total) by mouth 2 (two) times daily.  gabapentin (NEURONTIN) 300 MG capsule Take 1 capsule (300 mg total) by mouth 2 (two) to 3 (three)  times daily. Patient taking differently: Take 300 mg by mouth 3 (three) times daily. 10am, 4pm, 10pm  Hypromellose (ARTIFICIAL TEARS OP) Place 2 drops into both eyes 4 (four) times daily as needed (dry eyes).  ipratropium-albuterol (DUONEB) 0.5-2.5 (3) MG/3ML SOLN Take 3 mLs by nebulization every 6 (six) hours as needed (shortness of breath).   levothyroxine (SYNTHROID, LEVOTHROID) 112 MCG tablet Take 112  mcg by mouth daily before breakfast.   linagliptin (TRADJENTA) 5 MG TABS tablet Take 1 tablet (5 mg total) by mouth daily.  LORazepam (ATIVAN) 0.5 MG tablet Take 1 tablet (0.5 mg total) by mouth 3 (three) times daily. 8am, 12pm, 8pm  magnesium hydroxide (MILK OF MAGNESIA) 400 MG/5ML suspension Take 30 mLs by mouth daily as needed for mild constipation.   Melatonin-Pyridoxine (MELATIN PO) Take 1 tablet by mouth at bedtime.   Multiple Vitamin (MULTIVITAMIN WITH MINERALS) TABS tablet Take 1 tablet by mouth daily.  omeprazole (PRILOSEC) 20 MG capsule Take 20 mg by mouth daily before breakfast.   oxycodone (OXY-IR)  5 MG capsule Take 1 capsule (5 mg total) by mouth every 8 (eight) hours as needed for pain.  polyethylene glycol (MIRALAX / GLYCOLAX) packet Take 17 g by mouth daily.  QUEtiapine (SEROQUEL) 25 MG tablet Take 1 tablet (25 mg total) by mouth at bedtime.  sertraline (ZOLOFT) 100 MG tablet Take 100 mg by mouth at bedtime.   simvastatin (ZOCOR) 10 MG tablet Take 10 mg by mouth at bedtime.   amoxicillin-clavulanate (AUGMENTIN) 500-125 MG tablet Take 1 tablet by mouth every 12 (twelve) hours. ABT Start Date 04/01/16 & End Date 04/08/16.  potassium chloride SA (K-DUR,KLOR-CON) 20 MEQ tablet Take 1 tablet (20 mEq total) by mouth 2 (two) times daily. Patient not taking: Reported on 04/01/2016     Social History   Social History  . Marital Status: Married    Spouse Name: N/A  . Number of Children: N/A  . Years of Education: N/A   Occupational History  . Not on file.   Social History Main Topics  . Smoking status: Former Smoker -- 1.00 packs/day for 25 years    Types: Cigarettes    Quit date: 08/17/2012  . Smokeless tobacco: Never Used  . Alcohol Use: 0.0 oz/week    0 Standard drinks or equivalent per week     Comment: 08/28/2014 "last drink was in ~ 2012"  . Drug Use: No  . Sexual Activity: Not on file   Other Topics Concern  . Not on file   Social History Narrative    Family  Status  Relation Status Death Age  . Mother Deceased   . Father Deceased   . Brother Alive    Family History  Problem Relation Age of Onset  . Other Mother     AAA  . AAA (abdominal aortic aneurysm) Mother   . Heart attack Father   . Alcohol abuse Father   . Diabetes Father   . Other Father     amputation  . Diabetes Brother   . Coronary artery disease Brother   . Dementia Brother      ROS:  Full 14 point review of systems complete and found to be negative unless listed above.  Physical Exam: Blood pressure 75/55, pulse 77, temperature 94.6 F (34.8 C), temperature source Rectal, resp. rate 17, height '6\' 1"'  (1.854 m), weight 182 lb 1.6 oz (82.6 kg), SpO2 100 %.  General: Well developed, well nourished, male sedated on the vent Head: Eyes PERRLA, No xanthomas.   Normocephalic and atraumatic, oropharynx without edema or exudate. Dentition: poor Lungs: bilateral rales Heart: Heart irregular rate and rhythm with S1, muffled S2, 2/6 murmur, louder at the apex. Pulses are 2+ upper extrem. Not palpable in both lower extrem    Neck: No carotid bruits. No lymphadenopathy.  JVD could not be assessed 2nd equipment. Abdomen: Bowel sounds present, abdomen soft and non-tender without masses or hernias noted. Msk:  No spine or cva tenderness. No weakness, no joint deformities or effusions. Extremities: No clubbing or cyanosis. 1-2+ LE edema.  Neuro: Alert and oriented X 3. No focal deficits noted. Skin: bilateral heel wounds are dressed, not disturbed.  Labs:   Lab Results  Component Value Date   WBC 28.7* 04/04/2016   HGB 8.7* 04/04/2016   HCT 25.9* 04/04/2016   MCV 76.9* 04/04/2016   PLT 288 04/04/2016      Recent Labs Lab 04/04/16 0517  NA 134*  K 4.0  CL 98*  CO2 21*  BUN 73*  CREATININE 3.11*  CALCIUM 5.4*  PROT 6.0*  BILITOT 0.8  ALKPHOS 66  ALT 142*  AST 346*  GLUCOSE 145*  ALBUMIN 2.3*   MAGNESIUM  Date Value Ref Range Status  04/04/2016 0.7* 1.7 - 2.4  mg/dL Final    Comment:    CRITICAL RESULT CALLED TO, READ BACK BY AND VERIFIED WITH:  EARLY RN @ 0553 ON 04/04/16 BY C DAVIS     Recent Labs  04/10/2016 2151  TROPONINI 4.03*   B NATRIURETIC PEPTIDE  Date/Time Value Ref Range Status  04/17/2016 09:51 PM 2579.7* 0.0 - 100.0 pg/mL Final  07/09/2015 04:25 AM 215.9* 0.0 - 100.0 pg/mL Final   Lab Results  Component Value Date   TRIG 197* 04/04/2016    Echo: 02/08/2016 - Left ventricle: The cavity size was normal. Systolic function was  normal. The estimated ejection fraction was in the range of 60%  to 65%. There is akinesis of the basalinferior myocardium. There  was an increased relative contribution of atrial contraction to  ventricular filling. Doppler parameters are consistent with  abnormal left ventricular relaxation (grade 1 diastolic dysfunction). - Aortic valve: By calculated AVA the AS appears severe but by peak  velocity and mean gradient it is moderate. Moderate diffuse  thickening and calcification, consistent with sclerosis. There  was moderate to severe stenosis. Valve area (VTI): 0.74 cm^2.  Valve area (Vmax): 0.74 cm^2. Valve area (Vmean): 0.72 cm^2.     Mean gradient (S): 33 mm Hg. Peak gradient (S): 54 mm Hg. - Pulmonary arteries: PA peak pressure: 33 mm Hg (S).   ECG:  04/17 Ectopic atrial rhythm  Radiology:  Dg Chest 2 View 03/21/2016  CLINICAL DATA:  SOB and decreased sats. Pt hx of COPD, CHF, diabetes, HTN. Former smoker. EXAM: CHEST  2 VIEW COMPARISON:  02/10/2016 FINDINGS: Cardiac enlargement with pulmonary vascular congestion. Hazy perihilar infiltrates, likely edema. Small bilateral pleural effusions. Basilar atelectasis or consolidation in both lungs. Findings are progressing since previous study. No pneumothorax. Tortuous and calcified aorta. Degenerative changes in the spine. IMPRESSION: Progressing congestive changes in the heart and lungs since previous study. Electronically Signed   By:  Lucienne Capers M.D.   On: 03/19/2016 22:11   Dg Chest Port 1 View 04/04/2016  CLINICAL DATA:  Assess status of central line position ; history of aortic stenosis, ventricular tachycardia, chronic CHF, diabetes, COPD. Intubated patient. EXAM: PORTABLE CHEST 1 VIEW COMPARISON:  Portable chest x-ray of April 04, 2016 at 1:12 a.m. FINDINGS: The right internal jugular venous catheter tip projects over the junction of the proximal and middle portions of the SVC. This is stable. The endotracheal tube tip projects approximately 5 cm above the carina. The esophagogastric tube tip projects below the inferior margin of the image. The lungs are adequately inflated. The interstitial markings remain increased diffusely. Moderate-sized bilateral pleural effusions persist. The cardiac silhouette remains enlarged. The pulmonary vascularity is slightly less prominent today. IMPRESSION: Stable positioning of the support tubes. Persistent interstitial edema or infiltrate with bilateral pleural effusions. Stable cardiomegaly with slight improvement in pulmonary vascular congestion. Electronically Signed   By: David  Martinique M.D.   On: 04/04/2016 07:25   Dg Chest Port 1 View 04/04/2016  CLINICAL DATA:  Initial evaluation status post intubation. EXAM: PORTABLE CHEST 1 VIEW COMPARISON:  Prior radiograph from 04/14/2016. FINDINGS: Patient is now intubated with the tip of an endotracheal tube approximately 2.8 cm above the carina. Enteric tube courses in the abdomen. The side hole appears to lie within the  distal esophagus. Repositioning recommended. Interval placement of right IJ approach central venous catheter with tip overlying the cavoatrial junction. Cardiomegaly stable.  Mediastinal silhouette within normal limits. Moderate to advanced diffuse pulmonary edema with bilateral pleural effusions again noted, stable. No new process within the lungs. No pneumothorax. Osseous structures unchanged. IMPRESSION: 1. Tip of the endotracheal  tube approximately 2.8 cm above the carina. 2. Side hole of enteric tube within the distal esophagus. Repositioning is recommended. 3. Interval placement of right IJ approach central venous catheter with tip overlying the cavoatrial junction. 4. Findings again consistent with acute CHF exacerbation with diffuse pulmonary edema and bilateral pleural effusions. Electronically Signed   By: Jeannine Boga M.D.   On: 04/04/2016 02:22    ASSESSMENT AND PLAN:   The patient was seen today by Dr Tamala Julian, the patient evaluated and the data reviewed.  Principal Problem:   Acute hypoxemic respiratory failure (HCC) - mgt per CCM - CVP not believed accurate, CCM to obtain  Active Problems:   Acute on chronic diastolic CHF (congestive heart failure), NYHA class 4 (HCC) - needs diuresis, may be less BP effect with a Lasix gtt, 5-10 mg/hr - will defer to CCM, accurate CVP will help.    Type 2 diabetes mellitus with stage 3 chronic kidney disease ([place]) - per CCM    Aortic stenosis - see echo, repeat echo ordered - S2 is muffled so believe AS is severe - general medical condition will not allow any procedures, medical mgt    Hypomagnesemia, hypocalcemia - supp per CCM    Stage III chronic kidney disease - now stage IV - 02/10/2016 BUN/Cr were 20/1.70    Ectopic atrial tachycardia (HCC) - HR generally < 100, follow  SignedLenoard Aden 04/04/2016 9:35 AM Beeper [phone]  Co-Sign MD The patient has been seen in conjunction with Rosaria Ferries, PA-C. All aspects of care have been considered and discussed. The patient has been personally interviewed, examined, and all clinical data has been reviewed.   The patient is elderly with multiple comorbidities including COPD, moderate to severe aortic stenosis, history of atrial arrhythmia (multifocal atrial tachycardia), and chronic kidney disease. Admitted on this occasion with progressive dyspnea and noted to have significant elevation in  troponin and irregular heart rhythm.  The patient is intubated. Blood pressures are soft. Diffuse rhonchi heard throughout the lung fields. Left lower sternal border coarse systolic murmur of aortic stenosis is audible. Bilateral lower extremity edema with chronic stasis changes noted as well.  Telemetry and EKGs demonstrate an atrial rhythm with bifascicular block including right bundle and left anterior hemiblock. Possible old inferior infarct. No acute changes are noted. Chest x-ray demonstrates vascular congestion. Troponin is significantly elevated greater than 32.  Overall the patient was admitted with dyspnea, hypotension, acute kidney injury, and elevated troponin. His underlying medical problems are listed above. He does have CHF (A/C, ?systolic v.Diastolic) It does not appear that the patient is having an acute coronary syndrome. I believe the elevated troponin is likely secondary to other systemic illness.  Diuresis as tolerated by kidney function and blood pressure. Will need to reassess LV function if hypotension continues to be an issue. Given comorbidities and overall clinical condition, invasive evaluation with cardiac catheterization will not be pursued. Overall prognosis seems poor.

## 2016-04-04 NOTE — Progress Notes (Signed)
PULMONARY / CRITICAL CARE MEDICINE   Name: [name] MRN: [id number] DOB: 08-01-1929    ADMISSION DATE:  03/29/2016 CONSULTATION DATE:  04/07/2016  REFERRING MD:  EDP  CHIEF COMPLAINT:  Shortness of breath  SUBJECTIVE:  RN reports pt receiving mag/calcium, intermittent agitation, hypothermia, hypotensive.    VITAL SIGNS: BP 95/47 mmHg  Pulse 73  Temp(Src) 95 F (35 C) (Rectal)  Resp 15  Ht 6\' 1"  (1.854 m)  Wt 182 lb 1.6 oz (82.6 kg)  BMI 24.03 kg/m2  SpO2 100%  HEMODYNAMICS: CVP:  [5 mmHg-38 mmHg] 5 mmHg  VENTILATOR SETTINGS: Vent Mode:  [-] PRVC FiO2 (%):  [40 %-100 %] 40 % Set Rate:  [14 bmp] 14 bmp Vt Set:  [490 mL-620 mL] 620 mL PEEP:  [5 cmH20] 5 cmH20 Plateau Pressure:  [18 cmH20-21 cmH20] 21 cmH20  INTAKE / OUTPUT: I/O last 3 completed shifts: In: 963.5 [I.V.:60.1; Other:20; IV Piggyback:883.3] Out: 200 [Urine:200]  PHYSICAL EXAMINATION: General: frail elderly male in NAD on vent Neuro:  Sedated, moves upper ext to stimulation  HEENT: MM pink/moist, pupils 52mm, ETT, unable to appreciate JVD  CV:  irregular, no audible S2, 3-4/6 AS murmur PULM: even/non-labored, lungs bilaterally clear, diminished / rales lower lateral  GI: soft, bsx4 active Extremities: warm/dry, BLE chronic venous stasis, bilateral heel ulcers   LABS:  BMET  Recent Labs Lab 03/19/2016 2151 04/04/16 0208 04/04/16 0517  NA 131*  --  134*  K 4.8  --  4.0  CL 91*  --  98*  CO2 20*  --  21*  BUN 70*  --  73*  CREATININE 3.12* 3.23* 3.11*  GLUCOSE 155*  --  145*    Electrolytes  Recent Labs Lab 04/05/2016 2151 04/04/16 0517  CALCIUM 6.0* 5.4*  MG  --  0.7*  PHOS  --  7.5*    CBC  Recent Labs Lab 04/16/2016 2151 04/04/16 0208 04/04/16 0517  WBC 33.2* 29.9* 28.7*  HGB 9.5* 9.0* 8.7*  HCT 29.2* 27.5* 25.9*  PLT 398 321 288    Coag's No results for input(s): APTT, INR in the last 168 hours.  Sepsis Markers  Recent Labs Lab 04/13/2016 2237 04/04/16 0237   LATICACIDVEN 4.16* 1.63    ABG  Recent Labs Lab 04/02/2016 2043 04/04/16 0023  PHART 7.367 7.320*  PCO2ART 33.2* 39.0  PO2ART 50.7* 186*    Liver Enzymes  Recent Labs Lab 04/04/16 0517  AST 346*  ALT 142*  ALKPHOS 66  BILITOT 0.8  ALBUMIN 2.3*    Cardiac Enzymes  Recent Labs Lab 03/25/2016 2151  TROPONINI 4.03*    Glucose No results for input(s): GLUCAP in the last 168 hours.  Imaging Dg Chest 2 View  04/01/2016  CLINICAL DATA:  SOB and decreased sats. Pt hx of COPD, CHF, diabetes, HTN. Former smoker. EXAM: CHEST  2 VIEW COMPARISON:  02/10/2016 FINDINGS: Cardiac enlargement with pulmonary vascular congestion. Hazy perihilar infiltrates, likely edema. Small bilateral pleural effusions. Basilar atelectasis or consolidation in both lungs. Findings are progressing since previous study. No pneumothorax. Tortuous and calcified aorta. Degenerative changes in the spine. IMPRESSION: Progressing congestive changes in the heart and lungs since previous study. Electronically Signed   By: Lucienne Capers M.D.   On: 03/24/2016 22:11   Dg Chest Port 1 View  04/04/2016  CLINICAL DATA:  Assess status of central line position ; history of aortic stenosis, ventricular tachycardia, chronic CHF, diabetes, COPD. Intubated patient. EXAM: PORTABLE CHEST 1 VIEW COMPARISON:  Portable  chest x-ray of April 04, 2016 at 1:12 a.m. FINDINGS: The right internal jugular venous catheter tip projects over the junction of the proximal and middle portions of the SVC. This is stable. The endotracheal tube tip projects approximately 5 cm above the carina. The esophagogastric tube tip projects below the inferior margin of the image. The lungs are adequately inflated. The interstitial markings remain increased diffusely. Moderate-sized bilateral pleural effusions persist. The cardiac silhouette remains enlarged. The pulmonary vascularity is slightly less prominent today. IMPRESSION: Stable positioning of the support  tubes. Persistent interstitial edema or infiltrate with bilateral pleural effusions. Stable cardiomegaly with slight improvement in pulmonary vascular congestion. Electronically Signed   By: David  Martinique M.D.   On: 04/04/2016 07:25   Dg Chest Port 1 View  04/04/2016  CLINICAL DATA:  Initial evaluation status post intubation. EXAM: PORTABLE CHEST 1 VIEW COMPARISON:  Prior radiograph from 03/31/2016. FINDINGS: Patient is now intubated with the tip of an endotracheal tube approximately 2.8 cm above the carina. Enteric tube courses in the abdomen. The side hole appears to lie within the distal esophagus. Repositioning recommended. Interval placement of right IJ approach central venous catheter with tip overlying the cavoatrial junction. Cardiomegaly stable.  Mediastinal silhouette within normal limits. Moderate to advanced diffuse pulmonary edema with bilateral pleural effusions again noted, stable. No new process within the lungs. No pneumothorax. Osseous structures unchanged. IMPRESSION: 1. Tip of the endotracheal tube approximately 2.8 cm above the carina. 2. Side hole of enteric tube within the distal esophagus. Repositioning is recommended. 3. Interval placement of right IJ approach central venous catheter with tip overlying the cavoatrial junction. 4. Findings again consistent with acute CHF exacerbation with diffuse pulmonary edema and bilateral pleural effusions. Electronically Signed   By: Jeannine Boga M.D.   On: 04/04/2016 02:22     STUDIES:  CXR 4/16 >> bilateral pulmonary edema, b/l pleural effusions, basilar atelectasis vs pneumonia  CULTURES: Blood 4/16 >> UC 4/16 >>   ANTIBIOTICS: Vanc 4/16 >> Cefepime 4/16 >>  SIGNIFICANT EVENTS: 4/17  Admit, intubation for respiratory distress  LINES/TUBES: RIJ 4/16 >> ETT 4/16 >> Foley 4/16 >>  DISCUSSION: 53M with acute on chronic diastolic CHF (last EF [id number] in Feb 2017), moderate-severe AS, and possible sepsis secondary to  pneumonia vs skin / soft tissue infection (bilateral heels). UA benign, no other pertinent history to suggest other source. He has an AKI and troponin leak. His BNP is markedly elevated. He also has a history of HTN, HLD, diabetes, hypothyroidism, COPD on Advair/DuoNebs and peripheral arterial disease.  ASSESSMENT / PLAN:  PULMONARY A: Acute hypoxemic respiratory failure - likely secondary to dCHF exacerbation and volume overload, although sepsis 2/2 pneumonia cannot be excluded Bilateral Pleural Effusions  Hx COPD P:   PRVC 8cc/kg Wean PEEP / FiO2 for sats 88-95% SBT / WUA as tolerated  Diurese with goal net negative 1-2L daily as BP and renal function tolerate Empiric antibiotics with vanc/cefepime Q6 DuoNebs + Q3 PRN albuterol while intubated - once extubated resume home regimen  CARDIOVASCULAR A:  Troponin leak / NSTEMI Acute on chronic dCHF exacerbation Hx mod-severe AS Known RBBB P:  Diuresis as above Trend troponin Agree with empiric heparin for ACS/NSTEMI for now Follow EKG Await TTE  ASA/Statin Hold antihypertensives Heparin gtt   RENAL A:   Acute kidney injury (Cr 3 from baseline ~1.6) Lactic acidosis Hyponatremia - likely hypervolemic hyponatremia Hypocalcemia - corrects to 6.8 Hypomagnesemia  P:   Diuresis as above Trend BMP / UOP  Trend lactate Calcium 1 gm x1 Magnesium 2 gm x1  GASTROINTESTINAL A:   Shock Liver vs Congestion with CHF - suspect shock liver P:   GI ppx Begin TF  Assess KUB for NGT placement   HEMATOLOGIC A:   Leukocytosis w/ left shift Chronic anemia P:  Trend CBC Heparin gtt as above  INFECTIOUS A:   Sepsis 2/2 pneumonia vs Skin Soft Tissue Infection vs other Bilateral Heel Decubitus Ulcers - present on admission P:   Empiric antibiotics with vanc, cefepime Follow cultures Wound care for feet  ENDOCRINE A:   Hx diabetes Hx hypothyroidism P:   SSI Continue levothyroxine  NEUROLOGIC A:   Parkinsonian  features and probable agitation at baseline P:   RASS goal: 0 to -1 Continue home Sinemet, Seroquel  Fentanyl gtt for pain / sedation D/C propofol with hypotension    FAMILY  - Updates:  No family available.  Per prior discussion, patient & family desired intubation and he is currently a FULL CODE.  Will need to revisit with family.   - Inter-disciplinary family meet or Palliative Care meeting due by: 4/24   Noe Gens, NP-C [facility] Pgr: [phone] or if no answer [phone] 04/04/2016, 8:43 AM

## 2016-04-04 NOTE — Consult Note (Signed)
WOC wound consult note Reason for Consult:Bilateral heel pressure injury with history of mixed venous and arterial insufficiency. Bilateral lower extremity edema and chronic skin changes.  Wound type: Unstageable pressure injury to bilateral heels, present on admission. Mixed etiology with venous and arterial insufficiency.  Chronic lower extremity edema.  Pressure Ulcer POA: Yes Measurement: Left heel 5 cm x 5 cm unable to visualize depth due to the presence of devitalized tissue to wound bed.  Is moist and macerated in the center with dark necrotic tissue to perimeter of wound bed .  Wound [id number] tissue.   Right heel with fissure to medial aspect 0.5 cm x 3.2 cm x 0.2 cm with ruddy red skin to wound perimeter.   Bilateral lower extremities are edematous, with chronic skin changes and scabbed lesions. Will moisturize the lower legs and begin enzymatic debridement to bilateral heel pressure injuries. Drainage (amount, consistency, odor) Minimal serosanguinous.  Musty odor.  Periwound:Chronic dry skin, edema and scabbed lesions to lower legs.  Dressing procedure/placement/frequency:Cleanse bilateral lower legs with soap and water.  Apply Amlactin cream to lower legs daily.  Apply Santyl cream to open wounds on bilateral heels.  Cover with NS moist gauze, Cover with 4x4 and kerlix (to foot/heel).  Change daily.  Will not follow at this time.  Please re-consult if needed.  Domenic Moras RN BSN [place] Pager [phone]

## 2016-04-04 NOTE — Progress Notes (Signed)
Pharmacy Antibiotic Note  [name] is a 82 y.o. male admitted on 03/28/2016 with sepsis.  Pharmacy has been consulted for Cefepime/CCM monitoring dosing.  Plan: Change Cefepime to 2gm iv q24hr  Height: 6\' 1"  (185.4 cm) IBW/kg (Calculated) : 79.9  Temp (24hrs), Avg:95.8 F (35.4 C), Min:92.7 F (33.7 C), Max:97.5 F (36.4 C)   Recent Labs Lab 03/25/2016 2151 03/21/2016 2237 04/04/16 0237  WBC 33.2*  --   --   CREATININE 3.12*  --   --   LATICACIDVEN  --  4.16* 1.63    Estimated Creatinine Clearance: 18.9 mL/min (by C-G formula based on Cr of 3.12).    No Known Allergies  Antimicrobials this admission: Cefepime 4/16 >> Vancomycin 4/16 >> x1 Ceftriaxone 4/16 >> x1  Dose adjustments this admission: -  Microbiology results: Pending  Thank you for allowing pharmacy to be a part of this patient's care.  [name] 04/04/2016 2:49 AM

## 2016-04-04 NOTE — ED Provider Notes (Addendum)
CSN: [id number]     Arrival date & time 03/26/2016  1956 History   First MD Initiated Contact with Patient 03/24/2016 2022     Chief Complaint  Patient presents with  . Shortness of Breath     (Consider location/radiation/quality/duration/timing/severity/associated sxs/prior Treatment) HPI Comments: Pt with hx of COPD, CHF, Moderate to severe AS, recent admission for CAP comes in with cc of dib from rehab. Pt reports that he has been feeling short of breath for the last few days and has had increased cough. He has orthopnea and PND like symptoms, and leg swelling. Pt's cough is producing brown phlem and now has some blood tinge to it. Pt denies chest pain. Pt is not very ambulatory. Denies hx of PE, DVT.  Pt noted to be tachycardic, tachypneic and hypoxic at arrival.   ROS 10 Systems reviewed and are negative for acute change except as noted in the HPI.     Patient is a 82 y.o. male presenting with shortness of breath. The history is provided by the patient and medical records.  Shortness of Breath   Past Medical History  Diagnosis Date  . Hyperlipidemia   . COPD (chronic obstructive pulmonary disease) ([place])   . Leg pain   . GERD (gastroesophageal reflux disease)   . Depression   . Hypothyroidism   . Peripheral vascular disease ([place])     a. noninvasive studies suggesting mod-severe arterial insufficiency confirmed by prior PV angiograms, followed by Dr. Donnetta Hutching).  . ED (erectile dysfunction)   . Hypertension     08/17/12 - wife denies  . H/O hiatal hernia   . Migraine     "when I was a young boy"  . Arthritis     "hands" (08/28/2014)  . Anxiety   . Chronic kidney disease (CKD), stage III (moderate)     [name] 08/28/2014  . Diabetes mellitus with neuropathy ([place])   . Bipolar disorder ([place])   . Aortic stenosis     a. Mod-severe by echo.  . Chronic venous insufficiency   . NSVT (nonsustained ventricular tachycardia) ([place])     a. 12/2015 in setting of low Mg.  . Multifocal  atrial tachycardia ([place])   . Renal artery stenosis (HCC)     a. s/p prior L renal artery stent.  Marland Kitchen RBBB   . Chronic diastolic CHF (congestive heart failure) [facility])    Past Surgical History  Procedure Laterality Date  . Renal artery stent Left   . Kidney stone surgery  1989  . Lower extremity angiogram Left 03-20-2014    Dr. Tinnie Gens  . Tonsillectomy    . Cataract extraction, bilateral Bilateral   . Blepharoplasty Right   . Abdominal aortagram N/A 03/20/2013    Procedure: ABDOMINAL Maxcine Ham;  Surgeon: [name], MD;  Location: [facility] CATH LAB;  Service: Cardiovascular;  Laterality: N/A;  . Lower extremity angiogram Left 03/20/2013    Procedure: LOWER EXTREMITY ANGIOGRAM;  Surgeon: [name], MD;  Location: [facility] CATH LAB;  Service: Cardiovascular;  Laterality: Left;   Family History  Problem Relation Age of Onset  . Other Mother     AAA  . AAA (abdominal aortic aneurysm) Mother   . Heart attack Father   . Alcohol abuse Father   . Diabetes Father   . Other Father     amputation  . Diabetes Brother   . Coronary artery disease Brother   . Dementia Brother    Social History  Substance Use Topics  .  Smoking status: Former Smoker -- 1.00 packs/day for 25 years    Types: Cigarettes    Quit date: 08/17/2012  . Smokeless tobacco: Never Used  . Alcohol Use: 0.0 oz/week    0 Standard drinks or equivalent per week     Comment: 08/28/2014 "last drink was in ~ 2012"    Review of Systems  Respiratory: Positive for shortness of breath.       Allergies  Review of patient's allergies indicates no known allergies.  Home Medications   Prior to Admission medications   Medication Sig Start Date End Date Taking? Authorizing Provider  acetaminophen (TYLENOL) 325 MG tablet Take 650 mg by mouth every 6 (six) hours as needed for mild pain, moderate pain, fever or headache.    Yes Historical Provider, MD  aspirin EC 81 MG tablet Take 81 mg by mouth daily.    Yes Historical Provider,  MD  bisacodyl (DULCOLAX) 10 MG suppository Place 1 suppository (10 mg total) rectally once. Patient taking differently: Place 10 mg rectally daily as needed for moderate constipation.  01/19/16  Yes Christina P Rama, MD  carbidopa-levodopa (SINEMET) 10-100 MG per tablet Take 1 tablet by mouth at bedtime.    Yes Historical Provider, MD  collagenase (SANTYL) ointment Apply topically daily. Apply daily on right heel, as part of pressure ulcer care. 02/10/16  Yes Barton Dubois, MD  diltiazem (CARDIZEM CD) 240 MG 24 hr capsule Take 240 mg by mouth daily. Take with a 120 mg capsule for a 360 mg dose   Yes Historical Provider, MD  diltiazem (DILACOR XR) 120 MG 24 hr capsule Take 120 mg by mouth daily. Take with a 240 mg capsule for a 360 mg dose   Yes Historical Provider, MD  feeding supplement (ENSURE) PUDG Take 1 Container by mouth 3 (three) times daily between meals. Patient taking differently: Take 1 Container by mouth See admin instructions. Offer as afternoon and bedtime snack 08/23/12  Yes Shon Baton, MD  fentaNYL (DURAGESIC - DOSED MCG/HR) 50 MCG/HR Place 1 patch (50 mcg total) onto the skin every 3 (three) days. REMOVE OLD PATCH NOTE SITE EXTERNAL USE ONLY 02/11/16  Yes Tiffany L Reed, DO  Fluticasone-Salmeterol (ADVAIR) 250-50 MCG/DOSE AEPB Inhale 1 puff into the lungs 2 (two) times daily. Rinse mouth after inhalation and spit out   Yes Historical Provider, MD  furosemide (LASIX) 40 MG tablet Take 1 tablet (40 mg total) by mouth 2 (two) times daily. 07/12/15  Yes Nita Sells, MD  gabapentin (NEURONTIN) 300 MG capsule Take 1 capsule (300 mg total) by mouth 2 (two) to 3 (three)  times daily. Patient taking differently: Take 300 mg by mouth 3 (three) times daily. 10am, 4pm, 10pm 09/02/14  Yes Shon Baton, MD  Hypromellose (ARTIFICIAL TEARS OP) Place 2 drops into both eyes 4 (four) times daily as needed (dry eyes).   Yes Historical Provider, MD  ipratropium-albuterol (DUONEB) 0.5-2.5 (3) MG/3ML SOLN  Take 3 mLs by nebulization every 6 (six) hours as needed (shortness of breath).    Yes Historical Provider, MD  levothyroxine (SYNTHROID, LEVOTHROID) 112 MCG tablet Take 112 mcg by mouth daily before breakfast.    Yes Historical Provider, MD  linagliptin (TRADJENTA) 5 MG TABS tablet Take 1 tablet (5 mg total) by mouth daily. 10/09/14  Yes Shon Baton, MD  LORazepam (ATIVAN) 0.5 MG tablet Take 1 tablet (0.5 mg total) by mouth 3 (three) times daily. 8am, 12pm, 8pm 02/10/16  Yes Barton Dubois, MD  magnesium hydroxide (MILK  OF MAGNESIA) 400 MG/5ML suspension Take 30 mLs by mouth daily as needed for mild constipation.    Yes Historical Provider, MD  Melatonin-Pyridoxine (MELATIN PO) Take 1 tablet by mouth at bedtime.    Yes Historical Provider, MD  Multiple Vitamin (MULTIVITAMIN WITH MINERALS) TABS tablet Take 1 tablet by mouth daily.   Yes Historical Provider, MD  omeprazole (PRILOSEC) 20 MG capsule Take 20 mg by mouth daily before breakfast.    Yes Historical Provider, MD  oxycodone (OXY-IR) 5 MG capsule Take 1 capsule (5 mg total) by mouth every 8 (eight) hours as needed for pain. 02/10/16  Yes Barton Dubois, MD  polyethylene glycol [facility] / GLYCOLAX) packet Take 17 g by mouth daily. 01/19/16  Yes Christina P Rama, MD  QUEtiapine (SEROQUEL) 25 MG tablet Take 1 tablet (25 mg total) by mouth at bedtime. 01/19/16  Yes Christina P Rama, MD  sertraline (ZOLOFT) 100 MG tablet Take 100 mg by mouth at bedtime.    Yes Historical Provider, MD  simvastatin (ZOCOR) 10 MG tablet Take 10 mg by mouth at bedtime.    Yes Historical Provider, MD  amoxicillin-clavulanate (AUGMENTIN) 500-125 MG tablet Take 1 tablet by mouth every 12 (twelve) hours. ABT Start Date 04/01/16 & End Date 04/08/16.    Historical Provider, MD  potassium chloride SA (K-DUR,KLOR-CON) 20 MEQ tablet Take 1 tablet (20 mEq total) by mouth 2 (two) times daily. Patient not taking: Reported on 04/14/2016 01/19/16   Venetia Maxon Rama, MD   BP 107/68 mmHg  Pulse  79  Temp(Src) 95.7 F (35.4 C) (Rectal)  Resp 16  SpO2 100% Physical Exam  Constitutional: He is oriented to person, place, and time. He appears well-developed.  HENT:  Head: Normocephalic and atraumatic.  Eyes: Conjunctivae and EOM are normal. Pupils are equal, round, and reactive to light.  Neck: Normal range of motion. Neck supple. JVD present.  Cardiovascular: Normal rate and regular rhythm.   Murmur heard. Pulmonary/Chest: Effort normal. No respiratory distress. He has wheezes. He has rales.  Increased resp distress. Unable to complete full sentences. + accessory muscle use, and abd retractions.  Abdominal: Soft. Bowel sounds are normal. He exhibits no distension. There is no tenderness. There is no rebound and no guarding.  Musculoskeletal: He exhibits edema.  3+ pitting edema  Neurological: He is alert and oriented to person, place, and time.  Skin: Skin is warm.  Nursing note and vitals reviewed.   ED Course  .Critical Care Performed by: Varney Biles Authorized by: Varney Biles Total critical care time: 70 minutes Critical care time was exclusive of separately billable procedures and treating other patients. Critical care was necessary to treat or prevent imminent or life-threatening deterioration of the following conditions: cardiac failure, circulatory failure, sepsis and shock. Critical care was time spent personally by me on the following activities: blood draw for specimens, development of treatment plan with patient or surrogate, discussions with consultants, interpretation of cardiac output measurements, evaluation of patient's response to treatment, examination of patient, obtaining history from patient or surrogate, ordering and review of radiographic studies, ordering and review of laboratory studies, ordering and performing treatments and interventions, pulse oximetry, re-evaluation of patient's condition, review of old charts and ventilator  management. Subsequent provider of critical care: I assumed direction of critical care for this patient from another provider of my specialty.   INTUBATION Performed by: Varney Biles  Required items: required blood products, implants, devices, and special equipment available Patient identity confirmed: provided demographic data and hospital-assigned  identification number Time out: Immediately prior to procedure a "time out" was called to verify the correct patient, procedure, equipment, support staff and site/side marked as required.  Indications: Respiratory distress  Intubation method: Glidescope Laryngoscopy   Preoxygenation: BVM  Sedatives: Etomidate Paralytic: Succinylcholine  Tube Size: 7.5 cuffed  Post-procedure assessment: chest rise and ETCO2 monitor Breath sounds: equal and absent over the epigastrium Tube secured with: ETT holder Chest x-ray interpreted by radiologist and me.  Chest x-ray findings: endotracheal tube in appropriate position  Patient tolerated the procedure well with no immediate complications.     CENTRAL LINE Performed by: Varney Biles Consent: The procedure was performed in an emergent situation. Required items: required blood products, implants, devices, and special equipment available Patient identity confirmed: arm band and provided demographic data Time out: Immediately prior to procedure a "time out" was called to verify the correct patient, procedure, equipment, support staff and site/side marked as required. Indications: vascular access Anesthesia: local infiltration Local anesthetic: lidocaine 1% with epinephrine Anesthetic total: 3 ml Patient sedated: no Preparation: skin prepped with 2% chlorhexidine Skin prep agent dried: skin prep agent completely dried prior to procedure Sterile barriers: all five maximum sterile barriers used - cap, mask, sterile gown, sterile gloves, and large sterile sheet Hand hygiene: hand hygiene  performed prior to central venous catheter insertion  Location details: Right internal jugular  Catheter type: triple lumen Catheter size: 8 Fr Pre-procedure: landmarks identified Ultrasound guidance: YES Successful placement: yes Post-procedure: line sutured and dressing applied Assessment: blood return through all parts, free fluid flow, placement verified by x-ray and no pneumothorax on x-ray Patient tolerance: Patient tolerated the procedure well with no immediate complications.     (including critical care time) Labs Review Labs Reviewed  BLOOD GAS, ARTERIAL - Abnormal; Notable for the following:    pCO2 arterial 33.2 (*)    pO2, Arterial 50.7 (*)    Bicarbonate 18.7 (*)    Acid-base deficit 5.6 (*)    All other components within normal limits  CBC WITH DIFFERENTIAL/PLATELET - Abnormal; Notable for the following:    WBC 33.2 (*)    RBC 3.76 (*)    Hemoglobin 9.5 (*)    HCT 29.2 (*)    MCV 77.7 (*)    MCH 25.3 (*)    RDW 16.7 (*)    Neutro Abs 30.9 (*)    Monocytes Absolute 1.1 (*)    All other components within normal limits  BASIC METABOLIC PANEL - Abnormal; Notable for the following:    Sodium 131 (*)    Chloride 91 (*)    CO2 20 (*)    Glucose, Bld 155 (*)    BUN 70 (*)    Creatinine, Ser 3.12 (*)    Calcium 6.0 (*)    GFR calc non Af Amer 17 (*)    GFR calc Af Amer 19 (*)    Anion gap 20 (*)    All other components within normal limits  BRAIN NATRIURETIC PEPTIDE - Abnormal; Notable for the following:    B Natriuretic Peptide 2579.7 (*)    All other components within normal limits  TROPONIN I - Abnormal; Notable for the following:    Troponin I 4.03 (*)    All other components within normal limits  I-STAT CG4 LACTIC ACID, ED - Abnormal; Notable for the following:    Lactic Acid, Venous 4.16 (*)    All other components within normal limits  CULTURE, BLOOD (ROUTINE X 2)  CULTURE, BLOOD (  ROUTINE X 2)  URINE CULTURE  URINALYSIS, ROUTINE W REFLEX  MICROSCOPIC (NOT AT [facility])  TRIGLYCERIDES  BLOOD GAS, ARTERIAL  I-STAT CG4 LACTIC ACID, ED  I-STAT CG4 LACTIC ACID, ED    Imaging Review Dg Chest 2 View  04/17/2016  CLINICAL DATA:  SOB and decreased sats. Pt hx of COPD, CHF, diabetes, HTN. Former smoker. EXAM: CHEST  2 VIEW COMPARISON:  02/10/2016 FINDINGS: Cardiac enlargement with pulmonary vascular congestion. Hazy perihilar infiltrates, likely edema. Small bilateral pleural effusions. Basilar atelectasis or consolidation in both lungs. Findings are progressing since previous study. No pneumothorax. Tortuous and calcified aorta. Degenerative changes in the spine. IMPRESSION: Progressing congestive changes in the heart and lungs since previous study. Electronically Signed   By: Lucienne Capers M.D.   On: 04/02/2016 22:11   I have personally reviewed and evaluated these images and lab results as part of my medical decision-making.   EKG Interpretation   Date/Time:  Monday April 04 2016 01:04:43 EDT Ventricular Rate:  81 PR Interval:  144 QRS Duration: 145 QT Interval:  441 QTC Calculation: 512 R Axis:   -78 Text Interpretation:  Ectopic atrial rhythm Right bundle branch block  Inferior infarct, old Probable anterior infarct, age indeterminate  Nonspecific ST and T wave abnormality anterior t wave inversion - new  Confirmed by Kathrynn Humble, MD, Thelma Comp [phone]) on 04/04/2016 1:54:45 AM      MDM   Final diagnoses:  Acute pulmonary edema (HCC)  Acute respiratory failure with hypoxia (HCC)  NSTEMI (non-ST elevated myocardial infarction) ([place])  Acute combined systolic and diastolic congestive heart failure ([place])  Shock ([place])    Pt comes in with acute hypoxic respiratory failure. Hx of COPD, Aortic Stenosis and Pneumonia. Clinical concerns are high for CHF exacerbation - he has pitting edema, orthopnea, PND. + cough - yellow. He has wheezing, could be cardiac wheez, but also COPD exacerbation. Labs returned with WC of 35K. Pt reassessed  and he is noted to be warm to touch. Lactate > 4. Seems like the symptoms are multifactorial - with Cardiogenic cause from AS more of a cause than infection/pneumonia. Still, code sepsis was called to ensure we were aggressive upfront.  Consulted CCM. They will admit. [facility]. Trop is > 4. BNP is > 2000. CXR shows CHF. Their (Dr. Donald Siva, Cardiology) recs are to get central line and check CVP, if CVP > 12, diurese.  Pt unable to lay flat for central line. His resp distress was getting worse, so decision was made to intubate. Pt consented. Discussed with wife. Post intubation and central line CVP is 6. Will transfuse fluids.     Varney Biles, MD 04/04/16 [id number]  Varney Biles, MD 04/04/16 [id number]

## 2016-04-04 NOTE — ED Notes (Signed)
Patient intubated.  22 at the lip.  7.5 tube

## 2016-04-04 NOTE — ED Notes (Signed)
CVP monitoring started.

## 2016-04-04 NOTE — Progress Notes (Signed)
ANTICOAGULATION CONSULT NOTE - Initial Consult  Pharmacy Consult for Heparin Indication: chest pain/ACS  No Known Allergies  Patient Measurements: Height: 6\' 1"  (185.4 cm) IBW/kg (Calculated) : 79.9 Heparin Dosing Weight:   Vital Signs: Temp: 95.7 F (35.4 C) (04/17 0200) Temp Source: Rectal (04/16 2055) BP: 107/68 mmHg (04/17 0200) Pulse Rate: 79 (04/17 0200)  Labs:  Recent Labs  03/27/2016 2151  HGB 9.5*  HCT 29.2*  PLT 398  CREATININE 3.12*  TROPONINI 4.03*    Estimated Creatinine Clearance: 18.9 mL/min (by C-G formula based on Cr of 3.12).   Medical History: Past Medical History  Diagnosis Date  . Hyperlipidemia   . COPD (chronic obstructive pulmonary disease) ([place])   . Leg pain   . GERD (gastroesophageal reflux disease)   . Depression   . Hypothyroidism   . Peripheral vascular disease ([place])     a. noninvasive studies suggesting mod-severe arterial insufficiency confirmed by prior PV angiograms, followed by Dr. Donnetta Hutching).  . ED (erectile dysfunction)   . Hypertension     08/17/12 - wife denies  . H/O hiatal hernia   . Migraine     "when I was a young boy"  . Arthritis     "hands" (08/28/2014)  . Anxiety   . Chronic kidney disease (CKD), stage III (moderate)     [name] 08/28/2014  . Diabetes mellitus with neuropathy ([place])   . Bipolar disorder ([place])   . Aortic stenosis     a. Mod-severe by echo.  . Chronic venous insufficiency   . NSVT (nonsustained ventricular tachycardia) ([place])     a. 12/2015 in setting of low Mg.  . Multifocal atrial tachycardia ([place])   . Renal artery stenosis (HCC)     a. s/p prior L renal artery stent.  Marland Kitchen RBBB   . Chronic diastolic CHF (congestive heart failure) (HCC)     Medications:  Infusions:  . ceFEPime (MAXIPIME) IV Stopped (03/30/2016 2326)  . heparin    . propofol (DIPRIVAN) infusion 5 mcg/kg/min (04/04/16 0012)  . sodium chloride      Assessment: Patient with SOB in ED.  + troponin noted.  MD wants pharmacy to dose  for ACS.  Goal of Therapy:  Heparin level 0.3-0.7 units/ml Monitor platelets by anticoagulation protocol: Yes   Plan:  Heparin bolus 3500 units iv x1 Heparin drip at  1000 units/hr Daily CBC Next heparin level at [place] 04/04/2016,2:23 AM

## 2016-04-04 NOTE — Progress Notes (Signed)
ANTICOAGULATION CONSULT NOTE - Follow-Up Consult  Pharmacy Consult for Heparin Indication: chest pain/ACS  No Known Allergies  Patient Measurements: Height: 6\' 1"  (185.4 cm) Weight: 182 lb 1.6 oz (82.6 kg) IBW/kg (Calculated) : 79.9 Heparin Dosing Weight: 82 kg  Vital Signs: Temp: 97.9 F (36.6 C) (04/17 2100) Temp Source: Core (Comment) (04/17 2000) BP: 87/49 mmHg (04/17 2100) Pulse Rate: 85 (04/17 2100)  Labs:  Recent Labs  03/24/2016 2151 04/04/16 0208 04/04/16 0517 04/04/16 1125 04/04/16 1421 04/04/16 2116  HGB 9.5* 9.0* 8.7*  --   --   --   HCT 29.2* 27.5* 25.9*  --   --   --   PLT 398 321 288  --   --   --   HEPARINUNFRC  --   --   --  0.11*  --  0.21*  CREATININE 3.12* 3.23* 3.11*  --   --   --   TROPONINI 4.03*  --   --  3.12* 3.66*  --     Estimated Creatinine Clearance: 19.6 mL/min (by C-G formula based on Cr of 3.11).   Medical History: Past Medical History  Diagnosis Date  . Hyperlipidemia   . COPD (chronic obstructive pulmonary disease) ([place])   . Leg pain   . GERD (gastroesophageal reflux disease)   . Depression   . Hypothyroidism   . Peripheral vascular disease ([place])     a. noninvasive studies suggesting mod-severe arterial insufficiency confirmed by prior PV angiograms, followed by Dr. Donnetta Hutching).  . ED (erectile dysfunction)   . Hypertension     08/17/12 - wife denies  . H/O hiatal hernia   . Migraine     "when I was a young boy"  . Arthritis     "hands" (08/28/2014)  . Anxiety   . Chronic kidney disease (CKD), stage III (moderate)     [name] 08/28/2014  . Diabetes mellitus with neuropathy ([place])   . Bipolar disorder ([place])   . Aortic stenosis     a. Mod-severe by echo.  . Chronic venous insufficiency   . NSVT (nonsustained ventricular tachycardia) ([place])     a. 12/2015 in setting of low Mg.  . Multifocal atrial tachycardia ([place])   . Renal artery stenosis (HCC)     a. s/p prior L renal artery stent.  Marland Kitchen RBBB   . Chronic diastolic CHF  (congestive heart failure) (HCC)     Medications:  Infusions:  . feeding supplement (VITAL AF 1.2 CAL)    . fentaNYL infusion INTRAVENOUS 150 mcg/hr (04/04/16 2119)  . heparin      Assessment: Patient's an 82 y.o M presented to the ED on 4/17 with SOB.  He was found to have positive cardiac enzymes.  Heparin drip started for r/o ACS.  Today, 04/04/2016: - Heparin level @ 2116 = 0.21, subtherapeutic despite bolus/rate increase this afternoon - No issues with IV line per RN - CBC: Hgb low, but relatively stable. Pltc WNL. - No bleeding reported/documented per nursing  Goal of Therapy:  Heparin level 0.3-0.7 units/ml Monitor platelets by anticoagulation protocol: Yes   Plan:  - Heparin bolus 1000 units IV x1, then increase heparin infusion to 1400 units/hr - Check 8 hour heparin level - Daily CBC, heparin level while on heparin infusion - Monitor for s/s of bleeding  Lindell Spar, PharmD, BCPS Pager: [phone] 04/04/2016 9:57 PM

## 2016-04-04 NOTE — Care Management Note (Signed)
Case Management Note  Patient Details  Name: [name] MRN: [id number] Date of Birth: [birth year]  Subjective/Objective:              Hypothermia, hypoxia, sepsis      Action/Plan:Date:  April 04, 2016 Chart reviewed for concurrent status and case management needs. Will continue to follow patient for changes and needs: Velva Harman, BSN, RN, [place]   [phone]   Expected Discharge Date:                  Expected Discharge Plan:  Home/Self Care  In-House Referral:  NA  Discharge planning Services  CM Consult  Post Acute Care Choice:  NA Choice offered to:  NA  DME Arranged:    DME Agency:     HH Arranged:    HH Agency:     Status of Service:  In process, will continue to follow  Medicare Important Message Given:    Date Medicare IM Given:    Medicare IM give by:    Date Additional Medicare IM Given:    Additional Medicare Important Message give by:     If discussed at [place] of Stay Meetings, dates discussed:    Additional Comments:  Leeroy Cha, RN 04/04/2016, 9:45 AM

## 2016-04-04 NOTE — ED Notes (Signed)
CVP = 39. MD notified

## 2016-04-04 NOTE — ED Notes (Signed)
OG tube placed  

## 2016-04-04 NOTE — Progress Notes (Signed)
CRITICAL VALUE ALERT  Critical value received:  Troponin 3.66  Date of notification:  04/04/16  Time of notification:  [id number]  Critical value read back:Yes.    Nurse who received alert:  Kennis Carina, RN  MD notified (1st page):  E-Link, Dr. Emmit Alexanders  Time of first page:  1640  MD notified (2nd page):  Time of second page:  Responding MD:  Dr. Emmit Alexanders  Time MD responded:  1640

## 2016-04-04 NOTE — H&P (Signed)
PULMONARY / CRITICAL CARE MEDICINE   Name: [name] MRN: [id number] DOB: [birth year]    ADMISSION DATE:  04/17/2016 CONSULTATION DATE:  03/30/2016  REFERRING MD:  EDP  CHIEF COMPLAINT:  Shortness of breath  HISTORY OF PRESENT ILLNESS:   Mr. Hartt is an 8M brought by EMS to the ED from his nursing home with complaints of shortness of breath. He apparently has also been complaining of atypical chest pain. He was diagnosed with an aspiration pneumonia and started on Augmentin on 4/14. His shrtness has been progressive. He required 4L O2 initially. After arrival, he was initially treated as code sepsis, then after obtaining a CVP of 32, the decision was made to begin diuresis. His past medical history is significant for chronic heart failure with chronic lower extremity edema and non-healing ulcers on bilateral heels.  He is unable to provide any additional history and his wife is unavailable.   PAST MEDICAL HISTORY :  He  has a past medical history of Hyperlipidemia; COPD (chronic obstructive pulmonary disease) ([place]); Leg pain; GERD (gastroesophageal reflux disease); Depression; Hypothyroidism; Peripheral vascular disease [facility]); ED (erectile dysfunction); Hypertension; H/O hiatal hernia; Migraine; Arthritis; Anxiety; Chronic kidney disease (CKD), stage III (moderate); Diabetes mellitus with neuropathy ([place]); Bipolar disorder ([place]); Aortic stenosis; Chronic venous insufficiency; NSVT (nonsustained ventricular tachycardia) ([place]); Multifocal atrial tachycardia ([place]); Renal artery stenosis ([place]); RBBB; and Chronic diastolic CHF (congestive heart failure) ([place]).  PAST SURGICAL HISTORY: He  has past surgical history that includes Renal artery stent (Left); Kidney stone surgery (1989); Lower extremity angiogram (Left, 03-20-2014); Tonsillectomy; Cataract extraction, bilateral (Bilateral); Blepharoplasty (Right); abdominal aortagram (N/A, 03/20/2013); and lower extremity angiogram (Left,  03/20/2013).  No Known Allergies  No current facility-administered medications on file prior to encounter.   Current Outpatient Prescriptions on File Prior to Encounter  Medication Sig  . acetaminophen (TYLENOL) 325 MG tablet Take 650 mg by mouth every 6 (six) hours as needed for mild pain, moderate pain, fever or headache.   Marland Kitchen aspirin EC 81 MG tablet Take 81 mg by mouth daily.   . bisacodyl (DULCOLAX) 10 MG suppository Place 1 suppository (10 mg total) rectally once. (Patient taking differently: Place 10 mg rectally daily as needed for moderate constipation. )  . carbidopa-levodopa (SINEMET) 10-100 MG per tablet Take 1 tablet by mouth at bedtime.   . collagenase (SANTYL) ointment Apply topically daily. Apply daily on right heel, as part of pressure ulcer care.  . diltiazem (CARDIZEM CD) 240 MG 24 hr capsule Take 240 mg by mouth daily. Take with a 120 mg capsule for a 360 mg dose  . diltiazem (DILACOR XR) 120 MG 24 hr capsule Take 120 mg by mouth daily. Take with a 240 mg capsule for a 360 mg dose  . feeding supplement (ENSURE) PUDG Take 1 Container by mouth 3 (three) times daily between meals. (Patient taking differently: Take 1 Container by mouth See admin instructions. Offer as afternoon and bedtime snack)  . fentaNYL (DURAGESIC - DOSED MCG/HR) 50 MCG/HR Place 1 patch (50 mcg total) onto the skin every 3 (three) days. REMOVE OLD PATCH NOTE SITE EXTERNAL USE ONLY  . Fluticasone-Salmeterol (ADVAIR) 250-50 MCG/DOSE AEPB Inhale 1 puff into the lungs 2 (two) times daily. Rinse mouth after inhalation and spit out  . furosemide (LASIX) 40 MG tablet Take 1 tablet (40 mg total) by mouth 2 (two) times daily.  Marland Kitchen gabapentin (NEURONTIN) 300 MG capsule Take 1 capsule (300 mg total) by mouth 2 (two) to 3 (three)  times daily. (Patient taking differently: Take 300 mg by mouth 3 (three) times daily. 10am, 4pm, 10pm)  . ipratropium-albuterol (DUONEB) 0.5-2.5 (3) MG/3ML SOLN Take 3 mLs by nebulization every 6  (six) hours as needed (shortness of breath).   Marland Kitchen levothyroxine (SYNTHROID, LEVOTHROID) 112 MCG tablet Take 112 mcg by mouth daily before breakfast.   . linagliptin (TRADJENTA) 5 MG TABS tablet Take 1 tablet (5 mg total) by mouth daily.  Marland Kitchen LORazepam (ATIVAN) 0.5 MG tablet Take 1 tablet (0.5 mg total) by mouth 3 (three) times daily. 8am, 12pm, 8pm  . Melatonin-Pyridoxine (MELATIN PO) Take 1 tablet by mouth at bedtime.   . Multiple Vitamin (MULTIVITAMIN WITH MINERALS) TABS tablet Take 1 tablet by mouth daily.  Marland Kitchen omeprazole (PRILOSEC) 20 MG capsule Take 20 mg by mouth daily before breakfast.   . oxycodone (OXY-IR) 5 MG capsule Take 1 capsule (5 mg total) by mouth every 8 (eight) hours as needed for pain.  . polyethylene glycol (MIRALAX / GLYCOLAX) packet Take 17 g by mouth daily.  . QUEtiapine (SEROQUEL) 25 MG tablet Take 1 tablet (25 mg total) by mouth at bedtime.  . sertraline (ZOLOFT) 100 MG tablet Take 100 mg by mouth at bedtime.   . simvastatin (ZOCOR) 10 MG tablet Take 10 mg by mouth at bedtime.   . potassium chloride SA (K-DUR,KLOR-CON) 20 MEQ tablet Take 1 tablet (20 mEq total) by mouth 2 (two) times daily. (Patient not taking: Reported on 04/13/2016)    FAMILY HISTORY:  His indicated that his mother is deceased. He indicated that his father is deceased. He indicated that his brother is alive.   SOCIAL HISTORY: He  reports that he quit smoking about 3 years ago. His smoking use included Cigarettes. He has a 25 pack-year smoking history. He has never used smokeless tobacco. He reports that he drinks alcohol. He reports that he does not use illicit drugs.  REVIEW OF SYSTEMS:   Unable to obtain 2/2 intubation  SUBJECTIVE:   VITAL SIGNS: BP 99/62 mmHg  Pulse 84  Temp(Src) 97.5 F (36.4 C) (Rectal)  Resp 16  SpO2 95%  HEMODYNAMICS:    VENTILATOR SETTINGS: Vent Mode:  [-] PRVC FiO2 (%):  [100 %] 100 % Set Rate:  [14 bmp] 14 bmp Vt Set:  [490 mL-620 mL] 620 mL PEEP:  [5 cmH20] 5  cmH20 Plateau Pressure:  [18 cmH20] 18 cmH20  INTAKE / OUTPUT:    PHYSICAL EXAMINATION:  General Well developed, thin, intubated, sedated, arouses to noxious stimuli  HEENT No gross abnormalities. ETT in place  Pulmonary Ronchorous bilaterally with no wheezes. Vent-assisted effort, symmetrical expansion.   Cardiovascular Normal rate, regular rhythm. S1, s2. II/VI SEM best at RUSB. No r/g. Distal pulses palpable.  Abdomen Soft, non-tender, non-distended, positive bowel sounds, no palpable organomegaly or masses. Normoresonant to percussion.  Musculoskeletal No bony abnormalities.   Lymphatics No cervical, supraclavicular or axillary adenopathy.   Neurologic Arouses to noxious stimuli and moves all extremities. Pupils equal, reactive  Skin/Integuement Bilateral heel ulcers - foul smelling, approximately 4x4cm left and 3x4 cm right. No purulent drainage. Minimal surrounding erythema. No rash, no cyanosis, no clubbing.     LABS:  BMET  Recent Labs Lab 03/20/2016 2151  NA 131*  K 4.8  CL 91*  CO2 20*  BUN 70*  CREATININE 3.12*  GLUCOSE 155*    Electrolytes  Recent Labs Lab 04/05/2016 2151  CALCIUM 6.0*    CBC  Recent Labs Lab 04/05/2016 2151  WBC 33.2*  HGB 9.5*  HCT 29.2*  PLT 398    Coag's No results for input(s): APTT, INR in the last 168 hours.  Sepsis Markers  Recent Labs Lab 03/22/2016 2237  LATICACIDVEN 4.16*    ABG  Recent Labs Lab 04/11/2016 2043  PHART 7.367  PCO2ART 33.2*  PO2ART 50.7*    Liver Enzymes No results for input(s): AST, ALT, ALKPHOS, BILITOT, ALBUMIN in the last 168 hours.  Cardiac Enzymes  Recent Labs Lab 03/22/2016 2151  TROPONINI 4.03*    Glucose No results for input(s): GLUCAP in the last 168 hours.  Imaging Dg Chest 2 View  03/25/2016  CLINICAL DATA:  SOB and decreased sats. Pt hx of COPD, CHF, diabetes, HTN. Former smoker. EXAM: CHEST  2 VIEW COMPARISON:  02/10/2016 FINDINGS: Cardiac enlargement with pulmonary  vascular congestion. Hazy perihilar infiltrates, likely edema. Small bilateral pleural effusions. Basilar atelectasis or consolidation in both lungs. Findings are progressing since previous study. No pneumothorax. Tortuous and calcified aorta. Degenerative changes in the spine. IMPRESSION: Progressing congestive changes in the heart and lungs since previous study. Electronically Signed   By: Lucienne Capers M.D.   On: 03/19/2016 22:11     STUDIES:  CXR 4/16 >> bilateral pulmonary edema, b/l pleural effusions, basilar atelectasis vs pneumonia  CULTURES: Blood 4/16  ANTIBIOTICS: Vanc 4/16 >> Cefepime 4/16 >>  SIGNIFICANT EVENTS: Intubation for respiratory distress  LINES/TUBES: RIJ 4/16 >> ETT 4/16 >> Foley 4/16 >>  DISCUSSION: Mr. Vieyra is an 16M with acute on chronic diastolic CHF (last EF 123456 in Feb 2017), moderate-severe AS, and possible sepsis secondary to pneumonia vs skin and soft tissue infection (bilateral heels). UA benign, no other pertinent history to suggest other source. He has an AKI and troponin leak. His BNP is markedly elevated. He also has a history of HTN, HLD, diabetes, hypothyroidism, COPD on Advair/DuoNebs and peripheral arterial disease.  ASSESSMENT / PLAN:  PULMONARY A: Acute hypoxemic respiratory failure - likely secondary to dCHF exacerbation and volume overload, although sepsis 2/2 pneumonia cannot be excluded Hx COPD P:   Continue ventilatory support Wean vent as tolerated Diurese with goal net negative 1-2L daily as BP and renal function tolerate Empiric antibiotics with vanc/cefepime PRN DuoNebs while intubated - once extubated resume home regimen  CARDIOVASCULAR A:  Troponin leak / NSTEMI Acute on chronic dCHF exacerbation Hx mod-severe AS P:  Diurese Trend troponin Agree with empiric heparin for ACS/NSTEMI for now Follow EKG Repeat TTE  ASA/Statin Hold antihypertensives  RENAL A:   Acute kidney injury (Cr 3 from baseline  ~1.6) Lactic acidosis Hyponatremia - likely hypervolemic hyponatremia Hypocalcemia?  P:   S/p 1L IVF, currently volume up Diurese - renal function may improve Trend lactate Check hepatic function to correct Ca++ for albumin  GASTROINTESTINAL A:   Nothing acute P:   GI ppx NPO for now  HEMATOLOGIC A:   Leukocytosis w/ left shift Chronic anemia P:  Trend CBC  INFECTIOUS A:   Sepsis 2/2 pneumonia vs SSTI vs other P:   Empiric antibiotics with vanc, cefepime Follow cultures Wound care for feet  ENDOCRINE A:   Hx diabetes Hx hypothyroidism P:   SSI Continue levothyroxine  NEUROLOGIC A:   Parkinsonian features and probable agitation at baseline P:   RASS goal: 0 to -1 Continue home Sinemet Continue home Seroquel   FAMILY  - Updates: Wife unavailable. Per ED provider - she and he desired intubation and he is currently a FULL CODE. This should be revisited tomorrow.   -  Inter-disciplinary family meet or Palliative Care meeting due by:  day 7  The patient is critically ill with multiple organ system failure and requires high complexity decision making for assessment and support, frequent evaluation and titration of therapies, advanced monitoring, review of radiographic studies and interpretation of complex data.   Critical Care Time devoted to patient care services, exclusive of separately billable procedures, described in this note is 48 minutes.   Yisroel Ramming, MD Pulmonary and [place] Pager: [phone]  04/04/2016, 1:41 AM

## 2016-04-05 ENCOUNTER — Inpatient Hospital Stay (HOSPITAL_COMMUNITY): Payer: Medicare Other

## 2016-04-05 DIAGNOSIS — I5021 Acute systolic (congestive) heart failure: Secondary | ICD-10-CM

## 2016-04-05 DIAGNOSIS — I350 Nonrheumatic aortic (valve) stenosis: Secondary | ICD-10-CM

## 2016-04-05 DIAGNOSIS — J9601 Acute respiratory failure with hypoxia: Secondary | ICD-10-CM

## 2016-04-05 DIAGNOSIS — I5033 Acute on chronic diastolic (congestive) heart failure: Secondary | ICD-10-CM

## 2016-04-05 LAB — COMPREHENSIVE METABOLIC PANEL
ALK PHOS: 74 U/L (ref 38–126)
ALT: 192 U/L — ABNORMAL HIGH (ref 17–63)
ANION GAP: 16 — AB (ref 5–15)
AST: 313 U/L — ABNORMAL HIGH (ref 15–41)
Albumin: 2.2 g/dL — ABNORMAL LOW (ref 3.5–5.0)
BUN: 79 mg/dL — ABNORMAL HIGH (ref 6–20)
CALCIUM: 5.5 mg/dL — AB (ref 8.9–10.3)
CO2: 22 mmol/L (ref 22–32)
Chloride: 95 mmol/L — ABNORMAL LOW (ref 101–111)
Creatinine, Ser: 3.31 mg/dL — ABNORMAL HIGH (ref 0.61–1.24)
GFR, EST AFRICAN AMERICAN: 18 mL/min — AB (ref >60)
GFR, EST NON AFRICAN AMERICAN: 16 mL/min — AB (ref >60)
Glucose, Bld: 121 mg/dL — ABNORMAL HIGH (ref 65–99)
Potassium: 4.9 mmol/L (ref 3.5–5.1)
SODIUM: 133 mmol/L — AB (ref 135–145)
Total Bilirubin: 0.8 mg/dL (ref 0.3–1.2)
Total Protein: 5.9 g/dL — ABNORMAL LOW (ref 6.5–8.1)

## 2016-04-05 LAB — CBC
HCT: 27.4 % — ABNORMAL LOW (ref 39.0–52.0)
HEMOGLOBIN: 8.9 g/dL — AB (ref 13.0–17.0)
MCH: 25.4 pg — AB (ref 26.0–34.0)
MCHC: 32.5 g/dL (ref 30.0–36.0)
MCV: 78.1 fL (ref 78.0–100.0)
PLATELETS: 299 10*3/uL (ref 150–400)
RBC: 3.51 MIL/uL — ABNORMAL LOW (ref 4.22–5.81)
RDW: 17.4 % — ABNORMAL HIGH (ref 11.5–15.5)
WBC: 26.9 10*3/uL — ABNORMAL HIGH (ref 4.0–10.5)

## 2016-04-05 LAB — URINE CULTURE: CULTURE: NO GROWTH

## 2016-04-05 LAB — GLUCOSE, CAPILLARY
GLUCOSE-CAPILLARY: 120 mg/dL — AB (ref 65–99)
GLUCOSE-CAPILLARY: 132 mg/dL — AB (ref 65–99)
Glucose-Capillary: 97 mg/dL (ref 65–99)
Glucose-Capillary: 105 mg/dL — ABNORMAL HIGH (ref 65–99)
Glucose-Capillary: 100 mg/dL — ABNORMAL HIGH (ref 65–99)
Glucose-Capillary: 83 mg/dL (ref 65–99)

## 2016-04-05 LAB — HEPARIN LEVEL (UNFRACTIONATED)
HEPARIN UNFRACTIONATED: 0.29 [IU]/mL — AB (ref 0.30–0.70)
HEPARIN UNFRACTIONATED: 0.35 [IU]/mL (ref 0.30–0.70)

## 2016-04-05 LAB — TROPONIN I
Troponin I: 2.32 ng/mL (ref <0.031)
Troponin I: 2.19 ng/mL (ref <0.031)

## 2016-04-05 LAB — PROCALCITONIN: Procalcitonin: 1.99 ng/mL

## 2016-04-05 LAB — MAGNESIUM: Magnesium: 1.2 mg/dL — ABNORMAL LOW (ref 1.7–2.4)

## 2016-04-05 MED ORDER — FUROSEMIDE 10 MG/ML IJ SOLN
4.0000 mg/h | INTRAMUSCULAR | Status: DC
  Administered 2016-04-05: 4 mg/h via INTRAVENOUS
  Filled 2016-04-05: qty 21

## 2016-04-05 MED ORDER — MUPIROCIN 2 % EX OINT
1.0000 "application " | TOPICAL_OINTMENT | Freq: Two times a day (BID) | CUTANEOUS | Status: DC
  Administered 2016-04-05: 1 via NASAL
  Filled 2016-04-05: qty 22

## 2016-04-05 MED ORDER — LEVOTHYROXINE SODIUM 100 MCG IV SOLR
56.0000 ug | Freq: Every day | INTRAVENOUS | Status: DC
Start: 2016-04-05 — End: 2016-04-06
  Administered 2016-04-05: 56 ug via INTRAVENOUS
  Filled 2016-04-05: qty 5

## 2016-04-05 MED ORDER — SODIUM CHLORIDE 0.9 % IV SOLN
2.0000 g | Freq: Once | INTRAVENOUS | Status: AC
  Administered 2016-04-05: 2 g via INTRAVENOUS
  Filled 2016-04-05: qty 20

## 2016-04-05 MED ORDER — MAGNESIUM SULFATE 2 GM/50ML IV SOLN
2.0000 g | Freq: Once | INTRAVENOUS | Status: AC
  Administered 2016-04-05: 2 g via INTRAVENOUS
  Filled 2016-04-05: qty 50

## 2016-04-05 MED ORDER — CHLORHEXIDINE GLUCONATE CLOTH 2 % EX PADS
6.0000 | MEDICATED_PAD | Freq: Every day | CUTANEOUS | Status: DC
  Administered 2016-04-06: 6 via TOPICAL

## 2016-04-05 NOTE — Progress Notes (Signed)
[facility] Progress Note Patient Name: [name] DOB: 1929-11-06 MRN: [id number]   Date of Service  04/05/2016  HPI/Events of Note  Ca 5.5, corrects to 6.9  eICU Interventions  Repletion ordered.     Intervention Category Intermediate Interventions: Electrolyte abnormality - evaluation and management  Jovi Zavadil 04/05/2016, 6:39 AM

## 2016-04-05 NOTE — NC FL2 (Deleted)
[place] LEVEL OF CARE SCREENING TOOL     IDENTIFICATION  Patient Name: [name] Birthdate: August 07, 1929 Sex: male Admission Date (Current Location): 03/23/2016  [facility] and [place] Number:  Herbalist and Address:  [facility],  [place] [address] [place]      Provider Number: [id number]  Attending Physician Name and Address:  Juanito Doom, MD  Relative Name and Phone Number:       Current Level of Care: SNF Recommended Level of Care: [place] Prior Approval Number:    Date Approved/Denied:   PASRR Number:    Discharge Plan: SNF    Current Diagnoses: Patient Active Problem List   Diagnosis Date Noted  . Acute hypoxemic respiratory failure ([place]) 04/04/2016  . Acute on chronic diastolic CHF (congestive heart failure), NYHA class 4 ([place]) 04/04/2016  . Ectopic atrial tachycardia ([place]) 04/04/2016  . Encounter for family conference with patient present 02/20/2016  . Pressure ulcer 02/10/2016  . Pain in the chest   . Chronic renal failure, stage 3 (moderate)   . Benign essential HTN   . Parkinson disease ([place])   . Anemia of chronic disease   . Pneumonia 02/06/2016  . Wounds, multiple open, lower extremity 01/23/2016  . Nonsustained ventricular tachycardia ([place]) 01/19/2016  . MRSA pneumonia ([place]) 01/19/2016  . Diabetic neuropathy ([place]) 01/18/2016  . Chronic venous insufficiency 01/18/2016  . Acute kidney injury ([place]) 01/17/2016  . Hypomagnesemia 01/16/2016  . Altered mental status 01/16/2016  . Stage III chronic kidney disease 01/16/2016  . HCAP (healthcare-associated pneumonia) 01/04/2016  . Hypotension 11/23/2015  . Candida rash of groin 09/23/2015  . Bipolar disorder ([place]) 07/14/2015  . Aortic stenosis 07/09/2015  . Chronic diastolic CHF (congestive heart failure) ([place]) 07/09/2015  . Sepsis due to pneumonia ([place]) 07/08/2015  . Acute respiratory failure with hypoxia ([place]) 07/02/2015  .  Pulmonary edema 07/02/2015  . Leukocytosis 07/02/2015  . Type 2 diabetes mellitus with stage 3 chronic kidney disease ([place]) 07/02/2015  . Chronic osteomyelitis of right foot ([place]) 11/11/2014  . Beta-hemolytic group B streptococcal sepsis ([place]) 11/11/2014  . Bacteremia 11/11/2014  . Atherosclerotic PVD with ulceration ([place]) 10/28/2014  . Hyperlipidemia 10/07/2014  . Obesity 10/07/2014  . Moderate aortic stenosis 10/07/2014  . Multifocal atrial tachycardia ([place]) 10/06/2014  . Sepsis ([place]) 10/05/2014  . Wound of right leg 08/28/2014  . Foot ulcer ([place]) 01/04/2013  . Cellulitis 01/04/2013  . PAD (peripheral artery disease) ([place]) 01/04/2013  . Bilateral lower extremity edema 10/23/2012  . Atherosclerosis of native arteries of the extremities with ulceration ([place]) 10/23/2012  . Aspiration pneumonia ([place]) 08/17/2012  . Toxic encephalopathy 08/17/2012  . COPD with acute exacerbation ([place]) 08/17/2012  . Sacral decubitus ulcer 08/17/2012  . Chronic pain 08/17/2012  . Anxiety disorder 08/17/2012  . Chronic kidney disease, stage III (moderate) 08/17/2012  . Hypothyroidism 02/21/2008  . DYSLIPIDEMIA 02/21/2008  . ANXIETY DEPRESSION 02/21/2008  . RHEUMATIC FEVER 02/21/2008  . Hypertensive heart disease with CHF ([place]) 02/21/2008  . COPD (chronic obstructive pulmonary disease) ([place]) 02/21/2008  . ESOPHAGEAL MOTILITY DISORDER 02/21/2008  . ESOPHAGEAL DIVERTICULUM 02/21/2008  . SLEEP APNEA 02/21/2008  . ATHEROSCLEROSIS 07/11/2007  . INGUINAL HERNIAS, BILATERAL 07/11/2007  . HERNIA, UMBILICAL 09/32/3557  . CHOLELITHIASIS 07/11/2007  . UNSPECIFIED RENAL SCLEROSIS 07/11/2007  . TUBULOVILLOUS ADENOMA, COLON 11/02/2005  . GERD 11/02/2005  . DIVERTICULOSIS OF COLON 11/02/2005  . MELANOSIS COLI 11/02/2005    Orientation RESPIRATION BLADDER Height & Weight  Vent   Weight: 179 lb 0.2 oz (81.2 kg) Height:  _0  (185.4 cm)  BEHAVIORAL SYMPTOMS/MOOD NEUROLOGICAL BOWEL NUTRITION STATUS          (NPO currently)  AMBULATORY STATUS COMMUNICATION OF NEEDS Skin   Total Care                           Personal Care Assistance Level of Assistance  Total care           Functional Limitations Info             SPECIAL CARE FACTORS FREQUENCY                       Contractures Contractures Info: Not present    Additional Factors Info  Code Status (Partial code)               Current Medications (04/05/2016):  This is the current hospital active medication list Current Facility-Administered Medications  Medication Dose Route Frequency Provider Last Rate Last Dose  . 0.9 %  sodium chloride infusion  250 mL Intravenous PRN Dannielle Burn, MD      . albuterol (PROVENTIL) (2.5 MG/3ML) 0.083% nebulizer solution 2.5 mg  2.5 mg Nebulization Q3H PRN Donita Brooks, NP      . ammonium lactate (LAC-HYDRIN) 12 % lotion   Topical Daily Juanito Doom, MD      . antiseptic oral rinse solution (CORINZ)  7 mL Mouth Rinse QID Juanito Doom, MD   7 mL at 04/05/16 0400  . aspirin chewable tablet 324 mg  324 mg Per NG tube Once Varney Biles, MD      . bisacodyl (DULCOLAX) suppository 10 mg  10 mg Rectal Daily PRN Donita Brooks, NP      . ceFEPIme (MAXIPIME) 2 g in dextrose 5 % 50 mL IVPB  2 g Intravenous Q24H Juanito Doom, MD   2 g at 04/04/16 2111  . chlorhexidine gluconate (SAGE KIT) (PERIDEX) 0.12 % solution 15 mL  15 mL Mouth Rinse BID Juanito Doom, MD   15 mL at 04/05/16 0802  . collagenase (SANTYL) ointment   Topical Daily Juanito Doom, MD      . feeding supplement (PRO-STAT SUGAR FREE 64) liquid 30 mL  30 mL Per Tube BID Juanito Doom, MD   30 mL at 04/04/16 1600  . feeding supplement (VITAL AF 1.2 CAL) liquid 1,000 mL  1,000 mL Per Tube Continuous Juanito Doom, MD   1,000 mL at 04/04/16 1600  . fentaNYL (SUBLIMAZE) 2,500 mcg in sodium chloride 0.9 % 250 mL (10 mcg/mL) infusion  25-400 mcg/hr Intravenous Continuous Donita Brooks,  NP 20 mL/hr at 04/05/16 1000 200 mcg/hr at 04/05/16 1000  . fentaNYL (SUBLIMAZE) bolus via infusion 25 mcg  25 mcg Intravenous Q1H PRN Donita Brooks, NP      . fentaNYL (SUBLIMAZE) injection 25 mcg  25 mcg Intravenous Q2H PRN Praveen Mannam, MD      . furosemide (LASIX) 250 mg in dextrose 5 % 250 mL (1 mg/mL) infusion  4 mg/hr Intravenous Continuous Donita Brooks, NP 4 mL/hr at 04/05/16 1000 4 mg/hr at 04/05/16 1000  . heparin ADULT infusion 100 units/mL (25000 units/250 mL)  1,500 Units/hr Intravenous Continuous Juanito Doom, MD 15 mL/hr at 04/05/16 1000 1,500 Units/hr at 04/05/16 1000  . ipratropium-albuterol (DUONEB) 0.5-2.5 (3) MG/3ML nebulizer solution  3 mL  3 mL Nebulization Q6H Dannielle Burn, MD   3 mL at 04/05/16 0844  . levothyroxine (SYNTHROID, LEVOTHROID) injection 56 mcg  56 mcg Intravenous Daily Donita Brooks, NP   56 mcg at 04/05/16 0946  . QUEtiapine (SEROQUEL) tablet 25 mg  25 mg Per Tube QHS Dannielle Burn, MD   25 mg at 04/04/16 2200  . ranitidine (ZANTAC) 150 MG/10ML syrup 150 mg  150 mg Per Tube QHS Dannielle Burn, MD      . simvastatin [facility]) tablet 40 mg  40 mg Oral q1800 Dannielle Burn, MD   40 mg at 04/04/16 1800     Discharge Medications: Please see discharge summary for a list of discharge medications.  Relevant Imaging Results:  Relevant Lab Results:   Additional Information Contact for MRSA  Amador Cunas, [place]

## 2016-04-05 NOTE — Progress Notes (Addendum)
Pharmacist Heart Failure Core Measure Documentation  Assessment: [name] has an EF documented as 25% on 04/04/16 by Echo.  Rationale: Heart failure patients with left ventricular systolic dysfunction (LVSD) and an EF < 40% should be prescribed an angiotensin converting enzyme inhibitor (ACEI) or angiotensin receptor blocker (ARB) at discharge unless a contraindication is documented in the medical record.  This patient is not currently on an ACEI or ARB for HF.  This note is being placed in the record in order to provide documentation that a contraindication to the use of these agents is present for this encounter.  ACE Inhibitor or Angiotensin Receptor Blocker is contraindicated (specify all that apply)  []   ACEI allergy AND ARB allergy []   Angioedema []   Moderate or severe aortic stenosis []   Hyperkalemia []   Hypotension [x]   Renal artery stenosis [x]   Worsening renal function, preexisting renal disease or dysfunction   Lynelle Doctor 04/05/2016 1:13 PM

## 2016-04-05 NOTE — Progress Notes (Signed)
ANTICOAGULATION CONSULT NOTE - Follow Up Consult  Pharmacy Consult for Heparin Indication: chest pain/ACS  No Known Allergies  Patient Measurements: Height: 6\' 1"  (185.4 cm) Weight: 179 lb 0.2 oz (81.2 kg) IBW/kg (Calculated) : 79.9 Heparin Dosing Weight:   Vital Signs: Temp: 96.4 F (35.8 C) (04/18 1600) BP: 85/52 mmHg (04/18 1600) Pulse Rate: 88 (04/18 1600)  Labs:  Recent Labs  04/04/16 0208 04/04/16 0517  04/04/16 1421 04/04/16 2116 04/05/16 0540 04/05/16 1200 04/05/16 1512  HGB 9.0* 8.7*  --   --   --  8.9*  --   --   HCT 27.5* 25.9*  --   --   --  27.4*  --   --   PLT 321 288  --   --   --  299  --   --   HEPARINUNFRC  --   --   < >  --  0.21* 0.29*  --  0.35  CREATININE 3.23* 3.11*  --   --   --  3.31*  --   --   TROPONINI  --   --   < > 3.66*  --   --  2.32* 2.19*  < > = values in this interval not displayed.  Estimated Creatinine Clearance: 18.4 mL/min (by C-G formula based on Cr of 3.31).   Medications:  Infusions:  . feeding supplement (VITAL AF 1.2 CAL)    . fentaNYL infusion INTRAVENOUS 250 mcg/hr (04/05/16 1600)  . furosemide (LASIX) infusion 4 mg/hr (04/05/16 1600)  . heparin 1,500 Units/hr (04/05/16 1600)    Assessment: 04/05/2016   Previous heparin level low at 0.29  No bleeding issues per RN  Hgb low stable, plt stable    Goal of Therapy:  Heparin level 0.3-0.7 units/ml Monitor platelets by anticoagulation protocol: Yes   Plan:   Continue  heparin to 1500 units/hr  Recheck level with AM labs   Royetta Asal, PharmD, BCPS Pager [phone] 04/05/2016 4:56 PM

## 2016-04-05 NOTE — Progress Notes (Signed)
ANTICOAGULATION CONSULT NOTE - Follow Up Consult  Pharmacy Consult for Heparin Indication: chest pain/ACS  No Known Allergies  Patient Measurements: Height: 6\' 1"  (185.4 cm) Weight: 179 lb 0.2 oz (81.2 kg) IBW/kg (Calculated) : 79.9 Heparin Dosing Weight:   Vital Signs: Temp: 97.9 F (36.6 C) (04/18 0600) Temp Source: Core (Comment) (04/18 0400) BP: 93/53 mmHg (04/18 0600) Pulse Rate: 89 (04/18 0600)  Labs:  Recent Labs  04/13/2016 2151 04/04/16 0208 04/04/16 0517 04/04/16 1125 04/04/16 1421 04/04/16 2116 04/05/16 0540  HGB 9.5* 9.0* 8.7*  --   --   --  8.9*  HCT 29.2* 27.5* 25.9*  --   --   --  27.4*  PLT 398 321 288  --   --   --  299  HEPARINUNFRC  --   --   --  0.11*  --  0.21* 0.29*  CREATININE 3.12* 3.23* 3.11*  --   --   --   --   TROPONINI 4.03*  --   --  3.12* 3.66*  --   --     Estimated Creatinine Clearance: 19.6 mL/min (by C-G formula based on Cr of 3.11).   Medications:  Infusions:  . feeding supplement (VITAL AF 1.2 CAL)    . fentaNYL infusion INTRAVENOUS 275 mcg/hr (04/05/16 0230)  . heparin 1,400 Units/hr (04/04/16 2211)    Assessment: Patient with low heparin level.  Level did increase but still below goal.  No heparin issues per RN.  Goal of Therapy:  Heparin level 0.3-0.7 units/ml Monitor platelets by anticoagulation protocol: Yes   Plan:  Increase heparin to 1500 units/hr Recheck level at [name] [name] 04/05/2016,6:20 AM

## 2016-04-05 NOTE — Clinical Social Work Note (Signed)
Clinical Social Work Assessment  Patient Details  Name: [name] MRN: [id number] Date of Birth: 21-Feb-1929  Date of referral:  04/05/16               Reason for consult:  Discharge Planning                Permission sought to share information with:  Facility Art therapist granted to share information::  Yes, Verbal Permission Granted  Name::        Agency::     Relationship::     Contact Information:     Housing/Transportation Living arrangements for the past 2 months:  [place] of Information:  Facility Patient Interpreter Needed:  None Criminal Activity/Legal Involvement Pertinent to Current Situation/Hospitalization:    Significant Relationships:  Spouse, Adult Children Lives with:  Facility Resident Do you feel safe going back to the place where you live?  Yes Need for family participation in patient care:  No (Coment)  Care giving concerns:     Facilities manager / plan:  Pt admitted from [facility]. SW confirmed with Lexine Baton at [facility] that pt is a LTC/SNF resident and is able to return at d/c. Will complete FL2 and follow.   Employment status:    Forensic scientist:    PT Recommendations:    Information / Referral to community resources:  [place]  Patient/Family's Response to care:   Patient/Family's Understanding of and Emotional Response to Diagnosis, Current Treatment, and Prognosis:   Emotional Assessment Appearance:    Attitude/Demeanor/Rapport:    Affect (typically observed):    Orientation:    Alcohol / Substance use:    Psych involvement (Current and /or in the community):     Discharge Needs  Concerns to be addressed:    Readmission within the last 30 days:    Current discharge risk:  None Barriers to Discharge:  No Barriers Identified   Amador Cunas, [place] 04/05/2016, 10:32 AM

## 2016-04-05 NOTE — Progress Notes (Signed)
CRITICAL VALUE ALERT  Critical value received:  Ca 5.5  Date of notification:  04/05/2016  Time of notification:  0620  Critical value read back:Yes.    Nurse who received alert:  Odis Hollingshead RN   MD notified (1st page):  Dr. Vaughan Browner   Time of first page:  0630  MD notified (2nd page):  Time of second page:  Responding MD:  Dr. Vaughan Browner  Time MD responded:  [phone]

## 2016-04-05 NOTE — Progress Notes (Signed)
PULMONARY / CRITICAL CARE MEDICINE   Name: [name] MRN: [id number] DOB: 07-May-1929    ADMISSION DATE:  04/07/2016 CONSULTATION DATE:  04/17/2016  REFERRING MD:  EDP  CHIEF COMPLAINT:  Shortness of breath  SUBJECTIVE:  RN reports pt sedation turned in half - 150 mcg fentanyl, failed SBT with low lung volumes.  RN notes troponin remains elevated (4 > 3.1 > 3.66).  Tolerating reduction of sedation / no agitation.  Calcium replaced.  Low UOP - 315 ml for last 24 hours, rising BUN/Cr.  VITAL SIGNS: BP 92/61 mmHg  Pulse 92  Temp(Src) 97.9 F (36.6 C) (Core (Comment))  Resp 18  Ht 6\' 1"  (1.854 m)  Wt 179 lb 0.2 oz (81.2 kg)  BMI 23.62 kg/m2  SpO2 99%  HEMODYNAMICS: CVP:  [5 mmHg-10 mmHg] 8 mmHg  VENTILATOR SETTINGS: Vent Mode:  [-] PRVC FiO2 (%):  [40 %] 40 % Set Rate:  [14 bmp] 14 bmp Vt Set:  [620 mL] 620 mL PEEP:  [5 cmH20] 5 cmH20 Pressure Support:  [10 cmH20] 10 cmH20 Plateau Pressure:  [18 cmH20-25 cmH20] 21 cmH20  INTAKE / OUTPUT: I/O last 3 completed shifts: In: 2022.3 [I.V.:779; Other:140; IV Piggyback:1103.3] Out: 515 [Urine:515]  PHYSICAL EXAMINATION: General: frail elderly male in NAD on vent Neuro:  Eyes open, nods appropriately to questions, moves upper ext's HEENT: MM pink/moist, pupils 46mm, ETT, unable to appreciate JVD  CV: s1s2 regular, no audible S2, 3-4/6 AS murmur PULM: even/non-labored, lungs bilaterally clear, diminished with crackles lower GI: soft, bsx4 active Extremities: warm/dry, BLE chronic venous stasis, bilateral heel ulcers dressed c/d/i   LABS:  BMET  Recent Labs Lab 03/28/2016 2151 04/04/16 0208 04/04/16 0517 04/05/16 0540  NA 131*  --  134* 133*  K 4.8  --  4.0 4.9  CL 91*  --  98* 95*  CO2 20*  --  21* 22  BUN 70*  --  73* 79*  CREATININE 3.12* 3.23* 3.11* 3.31*  GLUCOSE 155*  --  145* 121*    Electrolytes  Recent Labs Lab 04/09/2016 2151 04/04/16 0517 04/05/16 0540  CALCIUM 6.0* 5.4* 5.5*  MG  --  0.7*  --    PHOS  --  7.5*  --     CBC  Recent Labs Lab 04/04/16 0208 04/04/16 0517 04/05/16 0540  WBC 29.9* 28.7* 26.9*  HGB 9.0* 8.7* 8.9*  HCT 27.5* 25.9* 27.4*  PLT 321 288 299    Coag's No results for input(s): APTT, INR in the last 168 hours.  Sepsis Markers  Recent Labs Lab 04/07/2016 2237 04/04/16 0237  LATICACIDVEN 4.16* 1.63    ABG  Recent Labs Lab 04/13/2016 2043 04/04/16 0023  PHART 7.367 7.320*  PCO2ART 33.2* 39.0  PO2ART 50.7* 186*    Liver Enzymes  Recent Labs Lab 04/04/16 0517 04/05/16 0540  AST 346* 313*  ALT 142* 192*  ALKPHOS 66 74  BILITOT 0.8 0.8  ALBUMIN 2.3* 2.2*    Cardiac Enzymes  Recent Labs Lab 03/30/2016 2151 04/04/16 1125 04/04/16 1421  TROPONINI 4.03* 3.12* 3.66*    Glucose  Recent Labs Lab 04/04/16 1211 04/04/16 1712 04/04/16 2022 04/04/16 2350 04/05/16 0409 04/05/16 0717  GLUCAP 189* 140* 160* 120* 132* 97    Imaging Dg Chest Port 1 View  04/05/2016  CLINICAL DATA:  Respiratory failure, intubated patient EXAM: PORTABLE CHEST 1 VIEW COMPARISON:  Portable chest x-ray of April 04, 2016. FINDINGS: There has been some improvement in the pulmonary interstitium with decreased interstitial  edema. There remain bilateral pleural effusions. The cardiac silhouette remains enlarged. Bibasilar atelectasis or pneumonia persists. The endotracheal tube tip lies approximately 4.1 cm above the carina. The esophagogastric tube appears to been withdrawn since it its tip now lies over the distal third of the SVC. The right subclavian venous catheter tip projects over the distal third of the SVC. IMPRESSION: 1. Decreased pulmonary interstitial edema. Persistent bibasilar atelectasis or pneumonia. Persistent cardiomegaly with moderate-sized bilateral pleural effusions. 2. The esophagogastric tube has been partially withdrawn since it its tip overlies the distal third of the esophagus. Advancement by 20 cm is recommended. Electronically Signed   By:  David  Martinique M.D.   On: 04/05/2016 07:02   Dg Abd Portable 1v  04/04/2016  CLINICAL DATA:  Orogastric tube placement. EXAM: PORTABLE ABDOMEN - 1 VIEW COMPARISON:  None. FINDINGS: The bowel gas pattern is normal. Distal tip of feeding tube is seen in expected position of distal esophagus. IMPRESSION: Distal tip of feeding tube is seen in expected position of distal esophagus. Electronically Signed   By: Marijo Conception, M.D.   On: 04/04/2016 10:13     STUDIES:  CXR 4/16 >> bilateral pulmonary edema, b/l pleural effusions, basilar atelectasis vs pneumonia CXR 4/18 >> low lung volumes, bilateral effusions, cardiomegaly  ECHO 4/17  >> LV inferior septal / apical akinesis, diffuse hypokinesis, mod dilation, mild LVH, EF 25%, severe AS, RA wnl, mild PR, mild TR, trivial pericardial effusion  CULTURES: Blood 4/16 >> UC 4/16 >>   ANTIBIOTICS: Vanc 4/16 >> Cefepime 4/16 >>  SIGNIFICANT EVENTS: 4/17  Admit, intubation for respiratory distress  LINES/TUBES: RIJ 4/16 >> ETT 4/16 >> Foley 4/16 >>  DISCUSSION: 42M with acute on chronic diastolic CHF (last EF [id number] in Feb 2017), moderate-severe AS, and possible sepsis secondary to pneumonia vs skin / soft tissue infection (bilateral heels). UA benign, no other pertinent history to suggest other source. He has an AKI and troponin leak. His BNP is markedly elevated. He also has a history of HTN, HLD, diabetes, hypothyroidism, COPD on Advair/DuoNebs and peripheral arterial disease.  ASSESSMENT / PLAN:  PULMONARY A: Acute hypoxemic respiratory failure - likely secondary to dCHF exacerbation and volume overload, although sepsis 2/2 pneumonia cannot be excluded Bilateral Pleural Effusions  Hx COPD P:   PRVC 8cc/kg Wean PEEP / FiO2 for sats 88-95% SBT / WUA as tolerated  Empiric antibiotics with vanc/cefepime Q6 DuoNebs + Q3 PRN albuterol while intubated - once extubated resume home regimen   CARDIOVASCULAR A:  Troponin leak /  NSTEMI Acute on chronic dCHF exacerbation Hx mod-severe AS Known RBBB Hx Rheumatic / Scarlet Fever P:  Trend troponin Agree with empiric heparin for ACS/NSTEMI for now Follow EKG ASA/Statin Hold antihypertensives Heparin gtt    RENAL A:   Acute on chronic kidney injury (Cr 3 from baseline ~1.6) Lactic acidosis Hyponatremia - likely hypervolemic hyponatremia Hypocalcemia - corrects to 6.8, suspect secondary to renal disease and critical illness  Hypomagnesemia  P:   Lasix gtt, begin at 4mg /hr Trend BMP / UOP  Trend lactate Calcium 1 gm x1 Repeat Mag level 4/18 Consider assessment of ionized Ca+ / PTH   GASTROINTESTINAL A:   Shock Liver vs Congestion with CHF - suspect shock liver Hx Dysphagia, Reflux, Esophageal Strictures P:   GI ppx Unable to place NGT, will ask IR to place under fluoro    HEMATOLOGIC A:   Leukocytosis w/ left shift Chronic anemia P:  Trend CBC Heparin gtt as above  INFECTIOUS A:   Sepsis 2/2 pneumonia vs Skin Soft Tissue Infection vs other Bilateral Heel Decubitus Ulcers - present on admission P:   Empiric antibiotics with vanc, cefepime Follow cultures Wound care for feet   ENDOCRINE A:   Hx diabetes Hx hypothyroidism P:   SSI Continue levothyroxine   NEUROLOGIC A:   Parkinsonian features and probable agitation at baseline P:   RASS goal: 0 to -1 Continue home Sinemet, Seroquel  Fentanyl gtt for pain / sedation    FAMILY  - Updates:   4/18 - phone discussion with wife ([name]) regarding current state of health.  Concerned with pattern of progression since admission.  CHF with worsening renal failure despite aggressive therapy.  Wife understands he may not survive this illness.  He previously had stated he "didn't want to be attached to machines".  Given his prior wishes and current illness, she agrees that CPR, defib are not in his best interest.  She would like to give him ~ 48 hours to see if he makes any improvement and  re-evaluate progression.  No CPR/Defib in the event of arrest.    - Inter-disciplinary family meet or Palliative Care meeting due by: 4/24   Noe Gens, NP-C [facility] Pgr: [phone] or if no answer [phone] 04/05/2016, 8:14 AM

## 2016-04-05 NOTE — Progress Notes (Addendum)
Patient Name: [name] Date of Encounter: 04/05/2016  Principal Problem:   Acute hypoxemic respiratory failure (HCC) Active Problems:   Acute on chronic diastolic CHF (congestive heart failure), NYHA class 4 (HCC)   Type 2 diabetes mellitus with stage 3 chronic kidney disease (HCC)   Aortic stenosis   Hypomagnesemia   Stage III chronic kidney disease   Ectopic atrial tachycardia [facility])   Primary Cardiologist: Dr Angelena Form Patient Profile: 82 y.o. year old male with a history of mod-sev AS, CKD III, COPD, PAD, MAT 2015. Admitted 04/16 w/ sepsis (dx asp PNA 04/14), BNP elevated, s/p ETT, cards asked to see.  SUBJECTIVE: Still on the vent, sedation is off, pt responds when asked to take a deep breath  OBJECTIVE: CVP at 3 am was 8, was 5 yesterday Filed Vitals:   04/05/16 0450 04/05/16 0500 04/05/16 0600 04/05/16 0700  BP:  '93/45 93/53 92/61 '  Pulse:  88 89 92  Temp:  97.9 F (36.6 C) 97.9 F (36.6 C) 97.9 F (36.6 C)  TempSrc:      Resp:  '14 14 18  ' Height:      Weight: 179 lb 0.2 oz (81.2 kg)     SpO2:  100% 100% 99%    Intake/Output Summary (Last 24 hours) at 04/05/16 0745 Last data filed at 04/05/16 0600  Gross per 24 hour  Intake 1058.88 ml  Output    315 ml  Net 743.88 ml   Filed Weights   04/04/16 0600 04/05/16 0450  Weight: 182 lb 1.6 oz (82.6 kg) 179 lb 0.2 oz (81.2 kg)    PHYSICAL EXAM General: Well developed, well nourished, male in no acute distress. Head: Normocephalic, atraumatic.  Neck: Supple without bruits, JVD 11 cm Lungs:  Resp regular and unlabored, rales bases. Heart: RRR, S1, S2, no S3, S4, 2/6 murmur; no rub. Abdomen: Soft, non-tender, non-distended, BS + x 4.  Extremities: No clubbing, cyanosis, 2+ edema.   LABS: CBC: Recent Labs  03/27/2016 2151  04/04/16 0517 04/05/16 0540  WBC 33.2*  < > 28.7* 26.9*  NEUTROABS 30.9*  --   --   --   HGB 9.5*  < > 8.7* 8.9*  HCT 29.2*  < > 25.9* 27.4*  MCV 77.7*  < > 76.9* 78.1  PLT  398  < > 288 299  < > = values in this interval not displayed.  Basic Metabolic Panel: Recent Labs  04/04/16 0517 04/05/16 0540  NA 134* 133*  K 4.0 4.9  CL 98* 95*  CO2 21* 22  GLUCOSE 145* 121*  BUN 73* 79*  CREATININE 3.11* 3.31*  CALCIUM 5.4* 5.5*  MG 0.7*  --   PHOS 7.5*  --    Liver Function Tests: Recent Labs  04/04/16 0517 04/05/16 0540  AST 346* 313*  ALT 142* 192*  ALKPHOS 66 74  BILITOT 0.8 0.8  PROT 6.0* 5.9*  ALBUMIN 2.3* 2.2*   Cardiac Enzymes: Recent Labs  04/05/2016 2151 04/04/16 1125 04/04/16 1421  TROPONINI 4.03* 3.12* 3.66*   BNP:  B NATRIURETIC PEPTIDE  Date/Time Value Ref Range Status  04/04/2016 11:25 AM 2322.4* 0.0 - 100.0 pg/mL Final  03/24/2016 09:51 PM 2579.7* 0.0 - 100.0 pg/mL Final   Fasting Lipid Panel: Recent Labs  04/04/16 0208  TRIG 197*   TELE:   SR, ST, occ  PVCs    ECHO: 04/17  - Left ventricle: Inferior septal and apical akinesis and diffuse  hypokinesis The cavity size was  moderately dilated. Wall  thickness was increased in a pattern of mild LVH. The estimated  ejection fraction was 25%. Doppler parameters are consistent with  elevated ventricular end-diastolic filling pressure. - Aortic valve: Aortic valve severely calcified with restricted  motion. Given degree of LV dysfunction suspect AS is severe  Suggest possible dobutamine echo to further investigate so long as  coronary anatomy known Valve area (VTI): 0.85 cm^2. Valve area  (Vmax): 0.72 cm^2. Valve area (Vmean): 0.63 cm^2.   Mean gradient (S): 20 mm Hg. Peak gradient (S): 38 mm Hg. - Mitral valve: There was mild regurgitation. - Left atrium: The atrium was mildly dilated. - Atrial septum: No defect or patent foramen ovale was identified. - Pericardium, extracardiac: A trivial pericardial effusion was  identified.  Radiology/Studies: Dg Chest Port 1 View 04/05/2016  CLINICAL DATA:  Respiratory failure, intubated patient EXAM: PORTABLE CHEST 1  VIEW COMPARISON:  Portable chest x-ray of April 04, 2016. FINDINGS: There has been some improvement in the pulmonary interstitium with decreased interstitial edema. There remain bilateral pleural effusions. The cardiac silhouette remains enlarged. Bibasilar atelectasis or pneumonia persists. The endotracheal tube tip lies approximately 4.1 cm above the carina. The esophagogastric tube appears to been withdrawn since it its tip now lies over the distal third of the SVC. The right subclavian venous catheter tip projects over the distal third of the SVC. IMPRESSION: 1. Decreased pulmonary interstitial edema. Persistent bibasilar atelectasis or pneumonia. Persistent cardiomegaly with moderate-sized bilateral pleural effusions. 2. The esophagogastric tube has been partially withdrawn since it its tip overlies the distal third of the esophagus. Advancement by 20 cm is recommended. Electronically Signed   By: David  Martinique M.D.   On: 04/05/2016 07:02   Dg Chest Port 1 View 04/04/2016  CLINICAL DATA:  Assess status of central line position ; history of aortic stenosis, ventricular tachycardia, chronic CHF, diabetes, COPD. Intubated patient. EXAM: PORTABLE CHEST 1 VIEW COMPARISON:  Portable chest x-ray of April 04, 2016 at 1:12 a.m. FINDINGS: The right internal jugular venous catheter tip projects over the junction of the proximal and middle portions of the SVC. This is stable. The endotracheal tube tip projects approximately 5 cm above the carina. The esophagogastric tube tip projects below the inferior margin of the image. The lungs are adequately inflated. The interstitial markings remain increased diffusely. Moderate-sized bilateral pleural effusions persist. The cardiac silhouette remains enlarged. The pulmonary vascularity is slightly less prominent today. IMPRESSION: Stable positioning of the support tubes. Persistent interstitial edema or infiltrate with bilateral pleural effusions. Stable cardiomegaly with slight  improvement in pulmonary vascular congestion. Electronically Signed   By: David  Martinique M.D.   On: 04/04/2016 07:25   Dg Abd Portable 1v 04/04/2016  CLINICAL DATA:  Orogastric tube placement. EXAM: PORTABLE ABDOMEN - 1 VIEW COMPARISON:  None. FINDINGS: The bowel gas pattern is normal. Distal tip of feeding tube is seen in expected position of distal esophagus. IMPRESSION: Distal tip of feeding tube is seen in expected position of distal esophagus. Electronically Signed   By: Marijo Conception, M.D.   On: 04/04/2016 10:13    Current Medications:  . ammonium lactate   Topical Daily  . antiseptic oral rinse  7 mL Mouth Rinse QID  . aspirin  324 mg Per NG tube Once  . calcium gluconate  2 g Intravenous Once  . ceFEPime (MAXIPIME) IV  2 g Intravenous Q24H  . chlorhexidine gluconate (SAGE KIT)  15 mL Mouth Rinse BID  . collagenase  Topical Daily  . feeding supplement (PRO-STAT SUGAR FREE 64)  30 mL Per Tube BID  . ipratropium-albuterol  3 mL Nebulization Q6H  . levothyroxine  112 mcg Per Tube QAC breakfast  . QUEtiapine  25 mg Per Tube QHS  . ranitidine  150 mg Per Tube QHS  . simvastatin  40 mg Oral q1800   . feeding supplement (VITAL AF 1.2 CAL)    . fentaNYL infusion INTRAVENOUS 150 mcg/hr (04/05/16 0720)  . heparin 1,500 Units/hr (04/05/16 1324)    ASSESSMENT AND PLAN:  The patient is elderly with multiple comorbidities including COPD, moderate to severe aortic stenosis, history of atrial arrhythmia (multifocal atrial tachycardia), and chronic kidney disease. Admitted on this occasion with progressive dyspnea and noted to have significant elevation in troponin and irregular heart rhythm. UOP poor overnight  Principal Problem:   Acute hypoxemic respiratory failure (HCC) due to Acute Systolic HF - mgt per CCM - CXR improved  Active Problems:   Acute systolic CHF (congestive heart failure), NYHA class 4 ([place]). New decreased LV function and AS. - Lasix 60 mg IV BID ordered yesterday but  not given - UOP < 400 cc for the day - needs diuresis but BUN/Cr are worsening - CCM to discuss goals of care w/ family    Elevated troponin - It does appear that the patient has had a recent inferior MI. I believe troponin's are trending down. This is perhaps > 59 days old. - His EF is lower now, but cannot use ACE/ARB due to poor renal function - BP too low for BB - Given comorbidities and overall clinical condition, invasive evaluation with cardiac catheterization will not be pursued.    Type 2 diabetes mellitus with stage 3 chronic kidney disease ([place]) - per CCM    Aortic stenosis - see echo report above - with lower EF, may have low-output severe AS - not a candidate for TAVR  Otherwise, per CCM   Hypomagnesemia   Stage III chronic kidney disease   Ectopic atrial tachycardia ([place])   Signed, Barrett, Rhonda , PA-C 7:45 AM 04/05/2016 The patient has been seen in conjunction with Rosaria Ferries, PA-C. All aspects of care have been considered and discussed. The patient has been personally interviewed, examined, and all clinical data has been reviewed.   With accumulating data, it is clear that the patient's presentation is related to acute systolic heart failure, likely related to myocardial infarction occurring sometime between late February and admission. Cardiac markers are trending downward. LVEF is now 25% down from 60% in February. There is a clear inferior wall motion abnormality. He also has superimposed significant aortic stenosis.  Plan is as outlined above and should include diuresis to clear heart failure and initiation of heart failure therapy as tolerated. We will not be able to use ARB or ACE inhibitor therapy due to kidney injury/CKD.  Overall prognosis is poor given comorbidities.

## 2016-04-05 NOTE — Progress Notes (Signed)
Took patient to radiology to have NG placed per radiologist.  Took patient on the ventilator and on the monitor.  Radiologist was unable to pass the NG to the stomach.  Told Noe Gens NP that radiology was unable to pass NG.  Patient continue to be awake and following commands.  Continue to monitor patient closely.  Pancho Rushing Roselie Awkward RN

## 2016-04-06 LAB — CBC
HCT: 27.1 % — ABNORMAL LOW (ref 39.0–52.0)
Hemoglobin: 8.8 g/dL — ABNORMAL LOW (ref 13.0–17.0)
MCH: 25.4 pg — AB (ref 26.0–34.0)
MCHC: 32.5 g/dL (ref 30.0–36.0)
MCV: 78.3 fL (ref 78.0–100.0)
PLATELETS: 289 10*3/uL (ref 150–400)
RBC: 3.46 MIL/uL — ABNORMAL LOW (ref 4.22–5.81)
RDW: 17.7 % — AB (ref 11.5–15.5)
WBC: 29.8 10*3/uL — AB (ref 4.0–10.5)

## 2016-04-06 LAB — BASIC METABOLIC PANEL
Anion gap: 22 — ABNORMAL HIGH (ref 5–15)
BUN: 83 mg/dL — ABNORMAL HIGH (ref 6–20)
CALCIUM: 6.1 mg/dL — AB (ref 8.9–10.3)
CO2: 18 mmol/L — AB (ref 22–32)
CREATININE: 3.4 mg/dL — AB (ref 0.61–1.24)
Chloride: 96 mmol/L — ABNORMAL LOW (ref 101–111)
GFR calc Af Amer: 18 mL/min — ABNORMAL LOW (ref >60)
GFR calc non Af Amer: 15 mL/min — ABNORMAL LOW (ref >60)
GLUCOSE: 103 mg/dL — AB (ref 65–99)
Potassium: 5 mmol/L (ref 3.5–5.1)
Sodium: 136 mmol/L (ref 135–145)

## 2016-04-06 LAB — GLUCOSE, CAPILLARY
GLUCOSE-CAPILLARY: 95 mg/dL (ref 65–99)
GLUCOSE-CAPILLARY: 111 mg/dL — AB (ref 65–99)
GLUCOSE-CAPILLARY: 63 mg/dL — AB (ref 65–99)
Glucose-Capillary: 74 mg/dL (ref 65–99)

## 2016-04-06 LAB — PHOSPHORUS: Phosphorus: 8.7 mg/dL — ABNORMAL HIGH (ref 2.5–4.6)

## 2016-04-06 LAB — ALBUMIN: ALBUMIN: 2.2 g/dL — AB (ref 3.5–5.0)

## 2016-04-06 LAB — HEPARIN LEVEL (UNFRACTIONATED): Heparin Unfractionated: 0.23 IU/mL — ABNORMAL LOW (ref 0.30–0.70)

## 2016-04-06 LAB — PROCALCITONIN: Procalcitonin: 1.67 ng/mL

## 2016-04-06 LAB — TROPONIN I: TROPONIN I: 1.82 ng/mL — AB (ref <0.031)

## 2016-04-06 LAB — MAGNESIUM: Magnesium: 1.7 mg/dL (ref 1.7–2.4)

## 2016-04-06 MED ORDER — CARBIDOPA-LEVODOPA 10-100 MG PO TABS
1.0000 | ORAL_TABLET | Freq: Every day | ORAL | Status: DC
  Filled 2016-04-06: qty 1

## 2016-04-06 MED ORDER — HEPARIN (PORCINE) IN NACL 100-0.45 UNIT/ML-% IJ SOLN
1650.0000 [IU]/h | INTRAMUSCULAR | Status: DC
  Filled 2016-04-06: qty 250

## 2016-04-06 MED ORDER — QUETIAPINE FUMARATE 50 MG PO TABS: 25.0000 mg | ORAL_TABLET | Freq: Every day | ORAL | Status: DC

## 2016-04-06 MED ORDER — ASPIRIN EC 81 MG PO TBEC: 81.0000 mg | DELAYED_RELEASE_TABLET | Freq: Every day | ORAL | Status: DC

## 2016-04-06 MED ORDER — FENTANYL CITRATE (PF) 100 MCG/2ML IJ SOLN: 50.0000 ug | INTRAMUSCULAR | Status: DC | PRN

## 2016-04-06 MED ORDER — LORAZEPAM 0.5 MG PO TABS: 0.5000 mg | ORAL_TABLET | Freq: Four times a day (QID) | ORAL | Status: DC | PRN

## 2016-04-06 MED ORDER — DEXTROSE 50 % IV SOLN
INTRAVENOUS | Status: AC
  Administered 2016-04-06: 25 mL
  Filled 2016-04-06: qty 50

## 2016-04-06 MED ORDER — LORAZEPAM 0.5 MG PO TABS: 0.5000 mg | ORAL_TABLET | Freq: Three times a day (TID) | ORAL | Status: DC

## 2016-04-06 MED ORDER — SERTRALINE HCL 100 MG PO TABS: 100.0000 mg | ORAL_TABLET | Freq: Every day | ORAL | Status: DC

## 2016-04-07 ENCOUNTER — Telehealth: Payer: Self-pay

## 2016-04-07 NOTE — Telephone Encounter (Signed)
On 04/07/2016 I received a death certificate from [facility] (original). The death certificate is for cremation. The patient is a patient of Doctor Sood. The death certificate will be taken to Pulmonary Unit @ [facility] tomorrow am for signature. On 06-May-2016 I received the death certificate back from [facility] [place]. I got the death certificate ready and called the funeral home to let them know the death certificate is ready for pickup.

## 2016-04-09 LAB — CULTURE, BLOOD (ROUTINE X 2)
Culture: NO GROWTH
Culture: NO GROWTH

## 2016-04-18 NOTE — Procedures (Signed)
Extubation Procedure Note  Patient Details:   Name: [name] DOB: 06/25/1929 MRN: [id number]   Airway Documentation:     Evaluation  O2 sats: stable throughout Complications: No apparent complications Patient did not tolerate procedure well. Bilateral Breath Sounds: Clear, Diminished  Unable to get pt to speak, RN aware No  Tamala Julian 04/27/2016, 9:35 AM

## 2016-04-18 NOTE — Progress Notes (Signed)
CRITICAL VALUE ALERT  Critical value received:  Ca 6.1  Date of notification:  04/16/2016  Time of notification:  [id number]  Critical value read back:Yes.    Nurse who received alert:  Odis Hollingshead RN  MD notified (1st page):  Warren Lacy MD   Time of first page:  0600  MD notified (2nd page):  Time of second page:  Responding MD:  Dr. Mortimer Fries  Time MD responded:  [phone]

## 2016-04-18 NOTE — Progress Notes (Signed)
ANTICOAGULATION CONSULT NOTE - Follow Up Consult  Pharmacy Consult for Heparin Indication: chest pain/ACS  No Known Allergies  Patient Measurements: Height: 6\' 1"  (185.4 cm) Weight: 182 lb 1.6 oz (82.6 kg) IBW/kg (Calculated) : 79.9 Heparin Dosing Weight:   Vital Signs: Temp: 97.7 F (36.5 C) (04/19 0600) Temp Source: Core (Comment) (04/19 0400) BP: 96/51 mmHg (04/19 0600) Pulse Rate: 97 (04/19 0600)  Labs:  Recent Labs  04/04/16 0517  04/05/16 0540 04/05/16 1200 04/05/16 1512 04/05/16 2328 Apr 28, 2016 0450  HGB 8.7*  --  8.9*  --   --   --  8.8*  HCT 25.9*  --  27.4*  --   --   --  27.1*  PLT 288  --  299  --   --   --  289  HEPARINUNFRC  --   < > 0.29*  --  0.35  --  0.23*  CREATININE 3.11*  --  3.31*  --   --   --  3.40*  TROPONINI  --   < >  --  2.32* 2.19* 1.82*  --   < > = values in this interval not displayed.  Estimated Creatinine Clearance: 18 mL/min (by C-G formula based on Cr of 3.4).   Medications:  Infusions:  . feeding supplement (VITAL AF 1.2 CAL)    . fentaNYL infusion INTRAVENOUS 225 mcg/hr (2016/04/28 0600)  . furosemide (LASIX) infusion 4 mg/hr (04-28-16 0600)  . heparin 1,500 Units/hr (04/28/2016 0600)    Assessment: Patient's an 82 y.o M presented to the ED on 4/17 with SOB. He was found to have positive cardiac enzymes. Heparin drip started for r/o ACS  Today, Apr 28, 2016  Heparin level subtherapeutic at current rate of 1500 units/hr despite being therapeutic yesterday.   No reported bleeding  No problems with IV line per RN  CBC stable   Goal of Therapy:  Heparin level 0.3-0.7 units/ml Monitor platelets by anticoagulation protocol: Yes   Plan:  1) Increase IV heparin rate to 1650 units/hr 2) Recheck heparin level 8 hours after rate change 3) Daily heparin level and CBC   Adrian Saran, PharmD, BCPS Pager [phone] 04/28/2016 7:54 AM

## 2016-04-18 NOTE — Progress Notes (Signed)
Pt extubated at 0910, and placed on 4 lpm [place].

## 2016-04-18 NOTE — Progress Notes (Signed)
Patient Name: [name] Date of Encounter: 04-15-2016  Principal Problem:   Acute hypoxemic respiratory failure (HCC) Active Problems:   Type 2 diabetes mellitus with stage 3 chronic kidney disease (HCC)   Aortic stenosis   Hypomagnesemia   Stage III chronic kidney disease   Pressure ulcer   Acute on chronic diastolic CHF (congestive heart failure), NYHA class 4 (HCC)   Ectopic atrial tachycardia [facility])   Primary Cardiologist: Dr. Angelena Form Patient Profile: 82 y.o. year old male with a history of mod-sev AS, CKD III, COPD, PAD, MAT 2015. Admitted 04/16 w/ sepsis (dx asp PNA 04/14), BNP elevated, s/p ETT, cards asked to see.  SUBJECTIVE: Remains on ventilator, currently on wean trial. Responds appropriately to commands.   OBJECTIVE Filed Vitals:   2016/04/15 0400 04/15/2016 0500 April 15, 2016 0600 04/15/16 0748  BP: 122/54 104/54 96/51   Pulse: 94 93 97   Temp: 97.9 F (36.6 C) 97.9 F (36.6 C) 97.7 F (36.5 C)   TempSrc: Core (Comment)     Resp: '14 22 18   ' Height:      Weight:      SpO2: 100% 98% 100% 100%    Intake/Output Summary (Last 24 hours) at 04-15-2016 0816 Last data filed at 04-15-16 0600  Gross per 24 hour  Intake 1305.5 ml  Output    445 ml  Net  860.5 ml   Filed Weights   04/04/16 0600 04/05/16 0450 04-15-2016 0336  Weight: 182 lb 1.6 oz (82.6 kg) 179 lb 0.2 oz (81.2 kg) 182 lb 1.6 oz (82.6 kg)    PHYSICAL EXAM General: Well developed, well nourished,elderly male in no acute distress. Head: Normocephalic, atraumatic.  Neck: Supple without bruits, No JVD. Lungs:  Resp regular and unlabored, coarse rhonchi in upper lobes.  Heart: RRR, S1, S2, no S3, S4, 2/6 systolic murmur; no rub. Abdomen: Soft, non-tender, non-distended, BS + x 4.  Extremities: No clubbing, cyanosis, +2 BLE edema.   Neuro: Alert and oriented X 3. Moves all extremities spontaneously. Psych: Normal affect.  LABS: CBC: Recent Labs  04/09/2016 2151  04/05/16 0540 2016/04/15 0450    WBC 33.2*  < > 26.9* 29.8*  NEUTROABS 30.9*  --   --   --   HGB 9.5*  < > 8.9* 8.8*  HCT 29.2*  < > 27.4* 27.1*  MCV 77.7*  < > 78.1 78.3  PLT 398  < > 299 289  < > = values in this interval not displayed. Basic Metabolic Panel: Recent Labs  04/04/16 0517 04/05/16 0540 15-Apr-2016 0450  NA 134* 133* 136  K 4.0 4.9 5.0  CL 98* 95* 96*  CO2 21* 22 18*  GLUCOSE 145* 121* 103*  BUN 73* 79* 83*  CREATININE 3.11* 3.31* 3.40*  CALCIUM 5.4* 5.5* 6.1*  MG 0.7* 1.2* 1.7  PHOS 7.5*  --  8.7*   Liver Function Tests: Recent Labs  04/04/16 0517 04/05/16 0540 04-15-2016 0450  AST 346* 313*  --   ALT 142* 192*  --   ALKPHOS 66 74  --   BILITOT 0.8 0.8  --   PROT 6.0* 5.9*  --   ALBUMIN 2.3* 2.2* 2.2*   Cardiac Enzymes: Recent Labs  04/05/16 1200 04/05/16 1512 04/05/16 2328  TROPONINI 2.32* 2.19* 1.82*   BNP:  B NATRIURETIC PEPTIDE  Date/Time Value Ref Range Status  04/04/2016 11:25 AM 2322.4* 0.0 - 100.0 pg/mL Final  04/14/2016 09:51 PM 2579.7* 0.0 - 100.0 pg/mL Final  Fasting Lipid Panel: Recent Labs  04/04/16 0208  TRIG 197*     Current facility-administered medications:  .  0.9 %  sodium chloride infusion, 250 mL, Intravenous, PRN, Dannielle Burn, MD .  albuterol (PROVENTIL) (2.5 MG/3ML) 0.083% nebulizer solution 2.5 mg, 2.5 mg, Nebulization, Q3H PRN, Donita Brooks, NP .  ammonium lactate (LAC-HYDRIN) 12 % lotion, , Topical, Daily, Juanito Doom, MD .  antiseptic oral rinse solution (CORINZ), 7 mL, Mouth Rinse, QID, Juanito Doom, MD, 7 mL at 2016/05/05 0334 .  aspirin chewable tablet 324 mg, 324 mg, Per NG tube, Once, Ankit Nanavati, MD .  bisacodyl (DULCOLAX) suppository 10 mg, 10 mg, Rectal, Daily PRN, Donita Brooks, NP .  ceFEPIme (MAXIPIME) 2 g in dextrose 5 % 50 mL IVPB, 2 g, Intravenous, Q24H, Juanito Doom, MD, 2 g at 04/05/16 2142 .  chlorhexidine gluconate (SAGE KIT) (PERIDEX) 0.12 % solution 15 mL, 15 mL, Mouth Rinse, BID, Juanito Doom, MD, 15 mL at May 05, 2016 0724 .  Chlorhexidine Gluconate Cloth 2 % PADS 6 each, 6 each, Topical, Q0600, Chesley Mires, MD, 6 each at May 05, 2016 0600 .  collagenase (SANTYL) ointment, , Topical, Daily, Juanito Doom, MD .  feeding supplement (PRO-STAT SUGAR FREE 64) liquid 30 mL, 30 mL, Per Tube, BID, Juanito Doom, MD, 30 mL at 04/04/16 1600 .  feeding supplement (VITAL AF 1.2 CAL) liquid 1,000 mL, 1,000 mL, Per Tube, Continuous, Juanito Doom, MD, 1,000 mL at 04/04/16 1600 .  fentaNYL (SUBLIMAZE) 2,500 mcg in sodium chloride 0.9 % 250 mL (10 mcg/mL) infusion, 25-400 mcg/hr, Intravenous, Continuous, Donita Brooks, NP, Last Rate: 22.5 mL/hr at 2016-05-05 0600, 225 mcg/hr at May 05, 2016 0600 .  fentaNYL (SUBLIMAZE) bolus via infusion 25 mcg, 25 mcg, Intravenous, Q1H PRN, Donita Brooks, NP .  fentaNYL (SUBLIMAZE) injection 25 mcg, 25 mcg, Intravenous, Q2H PRN, Praveen Mannam, MD .  furosemide (LASIX) 250 mg in dextrose 5 % 250 mL (1 mg/mL) infusion, 4 mg/hr, Intravenous, Continuous, Brandi L Ollis, NP, Last Rate: 4 mL/hr at 05-May-2016 0600, 4 mg/hr at 05/05/16 0600 .  heparin ADULT infusion 100 units/mL (25000 units/250 mL), 1,650 Units/hr, Intravenous, Continuous, Adrian Saran, RPH .  ipratropium-albuterol (DUONEB) 0.5-2.5 (3) MG/3ML nebulizer solution 3 mL, 3 mL, Nebulization, Q6H, Dannielle Burn, MD, 3 mL at 05/05/2016 0748 .  levothyroxine (SYNTHROID, LEVOTHROID) injection 56 mcg, 56 mcg, Intravenous, Daily, Donita Brooks, NP, 56 mcg at 04/05/16 0946 .  mupirocin ointment (BACTROBAN) 2 % 1 application, 1 application, Nasal, BID, Chesley Mires, MD, 1 application at 86/76/72 2200 .  QUEtiapine (SEROQUEL) tablet 25 mg, 25 mg, Per Tube, QHS, Dannielle Burn, MD, 25 mg at 04/04/16 2200 .  ranitidine (ZANTAC) 150 MG/10ML syrup 150 mg, 150 mg, Per Tube, QHS, Dannielle Burn, MD .  simvastatin [facility]) tablet 40 mg, 40 mg, Oral, q1800, Dannielle Burn, MD, 40 mg at 04/04/16  1800 . feeding supplement (VITAL AF 1.2 CAL)    . fentaNYL infusion INTRAVENOUS 225 mcg/hr (05-05-2016 0600)  . furosemide (LASIX) infusion 4 mg/hr (May 05, 2016 0600)  . heparin      TELE: NSR.          Radiology/Studies: Dg Fluoro Rm 1-60 Min  04/05/2016  CLINICAL DATA:  82 year old male for placement of nasogastric tube which was unsuccessful on floor. Initial encounter. EXAM: FLOURO RM 1-60 MIN CONTRAST:  None. FLUOROSCOPY TIME:  Radiation Exposure Index (as provided by the fluoroscopic device):  3.64 micro Gray Fluoroscopy Time:  19 seconds. COMPARISON:  None. FINDINGS: Attempted at placement nasogastric tube was unsuccessful. Nasogastric tube could not be advanced beyond the cervical esophagus. IMPRESSION: Attempted at placement nasogastric tube was unsuccessful. Nasogastric tube could not be advanced beyond the cervical esophagus. Electronically Signed   By: Genia Del M.D.   On: 04/05/2016 12:52   Dg Chest Port 1 View  04/05/2016  CLINICAL DATA:  Acute respiratory failure EXAM: PORTABLE CHEST 1 VIEW COMPARISON:  04/05/2016 FINDINGS: Right IJ catheter tip is in the cavoatrial junction. The ET tube tip is above the carina. Aortic atherosclerosis is present. There are bilateral pleural effusions and pulmonary edema identified compatible with CHF. IMPRESSION: 1. Similar appearance of CHF pattern. Electronically Signed   By: Kerby Moors M.D.   On: 04/05/2016 10:35   Dg Chest Port 1 View  04/05/2016  CLINICAL DATA:  Respiratory failure, intubated patient EXAM: PORTABLE CHEST 1 VIEW COMPARISON:  Portable chest x-ray of April 04, 2016. FINDINGS: There has been some improvement in the pulmonary interstitium with decreased interstitial edema. There remain bilateral pleural effusions. The cardiac silhouette remains enlarged. Bibasilar atelectasis or pneumonia persists. The endotracheal tube tip lies approximately 4.1 cm above the carina. The esophagogastric tube appears to been withdrawn since it its  tip now lies over the distal third of the SVC. The right subclavian venous catheter tip projects over the distal third of the SVC. IMPRESSION: 1. Decreased pulmonary interstitial edema. Persistent bibasilar atelectasis or pneumonia. Persistent cardiomegaly with moderate-sized bilateral pleural effusions. 2. The esophagogastric tube has been partially withdrawn since it its tip overlies the distal third of the esophagus. Advancement by 20 cm is recommended. Electronically Signed   By: David  Martinique M.D.   On: 04/05/2016 07:02   Dg Abd Portable 1v  04/04/2016  CLINICAL DATA:  Orogastric tube placement. EXAM: PORTABLE ABDOMEN - 1 VIEW COMPARISON:  None. FINDINGS: The bowel gas pattern is normal. Distal tip of feeding tube is seen in expected position of distal esophagus. IMPRESSION: Distal tip of feeding tube is seen in expected position of distal esophagus. Electronically Signed   By: Marijo Conception, M.D.   On: 04/04/2016 10:13     Current Medications:  . ammonium lactate   Topical Daily  . antiseptic oral rinse  7 mL Mouth Rinse QID  . aspirin  324 mg Per NG tube Once  . ceFEPime (MAXIPIME) IV  2 g Intravenous Q24H  . chlorhexidine gluconate (SAGE KIT)  15 mL Mouth Rinse BID  . Chlorhexidine Gluconate Cloth  6 each Topical Q0600  . collagenase   Topical Daily  . feeding supplement (PRO-STAT SUGAR FREE 64)  30 mL Per Tube BID  . ipratropium-albuterol  3 mL Nebulization Q6H  . levothyroxine  56 mcg Intravenous Daily  . mupirocin ointment  1 application Nasal BID  . QUEtiapine  25 mg Per Tube QHS  . ranitidine  150 mg Per Tube QHS  . simvastatin  40 mg Oral q1800   . feeding supplement (VITAL AF 1.2 CAL)    . fentaNYL infusion INTRAVENOUS 225 mcg/hr (04/15/2016 0600)  . furosemide (LASIX) infusion 4 mg/hr (04/15/16 0600)  . heparin      ASSESSMENT AND PLAN: Principal Problem:   Acute hypoxemic respiratory failure (HCC) Active Problems:   Type 2 diabetes mellitus with stage 3 chronic  kidney disease (HCC)   Aortic stenosis   Hypomagnesemia   Stage III chronic kidney disease   Pressure ulcer  Acute on chronic diastolic CHF (congestive heart failure), NYHA class 4 (HCC)   Ectopic atrial tachycardia (HCC)  1. Acute hypoxemic respiratory failure: PCCM managing.  Patient remains on ventilator.   2. Acute systolic CHF:  EF has drastically reduced to 25% with inferior septal and apical akinesis, and diffuse hypokinesis.  This is down from EF of 60% 2 months ago.  He was started on Lasix gtt (78m/hr) yesterday and has put out less than 509m  Creatinine has bumped only slightly from yesterday, but still elevated at 3.4 from acute on chronic kidney disease. Weight is up 3 pounds from yesterday. No ACE or ARB.  BP is soft, no beta blocker.   3. Elevated troponin: Most likely due to recent inferior MI.  No beta blocker in setting of hypotension.   4. Severe AS: Echo above. Not a TAVR candidate.   5. Chronic kidney disease stage III: No ACE or ARB.  PCCM discussing goals of care with family.     Signed, ErArbutus Leas NP 8:16 AM 03/2016-05-01ager [id number] patient has been seen in conjunction with ErJettie BoozeNP. All aspects of care have been considered and discussed. The patient has been personally interviewed, examined, and all clinical data has been reviewed.   Urine output has been meager with diuretic therapy. This is likely related to low cardiac output from aortic stenosis and decreased LV function. Blood pressure is soft. Patient is being prepared for extubation.  Overall prognosis is felt to be poor.

## 2016-04-18 NOTE — Discharge Summary (Signed)
[name] was a 82 y.o. admitted on 03/25/2016 with dyspnea and chest pain.  He was at [facility] prior to admission, and was being treated for aspiration pneumonia.  He required intubation for respiratory distress, and was continued on antibiotics.  He had elevated cardiac enzymes and atrial tachycardia, and cardiology was consulted.  He had Echo that showed acute systolic CHF with EF 123456.  Patient's status was d/w his wife by phone.  She informed medical staff that he would not want heroic measures.  He was made no CPR, no defibrillation.  He had improvement in his respiratory status and did well with SBT on 04/29/2016.  He was subsequently extubated.  About 40 minutes after extubation he developed hypotension.  His lasix infusion was discontinued.  He then developed agonal breathing pattern with bradycardia.  He was in PEA.  This was d/w pt's wife over the phone who again confirmed that he should not undergo cardiac resuscitation.  He expired on 29-Apr-2016 at 945 AM.   Final Diagnoses: Acute hypoxic respiratory failure HCAP with aspiration pneumonia Hx of COPD Acute kidney injury Lactic acidosis Hyponatremia Hypocalcemia Hypomagnesemia Acute systolic CHF Acute on chronic diastolic CHF PEA cardiac arrest Severe aortic stenosis with hx of rheumatic fever NSTEMI CKD stage III Anemia of chronic disease and critical illness Sepsis 2nd to pneumonia Hx of DM with renal complications Hx of hypothyroidism Hx of Parkinson's disease B/l heel pressure ulcers present prior to this admission  Chesley Mires, MD [place] 04/29/2016, 10:26 AM

## 2016-04-18 NOTE — Progress Notes (Signed)
Patient extubated at [facility].  Approximately 40 minutes post extubation, he became hypotensive.  Lasix gtt was discontinued.  He later had a change in breathing pattern and heart rhythm (later PEA).  Wife notified by phone and she agreed for no further interventions / natural death.  Patient expired 0945.  Wife updated via phone.  Questions answered.  Given information to call with update for funeral home.  She indicates understanding.  Noe Gens, NP-C [facility] Pgr: [phone] or if no answer [phone] Apr 27, 2016, 10:01 AM

## 2016-04-18 NOTE — Progress Notes (Signed)
PULMONARY / CRITICAL CARE MEDICINE   Name: [name] MRN: [id number] DOB: 1929/12/09    ADMISSION DATE:  03/29/2016 CONSULTATION DATE:  03/22/2016  REFERRING MD:  EDP  CHIEF COMPLAINT:  Shortness of breath  SUBJECTIVE:  RN reports pt sedation down to 120 mcg fentanyl, with pt currently on SBT & tolerating well.  Unable to decrease sedation anymore at this point, pt trying to pull out his ETT.  Troponin trending down (4 > 3.1 > 3.66>2.32).  Improved UOP on lasix gtt  315 > 475 ml for last 24 hours, but  rising BUN/Cr.  Unable to place OGT under fluro yesterday.  VITAL SIGNS: BP 96/51 mmHg  Pulse 97  Temp(Src) 97.7 F (36.5 C) (Core (Comment))  Resp 18  Ht 6\' 1"  (1.854 m)  Wt 182 lb 1.6 oz (82.6 kg)  BMI 24.03 kg/m2  SpO2 100%  HEMODYNAMICS: CVP:  [10 mmHg-16 mmHg] 11 mmHg  VENTILATOR SETTINGS: Vent Mode:  [-] PRVC FiO2 (%):  [30 %-40 %] 30 % Set Rate:  [14 bmp] 14 bmp Vt Set:  [620 mL] 620 mL PEEP:  [5 cmH20] 5 cmH20 Plateau Pressure:  [14 cmH20-30 cmH20] 21 cmH20  INTAKE / OUTPUT: I/O last 3 completed shifts: In: 2005.2 [I.V.:1355.2; Other:350; IV Piggyback:300] Out: 640 [Urine:640]  PHYSICAL EXAMINATION: General: frail elderly male in NAD on vent Neuro:  Eyes open, nods appropriately to questions, moves upper ext's > lower extremities.  Reaches for ETT HEENT: MM pink/moist, pupils 42mm, ETT, no JVD  CV: s1 irregular, no audible S2, 3-4/6 AS murmur PULM: even/non-labored, lungs bilaterally clear, diminished with scattered crackles lower GI: soft, bsx4 active Extremities: warm/dry, BLE chronic venous stasis, bilateral heel ulcers dressed c/d/i   LABS:  BMET  Recent Labs Lab 04/04/16 0517 04/05/16 0540 Apr 09, 2016 0450  NA 134* 133* 136  K 4.0 4.9 5.0  CL 98* 95* 96*  CO2 21* 22 18*  BUN 73* 79* 83*  CREATININE 3.11* 3.31* 3.40*  GLUCOSE 145* 121* 103*    Electrolytes  Recent Labs Lab 04/04/16 0517 04/05/16 0540 04/09/16 0450  CALCIUM 5.4*  5.5* 6.1*  MG 0.7* 1.2* 1.7  PHOS 7.5*  --  8.7*    CBC  Recent Labs Lab 04/04/16 0517 04/05/16 0540 Apr 09, 2016 0450  WBC 28.7* 26.9* 29.8*  HGB 8.7* 8.9* 8.8*  HCT 25.9* 27.4* 27.1*  PLT 288 299 289    Coag's No results for input(s): APTT, INR in the last 168 hours.  Sepsis Markers  Recent Labs Lab 04/10/2016 2237 04/04/16 0237 04/05/16 1200 04/09/2016 0450  LATICACIDVEN 4.16* 1.63  --   --   PROCALCITON  --   --  1.99 1.67    ABG  Recent Labs Lab 04/10/2016 2043 04/04/16 0023  PHART 7.367 7.320*  PCO2ART 33.2* 39.0  PO2ART 50.7* 186*    Liver Enzymes  Recent Labs Lab 04/04/16 0517 04/05/16 0540 04/09/2016 0450  AST 346* 313*  --   ALT 142* 192*  --   ALKPHOS 66 74  --   BILITOT 0.8 0.8  --   ALBUMIN 2.3* 2.2* 2.2*    Cardiac Enzymes  Recent Labs Lab 04/05/16 1200 04/05/16 1512 04/05/16 2328  TROPONINI 2.32* 2.19* 1.82*    Glucose  Recent Labs Lab 04/05/16 0409 04/05/16 0717 04/05/16 1201 04/05/16 1601 04/05/16 2323 04/09/16 0346  GLUCAP 132* 97 105* 100* 83 74    Imaging Dg Fluoro Rm 1-60 Min  04/05/2016  CLINICAL DATA:  82 year old male for placement of  nasogastric tube which was unsuccessful on floor. Initial encounter. EXAM: FLOURO RM 1-60 MIN CONTRAST:  None. FLUOROSCOPY TIME:  Radiation Exposure Index (as provided by the fluoroscopic device): 3.64 micro Gray Fluoroscopy Time:  19 seconds. COMPARISON:  None. FINDINGS: Attempted at placement nasogastric tube was unsuccessful. Nasogastric tube could not be advanced beyond the cervical esophagus. IMPRESSION: Attempted at placement nasogastric tube was unsuccessful. Nasogastric tube could not be advanced beyond the cervical esophagus. Electronically Signed   By: Genia Del M.D.   On: 04/05/2016 12:52   Dg Chest Port 1 View  04/05/2016  CLINICAL DATA:  Acute respiratory failure EXAM: PORTABLE CHEST 1 VIEW COMPARISON:  04/05/2016 FINDINGS: Right IJ catheter tip is in the cavoatrial  junction. The ET tube tip is above the carina. Aortic atherosclerosis is present. There are bilateral pleural effusions and pulmonary edema identified compatible with CHF. IMPRESSION: 1. Similar appearance of CHF pattern. Electronically Signed   By: Kerby Moors M.D.   On: 04/05/2016 10:35     STUDIES:  CXR 4/16 >> bilateral pulmonary edema, b/l pleural effusions, basilar atelectasis vs pneumonia CXR 4/18 >> low lung volumes, bilateral effusions, cardiomegaly  ECHO 4/17  >> LV inferior septal / apical akinesis, diffuse hypokinesis, mod dilation, mild LVH, EF 25%, severe AS, RA wnl, mild PR, mild TR, trivial pericardial effusion  CULTURES: Blood 4/16 >>  UC 4/16 >> none MRSA nasal 4/17  >> positive  ANTIBIOTICS: Vanc 4/16 >> Cefepime 4/16 >>  SIGNIFICANT EVENTS: 4/17  Admit, intubation for respiratory distress  LINES/TUBES: RIJ 4/16 >> ETT 4/16 >> Foley 4/16 >>  DISCUSSION: 55M with acute on chronic diastolic CHF (last EF 123456 in Feb 2017), moderate-severe AS, and possible sepsis secondary to pneumonia vs skin / soft tissue infection (bilateral heels). UA benign, no other pertinent history to suggest other source. He has an AKI and troponin leak. His BNP is markedly elevated. He also has a history of HTN, HLD, diabetes, hypothyroidism, COPD on Advair/DuoNebs and peripheral arterial disease.  ASSESSMENT / PLAN:  PULMONARY A: Acute hypoxemic respiratory failure - likely secondary to dCHF exacerbation and volume overload, although sepsis 2/2 pneumonia cannot be excluded Bilateral Pleural Effusions  Hx COPD P:   Extubate today - BiPAP prn   Empiric antibiotics with vanc/cefepime Q6 DuoNebs + Q3 PRN albuterol while intubated - once extubated resume home regimen Follow CXR  CARDIOVASCULAR A:  Troponin leak /inferior NSTEMI - improving Acute on chronic dCHF exacerbation Acute systolic failure- LVEF 123456 on ECHO (was 60% 2 months ago) Severe AS Known RBBB Hx Rheumatic /  Scarlet Fever P:  Trend troponin Agree with empiric heparin for ACS/NSTEMI for now Follow EKG ASA/Statin Hold antihypertensives, ACEI/ARB/Beta blocker due to hypotension and renal failure Heparin gtt  Lasix gtt as renal function permits Cardiology following- appreciate input.  Not a TAVR candidate.   RENAL A:   Acute on chronic kidney injury (Cr 3 from baseline ~1.6) Lactic acidosis Hyponatremia - likely hypervolemic hyponatremia Hypocalcemia - improving, corrects to 7.5, suspect secondary to renal disease and critical illness  Hypomagnesemia - resolved P:   D/C lasix gtt  Trend BMP / UOP  Trend lactate Trend calcium   GASTROINTESTINAL A:   Shock Liver vs Congestion with CHF - suspect shock liver Hx Dysphagia, Reflux, Esophageal Strictures P:   GI ppx Unable to place NGT under fluoro  Advance diet as tolerated once extubated   HEMATOLOGIC A:   Leukocytosis w/ left shift-  Improving  Chronic anemia P:  Trend  CBC Heparin gtt as above   INFECTIOUS A:   Sepsis 2/2 pneumonia vs Skin Soft Tissue Infection vs other Bilateral Heel Decubitus Ulcers - present on admission P:   Continue  Empiric antibiotics with vanc, cefepime PCT 1.99 > 1.67 Follow cultures Wound care for feet Trend PCT  ENDOCRINE A:   Hx diabetes Hx hypothyroidism P:   SSI Continue levothyroxine   NEUROLOGIC A:   Parkinsonian features and probable agitation at baseline P:   RASS goal: 0  Continue home Sinemet, Seroquel  Stop fentanyl for extubation     FAMILY  - Updates:   4/19- no family at bedside.  Phone discussion held with wife [name]) on 4/18 regarding current state of health.  Concerned with pattern of progression since admission.  CHF with worsening renal failure despite aggressive therapy.  Wife understands he may not survive this illness.  He previously had stated he "didn't want to be attached to machines".  Given his prior wishes and current illness, she agrees that CPR,  defib are not in his best interest.  She would like to give him ~ 48 hours to see if he makes any improvement and re-evaluate progression.  No CPR/Defib in the event of arrest.    - Inter-disciplinary family meet or Palliative Care meeting due by: 4/24   Noe Gens, NP-C [facility] Pgr: [phone] or if no answer [phone] April 17, 2016, 9:54 AM

## 2016-04-18 DEATH — deceased

## 2016-10-02 IMAGING — CR DG CHEST 1V PORT
1 series · 1 of 1 positions shown · non-contrast
Comparison: 01/15/2016

CLINICAL DATA: Acute on chronic left-sided chest pain, former
smoker

EXAM:
PORTABLE CHEST 1 VIEW

[AP]
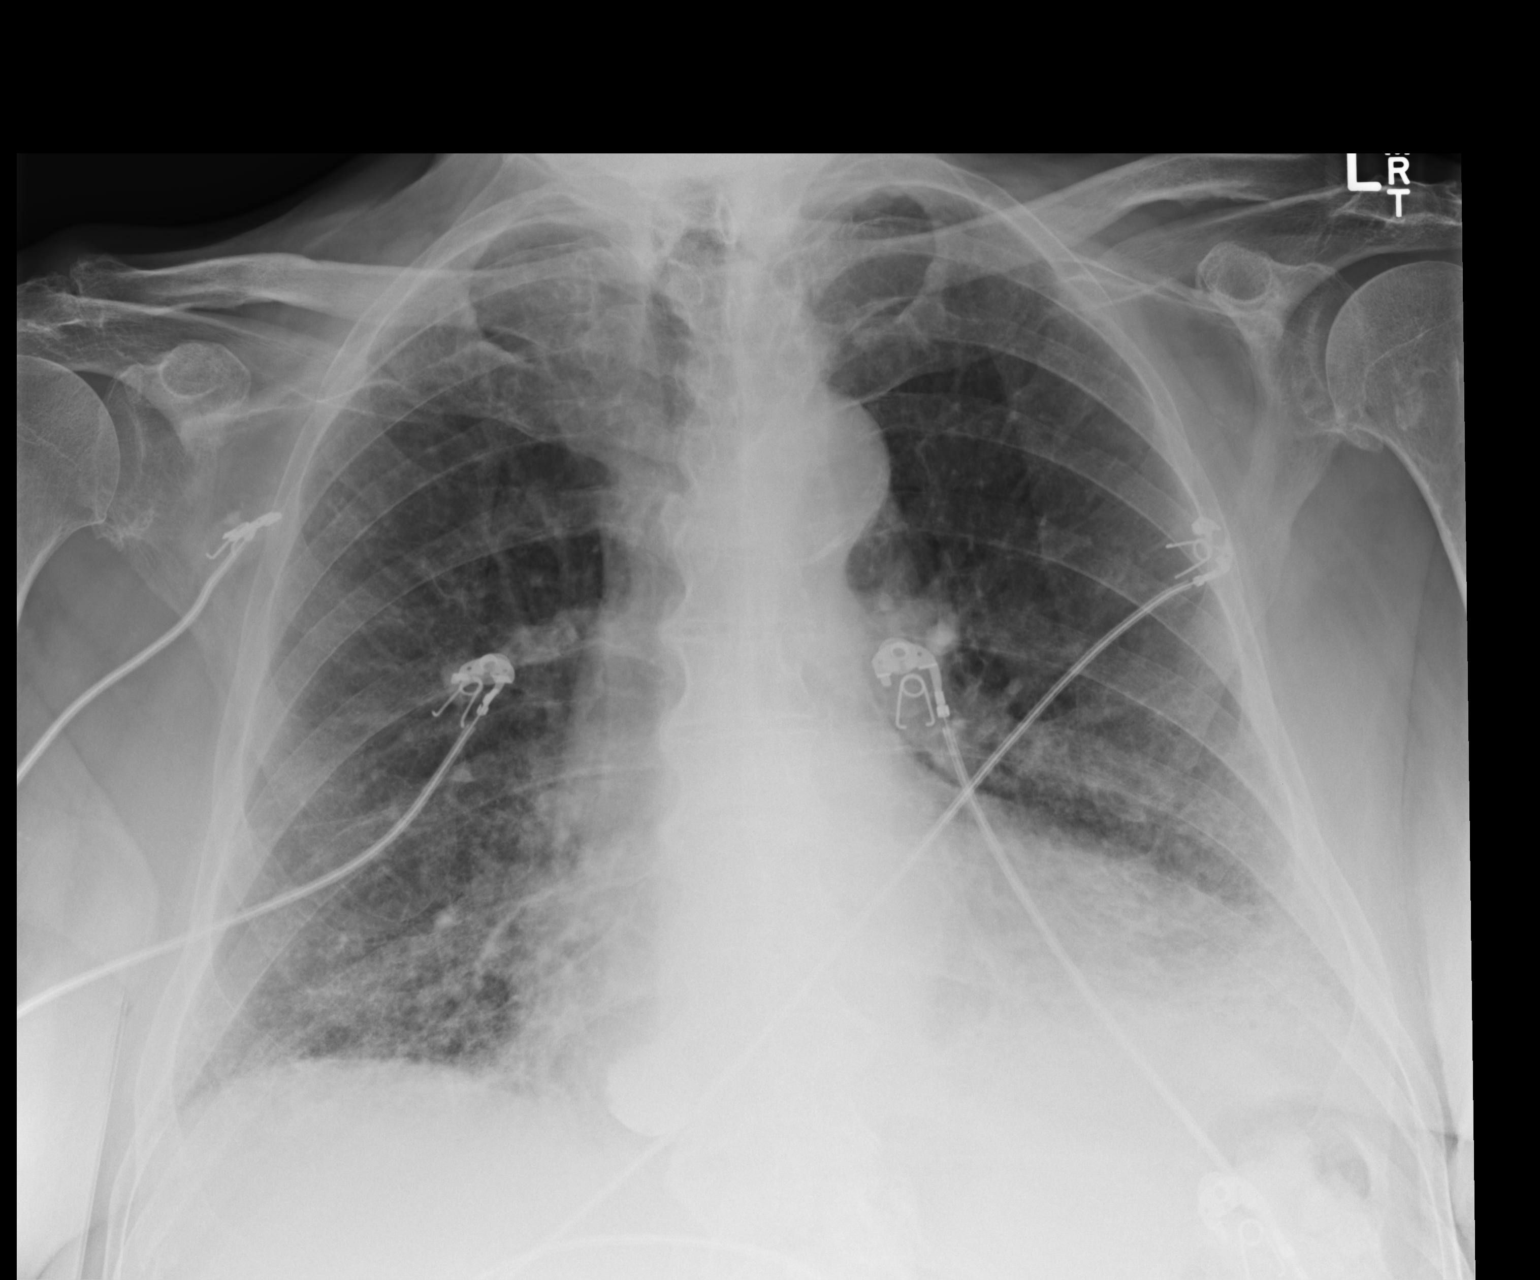

[1 of 1 positions shown; findings below may reference images not displayed]

FINDINGS: Moderate cardiac enlargement similar to prior study infiltrate left
lower lobe less severe when compared to the prior examination.
Infiltrate right lower lobe similar to prior study. Vascular pattern
normal. Bilateral shoulder arthropathy.
IMPRESSION: Findings consistent with persistent but improved left lower lobe
pneumonia. Also suspect right lower lobe pneumonia.

Hyper attenuation to the right of midline new the medial aspect of
the right diaphragm likely represents residual barium within distal
esophagus given some rotation of the patient. Patient had a barium
study performed 02/04/2016.

## 2016-10-06 IMAGING — DX DG CHEST 1V PORT
1 series · 1 of 1 positions shown · non-contrast
Comparison: 02/06/2016.

CLINICAL DATA: Pneumonia.

EXAM:
PORTABLE CHEST 1 VIEW

[chest ap]
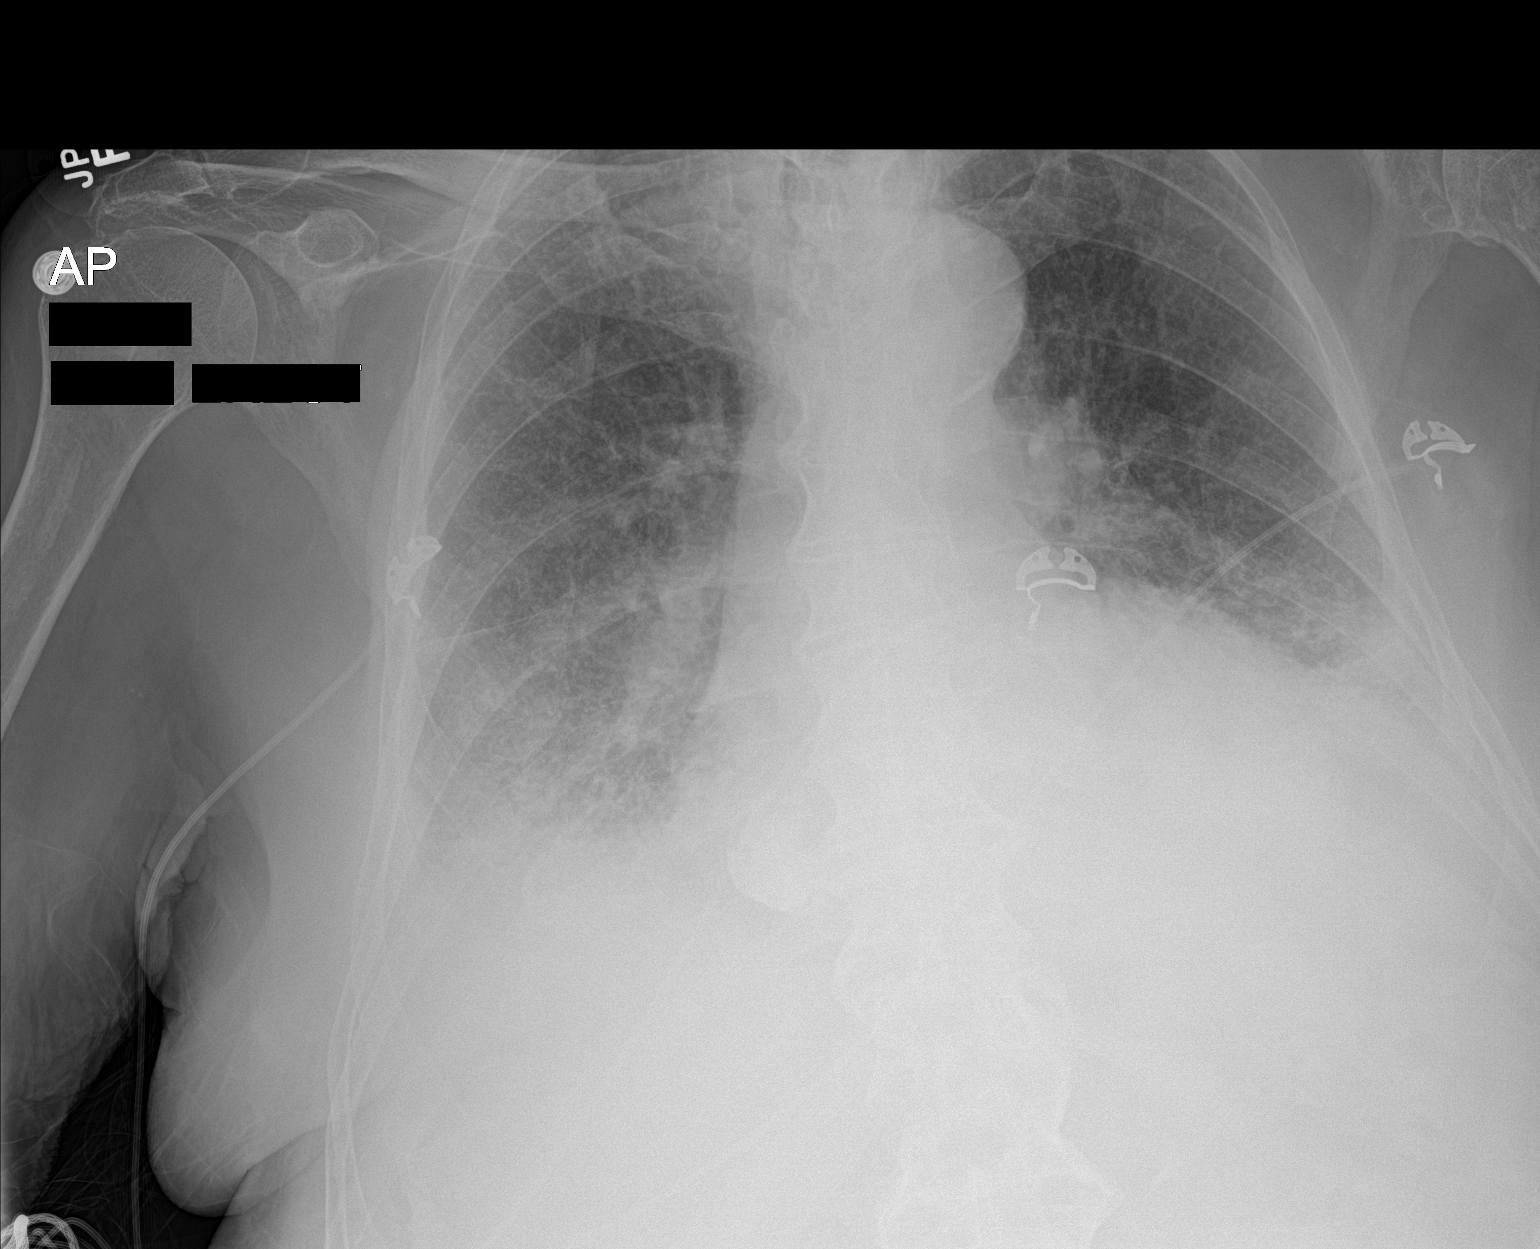

[1 of 1 positions shown; findings below may reference images not displayed]

FINDINGS: Mediastinum hilar structures normal. Cardiomegaly with bilateral
pulmonary interstitial prominence suggesting congestive heart
failure. Findings have progressed from prior exam. Underlying
pneumonia cannot be excluded. Bilateral pleural effusions noted. No
pneumothorax .
IMPRESSION: Congestive heart failure with bilateral interstitial edema, and
bilateral pleural effusions. Changes have worsened from prior exam .
Underlying pneumonia cannot be excluded.

## 2016-11-30 IMAGING — RF DG FLUORO RM 1-60 MIN
1 series · 1 of 1 positions shown · IV contrast (agent unspecified)
Comparison: None.

CLINICAL DATA: 87-year-old male for placement of nasogastric tube
which was unsuccessful on floor. Initial encounter.

EXAM:
FLOURO RM 1-60 MIN
CONTRAST:  None.
FLUOROSCOPY TIME:  Radiation Exposure Index (as provided by the
fluoroscopic device): 3.64 micro Gray
Fluoroscopy Time:  19 seconds.

[Series 1: run · 1 of 1 slices shown]
[im 1/1]
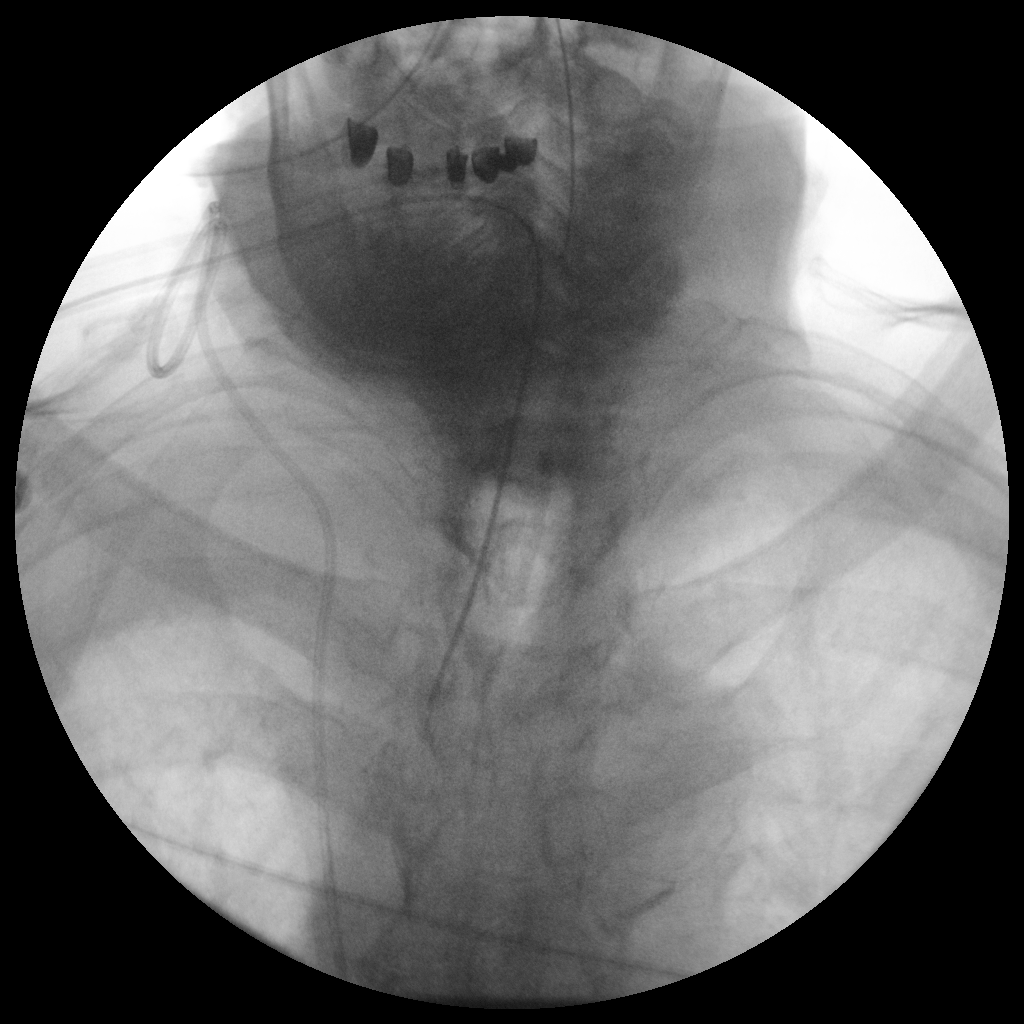

[1 of 1 positions shown; findings below may reference images not displayed]

FINDINGS: Attempted at placement nasogastric tube was unsuccessful.
Nasogastric tube could not be advanced beyond the cervical
esophagus.
IMPRESSION: Attempted at placement nasogastric tube was unsuccessful.
Nasogastric tube could not be advanced beyond the cervical
esophagus.
